# Patient Record
Sex: Female | Born: 1950
Health system: Southern US, Community
[De-identification: ages and names within clinical notes are randomized; demographics above are authoritative.]

## PROBLEM LIST (undated history)

## (undated) DIAGNOSIS — F419 Anxiety disorder, unspecified: Secondary | ICD-10-CM

## (undated) DIAGNOSIS — K760 Fatty (change of) liver, not elsewhere classified: Secondary | ICD-10-CM

## (undated) DIAGNOSIS — I440 Atrioventricular block, first degree: Secondary | ICD-10-CM

## (undated) DIAGNOSIS — R350 Frequency of micturition: Secondary | ICD-10-CM

## (undated) DIAGNOSIS — K8689 Other specified diseases of pancreas: Secondary | ICD-10-CM

## (undated) DIAGNOSIS — M1990 Unspecified osteoarthritis, unspecified site: Secondary | ICD-10-CM

## (undated) DIAGNOSIS — Z860101 Personal history of adenomatous and serrated colon polyps: Secondary | ICD-10-CM

## (undated) DIAGNOSIS — K219 Gastro-esophageal reflux disease without esophagitis: Secondary | ICD-10-CM

## (undated) DIAGNOSIS — Z87820 Personal history of traumatic brain injury: Secondary | ICD-10-CM

## (undated) DIAGNOSIS — I10 Essential (primary) hypertension: Secondary | ICD-10-CM

## (undated) DIAGNOSIS — Z87442 Personal history of urinary calculi: Secondary | ICD-10-CM

## (undated) DIAGNOSIS — I471 Supraventricular tachycardia, unspecified: Secondary | ICD-10-CM

## (undated) DIAGNOSIS — Z86010 Personal history of colonic polyps: Secondary | ICD-10-CM

## (undated) DIAGNOSIS — J45909 Unspecified asthma, uncomplicated: Secondary | ICD-10-CM

## (undated) DIAGNOSIS — R32 Unspecified urinary incontinence: Secondary | ICD-10-CM

## (undated) DIAGNOSIS — T7840XA Allergy, unspecified, initial encounter: Secondary | ICD-10-CM

## (undated) DIAGNOSIS — K862 Cyst of pancreas: Secondary | ICD-10-CM

## (undated) DIAGNOSIS — K863 Pseudocyst of pancreas: Secondary | ICD-10-CM

## (undated) DIAGNOSIS — Z9889 Other specified postprocedural states: Secondary | ICD-10-CM

## (undated) DIAGNOSIS — Z8673 Personal history of transient ischemic attack (TIA), and cerebral infarction without residual deficits: Secondary | ICD-10-CM

## (undated) DIAGNOSIS — K449 Diaphragmatic hernia without obstruction or gangrene: Secondary | ICD-10-CM

## (undated) DIAGNOSIS — R3915 Urgency of urination: Secondary | ICD-10-CM

## (undated) DIAGNOSIS — Z8719 Personal history of other diseases of the digestive system: Secondary | ICD-10-CM

## (undated) DIAGNOSIS — K649 Unspecified hemorrhoids: Secondary | ICD-10-CM

## (undated) DIAGNOSIS — N281 Cyst of kidney, acquired: Secondary | ICD-10-CM

## (undated) DIAGNOSIS — Z5189 Encounter for other specified aftercare: Secondary | ICD-10-CM

## (undated) DIAGNOSIS — Z87411 Personal history of vaginal dysplasia: Secondary | ICD-10-CM

## (undated) DIAGNOSIS — K625 Hemorrhage of anus and rectum: Secondary | ICD-10-CM

## (undated) HISTORY — DX: Unspecified urinary incontinence: R32

## (undated) HISTORY — DX: Allergy, unspecified, initial encounter: T78.40XA

## (undated) HISTORY — DX: Other specified diseases of pancreas: K86.89

## (undated) HISTORY — DX: Frequency of micturition: R35.0

## (undated) HISTORY — PX: CARDIAC ELECTROPHYSIOLOGY MAPPING AND ABLATION: SHX1292

## (undated) HISTORY — PX: OTHER SURGICAL HISTORY: SHX169

## (undated) HISTORY — DX: Encounter for other specified aftercare: Z51.89

## (undated) HISTORY — PX: SPINAL FUSION: SHX223

## (undated) HISTORY — PX: TRANSTHORACIC ECHOCARDIOGRAM: SHX275

## (undated) HISTORY — DX: Cyst of pancreas: K86.2

## (undated) HISTORY — PX: BREAST EXCISIONAL BIOPSY: SUR124

## (undated) HISTORY — DX: Fatty (change of) liver, not elsewhere classified: K76.0

---

## 1989-01-18 HISTORY — PX: NASAL SEPTUM SURGERY: SHX37

## 1994-01-18 HISTORY — PX: FOOT NEUROMA SURGERY: SHX646

## 1994-01-18 HISTORY — PX: VAGINAL HYSTERECTOMY: SUR661

## 1998-04-15 ENCOUNTER — Other Ambulatory Visit: Admission: RE | Admit: 1998-04-15 | Discharge: 1998-04-15 | Payer: Self-pay | Admitting: *Deleted

## 1998-06-17 ENCOUNTER — Other Ambulatory Visit: Admission: RE | Admit: 1998-06-17 | Discharge: 1998-06-17 | Payer: Self-pay | Admitting: Internal Medicine

## 1998-11-07 ENCOUNTER — Encounter: Payer: Self-pay | Admitting: Family Medicine

## 1998-11-07 ENCOUNTER — Ambulatory Visit (HOSPITAL_COMMUNITY): Admission: RE | Admit: 1998-11-07 | Discharge: 1998-11-07 | Payer: Self-pay | Admitting: Family Medicine

## 1999-02-05 ENCOUNTER — Encounter: Admission: RE | Admit: 1999-02-05 | Discharge: 1999-02-05 | Payer: Self-pay | Admitting: Family Medicine

## 1999-02-05 ENCOUNTER — Encounter: Payer: Self-pay | Admitting: Family Medicine

## 1999-02-11 ENCOUNTER — Encounter: Payer: Self-pay | Admitting: Family Medicine

## 1999-02-11 ENCOUNTER — Encounter: Admission: RE | Admit: 1999-02-11 | Discharge: 1999-02-11 | Payer: Self-pay | Admitting: Family Medicine

## 1999-05-21 ENCOUNTER — Other Ambulatory Visit: Admission: RE | Admit: 1999-05-21 | Discharge: 1999-05-21 | Payer: Self-pay | Admitting: *Deleted

## 2000-07-08 ENCOUNTER — Emergency Department (HOSPITAL_COMMUNITY): Admission: EM | Admit: 2000-07-08 | Discharge: 2000-07-08 | Payer: Self-pay | Admitting: Emergency Medicine

## 2000-07-08 ENCOUNTER — Encounter: Payer: Self-pay | Admitting: Emergency Medicine

## 2004-05-27 ENCOUNTER — Ambulatory Visit (HOSPITAL_BASED_OUTPATIENT_CLINIC_OR_DEPARTMENT_OTHER): Admission: RE | Admit: 2004-05-27 | Discharge: 2004-05-27 | Payer: Self-pay | Admitting: *Deleted

## 2004-05-31 ENCOUNTER — Ambulatory Visit: Payer: Self-pay | Admitting: Internal Medicine

## 2004-12-23 ENCOUNTER — Other Ambulatory Visit: Admission: RE | Admit: 2004-12-23 | Discharge: 2004-12-23 | Payer: Self-pay | Admitting: *Deleted

## 2005-02-22 ENCOUNTER — Encounter: Admission: RE | Admit: 2005-02-22 | Discharge: 2005-02-22 | Payer: Self-pay | Admitting: Emergency Medicine

## 2005-05-11 ENCOUNTER — Ambulatory Visit: Payer: Self-pay | Admitting: Gastroenterology

## 2005-05-17 ENCOUNTER — Ambulatory Visit: Payer: Self-pay | Admitting: Gastroenterology

## 2005-06-04 ENCOUNTER — Ambulatory Visit: Payer: Self-pay | Admitting: Gastroenterology

## 2005-06-16 ENCOUNTER — Ambulatory Visit (HOSPITAL_COMMUNITY): Admission: RE | Admit: 2005-06-16 | Discharge: 2005-06-16 | Payer: Self-pay | Admitting: Gastroenterology

## 2005-06-21 ENCOUNTER — Ambulatory Visit (HOSPITAL_COMMUNITY): Admission: RE | Admit: 2005-06-21 | Discharge: 2005-06-21 | Payer: Self-pay | Admitting: Surgery

## 2005-06-28 ENCOUNTER — Ambulatory Visit: Payer: Self-pay | Admitting: Gastroenterology

## 2005-09-01 ENCOUNTER — Ambulatory Visit (HOSPITAL_COMMUNITY): Admission: RE | Admit: 2005-09-01 | Discharge: 2005-09-03 | Payer: Self-pay | Admitting: Surgery

## 2006-11-23 ENCOUNTER — Ambulatory Visit (HOSPITAL_COMMUNITY): Admission: RE | Admit: 2006-11-23 | Discharge: 2006-11-23 | Payer: Self-pay | Admitting: Family Medicine

## 2006-12-06 ENCOUNTER — Encounter: Payer: Self-pay | Admitting: Internal Medicine

## 2007-03-14 DIAGNOSIS — K219 Gastro-esophageal reflux disease without esophagitis: Secondary | ICD-10-CM | POA: Insufficient documentation

## 2007-03-14 DIAGNOSIS — I10 Essential (primary) hypertension: Secondary | ICD-10-CM | POA: Insufficient documentation

## 2007-03-16 ENCOUNTER — Ambulatory Visit: Payer: Self-pay | Admitting: Internal Medicine

## 2007-03-16 DIAGNOSIS — E785 Hyperlipidemia, unspecified: Secondary | ICD-10-CM | POA: Insufficient documentation

## 2007-03-16 DIAGNOSIS — R05 Cough: Secondary | ICD-10-CM

## 2007-07-31 ENCOUNTER — Telehealth: Payer: Self-pay | Admitting: Gastroenterology

## 2007-07-31 DIAGNOSIS — Z860100 Personal history of colon polyps, unspecified: Secondary | ICD-10-CM | POA: Insufficient documentation

## 2007-07-31 DIAGNOSIS — K222 Esophageal obstruction: Secondary | ICD-10-CM

## 2007-07-31 DIAGNOSIS — K449 Diaphragmatic hernia without obstruction or gangrene: Secondary | ICD-10-CM | POA: Insufficient documentation

## 2007-08-01 ENCOUNTER — Ambulatory Visit: Payer: Self-pay | Admitting: Gastroenterology

## 2007-08-01 DIAGNOSIS — R197 Diarrhea, unspecified: Secondary | ICD-10-CM | POA: Insufficient documentation

## 2007-08-01 DIAGNOSIS — K625 Hemorrhage of anus and rectum: Secondary | ICD-10-CM

## 2007-08-01 LAB — CONVERTED CEMR LAB
ALT: 17 units/L (ref 0–35)
AST: 19 units/L (ref 0–37)
Albumin: 3.6 g/dL (ref 3.5–5.2)
BUN: 12 mg/dL (ref 6–23)
Basophils Relative: 0.7 % (ref 0.0–1.0)
Bilirubin, Direct: 0.1 mg/dL (ref 0.0–0.3)
CO2: 28 meq/L (ref 19–32)
Chloride: 105 meq/L (ref 96–112)
Creatinine, Ser: 0.8 mg/dL (ref 0.4–1.2)
Eosinophils Absolute: 0.3 10*3/uL (ref 0.0–0.7)
HCT: 39.5 % (ref 36.0–46.0)
Hemoglobin: 13.7 g/dL (ref 12.0–15.0)
Iron: 60 ug/dL (ref 42–145)
MCHC: 34.6 g/dL (ref 30.0–36.0)
MCV: 92.8 fL (ref 78.0–100.0)
Neutrophils Relative %: 53.1 % (ref 43.0–77.0)
RBC: 4.26 M/uL (ref 3.87–5.11)
Saturation Ratios: 19.7 % — ABNORMAL LOW (ref 20.0–50.0)
Total Bilirubin: 0.7 mg/dL (ref 0.3–1.2)
Total Protein: 6.1 g/dL (ref 6.0–8.3)
Vitamin B-12: 422 pg/mL (ref 211–911)
WBC: 6 10*3/uL (ref 4.5–10.5)

## 2007-08-03 ENCOUNTER — Encounter: Payer: Self-pay | Admitting: Gastroenterology

## 2007-08-04 ENCOUNTER — Encounter (INDEPENDENT_AMBULATORY_CARE_PROVIDER_SITE_OTHER): Payer: Self-pay | Admitting: *Deleted

## 2007-08-29 ENCOUNTER — Ambulatory Visit: Payer: Self-pay | Admitting: Gastroenterology

## 2008-09-06 ENCOUNTER — Ambulatory Visit (HOSPITAL_COMMUNITY): Admission: RE | Admit: 2008-09-06 | Discharge: 2008-09-06 | Payer: Self-pay | Admitting: Diagnostic Radiology

## 2009-05-14 ENCOUNTER — Emergency Department (HOSPITAL_COMMUNITY): Admission: EM | Admit: 2009-05-14 | Discharge: 2009-05-14 | Payer: Self-pay | Admitting: Emergency Medicine

## 2009-05-15 ENCOUNTER — Ambulatory Visit (HOSPITAL_COMMUNITY): Admission: RE | Admit: 2009-05-15 | Discharge: 2009-05-15 | Payer: Self-pay | Admitting: Emergency Medicine

## 2009-11-22 ENCOUNTER — Emergency Department (HOSPITAL_COMMUNITY): Admission: EM | Admit: 2009-11-22 | Discharge: 2009-11-22 | Payer: Self-pay | Admitting: Emergency Medicine

## 2009-11-22 ENCOUNTER — Encounter: Payer: Self-pay | Admitting: Internal Medicine

## 2009-12-17 ENCOUNTER — Encounter: Payer: Self-pay | Admitting: Internal Medicine

## 2009-12-18 ENCOUNTER — Encounter: Payer: Self-pay | Admitting: Internal Medicine

## 2009-12-30 ENCOUNTER — Encounter: Payer: Self-pay | Admitting: Internal Medicine

## 2010-01-13 ENCOUNTER — Encounter (INDEPENDENT_AMBULATORY_CARE_PROVIDER_SITE_OTHER): Payer: Self-pay | Admitting: *Deleted

## 2010-01-13 ENCOUNTER — Ambulatory Visit: Payer: Self-pay | Admitting: Internal Medicine

## 2010-01-13 ENCOUNTER — Encounter: Payer: Self-pay | Admitting: Internal Medicine

## 2010-01-13 DIAGNOSIS — I471 Supraventricular tachycardia: Secondary | ICD-10-CM

## 2010-01-14 ENCOUNTER — Ambulatory Visit
Admission: RE | Admit: 2010-01-14 | Discharge: 2010-01-14 | Payer: Self-pay | Source: Home / Self Care | Attending: Internal Medicine | Admitting: Internal Medicine

## 2010-01-14 LAB — CONVERTED CEMR LAB
Basophils Absolute: 0 10*3/uL (ref 0.0–0.1)
Basophils Relative: 0.8 % (ref 0.0–3.0)
Calcium: 8.8 mg/dL (ref 8.4–10.5)
Chloride: 107 meq/L (ref 96–112)
Eosinophils Absolute: 0.3 10*3/uL (ref 0.0–0.7)
Glucose, Bld: 81 mg/dL (ref 70–99)
HCT: 41.1 % (ref 36.0–46.0)
INR: 0.9 (ref 0.8–1.0)
MCHC: 33.8 g/dL (ref 30.0–36.0)
MCV: 95 fL (ref 78.0–100.0)
Monocytes Absolute: 0.6 10*3/uL (ref 0.1–1.0)
Neutrophils Relative %: 48.4 % (ref 43.0–77.0)
Platelets: 238 10*3/uL (ref 150.0–400.0)
Potassium: 4.1 meq/L (ref 3.5–5.1)
Prothrombin Time: 9.5 s — ABNORMAL LOW (ref 9.7–11.8)
RDW: 13.8 % (ref 11.5–14.6)

## 2010-01-15 ENCOUNTER — Encounter: Payer: Self-pay | Admitting: Internal Medicine

## 2010-01-18 DIAGNOSIS — Z87820 Personal history of traumatic brain injury: Secondary | ICD-10-CM

## 2010-01-18 HISTORY — DX: Personal history of traumatic brain injury: Z87.820

## 2010-01-21 ENCOUNTER — Ambulatory Visit (HOSPITAL_COMMUNITY)
Admission: RE | Admit: 2010-01-21 | Discharge: 2010-01-22 | Payer: Self-pay | Source: Home / Self Care | Attending: Internal Medicine | Admitting: Internal Medicine

## 2010-02-05 NOTE — Discharge Summary (Signed)
[name], Daniel NO.:  [id number]  MEDICAL RECORD NO.:  [id number]          PATIENT TYPE:  OIB  LOCATION:  6526                         FACILITY:  [facility]  PHYSICIAN:  Duke Salvia, MD, FACCDATE OF BIRTH:  Aug 08, 1950  DATE OF ADMISSION:  01/21/2010 DATE OF DISCHARGE:  01/22/2010                              DISCHARGE SUMMARY   PRIMARY CARE PHYSICIAN:  Brett Canales A. Cleta Alberts, MD  PRIMARY CARDIOLOGIST:  Cristy Hilts. Jacinto Halim, MD  ELECTROPHYSIOLOGIST:  Duke Salvia, MD, [facility]  PRIMARY DIAGNOSIS:  Supraventricular tachycardia.  SECONDARY DIAGNOSES: 1. History of colon polyp. 2. Esophageal stricture. 3. Hiatal hernia. 4. Hyperlipidemia. 5. Hypertension. 6. Gastroesophageal disease.  ALLERGIES:  The patient has no known drug allergies.  PROCEDURES THIS ADMISSION:  Electrophysiology study and radiofrequency catheter ablation of AV nodal reentrant tachycardia by Dr. Graciela Husbands on January 21, 2010.  The study demonstrated inducible AV nodal reentrant tachycardia with successful slow pathway modification performed.  The patient had no inducible arrhythmias postablation and had no early apparent complications.  BRIEF HISTORY OF PRESENT ILLNESS:  Pamela Daniel is a 60 year old female with an 8 year history of recurrent abrupt onset of tachy palpitations.  These have been associated with presyncope and shortness of breath, but no chest discomfort.  They are FROG positive. She has in the past been unable to terminate them with Valsalva.  She was evaluated by Dr. Graciela Husbands in the outpatient setting for treatment options.  Her arrhythmias have not previously been suppressed adequately with AV nodal blocking agents.  Catheter ablation was discussed with the patient.  Risks, benefits, and alternatives were reviewed and the patient wished to proceed.  HOSPITAL COURSE:  The patient was admitted on January 21, 2010, for planned ablation of supraventricular tachycardia.  This was carried  out by Dr. Graciela Husbands with details as outlined above.  The patient was monitored on telemetry overnight, which demonstrated sinus rhythm.  The groin incision on the morning of the procedure was without hematoma or bruit. The patient's Bystolic was discontinued after her ablation.  Dr. Graciela Husbands examined the patient and considered her stable for discharge.  FOLLOWUP APPOINTMENTS: 1. Dr. Graciela Husbands on March 16, 2010. 2. Dr. Jacinto Halim as scheduled. 3. Dr. Cleta Alberts as scheduled.  DISCHARGE INSTRUCTIONS: 1. Increase activity slowly. 2. No driving for 2 days. 3. Follow a low-sodium, heart-healthy diet. 4. Keep her incision clean and dry.  DISCHARGE MEDICATIONS: 1. Prilosec 20 mg daily. 2. Citalopram 20 mg daily. 3. Xanax 0.25 mg one-half tablet as needed. 4. Flexeril 10 mg as needed.  Of note, patient's Bystolic was discontinued this admission secondary to successful ablation of AVNRT.  DISPOSITION:  The patient was seen and examined by Dr. Graciela Husbands on January 22, 2010, and considered stable for discharge.  DURATION OF DISCHARGE ENCOUNTER:  Thirty-five minutes.     Gypsy Balsam, RN,BSN   ______________________________ Duke Salvia, MD, [facility]    AS/MEDQ  D:  01/22/2010  T:  01/23/2010  Job:  [id number]  cc:   Cristy Hilts. Jacinto Halim, MD [place] Cleta Alberts, M.D.  Electronically Signed by Gypsy Balsam RNBSN on 01/25/2010 05:56:14 PM Electronically Signed by Sherryl Manges  MD [facility] on 02/05/2010 01:40:12 PM

## 2010-02-08 ENCOUNTER — Encounter: Payer: Self-pay | Admitting: Family Medicine

## 2010-02-19 NOTE — Assessment & Plan Note (Signed)
Summary: eval for ablation/mt   Visit Type:  NP Primary Joshu Furukawa:  Ailene Ards MD  CC:  no complaints.  History of Present Illness: Mrs. Keniston is seen at the request of Dr. Jacinto Halim for consideration of catheter ablation of documented supraventricular tachycardia.  The patient has an 8 year history of recurrent abrupt onset/offset tachypalpitations. They're associated with presyncope shortness of breath but no chest discomfort. They are frog positive. She is able to terminate them with Valsalva. They last typically 2-15 minutes. One of these was documented with a heart rate of 300 ms or so.  She is currently taking diastolic but has taken atenolol and metoprolol in the past.  She had an extensive cardiac workup including ultrasound and stress tests under the care of Dr. Dorma Russell which were normal. She also underwent a recent echo by Dr. Ottis Stain that was normal.  She has long-standing exercise intolerance and daytime somnolence. She has very poor sleep history. Apparently she underwent sleep study a couple of years ago that was unrevealing.  Problems Prior to Update: 1)  Svt/ Psvt/ Pat  (ICD-427.0) 2)  Rectal Bleeding  (ICD-569.3) 3)  Diarrhea  (ICD-787.91) 4)  Carcinoma, Colon, Family Hx  (ICD-V16.0) 5)  Colonic Polyps, Hx of  (ICD-V12.72) 6)  Esophageal Stricture  (ICD-530.3) 7)  Hiatal Hernia  (ICD-553.3) 8)  Hyperlipidemia  (ICD-272.4) 9)  Cough  (ICD-786.2) 10)  Hypertension  (ICD-401.9) 11)  G E R D  (ICD-530.81)  Current Medications (verified): 1)  Prilosec 20 Mg Cpdr (Omeprazole) .... Once Daily 2)  Bystolic 5 Mg Tabs (Nebivolol Hcl) .... Once Daily 3)  Citalopram Hydrobromide 20 Mg Tabs (Citalopram Hydrobromide) .... Once Daily 4)  Xanax 0.25 Mg Tabs (Alprazolam) .... 1/2 Tablet As Needed 5)  Flexeril 10 Mg Tabs (Cyclobenzaprine Hcl) .... As Needed  Allergies (verified): No Known Drug Allergies  Past History:  Past Surgical History: Last updated: 08/01/2007 status post  remote sinus surgery in 1991 Status post hysterectomy 1996 Status post lumpectomy benign remote NF 2007 anal fissurectomy Tubal Ligation previous fundoplication and subsequent microscopic takedown repair of in August of 2007 by Dr. Daphine Deutscher.  Family History: Last updated: 08/01/2007 emphysema father, chewed tobacco allergies mother, lots of coughing and sinus  cancer 3 brother lung, bone colon Family History of Colon Cancer: brother,mother Family History of Diabetes: grandfather Family History of Heart Disease: PSVT, mother  Social History: Last updated: 08/01/2007 never smoked Occupation: Engineer, site Patient has never smoked.  Alcohol Use - yes Daily Caffeine Use Illicit Drug Use - no  Risk Factors: Alcohol Use: <1 (08/01/2007) Caffeine Use: 2 (08/01/2007)  Risk Factors: Smoking Status: never (08/01/2007)  Past Medical History: G E R D positive endoscopy 11/23/94 Dr. Russella Dar Hypertension Hyperlipidemia depression Verne Spurr disturbance  Review of Systems       full review of systems was negative apart from a history of present illness and past medical history.   Vital Signs:  Patient profile:   60 year old female Height:      60 inches Weight:      163.25 pounds BMI:     32.00 Pulse rate:   64 / minute BP sitting:   122 / 78  (left arm) Cuff size:   regular  Vitals Entered By: Caralee Ates CMA (January 13, 2010 12:13 PM)  Physical Exam  General:  Alert and oriented  obese middle the older Caucasian female appearing her stated age in no acute distress. HEENT  normal . Neck veins were flat; carotids  brisk and full without bruits. No lymphadenopathy. Back without kyphosis. Lungs clear. Heart sounds regular without murmurs or gallops. PMI nondisplaced. Abdomen soft with active bowel sounds without midline pulsation or hepatomegaly. Femoral pulses and distal pulses intact. Extremities were without clubbing cyanosis or edemaSkin warm and dry. Neurological exam  grossly normal; affect normal    Impression & Recommendations:  Problem # 1:  SVT/ PSVT/ PAT (ICD-427.0) the patient has documented supraventricular tachycardia probably AV nodal reentry by history. We have discussed treatment options including antiarrhythmic drugs alternative AV nodal blocking agents as well as catheter ablation. We discussed the potential benefits as well as risks including but not limited to death perforation and heart block requiring pacemaker implantation. She understands these risks and would like to proceed.  The following medications were removed from the medication list:    Toprol Xl 25 Mg Tb24 (Metoprolol succinate) .Marland Kitchen... Take one daily Her updated medication list for this problem includes:    Bystolic 5 Mg Tabs (Nebivolol hcl) ..... Once daily  Patient Instructions: 1)  Your physician recommends that you continue on your current medications as directed. Please refer to the Current Medication list given to you today. 2)  Your physician has recommended that you have an ablation.  Catheter ablation is a medical procedure used to treat some cardiac arrhythmias (irregular heartbeats). During catheter ablation, a long, thin, flexible tube is put into a blood vessel in your groin (upper thigh), or neck. This tube is called an ablation catheter. It is then guided to your heart through the blood vessel. Radiofrequency waves destroy small areas of heart tissue where abnormal heartbeats may cause an arrhythmia to start.  Please see the instruction sheet given to you today.

## 2010-02-19 NOTE — Letter (Signed)
Summary: ELectrophysiology/Ablation Procedure Instructions  [employer], Main Office  [address]   [place], [place] [address]   Phone: [phone]  Fax: [phone]     Electrophysiology/Ablation Procedure Instructions    You are scheduled for a(n) ablation  on January 21, 2010 at 1030 am with Dr. Graciela Husbands.  1.  Please come to the Short Stay Center at [facility] at 8:30 am on the day of your procedure.  2.  Come prepared to stay overnight.   Please bring your insurance cards and a list of your medications.  3.  Come to the [place] office on January 14, 2010 at 8:30am for lab work.  The lab at [facility] is open from 8:30 AM to 1:30 PM and 2:30 PM to 5:00 PM.  The lab at [facility] is open from 7:30 AM to 5:30 PM.  You do not have to be fasting.  4.  Do not have anything to eat or drink after midnight the night before your procedure.  5.  Take  your medications with a small amount of water.  6.  Educational material received:  _x____ Ablation   * Occasionally, EP studies and ablations can become lengthy.  Please make your family aware of this before your procedure starts.  Average time ranges from 2-8 hours for EP studies/ablations.  Your physician will locate your family after the procedure with the results.  * If you have any questions after you get home, please call the office at [phone].  Claris Gladden, RN

## 2010-02-19 NOTE — Letter (Signed)
Summary: [facility]  [facility]   Imported By: Marylou Mccoy 02/10/2010 16:15:08  _____________________________________________________________________  External Attachment:    Type:   Image     Comment:   External Document

## 2010-03-16 ENCOUNTER — Encounter: Payer: Self-pay | Admitting: Internal Medicine

## 2010-03-17 ENCOUNTER — Encounter: Payer: Self-pay | Admitting: Internal Medicine

## 2010-03-31 LAB — POCT CARDIAC MARKERS: Myoglobin, poc: 49.4 ng/mL (ref 12–200)

## 2010-03-31 LAB — BASIC METABOLIC PANEL
CO2: 30 mEq/L (ref 19–32)
GFR calc Af Amer: >60 mL/min (ref >60)
Glucose, Bld: 102 mg/dL — ABNORMAL HIGH (ref 70–99)

## 2010-03-31 LAB — DIFFERENTIAL
Basophils Relative: 1 % (ref 0–1)
Eosinophils Absolute: 0.3 10*3/uL (ref 0.0–0.7)
Lymphocytes Relative: 38 % (ref 12–46)
Monocytes Absolute: 0.5 10*3/uL (ref 0.1–1.0)
Neutrophils Relative %: 49 % (ref 43–77)

## 2010-03-31 LAB — URINALYSIS, ROUTINE W REFLEX MICROSCOPIC
Bilirubin Urine: NEGATIVE
Ketones, ur: NEGATIVE mg/dL
Nitrite: NEGATIVE
Specific Gravity, Urine: 1.011 (ref 1.005–1.030)
Urobilinogen, UA: 0.2 mg/dL (ref 0.0–1.0)
pH: 6.5 (ref 5.0–8.0)

## 2010-03-31 LAB — CBC
Hemoglobin: 14.8 g/dL (ref 12.0–15.0)
MCHC: 33.5 g/dL (ref 30.0–36.0)
Platelets: 221 10*3/uL (ref 150–400)
RBC: 4.7 MIL/uL (ref 3.87–5.11)
RDW: 13 % (ref 11.5–15.5)

## 2010-04-07 LAB — POCT I-STAT, CHEM 8
BUN: 10 mg/dL (ref 6–23)
Calcium, Ion: 1.13 mmol/L (ref 1.12–1.32)
Chloride: 107 meq/L (ref 96–112)
Creatinine, Ser: 0.6 mg/dL (ref 0.4–1.2)
Glucose, Bld: 88 mg/dL (ref 70–99)
HCT: 43 % (ref 36.0–46.0)
Hemoglobin: 14.6 g/dL (ref 12.0–15.0)
Potassium: 4 meq/L (ref 3.5–5.1)
Sodium: 141 meq/L (ref 135–145)
TCO2: 28 mmol/L (ref 0–100)

## 2010-04-07 LAB — POCT CARDIAC MARKERS
CKMB, poc: <1 ng/mL — ABNORMAL LOW (ref 1.0–8.0)
Myoglobin, poc: 38.4 ng/mL (ref 12–200)
Troponin i, poc: <0.05 ng/mL (ref 0.00–0.09)

## 2010-04-07 LAB — D-DIMER, QUANTITATIVE: D-Dimer, Quant: <0.22 ug/mL-FEU (ref 0.00–0.48)

## 2010-05-28 ENCOUNTER — Emergency Department (HOSPITAL_COMMUNITY)
Admission: EM | Admit: 2010-05-28 | Discharge: 2010-05-28 | Disposition: A | Discharge status: Home / Self Care | Payer: 59 | Attending: Orthopedic Surgery | Admitting: Orthopedic Surgery

## 2010-05-28 DIAGNOSIS — F411 Generalized anxiety disorder: Secondary | ICD-10-CM | POA: Insufficient documentation

## 2010-05-28 DIAGNOSIS — I10 Essential (primary) hypertension: Secondary | ICD-10-CM | POA: Insufficient documentation

## 2010-05-28 DIAGNOSIS — R5381 Other malaise: Secondary | ICD-10-CM | POA: Insufficient documentation

## 2010-05-28 DIAGNOSIS — J45909 Unspecified asthma, uncomplicated: Secondary | ICD-10-CM | POA: Insufficient documentation

## 2010-05-28 DIAGNOSIS — R002 Palpitations: Secondary | ICD-10-CM | POA: Insufficient documentation

## 2010-05-28 DIAGNOSIS — Z79899 Other long term (current) drug therapy: Secondary | ICD-10-CM | POA: Insufficient documentation

## 2010-05-28 LAB — POCT CARDIAC MARKERS
CKMB, poc: <1 ng/mL — ABNORMAL LOW (ref 1.0–8.0)
Troponin i, poc: <0.05 ng/mL (ref 0.00–0.09)

## 2010-05-28 LAB — POCT I-STAT, CHEM 8
BUN: 7 mg/dL (ref 6–23)
Calcium, Ion: 1.15 mmol/L (ref 1.12–1.32)
Chloride: 106 mEq/L (ref 96–112)
Glucose, Bld: 93 mg/dL (ref 70–99)
HCT: 44 % (ref 36.0–46.0)
Potassium: 4.1 mEq/L (ref 3.5–5.1)

## 2010-06-05 NOTE — Procedures (Signed)
NAME:  Pamela Daniel, Pamela Daniel             ACCOUNT NO.:  [id number]   MEDICAL RECORD NO.:  [id number]          PATIENT TYPE:  OUT   LOCATION:  SLEEP CENTER                 FACILITY:  [facility]   PHYSICIAN:  Clinton D. Maple Hudson, M.D. DATE OF BIRTH:  [birth year]   DATE OF STUDY:  05/27/2004                              NOCTURNAL POLYSOMNOGRAM   REFERRING PHYSICIAN:  Onalee Hua L. Reed Breech, M.D.   INDICATIONS FOR STUDY:  Hypersomnia with sleep apnea. Epworth sleepiness  score 11/24, BMI 31, weight 161 pounds.   SLEEP ARCHITECTURE:  Total sleep time 489 minutes with sleep efficiency of  87%. Stage I was 2%, stage II was 69%, stages III and IV were absent, REM  was 16% of total sleep time. Sleep latency was 17 minutes. REM latency 158  minutes. Awake after sleep onset 49 minutes. Arousal index 5.   RESPIRATORY DATA:  Respiratory disturbance index (RDI, AHI) 4.6 obstructive  events per hour which is within the upper limits of adult normal (5 per  hour). There was 1 obstructive apnea, 2 mixed apneas, and 31 hypopneas. The  events were not positional. REM RDI 8.6. There were insufficient events to  permit use of CPAP titration by protocol  on this study night.   OXYGEN DATA:  Light to moderate snoring with oxygen desaturation to a nadir  of 82%.  Mean oxygen saturation through this study was 94% on room air.   CARDIAC DATA:  Normal cardiac rhythm.   MOVEMENT/PARASOMNIA:  Rare leg jerk with insignificant effect on sleep.  Several bathroom trips.   IMPRESSION/RECOMMENDATIONS:  1.  Several brief, but nonspecific awakenings and nocturia.  2.  Occasional sleep disordered breathing events, RDI of 4.6 per hour,      within the upper limits of normal per adult. No specific therapy      indicated.      CDY/MEDQ  D:  05/31/2004 14:05:03  T:  05/31/2004 19:26:04  Job:  [id number]

## 2010-06-05 NOTE — Op Note (Signed)
NAMEMarland Daniel  RALEY, NOVICKI NO.:  [id number]   MEDICAL RECORD NO.:  [id number]          PATIENT TYPE:  OUT   LOCATION:  XRAY                         FACILITY:  [facility]   PHYSICIAN:  Vania Rea. Jarold Motto, M.D. [facility] OF BIRTH:  Dec 06, 1950   DATE OF PROCEDURE:  06/28/2005  DATE OF DISCHARGE:  06/21/2005                                 OPERATIVE REPORT   PROCEDURE PERFORMED:  Esophageal manometry.   1.  Upper esophageal sphincter.  There is normal coordination between      pharyngeal contractions and cricopharyngeal relaxation.  2.  Lower esophageal sphincter pressure.  Mean pressure is 15.6 mmHg with      normal relaxation with swallowing.  Normal in our lab is 15 to 35 mmHg.  3.  Motility.  There area normal peristaltic waves throughout the length of      the esophagus to wet and dry swallows.  Mean amplitude of contractions      is normal at 117 mmHg.   ASSESSMENT:  This is a  normal esophageal manometry except for borderline  incompetent lower esophageal sphincter.  I see no contraindication to  fundoplication surgery because of her refractory reflux symptoms and a  history of peptic stricture of her esophagus.      Vania Rea. Jarold Motto, M.D. [facility]  Electronically Signed     DRP/MEDQ  D:  06/28/2005  T:  06/28/2005  Job:  [id number]   cc:   Thornton Park. Daphine Deutscher, MD  [address]  [place]  [place] [address]

## 2010-06-05 NOTE — Op Note (Signed)
Pamela Daniel, Pamela Daniel             ACCOUNT NO.:  [id number]   MEDICAL RECORD NO.:  [id number]          PATIENT TYPE:  OIB   LOCATION:  0098                         FACILITY:  [facility]   PHYSICIAN:  Thornton Park. Daphine Deutscher, MD  DATE OF BIRTH:  02-23-1950   DATE OF PROCEDURE:  09/01/2005  DATE OF DISCHARGE:                                 OPERATIVE REPORT   PREOPERATIVE DIAGNOSIS:  Gastroesophageal reflux with sliding hiatal hernia.   POSTOPERATIVE DIAGNOSIS:  Gastroesophageal reflux with sliding hiatal  hernia.   PROCEDURE:  Laparoscopic takedown and repair of hiatal hernia and Nissen  fundoplication.   SURGEON:  Luretha Murphy, M.D.   ASSISTANT:  Marca Ancona, M.D.   ANESTHESIA:  General endotracheal.   DESCRIPTION OF PROCEDURE:  Pamela Daniel was taken to room 1 on Wednesday,  09/01/2005 given general anesthesia.  The abdomen was prepped with  chlorhexidine and draped sterilely.  Access to the abdomen was gained  through the left upper quadrant using OptiVu technique without difficulty.  The abdomen was insufflated and the usual trocar placement was made to allow  for 5 mm upper midline for the [facility] liver retractor and two 11s on the  right side and one below the umbilicus.  There were no adhesions we  encountered there in the front from her previous tubal ligation.   The esophagogastric junction was exposed and you could see a little hole  went up into the chest where there was a small sliding hiatal hernia.  I  went to the pars flaccida region with a Harmonic scalpel and went across  anteriorly and then to the left side and the left crus.   Next, I took down short gastrics and carried this up to the left  hemidiaphragm.  I worked from behind, preserving the posterior vagus and  freed up the distal portion of the esophagus.  With a Penrose around the  stomach I was able to mobilize the esophagus and get it down in the abdomen.  With that down in the abdomen, I repaired the  diaphragm with two suture  closure using pledgeted sutures of  Surgidac in the auto suture device.  This closed this nicely.  Following this I then passed a lighted 50 bougie  to make sure it was a nice snug closure of the hiatus.  I withdrew that  momentarily and brought around a wrap of the upper portion of the stomach.  There was also a little area that had a little serosal tear that I oversewed  with a simple suture and it was up near the cardia.  With the wrap pulled  behind, I then found a spot on the patient's left side that was contiguous  and did a shoe shine maneuver.  I then passed the lighted 50 back down into  the stomach and using that as a guide, I sutured the wrap with three sutures  using intracorporeal ties using the Surgidac.  A clip was placed on the  middle suture.  The patient seemed to tolerate this procedure well.  There  was very little bleeding.  The abdomen was surveyed  and there were no other  bleeding  areas disturbed.  Port sites were injected with 0.5% Marcaine.  The wounds  were closed with 4-0 Vicryl with benzoin and Steri-Strips.  The patient  tolerated the procedure well and was taken to the recovery room in  satisfactory condition.      Thornton Park Daphine Deutscher, MD  Electronically Signed     MBM/MEDQ  D:  09/01/2005  T:  09/01/2005  Job:  [id number]   cc:   Onalee Hua L. Reed Breech, M.D.  Fax: [phone]   Vania Rea. Jarold Motto, MD, FACG, FACP  [address]  [place]  [place] [address]

## 2010-10-27 LAB — BUN: BUN: 9

## 2010-10-27 LAB — CREATININE, SERUM
Creatinine, Ser: 0.79
GFR calc non Af Amer: >60

## 2010-12-30 ENCOUNTER — Ambulatory Visit (INDEPENDENT_AMBULATORY_CARE_PROVIDER_SITE_OTHER): Payer: 59

## 2010-12-30 DIAGNOSIS — R05 Cough: Secondary | ICD-10-CM

## 2010-12-30 DIAGNOSIS — R0602 Shortness of breath: Secondary | ICD-10-CM

## 2011-01-22 ENCOUNTER — Other Ambulatory Visit (INDEPENDENT_AMBULATORY_CARE_PROVIDER_SITE_OTHER): Payer: 59

## 2011-01-22 DIAGNOSIS — D7289 Other specified disorders of white blood cells: Secondary | ICD-10-CM

## 2011-04-22 ENCOUNTER — Ambulatory Visit: Payer: 59

## 2011-04-22 ENCOUNTER — Ambulatory Visit (INDEPENDENT_AMBULATORY_CARE_PROVIDER_SITE_OTHER): Payer: 59 | Admitting: Family Medicine

## 2011-04-22 VITALS — BP 137/83 | HR 72 | Temp 97.5°F | Resp 16 | Ht 60.0 in | Wt 173.0 lb

## 2011-04-22 DIAGNOSIS — S060X0A Concussion without loss of consciousness, initial encounter: Secondary | ICD-10-CM

## 2011-04-22 DIAGNOSIS — S069X0A Unspecified intracranial injury without loss of consciousness, initial encounter: Secondary | ICD-10-CM

## 2011-04-22 DIAGNOSIS — S0990XA Unspecified injury of head, initial encounter: Secondary | ICD-10-CM

## 2011-04-22 NOTE — Progress Notes (Signed)
Patient ID: Pamela Daniel MRN: [id number], DOB: October 16, 1950, 61 y.o. Date of Encounter: 04/22/2011, 10:24 AM  Primary Physician: No primary provider on file.  Chief Complaint: Steel pipe feel on head 04/21/11  HPI: 61 y.o. year old female with history below presents with head injury. Patient states that a steel pipe fell from a height about 6 feet above her the previous day hit her along the posterior right parietal region of her head. Immediately after impact she had some pain and had some nausea. At that time she took some Motrin and placed ice on the injury. She has not had a headache through out the course, but her nausea has persisted, without emesis. Since this injury she has felt sleepy, "like I just can't keep my eyes open." No changes in hearing, taste, or smell. No paresthesias. No neck pain. No prior head injury or trauma. No slurred speech.    No past medical history on file.   Home Meds: Prior to Admission medications   Medication Sig Start Date End Date Taking? Authorizing Provider  ALPRAZolam (XANAX) 0.25 MG tablet Take 0.25 mg by mouth as needed.   Yes Historical Provider, MD  nebivolol (BYSTOLIC) 5 MG tablet Take 5 mg by mouth daily.   Yes Historical Provider, MD  omeprazole (PRILOSEC) 20 MG capsule Take 20 mg by mouth as needed.   Yes Historical Provider, MD    Allergies: No Known Allergies  History   Social History  . Marital Status: Divorced    Spouse Name: N/A    Number of Children: N/A  . Years of Education: N/A   Occupational History  . Not on file.   Social History Main Topics  . Smoking status: Never Smoker   . Smokeless tobacco: Not on file  . Alcohol Use: Not on file  . Drug Use: Not on file  . Sexually Active: Not on file   Other Topics Concern  . Not on file   Social History Narrative  . No narrative on file     Review of Systems: Constitutional: negative for chills, fever, night sweats, weight changes, or fatigue  HEENT: negative for  vision changes, hearing loss, congestion, rhinorrhea, ST, epistaxis, or sinus pressure Cardiovascular: negative for chest pain or palpitations Respiratory: negative for hemoptysis, wheezing, shortness of breath, or cough Abdominal: negative for abdominal pain, vomiting, diarrhea, or constipation Dermatological: negative for rash Neurologic: negative for headache, dizziness, or syncope All other systems reviewed and are otherwise negative with the exception to those above and in the HPI.   Physical Exam: Blood pressure 137/83, pulse 72, temperature 97.5 F (36.4 C), temperature source Oral, resp. rate 16, height 5' (1.524 m), weight 173 lb (78.472 kg)., Body mass index is 33.79 kg/(m^2). General: Well developed, well nourished, in no acute distress. Head: Normocephalic, atraumatic, eyes without discharge, sclera non-icteric, nares are without discharge. Bilateral auditory canals clear, TM's are without perforation, pearly grey and translucent with reflective cone of light bilaterally. Oral cavity moist, posterior pharynx without exudate, erythema, peritonsillar abscess, or post nasal drip. mild TTP posterior right parietal region, without crepitus. No STS or laceration.  Neck: Supple. No thyromegaly. Full ROM. No lymphadenopathy. Lungs: Clear bilaterally to auscultation without wheezes, rales, or rhonchi. Breathing is unlabored. Heart: RRR with S1 S2. No murmurs, rubs, or gallops appreciated. Msk:  Strength and tone normal for age. FROM 5/5 strength through out. Extremities/Skin: Warm and dry. No clubbing or cyanosis. No edema. No rashes or suspicious lesions. Neuro: Alert and  oriented X 3. Moves all extremities spontaneously. Gait is normal.  CNII-XII grossly in tact. Finger-nose intact, cerebellar function intact, normal sensation throughout, DTR 2+ through out. Rhomberg normal. Psych:  Responds to questions appropriately with a normal affect.   [facility] reading (PRIMARY) by  Dr.  Neva Seat. negative   ASSESSMENT AND PLAN:  61 y.o. year old female with concussion secondary to head injury -Concussion precaution -No work or activities until symptoms resolved -If symptoms worsen plan to CT head to r/o bleed -Patient has been instructed to have a family member stay with her over the next 24 hours a dn wake her every 4 hours to check on her -She is to call with an update -Patient discussed with and examined by Dr. Neva Seat  Signed, Eula Listen, PA-C 04/22/2011 10:24 AM

## 2011-04-26 NOTE — Progress Notes (Signed)
Patient called on 04/23/11 and stated that she was feeling much better. Sleepiness had considerably improved, and that she did fine overnight. She will follow up if symptoms do not fully resolve.  Eula Listen, PA-C 04/26/2011 7:50 AM

## 2011-08-07 ENCOUNTER — Ambulatory Visit (INDEPENDENT_AMBULATORY_CARE_PROVIDER_SITE_OTHER): Payer: 59 | Admitting: Physician Assistant

## 2011-08-07 VITALS — BP 132/80 | HR 79 | Temp 98.2°F | Resp 17 | Ht 60.5 in | Wt 171.0 lb

## 2011-08-07 DIAGNOSIS — K644 Residual hemorrhoidal skin tags: Secondary | ICD-10-CM

## 2011-08-07 MED ORDER — HYDROCORTISONE 2.5 % RE CREA: TOPICAL_CREAM | Freq: Two times a day (BID) | RECTAL | Status: AC

## 2011-08-07 NOTE — Progress Notes (Signed)
  Subjective:    Patient ID: Pamela Daniel, female    DOB: [birth year], 61 y.o.   MRN: [id number]  HPI Pamela Daniel comes in today concerned about hemorrhoids.  She has a long history of this problem but feels that her hemorrhoids are flaring up now for 1 week.  She reports that occasionally they will flare if she has several loose BMs per day over several days.  This has happened recently and is now slightly constipated.  She has had a colonoscopy that revealed polyps.  She is using OTC hemorrhoid cream with minimal relief. She has never had any surgical procedures for this problem.  She notes that she has had rectal bleeding with this flare up, swelling and pain.  She does feel that they reduce but are more prominent.    Review of Systems As noted in HPI, otherwise negative     Objective:   Physical Exam  Constitutional: Vital signs are normal. She appears well-developed and well-nourished.  Genitourinary: Rectal exam shows external hemorrhoid and tenderness.       Multiple hemorrhoid tags noted with several small prolapsed external hemorrhoids.  No fissures noted.    Skin: Skin is warm.          Assessment & Plan:  External Hemorrhoids  Continue Sitz Bath Anusol HC 2.5% Cream bid-tid Miralax and Colace High Fiber diet If worsens, RTC

## 2011-08-31 ENCOUNTER — Other Ambulatory Visit: Payer: Self-pay | Admitting: *Deleted

## 2011-08-31 MED ORDER — ALPRAZOLAM 0.25 MG PO TABS: ORAL_TABLET | ORAL | Status: DC

## 2011-10-05 ENCOUNTER — Ambulatory Visit (INDEPENDENT_AMBULATORY_CARE_PROVIDER_SITE_OTHER): Payer: 59 | Admitting: Family Medicine

## 2011-10-05 VITALS — BP 128/68 | HR 89 | Temp 97.8°F | Resp 16 | Ht 61.0 in | Wt 170.0 lb

## 2011-10-05 DIAGNOSIS — R109 Unspecified abdominal pain: Secondary | ICD-10-CM

## 2011-10-05 DIAGNOSIS — N10 Acute tubulo-interstitial nephritis: Secondary | ICD-10-CM

## 2011-10-05 LAB — POCT URINALYSIS DIPSTICK
Glucose, UA: NEGATIVE
Nitrite, UA: NEGATIVE
Protein, UA: 30
Spec Grav, UA: 1.025
Urobilinogen, UA: 1

## 2011-10-05 LAB — POCT UA - MICROSCOPIC ONLY
Casts, Ur, LPF, POC: NEGATIVE
Crystals, Ur, HPF, POC: NEGATIVE
Mucus, UA: POSITIVE
Yeast, UA: NEGATIVE

## 2011-10-05 MED ORDER — CIPROFLOXACIN HCL 500 MG PO TABS: 500.0000 mg | ORAL_TABLET | Freq: Two times a day (BID) | ORAL | Status: AC

## 2011-10-05 MED ORDER — HYDROCODONE-ACETAMINOPHEN 5-325 MG PO TABS: 1.0000 | ORAL_TABLET | Freq: Four times a day (QID) | ORAL | Status: DC | PRN

## 2011-10-05 MED ORDER — PROMETHAZINE HCL 25 MG PO TABS: 25.0000 mg | ORAL_TABLET | Freq: Three times a day (TID) | ORAL | Status: DC | PRN

## 2011-10-05 MED ORDER — CEFTRIAXONE SODIUM 1 G IJ SOLR
1.0000 g | Freq: Once | INTRAMUSCULAR | Status: AC
  Administered 2011-10-05: 1 g via INTRAMUSCULAR

## 2011-10-05 NOTE — Progress Notes (Signed)
  Subjective:    Patient ID: Pamela Daniel, female    DOB: Feb 09, 1950, 61 y.o.   MRN: [id number]  HPI   Started Fri in epigastric and radiating through back and now bilateral lower, then stopped urinating and tremendous pain and now fever/chills - 101 over wkend on ibuprofen which helps w/ fever and pain. Drinking water 2-3 glasses/d but still w/ minimal urination. Is having nausea and back pain still. Is not having dysuria, no h/o kidney stones, no h/o UTIs, decreased frequency, not able to empty bladder completely, no urge, still w/ lower bilateral lower abd pain. No prob w/ bowels.  Does take baths, but no douching, change in sexual actitvity,   Review of Systems  Constitutional: Positive for fever, chills, diaphoresis and fatigue.  Gastrointestinal: Positive for nausea and abdominal pain. Negative for vomiting, diarrhea and constipation.  Genitourinary: Positive for hematuria, flank pain, decreased urine volume, difficulty urinating and pelvic pain. Negative for dysuria, urgency, frequency, vaginal bleeding, vaginal discharge and menstrual problem.  Musculoskeletal: Positive for back pain.       Objective:   Physical Exam  Vitals reviewed. Constitutional: She is oriented to person, place, and time. She appears well-developed and well-nourished. She appears ill.  Neck: Normal range of motion. Neck supple. No thyromegaly present.  Cardiovascular: Normal rate, regular rhythm and normal heart sounds.   Pulmonary/Chest: Effort normal and breath sounds normal. No respiratory distress.  Abdominal: Soft. Bowel sounds are normal. She exhibits no distension and no mass. There is no hepatosplenomegaly. There is tenderness in the right upper quadrant, right lower quadrant, suprapubic area and left lower quadrant. There is guarding and CVA tenderness. There is no rigidity and no rebound.  Lymphadenopathy:    She has no cervical adenopathy.  Neurological: She is alert and oriented to person, place,  and time.  Skin: Skin is warm and dry. She is not diaphoretic.  Psychiatric: She has a normal mood and affect.     Results for orders placed in visit on 10/05/11  POCT UA - MICROSCOPIC ONLY      Component Value Range   WBC, Ur, HPF, POC 10-15     RBC, urine, microscopic 0-4     Bacteria, U Microscopic 1+     Mucus, UA positive     Epithelial cells, urine per micros 3-5     Crystals, Ur, HPF, POC neg     Casts, Ur, LPF, POC neg     Yeast, UA neg    POCT URINALYSIS DIPSTICK      Component Value Range   Color, UA amber     Clarity, UA clear     Glucose, UA neg     Bilirubin, UA small     Ketones, UA 15     Spec Grav, UA 1.025     Blood, UA trace-intact     pH, UA 5.5     Protein, UA 30     Urobilinogen, UA 1.0     Nitrite, UA neg     Leukocytes, UA small (1+)          Assessment & Plan:  1. Pyelonephritis - Rocephin 1 gm IM x 1 now and Zofran 4mg  sl x 1 now.  Sent cipro to pharmacy along w/ few norco and promethazine for symptomatic care. Stop nsaids since could irritate kidneys/stomach. Instructed to go home x 24hrs min.

## 2011-10-05 NOTE — Patient Instructions (Addendum)
Pyelonephritis, Adult Pyelonephritis is a kidney infection. In general, there are 2 main types of pyelonephritis:  Infections that come on quickly without any warning (acute pyelonephritis).   Infections that persist for a long period of time (chronic pyelonephritis).  CAUSES  Two main causes of pyelonephritis are:  Bacteria traveling from the bladder to the kidney. This is a problem especially in pregnant women. The urine in the bladder can become filled with bacteria from multiple causes, including:   Inflammation of the prostate gland (prostatitis).   Sexual intercourse in females.   Bladder infection (cystitis).   Bacteria traveling from the bloodstream to the tissue part of the kidney.  Problems that may increase your risk of getting a kidney infection include:  Diabetes.   Kidney stones or bladder stones.   Cancer.   Catheters placed in the bladder.   Other abnormalities of the kidney or ureter.  SYMPTOMS   Abdominal pain.   Pain in the side or flank area.   Fever.   Chills.   Upset stomach.   Blood in the urine (dark urine).   Frequent urination.   Strong or persistent urge to urinate.   Burning or stinging when urinating.  DIAGNOSIS  Your caregiver may diagnose your kidney infection based on your symptoms. A urine sample may also be taken. TREATMENT  In general, treatment depends on how severe the infection is.   If the infection is mild and caught early, your caregiver may treat you with oral antibiotics and send you home.   If the infection is more severe, the bacteria may have gotten into the bloodstream. This will require intravenous (IV) antibiotics and a hospital stay. Symptoms may include:   High fever.   Severe flank pain.   Shaking chills.   Even after a hospital stay, your caregiver may require you to be on oral antibiotics for a period of time.   Other treatments may be required depending upon the cause of the infection.  HOME CARE  INSTRUCTIONS   Take your antibiotics as directed. Finish them even if you start to feel better.   Make an appointment to have your urine checked to make sure the infection is gone.   Drink enough fluids to keep your urine clear or pale yellow.   Take medicines for the bladder if you have urgency and frequency of urination as directed by your caregiver.  SEEK IMMEDIATE MEDICAL CARE IF:   You have a fever.   You are unable to take your antibiotics or fluids.   You develop shaking chills.   You experience extreme weakness or fainting.   There is no improvement after 2 days of treatment.  MAKE SURE YOU:  Understand these instructions.   Will watch your condition.   Will get help right away if you are not doing well or get worse.  Document Released: 01/04/2005 Document Revised: 12/24/2010 Document Reviewed: 06/10/2010 [employer] Patient Information 2012 [employer]. 

## 2011-10-05 NOTE — Addendum Note (Signed)
Addended by: Eulah Pont on: 10/05/2011 08:47 AM   Modules accepted: Orders

## 2011-10-05 NOTE — Progress Notes (Signed)
Reviewed and agree.

## 2011-10-07 LAB — URINE CULTURE

## 2011-10-09 ENCOUNTER — Other Ambulatory Visit: Payer: Self-pay | Admitting: Physician Assistant

## 2011-12-21 ENCOUNTER — Emergency Department (HOSPITAL_COMMUNITY): Payer: 59

## 2011-12-21 ENCOUNTER — Observation Stay (HOSPITAL_COMMUNITY)
Admission: EM | Admit: 2011-12-21 | Discharge: 2011-12-22 | Disposition: A | Discharge status: Home / Self Care | Payer: 59 | Attending: Family Medicine | Admitting: Family Medicine

## 2011-12-21 ENCOUNTER — Encounter (HOSPITAL_COMMUNITY): Payer: Self-pay | Admitting: Emergency Medicine

## 2011-12-21 DIAGNOSIS — R05 Cough: Secondary | ICD-10-CM | POA: Insufficient documentation

## 2011-12-21 DIAGNOSIS — K219 Gastro-esophageal reflux disease without esophagitis: Secondary | ICD-10-CM | POA: Diagnosis present

## 2011-12-21 DIAGNOSIS — R4781 Slurred speech: Secondary | ICD-10-CM

## 2011-12-21 DIAGNOSIS — R079 Chest pain, unspecified: Secondary | ICD-10-CM | POA: Insufficient documentation

## 2011-12-21 DIAGNOSIS — G459 Transient cerebral ischemic attack, unspecified: Principal | ICD-10-CM | POA: Diagnosis present

## 2011-12-21 DIAGNOSIS — F411 Generalized anxiety disorder: Secondary | ICD-10-CM | POA: Insufficient documentation

## 2011-12-21 DIAGNOSIS — R0602 Shortness of breath: Secondary | ICD-10-CM | POA: Insufficient documentation

## 2011-12-21 DIAGNOSIS — R059 Cough, unspecified: Secondary | ICD-10-CM | POA: Insufficient documentation

## 2011-12-21 DIAGNOSIS — R51 Headache: Secondary | ICD-10-CM | POA: Insufficient documentation

## 2011-12-21 DIAGNOSIS — Z79899 Other long term (current) drug therapy: Secondary | ICD-10-CM | POA: Insufficient documentation

## 2011-12-21 HISTORY — DX: Supraventricular tachycardia, unspecified: I47.10

## 2011-12-21 HISTORY — DX: Supraventricular tachycardia: I47.1

## 2011-12-21 HISTORY — DX: Diaphragmatic hernia without obstruction or gangrene: K44.9

## 2011-12-21 HISTORY — DX: Gastro-esophageal reflux disease without esophagitis: K21.9

## 2011-12-21 HISTORY — DX: Unspecified osteoarthritis, unspecified site: M19.90

## 2011-12-21 HISTORY — DX: Essential (primary) hypertension: I10

## 2011-12-21 HISTORY — DX: Anxiety disorder, unspecified: F41.9

## 2011-12-21 HISTORY — DX: Unspecified asthma, uncomplicated: J45.909

## 2011-12-21 LAB — URINALYSIS, ROUTINE W REFLEX MICROSCOPIC
Bilirubin Urine: NEGATIVE
Ketones, ur: NEGATIVE mg/dL
Nitrite: NEGATIVE
Protein, ur: NEGATIVE mg/dL
pH: 5.5 (ref 5.0–8.0)

## 2011-12-21 LAB — CBC WITH DIFFERENTIAL/PLATELET
Basophils Absolute: 0 10*3/uL (ref 0.0–0.1)
Basophils Relative: 0 % (ref 0–1)
Eosinophils Absolute: 0.3 10*3/uL (ref 0.0–0.7)
Eosinophils Relative: 5 % (ref 0–5)
HCT: 45.5 % (ref 36.0–46.0)
Hemoglobin: 15.6 g/dL — ABNORMAL HIGH (ref 12.0–15.0)
MCH: 31.6 pg (ref 26.0–34.0)
MCHC: 34.3 g/dL (ref 30.0–36.0)
MCV: 92.1 fL (ref 78.0–100.0)
Monocytes Absolute: 0.6 10*3/uL (ref 0.1–1.0)
Monocytes Relative: 9 % (ref 3–12)
RDW: 12.6 % (ref 11.5–15.5)

## 2011-12-21 LAB — BASIC METABOLIC PANEL
BUN: 13 mg/dL (ref 6–23)
Calcium: 9.4 mg/dL (ref 8.4–10.5)
Chloride: 103 mEq/L (ref 96–112)
Creatinine, Ser: 0.64 mg/dL (ref 0.50–1.10)
GFR calc Af Amer: >90 mL/min (ref >90)

## 2011-12-21 LAB — TROPONIN I: Troponin I: <0.3 ng/mL (ref <0.30)

## 2011-12-21 LAB — URINE MICROSCOPIC-ADD ON

## 2011-12-21 MED ORDER — SODIUM CHLORIDE 0.9 % IV BOLUS (SEPSIS)
1000.0000 mL | Freq: Once | INTRAVENOUS | Status: AC
  Administered 2011-12-21: 1000 mL via INTRAVENOUS

## 2011-12-21 NOTE — ED Provider Notes (Signed)
I saw and evaluated the patient, reviewed the resident's note and I agree with the findings and plan. Agree with EKG interpretation if present.   Pt with multiple complaints over the last several days but concern for TIA symptoms earlier today since resolved. Will admit for further eval.   Charles B. Bernette Mayers, MD 12/21/11 2358

## 2011-12-21 NOTE — ED Provider Notes (Signed)
History     CSN: [id number]  Arrival date & time 12/21/11  8295   First MD Initiated Contact with Patient 12/21/11 2033     Chief Complaint  Patient presents with  . Altered Mental Status   HPI: Pamela Daniel is a 61 yo CF with history of cardiac ablation, anxiety, and GERD who presents with multiple complaints for several days. Symptoms started one week ago with decreased exercise tolerance, shortness of breath, and chest heaviness. Normally she is able to perform all daily activities without difficulty. Now she gets SOB walking in her home. These symptoms have been persistent. Today, her family members noted mild confusion, that she appeared "out of it," pale and slurring her speech. This was first noticed at 1 pm by her boyfriend. When he returned home at 5 pm she continued to have slurred speech although it was improving. Her family members convinced her to come to the ED. On arrival she only complains of chest heaviness and shortness of breath with exertion. No focal neurologic deficits or slurred speech to warrant code stroke activation.   History reviewed. No pertinent past medical history.  History reviewed. No pertinent past surgical history.  History reviewed. No pertinent family history.  History  Substance Use Topics  . Smoking status: Never Smoker   . Smokeless tobacco: Not on file  . Alcohol Use: Not on file    OB History    Grav Para Term Preterm Abortions TAB SAB Ect Mult Living                  Review of Systems  Constitutional: Positive for fatigue. Negative for fever, chills and appetite change.  HENT: Negative for congestion and rhinorrhea.   Eyes: Negative for photophobia and visual disturbance.  Respiratory: Positive for cough (chronic cough) and shortness of breath. Negative for stridor.   Cardiovascular: Positive for chest pain (chest heaviness). Negative for leg swelling.  Gastrointestinal: Positive for nausea. Negative for vomiting, abdominal pain and  diarrhea.  Genitourinary: Negative for frequency and decreased urine volume.  Musculoskeletal: Negative for myalgias, back pain and arthralgias.  Skin: Negative for pallor and rash.  Neurological: Positive for speech difficulty (slurred speech today) and headaches (occipital headache today). Negative for dizziness, weakness and numbness.  Psychiatric/Behavioral: Positive for confusion (earlier today). Negative for agitation.  All other systems reviewed and are negative.    Allergies  Review of patient's allergies indicates no known allergies.  Home Medications   Current Outpatient Rx  Name  Route  Sig  Dispense  Refill  . ALBUTEROL SULFATE HFA 108 (90 BASE) MCG/ACT IN AERS   Inhalation   Inhale 2 puffs into the lungs every 4 (four) hours as needed. Shortness of breath/wheezing         . ALPRAZOLAM 0.25 MG PO TABS   Oral   Take 0.125-0.25 mg by mouth daily as needed. For anxiety         . IBUPROFEN 200 MG PO TABS   Oral   Take 400 mg by mouth every 6 (six) hours as needed. For pain         . RANITIDINE HCL 150 MG PO TABS   Oral   Take 150 mg by mouth 2 (two) times daily as needed. For acid reflux           BP 141/76  Pulse 82  Temp 97.7 F (36.5 C) (Oral)  Resp 18  SpO2 96%  Physical Exam  Nursing note and vitals reviewed.  Constitutional: She is oriented to person, place, and time. She appears well-developed and well-nourished. She is cooperative. No distress.  HENT:  Head: Normocephalic and atraumatic.  Mouth/Throat: Oropharynx is clear and moist. Mucous membranes are dry.  Eyes: Conjunctivae normal and EOM are normal. Pupils are equal, round, and reactive to light.  Neck: Trachea normal and normal range of motion. Neck supple.  Cardiovascular: Normal rate, regular rhythm, S1 normal, S2 normal and normal heart sounds.  Exam reveals no decreased pulses.   Pulmonary/Chest: Effort normal and breath sounds normal. She has no decreased breath sounds.  Abdominal:  Soft. Normal appearance and bowel sounds are normal. There is no tenderness.  Musculoskeletal: Normal range of motion.  Neurological: She is alert and oriented to person, place, and time. She has normal strength and normal reflexes. No cranial nerve deficit or sensory deficit. Coordination and gait normal. GCS eye subscore is 4. GCS verbal subscore is 5. GCS motor subscore is 6.  Skin: Skin is warm and dry. No pallor.    ED Course  Procedures  Labs Reviewed  CBC WITH DIFFERENTIAL - Abnormal; Notable for the following:    Hemoglobin 15.6 (*)     All other components within normal limits  BASIC METABOLIC PANEL - Abnormal; Notable for the following:    Glucose, Bld 111 (*)     All other components within normal limits  URINALYSIS, ROUTINE W REFLEX MICROSCOPIC - Abnormal; Notable for the following:    Leukocytes, UA MODERATE (*)     All other components within normal limits  URINE MICROSCOPIC-ADD ON  TROPONIN I  PRO B NATRIURETIC PEPTIDE   Ct Head Wo Contrast  12/21/2011  *RADIOLOGY REPORT*  Clinical Data: Confusion, slurred speech and fatigue.  CT HEAD WITHOUT CONTRAST  Technique:  Contiguous axial images were obtained from the base of the skull through the vertex without contrast.  Comparison: Skull radiographs performed 04/22/2011  Findings: There is no evidence of acute infarction, mass lesion, or intra- or extra-axial hemorrhage on CT.  The posterior fossa, including the cerebellum, brainstem and fourth ventricle, is within normal limits.  The third and lateral ventricles, and basal ganglia are unremarkable in appearance.  The cerebral hemispheres are symmetric in appearance, with normal gray- white differentiation.  No mass effect or midline shift is seen.  There is no evidence of fracture; visualized osseous structures are unremarkable in appearance.  The visualized portions of the orbits are within normal limits.  There is mild partial opacification of the left maxillary sinus and right  side of the sphenoid sinus; the remaining paranasal sinuses and mastoid air cells are well-aerated. No significant soft tissue abnormalities are seen.  IMPRESSION:  1.  No acute intracranial pathology seen on CT. 2.  Mild partial opacification of the left maxillary sinus and right side of the sphenoid sinus.   Original Report Authenticated By: Tonia Ghent, M.D.    MDM   61 yo CF with history of cardiac ablation, anxiety, and GERD who presents with multiple complaints for several days. Afebrile, vital signs stable. Concern for TIA vs metabolic derangement vs UTI vs pericardial effusion. Doubt ACS as ECG without evidence of ongoing ischemia, initial troponin negative. Doubt PE as she is PERC negative, normal troponin and BNP makes clinically significant PE unlikely. Pericardial effusion less likely as BNP normal but still possible, unable to evaluate fully with bedside US due to body habitus. Electrolytes and cr all normal. Hgb 15.6 and WBC 6.9, anemia not cause of symptoms. UA with  pyuria but not symptomatic will hold treatment and await culture. Head CT without evidence of acute ischemia or other abnormality. Patient HD stable while in the ED. Unknown cause of symptoms but slurred speech and confusion concerning for TIA. NIH stroke scale 0 on arrival. Discussed with Family Practice who will admit to their service. Patient in agreement with this plan.   Reviewed imaging, labs and previous medical records, utilized in MDM  Discussed case with Dr. Bernette Mayers   Clinical Impression 1. Transient slurred speech 2. Transient confusion 3. Decreased exercise tolerance         Margie Billet, MD 12/21/11 2356

## 2011-12-21 NOTE — ED Notes (Signed)
Pt c/o some confusion and generalized fatigue and "not feeling right" x 3 days; pt sts some pain in lower abd into legs that has improved

## 2011-12-22 ENCOUNTER — Encounter (HOSPITAL_COMMUNITY): Payer: Self-pay | Admitting: Family Medicine

## 2011-12-22 DIAGNOSIS — Z8673 Personal history of transient ischemic attack (TIA), and cerebral infarction without residual deficits: Secondary | ICD-10-CM

## 2011-12-22 DIAGNOSIS — R4789 Other speech disturbances: Secondary | ICD-10-CM

## 2011-12-22 DIAGNOSIS — G459 Transient cerebral ischemic attack, unspecified: Secondary | ICD-10-CM | POA: Diagnosis present

## 2011-12-22 HISTORY — DX: Personal history of transient ischemic attack (TIA), and cerebral infarction without residual deficits: Z86.73

## 2011-12-22 LAB — CBC
MCH: 30.7 pg (ref 26.0–34.0)
MCHC: 33.2 g/dL (ref 30.0–36.0)
MCV: 92.6 fL (ref 78.0–100.0)
Platelets: 205 10*3/uL (ref 150–400)
RBC: 4.46 MIL/uL (ref 3.87–5.11)
RDW: 13 % (ref 11.5–15.5)

## 2011-12-22 LAB — HEMOGLOBIN A1C: Hgb A1c MFr Bld: 5.6 % (ref <5.7)

## 2011-12-22 LAB — RAPID URINE DRUG SCREEN, HOSP PERFORMED
Amphetamines: NOT DETECTED
Barbiturates: NOT DETECTED
Barbiturates: NOT DETECTED
Benzodiazepines: NOT DETECTED
Benzodiazepines: NOT DETECTED
Cocaine: NOT DETECTED
Cocaine: NOT DETECTED
Opiates: NOT DETECTED
Opiates: NOT DETECTED
Tetrahydrocannabinol: NOT DETECTED

## 2011-12-22 LAB — LIPID PANEL
HDL: 46 mg/dL (ref >39)
LDL Cholesterol: 81 mg/dL (ref 0–99)

## 2011-12-22 LAB — BASIC METABOLIC PANEL
CO2: 28 mEq/L (ref 19–32)
Calcium: 9 mg/dL (ref 8.4–10.5)
Creatinine, Ser: 0.68 mg/dL (ref 0.50–1.10)

## 2011-12-22 LAB — TROPONIN I: Troponin I: <0.3 ng/mL (ref <0.30)

## 2011-12-22 MED ORDER — SODIUM CHLORIDE 0.9 % IV SOLN: 250.0000 mL | INTRAVENOUS | Status: DC | PRN

## 2011-12-22 MED ORDER — IBUPROFEN 400 MG PO TABS
400.0000 mg | ORAL_TABLET | Freq: Four times a day (QID) | ORAL | Status: DC | PRN
  Filled 2011-12-22: qty 1

## 2011-12-22 MED ORDER — ACETAMINOPHEN 325 MG PO TABS: 650.0000 mg | ORAL_TABLET | Freq: Four times a day (QID) | ORAL | Status: DC | PRN

## 2011-12-22 MED ORDER — ASPIRIN 81 MG PO CHEW
81.0000 mg | CHEWABLE_TABLET | Freq: Every day | ORAL | Status: DC
Start: 2011-12-22 — End: 2011-12-23
  Administered 2011-12-22: 81 mg via ORAL

## 2011-12-22 MED ORDER — ASPIRIN 81 MG PO CHEW: 81.0000 mg | CHEWABLE_TABLET | Freq: Every day | ORAL | Status: DC

## 2011-12-22 MED ORDER — ACETAMINOPHEN 650 MG RE SUPP: 650.0000 mg | Freq: Four times a day (QID) | RECTAL | Status: DC | PRN

## 2011-12-22 MED ORDER — SODIUM CHLORIDE 0.9 % IJ SOLN: 3.0000 mL | INTRAMUSCULAR | Status: DC | PRN

## 2011-12-22 MED ORDER — SODIUM CHLORIDE 0.9 % IJ SOLN
3.0000 mL | Freq: Two times a day (BID) | INTRAMUSCULAR | Status: DC
  Administered 2011-12-22: 3 mL via INTRAVENOUS

## 2011-12-22 MED ORDER — SODIUM CHLORIDE 0.9 % IJ SOLN: 3.0000 mL | Freq: Two times a day (BID) | INTRAMUSCULAR | Status: DC

## 2011-12-22 MED ORDER — SULFAMETHOXAZOLE-TMP DS 800-160 MG PO TABS
1.0000 | ORAL_TABLET | Freq: Two times a day (BID) | ORAL | Status: DC
  Administered 2011-12-22 (×2): 1 via ORAL
  Filled 2011-12-22 (×2): qty 1

## 2011-12-22 MED ORDER — STROKE: EARLY STAGES OF RECOVERY BOOK
Freq: Once | Status: AC
  Administered 2011-12-22: 11:00:00
  Filled 2011-12-22: qty 1

## 2011-12-22 MED ORDER — ALBUTEROL SULFATE HFA 108 (90 BASE) MCG/ACT IN AERS: 2.0000 | INHALATION_SPRAY | RESPIRATORY_TRACT | Status: DC | PRN

## 2011-12-22 MED ORDER — ATORVASTATIN CALCIUM 20 MG PO TABS: 20.0000 mg | ORAL_TABLET | Freq: Every day | ORAL | Status: DC

## 2011-12-22 MED ORDER — ENOXAPARIN SODIUM 40 MG/0.4ML ~~LOC~~ SOLN
40.0000 mg | Freq: Every day | SUBCUTANEOUS | Status: DC
  Administered 2011-12-22: 40 mg via SUBCUTANEOUS
  Filled 2011-12-22: qty 0.4

## 2011-12-22 MED ORDER — SULFAMETHOXAZOLE-TMP DS 800-160 MG PO TABS: 1.0000 | ORAL_TABLET | Freq: Two times a day (BID) | ORAL | Status: DC

## 2011-12-22 MED ORDER — FAMOTIDINE 10 MG PO TABS
10.0000 mg | ORAL_TABLET | Freq: Every day | ORAL | Status: DC
  Administered 2011-12-22: 10 mg via ORAL
  Filled 2011-12-22: qty 1

## 2011-12-22 MED ORDER — ALPRAZOLAM 0.25 MG PO TABS: 0.1250 mg | ORAL_TABLET | Freq: Every day | ORAL | Status: DC | PRN

## 2011-12-22 NOTE — Progress Notes (Signed)
Pt given discharge instructions with understanding. Pt had no questions at this time. Pt iv taken out and tele d/c. Pt husband at bedside. Pt wheeled out in wheelchair with assistance.

## 2011-12-22 NOTE — Discharge Summary (Signed)
Physician Discharge Summary  Patient ID: Pamela Daniel MRN: [id number] DOB: [birth year] Age: 61 y.o.  Admit date: 12/21/2011 Discharge date: 12/22/2011 Admitting Physician: Sanjuana Letters, MD  PCP: Sheila Oats, MD  Consultants:None      Discharge Diagnosis:  Principal Problem:  *TIA (transient ischemic attack) Active Problems:  Pamela Daniel    Hospital Course Pamela Daniel is a 61 y.o. year old female who presented with transient slurred speech and confusion concerning for a TIA.   1)TIA - Pt presented to the ED with a few hours of slurred speech and not feel right.  In the ED, multiple tests were ordered including a CT head which was neg CT and her Sx resolved while in the ED.  Her ABCD2 score = 5 (moderated risk stroke) and her BMP, CBC were both WNL and her troponin's were negative x 3, an EKG showed a new first degree block but no other acute changes.  Her UA showed moderate LE in which she was placed on bactrim for this.  Pt was transferred to the floor for a TIA workup.  Her Lipid Profile showed TC 154, LDL 81.  Her A1C was 5.6.  A 2 Daniel Echo showed no abnormalities and her EF was normal.  A B/L Carotid Doppler was ordered but will need to be performed as an outpatient.  She was sent home on ASA 81 mg and Lipitor 20 mg.  Pt was stable at the time of Daniel/c.   2) Anxiety - Stable, continued home Xanax PRN  3) GERD - Stable, continued home meds.         Discharge PE   Filed Vitals:   12/22/11 1138  BP: 137/78  Pulse: 70  Temp: 97.9 F (36.6 C)  Resp: 18   General: NAD HEENT: PERRLA, extra ocular movement intact Heart: S1, S2 normal, no murmur, rub or gallop, regular rate and rhythm  Lungs: clear to auscultation Extremities: extremities normal, atraumatic, no cyanosis or edema  Skin:no rashes  Neurology: normal without focal findings    Procedures/Imaging:  Ct Head Wo Contrast  12/21/2011   IMPRESSION:  1.  No acute intracranial pathology seen on CT. 2.   Mild partial opacification of the left maxillary sinus and right side of the sphenoid sinus.   Original Report Authenticated By: Tonia Ghent, M.Daniel.    Echo 12/22/11 -  Study Conclusions  Left ventricle: The cavity size was normal. Systolic function was normal. Wall motion was normal; there were no regional wall motion abnormalities.   Labs  CBC  Lab 12/22/11 0510 12/21/11 1845  WBC 5.9 6.9  HGB 13.7 15.6*  HCT 41.3 45.5  PLT 205 235   BMET  Lab 12/22/11 0510 12/21/11 1845  NA 140 140  K 4.4 3.8  CL 108 103  CO2 28 26  BUN 10 13  CREATININE 0.68 0.64  CALCIUM 9.0 9.4  PROT -- --  BILITOT -- --  ALKPHOS -- --  ALT -- --  AST -- --  GLUCOSE 111* 111*       Component Value Range Comment   Troponin I <0.30  <0.30 ng/mL   LIPID PANEL     Status: Normal   Collection Time   12/22/11  5:10 AM      Component Value Range Comment   Cholesterol 154  0 - 200 mg/dL    Triglycerides 782  <956 mg/dL    HDL 46  >21 mg/dL    Total CHOL/HDL Ratio  3.3      VLDL 27  0 - 40 mg/dL    LDL Cholesterol 81  0 - 99 mg/dL   TROPONIN I     Status: Normal   Collection Time   12/22/11 10:02 AM      Component Value Range Comment   Troponin I <0.30  <0.30 ng/mL   HEMOGLOBIN A1C     Status: Normal   Collection Time   12/22/11 10:02 AM      Component Value Range Comment   Hemoglobin A1C 5.6  <5.7 %    Mean Plasma Glucose 114  <117 mg/dL   URINE RAPID DRUG SCREEN (HOSP PERFORMED)     Status: Normal   Collection Time   12/22/11  2:10 PM      Component Value Range Comment   Opiates NONE DETECTED  NONE DETECTED    Cocaine NONE DETECTED  NONE DETECTED    Benzodiazepines NONE DETECTED  NONE DETECTED    Amphetamines NONE DETECTED  NONE DETECTED    Tetrahydrocannabinol NONE DETECTED  NONE DETECTED    Barbiturates NONE DETECTED  NONE DETECTED        Patient condition at time of discharge/disposition: stable  Disposition-home   Follow up issues: 1. Further TIA w/u including carotid doppler  as an outpatient.  Discharge follow up:  Follow-up Information    Follow up with Please make an appointment with your primary doctor within the next two days.Marland Kitchen        Discharge Orders    Future Orders Please Complete By Expires   Diet - low sodium heart healthy      Increase activity slowly          Discharge Instructions: Please refer to Patient Instructions section of EMR for full details.  Patient was counseled important signs and symptoms that should prompt return to medical care, changes in medications, dietary instructions, activity restrictions, and follow up appointments.  Significant instructions noted below:    Discharge Medications   Medication List     As of 12/22/2011  9:55 PM    START taking these medications         aspirin 81 MG chewable tablet   Chew 1 tablet (81 mg total) by mouth daily.      atorvastatin 20 MG tablet   Commonly known as: LIPITOR   Take 1 tablet (20 mg total) by mouth daily.      sulfamethoxazole-trimethoprim 800-160 MG per tablet   Commonly known as: BACTRIM DS   Take 1 tablet by mouth every 12 (twelve) hours.      CONTINUE taking these medications         albuterol 108 (90 BASE) MCG/ACT inhaler   Commonly known as: PROVENTIL HFA;VENTOLIN HFA      ALPRAZolam 0.25 MG tablet   Commonly known as: XANAX      ibuprofen 200 MG tablet   Commonly known as: ADVIL,MOTRIN      ranitidine 150 MG tablet   Commonly known as: ZANTAC          Where to get your medications    These are the prescriptions that you need to pick up. We sent them to a specific pharmacy, so you will need to go there to get them.   [facility] [address], [place] - [address] AT [facility] OF [facility] [address]    [address] [place] [address]    Phone: [phone]  Hours: 24-hours        aspirin 81 MG chewable tablet   atorvastatin 20 MG tablet   sulfamethoxazole-trimethoprim 800-160 MG per tablet                Gildardo Cranker, DO of [facility] 12/22/2011 9:45 PM

## 2011-12-22 NOTE — H&P (Signed)
Family Medicine Teaching [facility]: Lambert Rhonda W Admission History and Physical  Patient name: Pamela Daniel Medical record number: [id number] Date of birth: [birth year] Age: 61 y.o. Gender: female  Primary Care Provider: Sheila Oats, MD  Chief Complaint: Confusion, slurred speech  History of Present Illness: Pamela Daniel is a 61 y.o. year old female presenting with confusion and slurred speech. These started today at approximately 1:00 PM. These symptoms were noticed by her boyfriend and family members, and the patient notes that she could "get myself together." These symptoms last until 8:00 PM, when they resolved spontaneously. She denies any history of similar symptoms. This episode was accompanied by mild shortness of breath and chest pressure, but she denies chest pain. She also denies palpitations. She notes nausea, fatigue, and blurred vision were present as well. Family members deny any focal deficits aside from slurred speech. Currently, the patient denies any symptoms.   Patient Active Problem List  Diagnosis  . HYPERTENSION  . ESOPHAGEAL STRICTURE  . G E R D  . HIATAL HERNIA  . RECTAL BLEEDING  . COUGH  . DIARRHEA  . COLONIC POLYPS, HX OF  . SVT/ PSVT/ PAT   Past Medical History: Past Medical History  Diagnosis Date  . PSVT (paroxysmal supraventricular tachycardia)   . Hiatal hernia     Past Surgical History: Past Surgical History  Procedure Date  . Cardiac electrophysiology mapping and ablation 2011  . Nissan fundoplication 2007  . Abdominal hysterectomy 1996    Social History: History   Social History  . Marital Status: Divorced    Spouse Name: N/A    Number of Children: N/A  . Years of Education: N/A   Social History Main Topics  . Smoking status: Never Smoker   . Smokeless tobacco: None  . Alcohol Use: Yes  . Drug Use: No  . Sexually Active: None   Other Topics Concern  . None   Social History Narrative   Works at [employer]     Family History: History reviewed. No pertinent family history.  Allergies: No Known Allergies  Current Facility-Administered Medications  Medication Dose Route Frequency Provider Last Rate Last Dose  . [COMPLETED] sodium chloride 0.9 % bolus 1,000 mL  1,000 mL Intravenous Once Margie Billet, MD   1,000 mL at 12/21/11 2206   Current Outpatient Prescriptions  Medication Sig Dispense Refill  . albuterol (PROVENTIL HFA;VENTOLIN HFA) 108 (90 BASE) MCG/ACT inhaler Inhale 2 puffs into the lungs every 4 (four) hours as needed. Shortness of breath/wheezing      . ALPRAZolam (XANAX) 0.25 MG tablet Take 0.125-0.25 mg by mouth daily as needed. For anxiety      . ibuprofen (ADVIL,MOTRIN) 200 MG tablet Take 400 mg by mouth every 6 (six) hours as needed. For pain      . ranitidine (ZANTAC) 150 MG tablet Take 150 mg by mouth 2 (two) times daily as needed. For acid reflux       Review Of Systems: Per HPI with the following additions: none Otherwise 12 point review of systems was performed and was unremarkable.  Physical Exam: BP 144/80  Pulse 69  Temp 97.6 F (36.4 C) (Oral)  Resp 19  SpO2 97% General: alert, cooperative, appears stated age and no distress HEENT: PERRLA, extra ocular movement intact, sclera clear, anicteric and oropharynx clear, no lesions Heart: S1, S2 normal, no murmur, rub or gallop, regular rate and rhythm Lungs: clear to auscultation, no wheezes or rales and unlabored breathing Abdomen: abdomen is soft  without significant tenderness, masses, organomegaly or guarding Extremities: extremities normal, atraumatic, no cyanosis or edema Skin:no rashes Neurology: normal without focal findings, mental status, speech normal, alert and oriented x3, PERLA, cranial nerves 2-12 intact and reflexes normal and symmetric  Labs and Imaging:  Results for orders placed during the hospital encounter of 12/21/11 (from the past 24 hour(s))  CBC WITH DIFFERENTIAL     Status: Abnormal    Collection Time   12/21/11  6:45 PM      Component Value Range   WBC 6.9  4.0 - 10.5 K/uL   RBC 4.94  3.87 - 5.11 MIL/uL   Hemoglobin 15.6 (*) 12.0 - 15.0 g/dL   HCT 29.5  28.4 - 13.2 %   MCV 92.1  78.0 - 100.0 fL   MCH 31.6  26.0 - 34.0 pg   MCHC 34.3  30.0 - 36.0 g/dL   RDW 44.0  10.2 - 72.5 %   Platelets 235  150 - 400 K/uL   Neutrophils Relative 46  43 - 77 %   Neutro Abs 3.2  1.7 - 7.7 K/uL   Lymphocytes Relative 40  12 - 46 %   Lymphs Abs 2.8  0.7 - 4.0 K/uL   Monocytes Relative 9  3 - 12 %   Monocytes Absolute 0.6  0.1 - 1.0 K/uL   Eosinophils Relative 5  0 - 5 %   Eosinophils Absolute 0.3  0.0 - 0.7 K/uL   Basophils Relative 0  0 - 1 %   Basophils Absolute 0.0  0.0 - 0.1 K/uL  BASIC METABOLIC PANEL     Status: Abnormal   Collection Time   12/21/11  6:45 PM      Component Value Range   Sodium 140  135 - 145 mEq/L   Potassium 3.8  3.5 - 5.1 mEq/L   Chloride 103  96 - 112 mEq/L   CO2 26  19 - 32 mEq/L   Glucose, Bld 111 (*) 70 - 99 mg/dL   BUN 13  6 - 23 mg/dL   Creatinine, Ser 3.66  0.50 - 1.10 mg/dL   Calcium 9.4  8.4 - 44.0 mg/dL   GFR calc non Af Amer >90  >90 mL/min   GFR calc Af Amer >90  >90 mL/min  URINALYSIS, ROUTINE W REFLEX MICROSCOPIC     Status: Abnormal   Collection Time   12/21/11  6:48 PM      Component Value Range   Color, Urine YELLOW  YELLOW   APPearance CLEAR  CLEAR   Specific Gravity, Urine 1.025  1.005 - 1.030   pH 5.5  5.0 - 8.0   Glucose, UA NEGATIVE  NEGATIVE mg/dL   Hgb urine dipstick NEGATIVE  NEGATIVE   Bilirubin Urine NEGATIVE  NEGATIVE   Ketones, ur NEGATIVE  NEGATIVE mg/dL   Protein, ur NEGATIVE  NEGATIVE mg/dL   Urobilinogen, UA 0.2  0.0 - 1.0 mg/dL   Nitrite NEGATIVE  NEGATIVE   Leukocytes, UA MODERATE (*) NEGATIVE  URINE MICROSCOPIC-ADD ON     Status: Normal   Collection Time   12/21/11  6:48 PM      Component Value Range   Squamous Epithelial / LPF RARE  RARE   WBC, UA 11-20  <3 WBC/hpf   Bacteria, UA RARE  RARE  TROPONIN  I     Status: Normal   Collection Time   12/21/11  9:30 PM      Component Value Range   Troponin I <  0.30  <0.30 ng/mL  PRO B NATRIURETIC PEPTIDE     Status: Normal   Collection Time   12/21/11  9:30 PM      Component Value Range   Pro B Natriuretic peptide (BNP) 70.3  0 - 125 pg/mL    Ct Head Wo Contrast  12/21/2011  *RADIOLOGY REPORT*  Clinical Data: Confusion, slurred speech and fatigue.  CT HEAD WITHOUT CONTRAST  Technique:  Contiguous axial images were obtained from the base of the skull through the vertex without contrast.  Comparison: Skull radiographs performed 04/22/2011  Findings: There is no evidence of acute infarction, mass lesion, or intra- or extra-axial hemorrhage on CT.  The posterior fossa, including the cerebellum, brainstem and fourth ventricle, is within normal limits.  The third and lateral ventricles, and basal ganglia are unremarkable in appearance.  The cerebral hemispheres are symmetric in appearance, with normal gray- white differentiation.  No mass effect or midline shift is seen.  There is no evidence of fracture; visualized osseous structures are unremarkable in appearance.  The visualized portions of the orbits are within normal limits.  There is mild partial opacification of the left maxillary sinus and right side of the sphenoid sinus; the remaining paranasal sinuses and mastoid air cells are well-aerated. No significant soft tissue abnormalities are seen.  IMPRESSION:  1.  No acute intracranial pathology seen on CT. 2.  Mild partial opacification of the left maxillary sinus and right side of the sphenoid sinus.   Original Report Authenticated By: Tonia Ghent, M.D.     Date: 12/22/2011  Rate: 68  Rhythm: normal sinus rhythm  QRS Axis: normal  Intervals: PR prolonged  ST/T Wave abnormalities: nonspecific T wave changes  Conduction Disutrbances:first-degree A-V block   Narrative Interpretation: no STEMI  Old EKG Reviewed: new 1st degree AV  block     Assessment and Plan: Pamela Daniel is a 61 y.o. year old female presenting with transient slurred speech and confusion concerning for a TIA.   #TIA - neg CT and resolved Sx; ABCD2 score = 5 (moderated risk stroke) - check lipids, carotid doppler, 2D echo - consider MRI  # Chest Pressure/SOB - unknown etiology, likely anxiety since no EKG changes, elevated trop, normal Pro-BNP - place on tele w/ hx of SVT s/p ablation - F/u trop and Echo  #Anxiety - continue home Xanax PRN  # GI - Cont home H2 blocker - NPO until AM labs  # PPX - DVT - Lovenox  #Dispo - Admit to Family Med Teaching Service under Dr. Naomie Dean V. Clinton Sawyer, MD, MBA 12/22/2011, 1:20 AM Family Medicine Resident, PGY-2 [phone] pager

## 2011-12-22 NOTE — ED Notes (Addendum)
Dr Clinton Sawyer with Family Practice at bedside.

## 2011-12-22 NOTE — ED Notes (Signed)
Per boyfriend, pt with slurred speech around 1 pm and "not making sense".

## 2011-12-22 NOTE — Progress Notes (Signed)
*  PRELIMINARY RESULTS* Vascular Ultrasound Carotid Duplex (Doppler) has been completed.  Preliminary findings: Bilateral:  No evidence of hemodynamically significant internal carotid artery stenosis.   Vertebral artery flow is antegrade.     Farrel Demark, RDMS, RVT 12/22/2011, 4:00 PM

## 2011-12-22 NOTE — ED Notes (Signed)
Room ordered by MD is Behavioral Health.  Flow Manager notified and is changing order.

## 2011-12-22 NOTE — ED Notes (Signed)
Await call from [employer].

## 2011-12-22 NOTE — Plan of Care (Signed)
Problem: Discharge Progression Outcomes Goal: Discharge plan in place and appropriate Outcome: Completed/Met Date Met:  12/22/11 Home with husband

## 2011-12-22 NOTE — Progress Notes (Signed)
   CARE MANAGEMENT NOTE 12/22/2011  Patient:  Pamela Daniel, Pamela Daniel   Account Number:  [id number]  Date Initiated:  12/22/2011  Documentation initiated by:  GRAVES-BIGELOW,Muhammed Teutsch  Subjective/Objective Assessment:   Pt admitted with confusion and slurred speech. Has family support. Symptoms resolved spontaneously.     Action/Plan:   CM will continue to monitor for disposition needs.   Anticipated DC Date:  12/23/2011   Anticipated DC Plan:  HOME/SELF CARE      DC Planning Services  CM consult      Choice offered to / List presented to:             Status of service:  In process, will continue to follow Medicare Important Message given?   (If response is "NO", the following Medicare IM given date fields will be blank) Date Medicare IM given:   Date Additional Medicare IM given:    Discharge Disposition:    Per UR Regulation:  Reviewed for med. necessity/level of care/duration of stay  If discussed at Long Length of Stay Meetings, dates discussed:    Comments:

## 2011-12-22 NOTE — H&P (Signed)
Seen and examined.  Chart reviewed Discussed with Drs Paulina Fusi and Clinton Sawyer.  Agree with their management and documentation.  Briefly. 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  Daughters became concerned and brought her to ER.  All symptoms resolved around that time.  Feels back to normal today - perhaps a little excessive fatigue.  Denies feeling ill, fever, headache.  She was in her normal state of health until noon yesterday.  No new meds.  Denies drug usage.  No hx of seizure or brain trauma. Issues:  Unclear to me whether to categorize as TIA or as altered mental status.  Will proceed with TIA work up.  Given that she is back to normal and is a reliable patient, likely DC this afternoon, even if the work up is not complete.  My suspicion is that we will never know the etiology of this spell.

## 2011-12-22 NOTE — Progress Notes (Signed)
UR Completed Kalina Morabito Graves-Bigelow, RN,BSN [phone]  

## 2011-12-23 NOTE — Discharge Summary (Signed)
FMTS Attending Patient case discussed with resident team at time of consideration for discharge; she was seen earlier in the day by Dr Leveda Anna, along with the resident team.  After completion of her TIA workup, I agree with plan for discharge from the hospital with close outpatient follow up by her doctors at [facility]. Paula Compton, MD

## 2011-12-24 ENCOUNTER — Inpatient Hospital Stay: Payer: 59 | Admitting: Family Medicine

## 2011-12-25 ENCOUNTER — Ambulatory Visit (HOSPITAL_COMMUNITY)
Admission: EM | Admit: 2011-12-25 | Discharge: 2011-12-25 | Disposition: A | Discharge status: Home / Self Care | Payer: 59 | Source: Other Acute Inpatient Hospital

## 2011-12-25 ENCOUNTER — Ambulatory Visit: Payer: 59

## 2011-12-25 ENCOUNTER — Ambulatory Visit (INDEPENDENT_AMBULATORY_CARE_PROVIDER_SITE_OTHER): Payer: 59 | Admitting: Family Medicine

## 2011-12-25 ENCOUNTER — Ambulatory Visit (HOSPITAL_COMMUNITY)
Admission: RE | Admit: 2011-12-25 | Discharge: 2011-12-25 | Disposition: A | Discharge status: Home / Self Care | Payer: 59 | Source: Ambulatory Visit | Attending: Physician Assistant | Admitting: Physician Assistant

## 2011-12-25 VITALS — BP 159/82 | HR 80 | Temp 97.6°F | Resp 18 | Ht 60.75 in | Wt 171.0 lb

## 2011-12-25 DIAGNOSIS — R202 Paresthesia of skin: Secondary | ICD-10-CM

## 2011-12-25 DIAGNOSIS — R5381 Other malaise: Secondary | ICD-10-CM

## 2011-12-25 DIAGNOSIS — R209 Unspecified disturbances of skin sensation: Secondary | ICD-10-CM

## 2011-12-25 DIAGNOSIS — R0602 Shortness of breath: Secondary | ICD-10-CM

## 2011-12-25 DIAGNOSIS — R5383 Other fatigue: Secondary | ICD-10-CM

## 2011-12-25 DIAGNOSIS — F419 Anxiety disorder, unspecified: Secondary | ICD-10-CM

## 2011-12-25 LAB — POCT CBC
Granulocyte percent: 56.5 %G (ref 37–80)
HCT, POC: 50.9 % — AB (ref 37.7–47.9)
Hemoglobin: 15.6 g/dL (ref 12.2–16.2)
Lymph, poc: 2 (ref 0.6–3.4)
MCHC: 30.6 g/dL — AB (ref 31.8–35.4)
MPV: 10.8 fL (ref 0–99.8)
POC Granulocyte: 3.2 (ref 2–6.9)
POC MID %: 8.9 %M (ref 0–12)
RDW, POC: 13.4 %

## 2011-12-25 LAB — COMPREHENSIVE METABOLIC PANEL
AST: 20 U/L (ref 0–37)
CO2: 24 mEq/L (ref 19–32)
Calcium: 9.5 mg/dL (ref 8.4–10.5)
Glucose, Bld: 90 mg/dL (ref 70–99)
Potassium: 4.4 mEq/L (ref 3.5–5.3)
Sodium: 139 mEq/L (ref 135–145)
Total Bilirubin: 0.6 mg/dL (ref 0.3–1.2)

## 2011-12-25 MED ORDER — ALPRAZOLAM 0.25 MG PO TABS: 0.1250 mg | ORAL_TABLET | Freq: Every day | ORAL | Status: DC | PRN

## 2011-12-25 MED ORDER — IOHEXOL 350 MG/ML SOLN
100.0000 mL | Freq: Once | INTRAVENOUS | Status: AC | PRN
  Administered 2011-12-25: 100 mL via INTRAVENOUS

## 2011-12-25 MED ORDER — ALBUTEROL SULFATE HFA 108 (90 BASE) MCG/ACT IN AERS: 2.0000 | INHALATION_SPRAY | RESPIRATORY_TRACT | Status: DC | PRN

## 2011-12-25 NOTE — Progress Notes (Signed)
Patient ID: Pamela Daniel MRN: [id number], DOB: Apr 30, 1950, 61 y.o. Date of Encounter: 12/25/2011, 1:15 PM  Primary Physician: Tally Due, MD  Chief Complaint: SOB, fatigue, and hospital follow up  HPI: 61 y.o. year old female with history below presents for hospital follow up of likely TIA, SOB, and fatigue. Patient was admitted to [facility] on 12/21/11 for further evaluation of possible TIA. She states that for about a day to a day and half before her presentation to the [facility] ED she had slurred speech, paresthesias along her left arm, chest heaviness, and felt fatigued. It took her boyfriend and her two daughters a day to convince her to go to the ED for evaluation.   While in the hospital she had a CT head that did not show any acute intracranial pathology other than mild partial opacification of the left maxillary sinus and right side of the sphenoid sinus. She had an echo that indicated normal systolic function, normal wall motion, an no cardiac source of emboli identified. She also had carotid dopplers done in the hospital that indicated no significant extracranial carotid artery stenosis and vertebral were patent with antegrade flow.   Throughout her hospitalization she had unremarkable chemistries outside of mildly elevated glucose, negative troponin x 2, normal CBC outside of elevated hgb, and negative uds.   Currently she has not had any further slurred speech since her time in the ED. She does still complain of shortness of breath and fatigue. She states that she even gets winded with walking around her house, which at baseline this is not the case. She has not been wheezing through out any of this. She has not had any frank chest pain through out any of the above, but did have some chest heaviness at the onset that has since resolved. No cough.   Interestingly, she does tell me that on 12/20/11 her entire right leg was hurting her while she was with her daughter. When she got home  that evening she felt a bump close to the inguinal fold that was "a little smaller that a green pea." Her boyfriend also felt this area, and confirmed this, and did not feel this on the contralateral leg. The area in question was not palpable the following day. She has not had any leg swelling or erythema. She has not had any prolonged sedentary periods.   She has not had any issues with her PSVT since her cardiac ablation about a year and a half ago.    Past Medical History  Diagnosis Date  . PSVT (paroxysmal supraventricular tachycardia)   . Hiatal hernia   . Hypertension   . Anxiety   . Asthma   . GERD (gastroesophageal reflux disease)   . Arthritis     SPINE     Home Meds: Prior to Admission medications   Medication Sig Start Date End Date Taking? Authorizing Provider  albuterol (PROVENTIL HFA;VENTOLIN HFA) 108 (90 BASE) MCG/ACT inhaler Inhale 2 puffs into the lungs every 4 (four) hours as needed. Shortness of breath/wheezing   Yes Historical Provider, MD  ALPRAZolam (XANAX) 0.25 MG tablet Take 0.125-0.25 mg by mouth daily as needed. For anxiety   Yes Historical Provider, MD  aspirin 81 MG chewable tablet Chew 1 tablet (81 mg total) by mouth daily. 12/22/11  Yes Bryan R Hess, DO  atorvastatin (LIPITOR) 20 MG tablet Take 1 tablet (20 mg total) by mouth daily. 12/22/11  Yes Bryan R Hess, DO  ibuprofen (ADVIL,MOTRIN) 200 MG tablet  Take 400 mg by mouth every 6 (six) hours as needed. For pain   Yes Historical Provider, MD  ranitidine (ZANTAC) 150 MG tablet Take 150 mg by mouth 2 (two) times daily as needed. For acid reflux   Yes Historical Provider, MD  sulfamethoxazole-trimethoprim (BACTRIM DS) 800-160 MG per tablet Take 1 tablet by mouth every 12 (twelve) hours. 12/22/11  Yes Briscoe Deutscher, DO    Allergies: No Known Allergies  History   Social History  . Marital Status: Divorced    Spouse Name: N/A    Number of Children: N/A  . Years of Education: N/A   Occupational History  .  Not on file.   Social History Main Topics  . Smoking status: Never Smoker   . Smokeless tobacco: Never Used  . Alcohol Use: Yes     Comment: RARE  . Drug Use: No  . Sexually Active: Not on file   Other Topics Concern  . Not on file   Social History Narrative   Works at [employer]     Review of Systems: Constitutional: negative for chills, fever, night sweats, or weight changes  HEENT: Positive for vision changes. negative for hearing loss Cardiovascular: negative for chest pain or palpitations Respiratory: negative for hemoptysis, dyspnea, wheezing, or cough Abdominal: negative for abdominal pain, nausea, vomiting, diarrhea, or constipation Dermatological: negative for rash Neurologic: Positive for paresthesia and presyncope. negative for headache, dizziness, or syncope   Physical Exam: Blood pressure 159/82, pulse 80, temperature 97.6 F (36.4 C), temperature source Oral, resp. rate 18, height 5' 0.75" (1.543 m), weight 171 lb (77.565 kg), SpO2 95.00%., Body mass index is 32.58 kg/(m^2). General: Well developed, well nourished, in no acute distress. Head: Normocephalic, atraumatic, eyes without discharge, sclera non-icteric, nares are without discharge. Bilateral auditory canals clear, TM's are without perforation, pearly grey and translucent with reflective cone of light bilaterally. Oral cavity moist, posterior pharynx without exudate, erythema, peritonsillar abscess, or post nasal drip.  Neck: Supple. No thyromegaly. Full ROM. No lymphadenopathy. No bruits.  Lungs: Clear bilaterally to auscultation without wheezes, rales, or rhonchi. Breathing is unlabored. Heart: RRR with S1 S2. No murmurs, rubs, or gallops appreciated. Msk:  Strength and tone normal for age. Extremities/Skin: Warm and dry. No clubbing or cyanosis. No edema. No rashes or suspicious lesions. Calves supple, without erythema. Left calf measures 37.5 cm, right calf measure 38 cm.  Neuro: Alert and  oriented X 3. Moves all extremities spontaneously. Gait is normal. CNII-XII grossly in tact. Cerebellar function intact. Normal Rhomberg. DTR 2+ throughout.  Psych:  Responds to questions appropriately with a normal affect.   Labs: Results for orders placed in visit on 12/25/11  POCT CBC      Component Value Range   WBC 5.7  4.6 - 10.2 K/uL   Lymph, poc 2.0  0.6 - 3.4   POC LYMPH PERCENT 34.6  10 - 50 %L   MID (cbc) 0.5  0 - 0.9   POC MID % 8.9  0 - 12 %M   POC Granulocyte 3.2  2 - 6.9   Granulocyte percent 56.5  37 - 80 %G   RBC 5.26  4.04 - 5.48 M/uL   Hemoglobin 15.6  12.2 - 16.2 g/dL   HCT, POC 11.9 (*) 14.7 - 47.9 %   MCV 96.8  80 - 97 fL   MCH, POC 29.7  27 - 31.2 pg   MCHC 30.6 (*) 31.8 - 35.4 g/dL   RDW, POC  13.4     Platelet Count, POC 273  142 - 424 K/uL   MPV 10.8  0 - 99.8 fL   CMP pending.  [facility] reading (PRIMARY) by  Dr. Katrinka Blazing. Negative  ASSESSMENT AND PLAN:  61 y.o. year old female with likely TIA, shortness of breath, fatigue, and anxiety.  1. Likely TIA -Continue ASA 81 mg daily at this time -Continue Lipitor 20 mg daily -Discussed with patient she is at increased risk of stroke -Consider MRI  -Extensive time spent reviewing records and face to face with patient today  2. Shortness of breath/fatigue -CTA chest today with call report. CTA was negative for PE. Patient to see Dr. Nadara Eaton 12/9 or 12/10 if possible. Will have TL contact his office first thing 12/9 for possible appointment.  -If negative will need cardiology evaluation and possible stress tests vs cath -She has been instructed to perform incentive breathing at home -Plan for LE dopplers first of the week -Refilled Proventil 2 puffs inhaled q 4-6 hours prn #1 RF 2  3. Anxiety -Longstanding issue -Patient not currently exhibiting signs of this, she just requests refill as she is due -Refilled Xanax 0.25 mg 1/2 to 1 tab po daily prn #30 no RF -May refill for 6 months  4. She has been given ER  precautions for all of the above  5. Discussed with Dr. Katrinka Blazing   Signed, Eula Listen, PA-C 12/25/2011 1:15 PM

## 2011-12-27 NOTE — Progress Notes (Signed)
HPI, physical exam reviewed in detail with Eula Listen, PA-C.  CXR reviewed and negative.  Agree with outlined A/P. KMS

## 2011-12-28 ENCOUNTER — Ambulatory Visit (INDEPENDENT_AMBULATORY_CARE_PROVIDER_SITE_OTHER): Payer: 59 | Admitting: Family Medicine

## 2011-12-28 ENCOUNTER — Ambulatory Visit (HOSPITAL_COMMUNITY)
Admission: RE | Admit: 2011-12-28 | Discharge: 2011-12-28 | Disposition: A | Discharge status: Home / Self Care | Payer: 59 | Source: Ambulatory Visit | Attending: Physician Assistant | Admitting: Physician Assistant

## 2011-12-28 ENCOUNTER — Ambulatory Visit (HOSPITAL_COMMUNITY): Payer: 59

## 2011-12-28 ENCOUNTER — Other Ambulatory Visit: Payer: Self-pay | Admitting: Physician Assistant

## 2011-12-28 VITALS — BP 146/86 | HR 74 | Temp 98.0°F | Resp 18

## 2011-12-28 DIAGNOSIS — R0600 Dyspnea, unspecified: Secondary | ICD-10-CM

## 2011-12-28 DIAGNOSIS — R5381 Other malaise: Secondary | ICD-10-CM | POA: Insufficient documentation

## 2011-12-28 DIAGNOSIS — R5383 Other fatigue: Secondary | ICD-10-CM

## 2011-12-28 DIAGNOSIS — R410 Disorientation, unspecified: Secondary | ICD-10-CM

## 2011-12-28 DIAGNOSIS — G459 Transient cerebral ischemic attack, unspecified: Secondary | ICD-10-CM | POA: Insufficient documentation

## 2011-12-28 DIAGNOSIS — F29 Unspecified psychosis not due to a substance or known physiological condition: Secondary | ICD-10-CM | POA: Insufficient documentation

## 2011-12-28 MED ORDER — GADOBENATE DIMEGLUMINE 529 MG/ML IV SOLN
17.0000 mL | Freq: Once | INTRAVENOUS | Status: AC
  Administered 2011-12-28: 17 mL via INTRAVENOUS

## 2011-12-28 NOTE — Progress Notes (Signed)
Reviewed and agree.

## 2011-12-28 NOTE — Progress Notes (Signed)
Patient ID: Pamela Daniel MRN: [id number], DOB: 20-Nov-1950, 61 y.o. Date of Encounter: 12/28/2011, 10:42 AM  Primary Physician: Tally Due, MD  Chief Complaint: Follow up TIA, SOB, and fatigue.   HPI: 61 y.o. year old female with history below presents with for follow up of TIA, SOB, and fatigue. See office note from 12/25/11. In short patient was hospitalized from 12/21/11 to 12/22/11 for TIA. At that time symptoms had bene present for 1.5 days. Work up included head CT with mild partial opacification of the left maxillary sinus and right side of the sphenoid sinus otherwise without any acute intracranial pathology, TTE indicated normal systolic function, normal wall motion, and no cardiac source of emboli, and carotid dopplers that indicated no significant extracranial carotid artery stenosis. She was discharged on Lipitor 20 mg daily and ASA 81 mg daily.   She presented to [facility] on 12/25/11 with continued SOB and fatigue. Pulse ox at 95%. She was unable to perform ADL's secondary to her SOB and fatigue. At that visit she states she no longer had any symptoms concerning for her above TIA. She was sent for a CTA chest to rule out PE which was negative for PE. She was advised to perform incentive spirometry and increase her ASA to 2 tabs daily. She states performing deep breathing exercises has really helped with her breathing. She is no longer SOB, but still feels fatigued.   On her way to work this morning while she was driving she forgot how to turn on the defroster in her truck. She states she kept looking at the windshield wiper knob and road trying to figure out how to turn on the defroster. She states as she was about to pull over to be safe she figured out how to turn the defroster on. She estimates this took about 15-20 seconds. She also notes that she has had several instances where she has forgotten the code to the back door at work. She feels like her mind and body are not connected  at this time. She states that she is really having to focus and work hard to get her mind to "do anything." She is having to remind herself to eat and drink sometimes. She denies any chest pain or tachycardia, but does feel like her heart does skip a few beats every so often.   She is coming up on the one year anniversary of her mother's passing, but she is at peace with this. Her family is very supportive. They too have noticed a cognitive decline in her. She does have a referral for LE dopplers this week and also has an appointment with Dr. Jacinto Halim today for evaluation and treatment of the above.    Past Medical History  Diagnosis Date  . PSVT (paroxysmal supraventricular tachycardia)   . Hiatal hernia   . Hypertension   . Anxiety   . Asthma   . GERD (gastroesophageal reflux disease)   . Arthritis     SPINE     Home Meds: Prior to Admission medications   Medication Sig Start Date End Date Taking? Authorizing Provider  albuterol (PROVENTIL HFA;VENTOLIN HFA) 108 (90 BASE) MCG/ACT inhaler Inhale 2 puffs into the lungs every 4 (four) hours as needed. Shortness of breath/wheezing 12/25/11   Raymon Mutton Quiera Diffee, PA-C  ALPRAZolam Prudy Feeler) 0.25 MG tablet Take 0.5-1 tablets (0.125-0.25 mg total) by mouth daily as needed. For anxiety 12/25/11   Sondra Barges, PA-C  aspirin 81 MG chewable tablet Chew 1  tablet (81 mg total) by mouth daily. 12/22/11   Twana First Hess, DO  atorvastatin (LIPITOR) 20 MG tablet Take 1 tablet (20 mg total) by mouth daily. 12/22/11   Twana First Hess, DO  ibuprofen (ADVIL,MOTRIN) 200 MG tablet Take 400 mg by mouth every 6 (six) hours as needed. For pain    Historical Provider, MD  ranitidine (ZANTAC) 150 MG tablet Take 150 mg by mouth 2 (two) times daily as needed. For acid reflux    Historical Provider, MD  sulfamethoxazole-trimethoprim (BACTRIM DS) 800-160 MG per tablet Take 1 tablet by mouth every 12 (twelve) hours. 12/22/11   Briscoe Deutscher, DO    Allergies: No Known Allergies  History    Social History  . Marital Status: Divorced    Spouse Name: N/A    Number of Children: N/A  . Years of Education: N/A   Occupational History  . Not on file.   Social History Main Topics  . Smoking status: Never Smoker   . Smokeless tobacco: Never Used  . Alcohol Use: Yes     Comment: RARE  . Drug Use: No  . Sexually Active: Not on file   Other Topics Concern  . Not on file   Social History Narrative   Works at [employer]     Review of Systems: Constitutional: negative for chills, fever, night sweats, weight changes, or fatigue  HEENT: negative for vision changes, hearing loss, congestion, rhinorrhea, ST, epistaxis, or sinus pressure Cardiovascular: See above. negative for chest pain, orthopnea, or tachycardia  Respiratory: negative for hemoptysis, wheezing, shortness of breath, or cough Abdominal: negative for abdominal pain, nausea, vomiting, diarrhea, or constipation Dermatological: negative for rash Neurologic: See above. negative for headache, dizziness, paresthesias, weakness, or syncope   Physical Exam: Blood pressure 146/86, pulse 74, temperature 98 F (36.7 C), temperature source Oral, resp. rate 18, SpO2 99.00%., There is no height or weight on file to calculate BMI. General: Well developed, well nourished, in no acute distress. Head: Normocephalic, atraumatic, eyes without discharge, sclera non-icteric, pupils equal and reactive, no papilledema bilaterally. EOMI bilaterally. No nystagmus. Nares are without discharge. Bilateral auditory canals clear, TM's are without perforation, pearly grey and translucent with reflective cone of light bilaterally. Oral cavity moist, posterior pharynx without exudate, erythema, peritonsillar abscess, or post nasal drip. Uvula midline.  Neck: Supple. No thyromegaly. Full ROM. No lymphadenopathy. No bruits. Lungs: Clear bilaterally to auscultation without wheezes, rales, or rhonchi. Breathing is unlabored. Heart: RRR with  S1 S2. No murmurs, rubs, or gallops appreciated. Msk:  Strength and tone normal for age. Extremities/Skin: Warm and dry. No clubbing or cyanosis. No edema. No rashes or suspicious lesions. 5/5 strength through out all extremities. Capillary refill less than 2 seconds. Neuro: Alert and oriented X 3. Moves all extremities spontaneously. Gait is normal. DTR 2+ through out all extremities. Equal sensation throughout. Normal cerebellar function. Normal Rhomberg. CNII-XII grossly in tact. 30/30 on MMSE.  Psych:  Responds to questions appropriately with a normal affect.     ASSESSMENT AND PLAN:  61 y.o. year old female with confusion, TIA, SOB, and fatigue.   1. Confusion -No signs of active stroke/CVA -Non focal exam today -Paged on call neurologist to discuss case -No driving, she agrees to not drive -MRI brain with and without -MRA intracranial -Call report -Neurology referral  -Advised family to keep someone at home with her at all times  2. TIA -Neurology referral for evaluation and treatment -Once again no signs of  active stroke/CVA today -Continue Lipitor and ASA  -Advised family to keep someone at home with her at home with her at all times -911 precautions -No driving, she agrees to not drive  3. SOB and fatigue -Has appointment with Dr. Jacinto Halim today at 2:30 PM, family here to drive her -Much improved -Must consider need for possible TEE vs stress test vs cath, defer to cardiology -Consider Plavix, defer to cardiology again   4. Discussed with Dr. Neva Seat   Signed, Eula Listen, PA-C 12/28/2011 10:42 AM

## 2011-12-30 ENCOUNTER — Other Ambulatory Visit: Payer: Self-pay | Admitting: Physician Assistant

## 2011-12-30 DIAGNOSIS — R0602 Shortness of breath: Secondary | ICD-10-CM

## 2011-12-31 ENCOUNTER — Telehealth: Payer: Self-pay | Admitting: Radiology

## 2011-12-31 NOTE — Telephone Encounter (Signed)
Called patient, per Dr Perrin Maltese to see how she is doing. She feels okay she is taking it easy and is improving.

## 2011-12-31 NOTE — Progress Notes (Signed)
Patient discussed with Eula Listen, PAC. Marland Kitchen Agree with workup,  assessment and plan of care per his note.

## 2012-01-03 ENCOUNTER — Ambulatory Visit (HOSPITAL_COMMUNITY)
Admission: RE | Admit: 2012-01-03 | Discharge: 2012-01-03 | Disposition: A | Discharge status: Home / Self Care | Payer: 59 | Source: Ambulatory Visit | Attending: Physician Assistant | Admitting: Physician Assistant

## 2012-01-03 ENCOUNTER — Telehealth: Payer: Self-pay | Admitting: Radiology

## 2012-01-03 DIAGNOSIS — R0602 Shortness of breath: Secondary | ICD-10-CM

## 2012-01-03 NOTE — Telephone Encounter (Signed)
Thanks, called Lupita Leash has sent all information to Dr Craige Cotta

## 2012-01-03 NOTE — Progress Notes (Signed)
VASCULAR LAB PRELIMINARY  PRELIMINARY  PRELIMINARY  PRELIMINARY  Bilateral lower extremity venous Dopplers completed.    Preliminary report:  There is no DVT or SVT noted in the bilateral lower extremities.  Pamela Daniel, RVT 01/03/2012, 11:24 AM

## 2012-01-03 NOTE — Telephone Encounter (Signed)
Negative for DVT. Continue with current treatment plan and see neurologist on 01/04/12. Please make certain that Dr. Craige Cotta has all notes, labs, and imaging studies at her office for Nataleigh's appointment on 01/04/12.

## 2012-01-03 NOTE — Telephone Encounter (Signed)
I have gotten a call from the Vascular lab, patients study was negative for DVT< patient advised by Vascular lab study negative for DVT and she is advised we will call with further. Please advise.

## 2012-01-05 ENCOUNTER — Ambulatory Visit (INDEPENDENT_AMBULATORY_CARE_PROVIDER_SITE_OTHER): Payer: 59 | Admitting: Family Medicine

## 2012-01-05 ENCOUNTER — Telehealth: Payer: Self-pay | Admitting: Radiology

## 2012-01-05 VITALS — BP 146/78 | HR 76 | Temp 98.2°F | Resp 17 | Ht 61.5 in | Wt 177.0 lb

## 2012-01-05 DIAGNOSIS — R5381 Other malaise: Secondary | ICD-10-CM

## 2012-01-05 DIAGNOSIS — I10 Essential (primary) hypertension: Secondary | ICD-10-CM

## 2012-01-05 DIAGNOSIS — R0602 Shortness of breath: Secondary | ICD-10-CM

## 2012-01-05 DIAGNOSIS — G459 Transient cerebral ischemic attack, unspecified: Secondary | ICD-10-CM

## 2012-01-05 DIAGNOSIS — R5383 Other fatigue: Secondary | ICD-10-CM

## 2012-01-05 DIAGNOSIS — R42 Dizziness and giddiness: Secondary | ICD-10-CM

## 2012-01-05 MED ORDER — CLOPIDOGREL BISULFATE 75 MG PO TABS: 75.0000 mg | ORAL_TABLET | Freq: Every day | ORAL | Status: DC

## 2012-01-05 MED ORDER — NEBIVOLOL HCL 5 MG PO TABS: 5.0000 mg | ORAL_TABLET | Freq: Every day | ORAL | Status: DC

## 2012-01-05 NOTE — Progress Notes (Signed)
Patient ID: Pamela Daniel MRN: [id number], DOB: [birth year], 61 y.o. Date of Encounter: 01/05/2012, 2:09 PM  Primary Physician: Tally Due, MD  Chief Complaint: Follow up TIA, SOB, and fatigue  HPI: 61 y.o. year old female with history below presents for follow up of TIA, SOB, and fatigue. Since her office visit on 12/28/11 she has seen Dr. Sherril Croon, a cardiologist with Dr. Jacinto Halim, for evaluation of her SOB, TIA, and fatigue. At that visit her ASA was increased to 325 mg daily, Plavix 75 mg daily was added, she was scheduled for a TEE, and a 30 day cardiac monitor was placed on her. She has follow up with Dr. Jacinto Halim in 5 weeks time now from today's date. She also had her appointment with Dr. Craige Cotta, for neurology evaluation on 01/04/12. I do not yet have that office visit note, but patient reports that an EEG will be scheduled and she had a non-focal neuro exam. Her follow up with Dr. Craige Cotta will be  Today she comes into clinic to discuss what she should be further regarding her employment. She is concerned because she continues to have difficulty with planning which previously was not an issue for her. Since her last visit with me on 12/28/11 she has had two episodes of confusion. One occurred while at [facility] while at the drink machine. She was trying to get drink but kept turning and bumping the cup into her boyfriend's arm. A second occurrence happened at the house on a different day when her grandson was calling her name however she was not responding, but she reports that she thought she was responding. Patient's daughter states it took her grandson 4 tries of calling her name for her to actually respond. During these episodes she becomes nervous and almost panicked, unlike her first episode where the family reports she seemed "like she had drank alochol, or taken some of her nerve pills." She reports this was not the case. Her UDS at the ED at this time was also negative. Through out any  of her above symptoms she has never had a headache with the above. She has however had a headache when she gets stressed out that resolves when she calms down.   She reports several times per day she feels like her heart is skipping beats followed by some dizziness. She has been wearing her 30 day cardiac monitor when this occurs. She is not sure if it is actually palpitations or not. The dizziness last only a few seconds then self resolves. Interestingly, her family reports this episodes occur while she is doing her sewing. She denies any chest pain. She also continues to be short of breath and fatigued with prolonged walking. Her spirits are down a little with all of the above. This is wearing on her as she is usually the strong on in the family with members depending on her, now she is having to depend on them. She does have supportive family members and she does not feel alone in this illness.   She is accompanied by her daughter today who is driving her.    Past Medical History  Diagnosis Date  . PSVT (paroxysmal supraventricular tachycardia)   . Hiatal hernia   . Hypertension   . Anxiety   . Asthma   . GERD (gastroesophageal reflux disease)   . Arthritis     SPINE     Home Meds: Prior to Admission medications   Medication Sig Start Date End Date Taking? Authorizing  Provider  albuterol (PROVENTIL HFA;VENTOLIN HFA) 108 (90 BASE) MCG/ACT inhaler Inhale 2 puffs into the lungs every 4 (four) hours as needed. Shortness of breath/wheezing 12/25/11  Yes Ryan M Dunn, PA-C  ALPRAZolam Prudy Feeler) 0.25 MG tablet Take 0.5-1 tablets (0.125-0.25 mg total) by mouth daily as needed. For anxiety 12/25/11  Yes Ryan M Dunn, PA-C  aspirin 325 MG tablet Take 325 mg by mouth daily.   Yes Historical Provider, MD  atorvastatin (LIPITOR) 20 MG tablet Take 1 tablet (20 mg total) by mouth daily. 12/22/11  Yes Bryan R Hess, DO  clopidogrel (PLAVIX) 75 MG tablet Take 75 mg by mouth daily.   Yes Historical Provider, MD    ranitidine (ZANTAC) 150 MG tablet Take 150 mg by mouth 2 (two) times daily as needed. For acid reflux   Yes Historical Provider, MD                         Allergies: No Known Allergies  History   Social History  . Marital Status: Divorced    Spouse Name: N/A    Number of Children: N/A  . Years of Education: N/A   Occupational History  . Not on file.   Social History Main Topics  . Smoking status: Never Smoker   . Smokeless tobacco: Never Used  . Alcohol Use: Yes     Comment: RARE  . Drug Use: No  . Sexually Active: Not on file   Other Topics Concern  . Not on file   Social History Narrative   Works at [employer]     Review of Systems: Constitutional: Positive for fatigue. Negative for chills, fever, night sweats, or weight changes HEENT: Negative for vision changes, hearing loss, congestion, rhinorrhea, ST, epistaxis, or sinus pressure. Cardiovascular: Negative for chest pain, tachycardia, orthopnea, varicosities, diaphoresis, or edema. Respiratory: Positive for SOB. Negative for hemoptysis, wheezing, or cough. Abdominal: Negative for abdominal pain, nausea, vomiting, diarrhea, or constipation. Dermatological: Negative for rash, erythema, wound, or lesion. Neurologic: Positive for confusion and headache. Negative for or syncope, paresthesias, coordination problems, gait change, tremors, seizures, or vertigo. Psych: Positive for depression and anxiety. Negative for anhedonia, SI, or HI.  All other systems reviewed and are otherwise negative with the exception to those above and in the HPI.   Physical Exam: Blood pressure 146/78, pulse 76, temperature 98.2 F (36.8 C), temperature source Oral, resp. rate 17, height 5' 1.5" (1.562 m), weight 177 lb (80.287 kg), SpO2 97.00%., Body mass index is 32.90 kg/(m^2). General: Well developed, well nourished, in no acute distress. Head: Normocephalic, atraumatic, eyes without discharge, sclera non-icteric, EOMI, pupils  equil and reactive bilaterally. Nares are without discharge. Bilateral auditory canals clear, TM's are without perforation, pearly grey and translucent with reflective cone of light bilaterally. Oral cavity moist, posterior pharynx without exudate, erythema, peritonsillar abscess, or post nasal drip.  Neck: Supple. No thyromegaly. Full ROM. No lymphadenopathy. No carotid bruits.  Lungs: Clear bilaterally to auscultation without wheezes, rales, or rhonchi. Breathing is unlabored. Heart: RRR with S1 S2. No murmurs, rubs, or gallops appreciated. Msk:  Strength and tone normal for age. Extremities/Skin: Warm and dry. No clubbing or cyanosis. No edema. No rashes or suspicious lesions. 5/5 strength through out. Neuro: Alert and oriented X 3. Moves all extremities spontaneously. Gait is normal. CNII-XII grossly in tact. Normal sensation through out. DTR 2+ through out. Cerebellar function intact. Rhomberg normal.  Psych:  Responds to questions appropriately with a normal  affect.   EKG: No acute changes from prior.    ASSESSMENT AND PLAN:  61 y.o. year old female with history of TIA, SOB, fatigue, hypertension, palpitations, and dizziness. 1. TIA/SOB/fatigue/hypertension -Decrease aspirin to 81 mg daily -Continue Plavix 75 mg daily, new rx issued with 5 RF to have on file with pharmacy -Continue Lipitor 20 mg daily -Needs better blood pressure control, previously on Bystolic, however this was stopped due to good control after her ablation.  -Restart Bystolic 5 mg 1 po daily #30 RF 5 -Follow up with cardiology as directed, including studies -Follow up with neurology as directed, including studies -Return to clinic and ER precautions -Continue to have a family with her a home at all times -Continued no driving -Out of work until cleared -Advised her to fill out FMLA paperwork, name and number given  2. Palpitations and dizziness -Wearing 30 day monitor -Currently asymptomatic -Unchanged  EKG -Continue current treatment -Follow up with cardiology -911 precautions  3. Discussed with Dr. Katrinka Blazing  Signed, Eula Listen, PA-C 01/05/2012 2:09 PM

## 2012-01-05 NOTE — Telephone Encounter (Signed)
Called pt to advise she is to restart her Bystolic, I spoke to patients daughter Clayborne Dana. Alycia Rossetti has sent this to [employer].

## 2012-01-07 NOTE — Progress Notes (Signed)
HPI and physical exam reviewed in detail; case discussed in detail with Eula Listen, PA-C.  EKG reviewed.  Agree with current A/P.  KMS

## 2012-02-08 ENCOUNTER — Ambulatory Visit (HOSPITAL_COMMUNITY)
Admission: RE | Admit: 2012-02-08 | Discharge: 2012-02-08 | Disposition: A | Discharge status: Home / Self Care | Payer: 59 | Source: Ambulatory Visit | Attending: Neurology | Admitting: Neurology

## 2012-02-08 DIAGNOSIS — G459 Transient cerebral ischemic attack, unspecified: Secondary | ICD-10-CM | POA: Insufficient documentation

## 2012-02-08 NOTE — Procedures (Addendum)
ELECTROENCEPHALOGRAM REPORT   Patient: Pamela Daniel       Room #: OP EEG No. ID: [id number] Age: 62 y.o.        Sex: female Referring Physician: Craige Cotta Report Date:  02/08/2012        Interpreting Physician: Thana Farr D  History: Pamela Daniel is an 62 y.o. female with episodes of memory loss and slurred speech  Medications:  Plavix, ASA, Alprazolam, Ranitidine, Lipitor, ProAir  Conditions of Recording:  This is a 16 channel EEG carried out with the patient in the awake, drowsy and asleep states.  Description:  The waking background activity consists of a low voltage, symmetrical, fairly well organized, 10 Hz alpha activity, seen from the parieto-occipital and posterior temporal regions.  Low voltage fast activity, poorly organized, is seen anteriorly and is at times superimposed on more posterior regions.  A mixture of theta and alpha rhythms are seen from the central and temporal regions. The patient drowses with slowing to irregular, low voltage theta and beta activity.   The patient goes in to a light sleep with symmetrical sleep spindles, vertex central sharp transients and irregular slow activity.   Hyperventilation was not performed.  Intermittent photic stimulation was performed and elicits a symmetrical driving response but fails to elicit any abnormalities.  IMPRESSION: This is a normal electroencephalogram   Thana Farr, MD [facility] [phone] 02/08/2012, 10:16 PM

## 2012-02-08 NOTE — Progress Notes (Signed)
Sleep Deprived EEG completed.

## 2012-02-09 ENCOUNTER — Ambulatory Visit (INDEPENDENT_AMBULATORY_CARE_PROVIDER_SITE_OTHER): Payer: 59 | Admitting: Family Medicine

## 2012-02-09 VITALS — BP 145/77 | HR 78 | Temp 98.0°F | Resp 16 | Ht 60.0 in | Wt 179.8 lb

## 2012-02-09 DIAGNOSIS — R5381 Other malaise: Secondary | ICD-10-CM

## 2012-02-09 DIAGNOSIS — R5383 Other fatigue: Secondary | ICD-10-CM

## 2012-02-09 DIAGNOSIS — G459 Transient cerebral ischemic attack, unspecified: Secondary | ICD-10-CM

## 2012-02-09 DIAGNOSIS — R0602 Shortness of breath: Secondary | ICD-10-CM

## 2012-02-09 DIAGNOSIS — R410 Disorientation, unspecified: Secondary | ICD-10-CM

## 2012-02-09 DIAGNOSIS — F29 Unspecified psychosis not due to a substance or known physiological condition: Secondary | ICD-10-CM

## 2012-02-09 NOTE — Progress Notes (Signed)
Patient ID: Pamela Daniel MRN: [id number], DOB: [birth year], 62 y.o. Date of Encounter: 02/09/2012, 6:54 PM  Primary Physician: Tally Due, MD  Chief Complaint: Follow up confusion/TIA/SOB/fatigue  HPI: 62 y.o. year old female with history below presents for follow up of confusion/TIA/SOB/fatigue.  Confusion/TIA: No further symptoms. She last had an episode of confusion between 2-3 weeks ago. She states that she feels like herself and that she is "back." She had her EEG done on 02/08/12, results not yet back. No further paresthesias. No headaches. No dizziness.  She would like to come back to work, although not quite a full day yet.  SOB/fatigue: This continues to bother her. She will get winded walking from the parking lot to the building she is going to, where previously that was not the case. This is not associated with chest pain. Throughout her illness she has not been able to go and do as she pleases and has had to remain at the house. She knows that she has become somewhat deconditioned. No cough, weight gain, or extremity swelling. She does have a TEE scheduled with Dr. Jacinto Halim on 02/15/12 and a follow up with him on 02/24/12. She states that her cardiac monitor did not reveal any significant findings, I do not have that report here tonight.      Past Medical History  Diagnosis Date  . PSVT (paroxysmal supraventricular tachycardia)   . Hiatal hernia   . Hypertension   . Anxiety   . Asthma   . GERD (gastroesophageal reflux disease)   . Arthritis     SPINE     Home Meds: Prior to Admission medications   Medication Sig Start Date End Date Taking? Authorizing Provider  albuterol (PROVENTIL HFA;VENTOLIN HFA) 108 (90 BASE) MCG/ACT inhaler Inhale 2 puffs into the lungs every 4 (four) hours as needed. Shortness of breath/wheezing 12/25/11  Yes Ryan M Dunn, PA-C  ALPRAZolam Prudy Feeler) 0.25 MG tablet Take 0.5-1 tablets (0.125-0.25 mg total) by mouth daily as needed. For anxiety  12/25/11  Yes Ryan M Dunn, PA-C  aspirin 81 MG chewable tablet Chew 1 tablet (81 mg total) by mouth daily. 12/22/11  Yes Bryan R Hess, DO  atorvastatin (LIPITOR) 20 MG tablet Take 1 tablet (20 mg total) by mouth daily. 12/22/11  Yes Bryan R Hess, DO  clopidogrel (PLAVIX) 75 MG tablet Take 75 mg by mouth daily.   Yes Historical Provider, MD  clopidogrel (PLAVIX) 75 MG tablet Take 1 tablet (75 mg total) by mouth daily. 01/05/12  Yes Ryan M Dunn, PA-C  nebivolol (BYSTOLIC) 5 MG tablet Take 1 tablet (5 mg total) by mouth daily. 01/05/12  Yes Ryan M Dunn, PA-C  ranitidine (ZANTAC) 150 MG tablet Take 150 mg by mouth 2 (two) times daily as needed. For acid reflux   Yes Historical Provider, MD    Allergies: No Known Allergies  History   Social History  . Marital Status: Divorced    Spouse Name: N/A    Number of Children: N/A  . Years of Education: N/A   Occupational History  . Not on file.   Social History Main Topics  . Smoking status: Never Smoker   . Smokeless tobacco: Never Used  . Alcohol Use: Yes     Comment: RARE  . Drug Use: No  . Sexually Active: Not on file   Other Topics Concern  . Not on file   Social History Narrative   Works at [employer]     Review  of Systems: Constitutional: positive for fatigue. negative for chills, fever, night sweats, or weight changes  HEENT: negative for vision changes, hearing loss, congestion, rhinorrhea, ST, epistaxis, or sinus pressure Cardiovascular: negative for chest pain, DOE, orthopnea, edema, or palpitations Respiratory: positive for shortness of breath. negative for hemoptysis, wheezing, or cough Abdominal: negative for abdominal pain, nausea, vomiting, or diarrhea Dermatological: negative for rash Neurologic: negative for headache, dizziness, syncope, paresthesias, or confusion All other systems reviewed and are otherwise negative with the exception to those above and in the HPI.   Physical Exam: Blood pressure 145/77,  pulse 78, temperature 98 F (36.7 C), temperature source Oral, resp. rate 16, height 5' (1.524 m), weight 179 lb 12.8 oz (81.557 kg), SpO2 98.00%., Body mass index is 35.11 kg/(m^2). General: Well developed, well nourished, in no acute distress. Well appearing.  Head: Normocephalic, atraumatic, eyes without discharge, sclera non-icteric, nares are without discharge. Bilateral auditory canals clear, TM's are without perforation, pearly grey and translucent with reflective cone of light bilaterally. Oral cavity moist, posterior pharynx without exudate, erythema, peritonsillar abscess, or post nasal drip. Uvula midline.   Neck: Supple. No thyromegaly. Full ROM. No lymphadenopathy. No bruits.  Lungs: Clear bilaterally to auscultation without wheezes, rales, or rhonchi. Breathing is unlabored. Heart: RRR with S1 S2. No murmurs, rubs, or gallops appreciated. Msk:  Strength and tone normal for age. Extremities/Skin: Warm and dry. No clubbing or cyanosis. No edema. No rashes or suspicious lesions. 5/5 strength throughout.  Neuro: Alert and oriented X 3. Moves all extremities spontaneously. Gait is normal. CNII-XII grossly in tact. DTR 2+ throughout all extremities. Cerebellar function intact. Normal sensation throughout.  Psych:  Responds to questions appropriately with a normal affect.     ASSESSMENT AND PLAN:  62 y.o. year old female with SOB, fatigue, and resolved confusion/TIA symptoms 1) SOB/fatigue -Longstanding, unchanged -No acute findings today -Has appointment with Dr. Jacinto Halim on 02/24/12 -She reports cardiac monitor did not reveal any acute findings, I do not have these results tonight  -TEE schedule for 02/15/12 -Advised patient to discuss with Dr. Jacinto Halim to possibility of a stress test -Discussed with patient some of this may be medication/deconditioning related  -RTC/ER precautions   2) Confusion/TIA symptoms -Resolved -No symptoms in 2-3 weeks -She did have her EEG on 02/08/12, results  not yet available -Continue current medications -Must have optimal blood pressure control and lipid control  -Would like to return to work at a half shift capacity, 3 hours at a time. Released to return to work at this capacity. She has been advised that should she feel uneasy, uncomfortable, confused, or for any reason at all while at work she is to notify the closest employee for assistance. She understands this and agrees to this.  -Cleared to drive at this time as she has not any signs or symptoms of her confusion or TIA like activity. She has been advised should she develop any worrisome symptoms to pullover to the side of the road and call for assistance.  -We will reassess her progress in one week, sooner if needed   3) Discussed with Dr. Neva Seat   Signed, Eula Listen, PA-C 02/09/2012 6:54 PM

## 2012-02-10 ENCOUNTER — Encounter: Payer: Self-pay | Admitting: Physician Assistant

## 2012-02-10 NOTE — Progress Notes (Signed)
patient discussed with Eula Listen, PA-C. Agree with assessment and plan of care per his note. EEG results reviewed - normal EEG. Agree with trial of rtw with precautions as discussed.

## 2012-02-15 ENCOUNTER — Encounter (HOSPITAL_COMMUNITY)
Admission: RE | Disposition: A | Discharge status: Home / Self Care | Payer: Self-pay | Source: Ambulatory Visit | Attending: Cardiology

## 2012-02-15 ENCOUNTER — Encounter (HOSPITAL_COMMUNITY): Payer: Self-pay | Admitting: *Deleted

## 2012-02-15 ENCOUNTER — Ambulatory Visit (HOSPITAL_COMMUNITY)
Admission: RE | Admit: 2012-02-15 | Discharge: 2012-02-15 | Disposition: A | Discharge status: Home / Self Care | Payer: 59 | Source: Ambulatory Visit | Attending: Cardiology | Admitting: Cardiology

## 2012-02-15 DIAGNOSIS — G459 Transient cerebral ischemic attack, unspecified: Secondary | ICD-10-CM | POA: Insufficient documentation

## 2012-02-15 DIAGNOSIS — I10 Essential (primary) hypertension: Secondary | ICD-10-CM | POA: Insufficient documentation

## 2012-02-15 HISTORY — PX: TEE WITHOUT CARDIOVERSION: SHX5443

## 2012-02-15 SURGERY — ECHOCARDIOGRAM, TRANSESOPHAGEAL
Anesthesia: Moderate Sedation

## 2012-02-15 MED ORDER — FENTANYL CITRATE 0.05 MG/ML IJ SOLN
INTRAMUSCULAR | Status: AC
  Filled 2012-02-15: qty 2

## 2012-02-15 MED ORDER — FENTANYL CITRATE 0.05 MG/ML IJ SOLN
INTRAMUSCULAR | Status: DC | PRN
  Administered 2012-02-15: 25 ug via INTRAVENOUS

## 2012-02-15 MED ORDER — MIDAZOLAM HCL 5 MG/ML IJ SOLN
INTRAMUSCULAR | Status: AC
  Filled 2012-02-15: qty 2

## 2012-02-15 MED ORDER — MIDAZOLAM HCL 10 MG/2ML IJ SOLN
INTRAMUSCULAR | Status: DC | PRN
  Administered 2012-02-15: 2 mg via INTRAVENOUS

## 2012-02-15 MED ORDER — BUTAMBEN-TETRACAINE-BENZOCAINE 2-2-14 % EX AERO
INHALATION_SPRAY | CUTANEOUS | Status: DC | PRN
  Administered 2012-02-15: 2 via TOPICAL

## 2012-02-15 MED ORDER — SODIUM CHLORIDE 0.9 % IV SOLN
INTRAVENOUS | Status: DC
  Administered 2012-02-15: 500 mL via INTRAVENOUS

## 2012-02-15 NOTE — Progress Notes (Signed)
  Echocardiogram Echocardiogram Transesophageal has been performed.  Pamela Daniel A 02/15/2012, 12:56 PM

## 2012-02-15 NOTE — Interval H&P Note (Signed)
History and Physical Interval Note:  02/15/2012 12:18 PM  Pamela Daniel  has presented today for surgery, with the diagnosis of tia  The various methods of treatment have been discussed with the patient and family. After consideration of risks, benefits and other options for treatment, the patient has consented to  Procedure(s) (LRB) with comments: TRANSESOPHAGEAL ECHOCARDIOGRAM (TEE) (N/A) as a surgical intervention .  The patient's history has been reviewed, patient examined, no change in status, stable for surgery.  I have reviewed the patient's chart and labs.  Questions were answered to the patient's satisfaction.     [name]  4th year medical student [employer] is observing the procedure and patient consents

## 2012-02-15 NOTE — CV Procedure (Signed)
TEE: Under moderate sedation, TEE was performed without complications: LV: Normal. Normal EF. RV: Normal LA: Normal. Left atrial appendage: Normal without thrombus. Normal function. Inter atrial septum is intact without defect. Double contrast study negative for atrial level shunting. No late appearance of bubbles either. RA: Normal  MV: Normal Mild  MR. TV: Normal Trace TR AV: Normal. No AI or AS. PV: Normal. Trace PI.  Thoracic and ascending aorta: Normal without significant plaque or atheromatous changes.

## 2012-02-15 NOTE — H&P (Signed)
  Please see paper chart  

## 2012-02-16 ENCOUNTER — Other Ambulatory Visit: Payer: Self-pay | Admitting: Physician Assistant

## 2012-02-16 ENCOUNTER — Encounter (HOSPITAL_COMMUNITY): Payer: Self-pay | Admitting: Cardiology

## 2012-02-16 NOTE — Progress Notes (Signed)
I received an email from [employer] requesting a return to work note. Note completed.   Eula Listen, PA-C 02/16/2012 8:39 AM

## 2012-03-03 ENCOUNTER — Ambulatory Visit (INDEPENDENT_AMBULATORY_CARE_PROVIDER_SITE_OTHER): Payer: 59 | Admitting: Physician Assistant

## 2012-03-03 VITALS — BP 125/77 | HR 88 | Temp 98.2°F | Resp 17 | Ht 60.0 in | Wt 181.0 lb

## 2012-03-03 DIAGNOSIS — M549 Dorsalgia, unspecified: Secondary | ICD-10-CM

## 2012-03-03 DIAGNOSIS — M62830 Muscle spasm of back: Secondary | ICD-10-CM

## 2012-03-03 DIAGNOSIS — M5380 Other specified dorsopathies, site unspecified: Secondary | ICD-10-CM

## 2012-03-03 MED ORDER — CYCLOBENZAPRINE HCL 10 MG PO TABS: 10.0000 mg | ORAL_TABLET | Freq: Three times a day (TID) | ORAL | Status: DC | PRN

## 2012-03-03 NOTE — Progress Notes (Signed)
Patient ID: Pamela Daniel MRN: [id number], DOB: [birth year], 62 y.o. Date of Encounter: 03/03/2012, 12:37 PM  Primary Physician: Tally Due, MD  Chief Complaint: Back pain  HPI: 62 y.o. year old female with history below presents with two day history of mid back pain and back spasm. Patient was sleeping two nights ago and when she rolled onto her left side she felt her mid back catch. Since this time she has had off and on mid back spasms and pain. Spasms and pain will last for 10-15 minutes and self resolve if she is completely still. However, if she moves the back will become sore. Pain does radiate along the bilateral sides, but not superiorly or inferiorly. No injury or trauma. No loss or bowel function. No numbness, tingling, or weakness in her legs. No chest pain, abdominal pain, nausea, vomiting, or diaphoresis.   Patient states that Dr. Jacinto Halim has mentioned to her the possibility of narcolepsy as an etiology of her symptoms this past fall/winter. She has not yet gotten in touch with her neurologist to discuss this. She does however state that over the years she has been "caught cooking in her sleep." Overall she states that she is feeling much better, just a little tired.    Past Medical History  Diagnosis Date  . PSVT (paroxysmal supraventricular tachycardia)   . Hiatal hernia   . Hypertension   . Anxiety   . Asthma   . GERD (gastroesophageal reflux disease)   . Arthritis     SPINE  . TIA (transient ischemic attack)   . SVT (supraventricular tachycardia)      Home Meds: Prior to Admission medications   Medication Sig Start Date End Date Taking? Authorizing Provider  albuterol (PROVENTIL HFA;VENTOLIN HFA) 108 (90 BASE) MCG/ACT inhaler Inhale 2 puffs into the lungs every 4 (four) hours as needed. Shortness of breath/wheezing 12/25/11  Yes Skarlett Sedlacek M Harding Thomure, PA-C  ALPRAZolam Prudy Feeler) 0.25 MG tablet Take 0.5-1 tablets (0.125-0.25 mg total) by mouth daily as needed. For anxiety  12/25/11  Yes Chinmay Squier M Twinkle Sockwell, PA-C  aspirin 81 MG chewable tablet Chew 1 tablet (81 mg total) by mouth daily. 12/22/11  Yes Bryan R Hess, DO  atorvastatin (LIPITOR) 20 MG tablet Take 1 tablet (20 mg total) by mouth daily. 12/22/11  Yes Bryan R Hess, DO         clopidogrel (PLAVIX) 75 MG tablet Take 1 tablet (75 mg total) by mouth daily. 01/05/12  Yes Irfan Veal M Kaitlyn Skowron, PA-C  nebivolol (BYSTOLIC) 5 MG tablet Take 1 tablet (5 mg total) by mouth daily. 01/05/12  Yes Sadat Sliwa M Ajiah Mcglinn, PA-C  ranitidine (ZANTAC) 150 MG tablet Take 150 mg by mouth 2 (two) times daily as needed. For acid reflux   Yes Historical Provider, MD    Allergies: No Known Allergies  History   Social History  . Marital Status: Divorced    Spouse Name: N/A    Number of Children: N/A  . Years of Education: N/A   Occupational History  . Not on file.   Social History Main Topics  . Smoking status: Never Smoker   . Smokeless tobacco: Never Used  . Alcohol Use: Yes     Comment: RARE  . Drug Use: No  . Sexually Active: Yes   Other Topics Concern  . Not on file   Social History Narrative   Works at [employer]     Review of Systems: Constitutional: positive for fatigue. negative for chills, fever,  night sweats, or weight changes  Cardiovascular: negative for chest pain or palpitations Respiratory: negative for hemoptysis, wheezing, shortness of breath, or cough Abdominal: negative for abdominal pain, nausea, vomiting, diarrhea, or constipation Musculoskeletal: positive for pain, and spasm Dermatological: negative for rash, wound, or lesion Neurologic: negative for headache, dizziness, or syncope All other systems reviewed and are otherwise negative with the exception to those above and in the HPI.   Physical Exam: Blood pressure 125/77, pulse 88, temperature 98.2 F (36.8 C), temperature source Oral, resp. rate 17, height 5' (1.524 m), weight 181 lb (82.101 kg), SpO2 96.00%., Body mass index is 35.35  kg/(m^2). General: Well developed, well nourished, in no acute distress. Head: Normocephalic, atraumatic, eyes without discharge, sclera non-icteric, nares are without discharge. Bilateral auditory canals clear, TM's are without perforation, pearly grey and translucent with reflective cone of light bilaterally. Oral cavity moist, posterior pharynx without exudate, erythema, peritonsillar abscess, or post nasal drip.  Neck: Supple. No thyromegaly. Full ROM. No lymphadenopathy. Lungs: Clear bilaterally to auscultation without wheezes, rales, or rhonchi. Breathing is unlabored. Heart: RRR with S1 S2. No murmurs, rubs, or gallops appreciated. Back: No midline or bony TTP. TTP thoracic paraspinal muscles. Decreased flexion and rotation secondary to pain.  Msk:  Strength and tone normal for age. 5/5 strength bilateral lower extremities.  Extremities/Skin: Warm and dry. No clubbing or cyanosis. No edema. No rashes or suspicious lesions. Neuro: Alert and oriented X 3. Moves all extremities spontaneously. Gait is normal. CNII-XII grossly in tact. DTR 2+ and equal bilateral lower extremities.  Psych:  Responds to questions appropriately with a normal affect.     ASSESSMENT AND PLAN:  62 y.o. female with back spasm, back pain, and fatigue  1) Back spasm and back pain -Flexeril 10 mg 1 po tid prn #30 RF 5, patient to split in half -Side effects discussed -Use heat pack -Rest -Follow up prn  2) Fatigue -Improving -Follow up with her neurologist  Signed, Eula Listen, PA-C 03/03/2012 12:37 PM

## 2012-03-10 ENCOUNTER — Ambulatory Visit (INDEPENDENT_AMBULATORY_CARE_PROVIDER_SITE_OTHER): Payer: 59 | Admitting: Physician Assistant

## 2012-03-10 VITALS — BP 148/96 | HR 90 | Temp 98.1°F | Resp 20 | Ht 60.5 in | Wt 175.2 lb

## 2012-03-10 DIAGNOSIS — R5381 Other malaise: Secondary | ICD-10-CM

## 2012-03-10 DIAGNOSIS — Z8673 Personal history of transient ischemic attack (TIA), and cerebral infarction without residual deficits: Secondary | ICD-10-CM

## 2012-03-10 DIAGNOSIS — R0602 Shortness of breath: Secondary | ICD-10-CM

## 2012-03-10 LAB — POCT CBC
Granulocyte percent: 59.2 %G (ref 37–80)
MCV: 95.4 fL (ref 80–97)
MID (cbc): 0.7 (ref 0–0.9)
MPV: 10.9 fL (ref 0–99.8)
Platelet Count, POC: 287 10*3/uL (ref 142–424)
RBC: 4.98 M/uL (ref 4.04–5.48)

## 2012-03-10 LAB — VITAMIN B12: Vitamin B-12: 541 pg/mL (ref 211–911)

## 2012-03-10 NOTE — Progress Notes (Signed)
Patient ID: Pamela Daniel MRN: [id number], DOB: 08-26-1950, 62 y.o. Date of Encounter: 03/10/2012, 3:15 PM  Primary Physician: Tally Due, MD  Chief Complaint: Update  HPI: 62 y.o. female with history below presents for update. Patient would like to update me on her decision to resign from [employer]. Patient has ultimately decided to resign as of 03/13/12 secondary to focus on her healthcare needs. Patient states that the 6 hour shifts here are too long and that she is too sore and fatigued at the end of the day to continue at this pace. She is worried that if she continues employment here that she will continue with a cycle of waxing and waning symptoms. 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 She overall does feel better, but has had a couple of short confused episodes since I last saw her. She has not yet contacted her neurologist to discuss the possibility of narcolepsy, but does plan to do this. She does mention that her maternal grandmother was "placed in an asylum for pernicious anemia," her mother had pernicious anemia, and she would like to have her B12 checked as well.    Her cardiologist did mention in his last office visit note that he felt like her fatigue/shortness of breath/dypsnea were related to sleep disordered breathing, and that he could not exclude narcolepsy. He has advised her to follow up with her neurologist. Upon learning this the patient mentions to me more stories of "cooking in her sleep, walking and talking in her sleep, and feeding her baby in her sleep."   Since making her above resignation from [employer] she has felt much better. She knows this was the correct choice.      Past Medical History  Diagnosis Date  . PSVT (paroxysmal supraventricular tachycardia)   . Hiatal hernia   . Hypertension   . Anxiety   . Asthma   . GERD (gastroesophageal reflux disease)   . Arthritis     SPINE  . TIA (transient  ischemic attack)   . SVT (supraventricular tachycardia)      Home Meds: Prior to Admission medications   Medication Sig Start Date End Date Taking? Authorizing Provider  albuterol (PROVENTIL HFA;VENTOLIN HFA) 108 (90 BASE) MCG/ACT inhaler Inhale 2 puffs into the lungs every 4 (four) hours as needed. Shortness of breath/wheezing 12/25/11  Yes Ryan M Dunn, PA-C  ALPRAZolam Prudy Feeler) 0.25 MG tablet Take 0.5-1 tablets (0.125-0.25 mg total) by mouth daily as needed. For anxiety 12/25/11  Yes Ryan M Dunn, PA-C  aspirin 81 MG chewable tablet Chew 1 tablet (81 mg total) by mouth daily. 12/22/11  Yes Bryan R Hess, DO  atorvastatin (LIPITOR) 20 MG tablet Take 1 tablet (20 mg total) by mouth daily. 12/22/11  Yes Bryan R Hess, DO  clopidogrel (PLAVIX) 75 MG tablet Take 75 mg by mouth daily.   Yes Historical Provider, MD  clopidogrel (PLAVIX) 75 MG tablet Take 1 tablet (75 mg total) by mouth daily. 01/05/12  Yes Ryan M Dunn, PA-C  cyclobenzaprine (FLEXERIL) 10 MG tablet Take 1 tablet (10 mg total) by mouth 3 (three) times daily as needed for muscle spasms. 03/03/12  Yes Ryan M Dunn, PA-C  nebivolol (BYSTOLIC) 5 MG tablet Take 1 tablet (5 mg total) by mouth daily. 01/05/12  Yes Ryan M Dunn, PA-C  ranitidine (ZANTAC) 150 MG tablet Take 150 mg by mouth 2 (two) times daily as needed. For acid reflux  Yes Historical Provider, MD    Allergies: No Known Allergies  History   Social History  . Marital Status: Divorced    Spouse Name: N/A    Number of Children: N/A  . Years of Education: N/A   Occupational History  . Not on file.   Social History Main Topics  . Smoking status: Never Smoker   . Smokeless tobacco: Never Used  . Alcohol Use: Yes     Comment: RARE  . Drug Use: No  . Sexually Active: Yes   Other Topics Concern  . Not on file   Social History Narrative   Works at [employer]     Review of Systems: Constitutional: positive for fatigue. negative for chills, fever, night sweats, or  weight changes  HEENT: negative for vision changes, hearing loss, congestion, rhinorrhea, ST, epistaxis, or sinus pressure Cardiovascular: negative for chest pain, edema, tachycardia, orthopnea, DOE, or palpitations Respiratory: positive for shortness of breath. negative for hemoptysis, wheezing, or cough Abdominal: negative for abdominal pain, nausea, vomiting, diarrhea, or constipation Dermatological: negative for rash Neurologic: negative for headache, dizziness, or syncope   Physical Exam: Blood pressure 148/96, pulse 90, temperature 98.1 F (36.7 C), temperature source Oral, resp. rate 20, height 5' 0.5" (1.537 m), weight 175 lb 3.2 oz (79.47 kg), SpO2 98.00%., Body mass index is 33.64 kg/(m^2). General: Well developed, well nourished, in no acute distress. Head: Normocephalic, atraumatic, eyes without discharge, sclera non-icteric, nares are without discharge. Bilateral auditory canals clear, TM's are without perforation, pearly grey and translucent with reflective cone of light bilaterally. Oral cavity moist, posterior pharynx without exudate, erythema, peritonsillar abscess, or post nasal drip. Uvula midline.    Neck: Supple. No thyromegaly. Full ROM. No lymphadenopathy. No bruits.  Lungs: Clear bilaterally to auscultation without wheezes, rales, or rhonchi. Breathing is unlabored. Heart: RRR with S1 S2. No murmurs, rubs, or gallops appreciated. Msk:  Strength and tone normal for age. Extremities/Skin: Warm and dry. No clubbing or cyanosis. No edema. No rashes or suspicious lesions. Neuro: Alert and oriented X 3. Moves all extremities spontaneously. Gait is normal. CNII-XII grossly in tact. Psych:  Responds to questions appropriately with a normal affect.   Labs: Results for orders placed in visit on 03/10/12  POCT CBC      Result Value Range   WBC 7.0  4.6 - 10.2 K/uL   Lymph, poc 2.2  0.6 - 3.4   POC LYMPH PERCENT 31.5  10 - 50 %L   MID (cbc) 0.7  0 - 0.9   POC MID % 9.3  0 -  12 %M   POC Granulocyte 4.1  2 - 6.9   Granulocyte percent 59.2  37 - 80 %G   RBC 4.98  4.04 - 5.48 M/uL   Hemoglobin 15.6  12.2 - 16.2 g/dL   HCT, POC 19.1  47.8 - 47.9 %   MCV 95.4  80 - 97 fL   MCH, POC 31.3 (*) 27 - 31.2 pg   MCHC 32.8  31.8 - 35.4 g/dL   RDW, POC 29.5     Platelet Count, POC 287  142 - 424 K/uL   MPV 10.9  0 - 99.8 fL    B12 and MMA pending  ASSESSMENT AND PLAN:  62 y.o. female with fatigue, shortness of breath, confusion, and history of TIA -Advised patient to follow up with her neurologist for possible narcolepsy evaluation, she agrees to do so -Await above labs -Fatigue and shortness of breath, felt  to be sleep disordered breathing, follow up per above. It is felt that if she continues to have shortness of breath that she will need cardiac catheterization to definitively exclude coronary artery disease and pulmonary hypertension. Clinical evidence does not appear to suggest this. Please also see echo from early December.  -Continue current medications -Optimal blood pressure control -Repeat fasting blood work in June 2014    Signed, Eula Listen, Cordelia Poche 03/10/2012 3:15 PM

## 2012-03-14 ENCOUNTER — Telehealth: Payer: Self-pay

## 2012-03-14 ENCOUNTER — Encounter: Payer: Self-pay | Admitting: *Deleted

## 2012-03-14 LAB — METHYLMALONIC ACID, SERUM: Methylmalonic Acid, Quant: 0.27 umol/L (ref <0.40)

## 2012-03-14 NOTE — Telephone Encounter (Signed)
Patient advised labs are normal and copy was mailed to patient.  Patient states understanding.

## 2012-03-17 ENCOUNTER — Other Ambulatory Visit: Payer: Self-pay | Admitting: Physician Assistant

## 2012-05-25 ENCOUNTER — Telehealth: Payer: Self-pay

## 2012-05-25 NOTE — Telephone Encounter (Signed)
Patient would like Chelle to call her at [phone] whenever possible.

## 2013-02-12 ENCOUNTER — Emergency Department (HOSPITAL_COMMUNITY)
Admission: EM | Admit: 2013-02-12 | Discharge: 2013-02-12 | Disposition: A | Discharge status: Home / Self Care | Payer: 59 | Attending: Emergency Medicine | Admitting: Emergency Medicine

## 2013-02-12 DIAGNOSIS — Y92009 Unspecified place in unspecified non-institutional (private) residence as the place of occurrence of the external cause: Secondary | ICD-10-CM | POA: Insufficient documentation

## 2013-02-12 DIAGNOSIS — X12XXXA Contact with other hot fluids, initial encounter: Secondary | ICD-10-CM | POA: Insufficient documentation

## 2013-02-12 DIAGNOSIS — Z8719 Personal history of other diseases of the digestive system: Secondary | ICD-10-CM | POA: Insufficient documentation

## 2013-02-12 DIAGNOSIS — J45909 Unspecified asthma, uncomplicated: Secondary | ICD-10-CM | POA: Insufficient documentation

## 2013-02-12 DIAGNOSIS — Y9389 Activity, other specified: Secondary | ICD-10-CM | POA: Insufficient documentation

## 2013-02-12 DIAGNOSIS — M129 Arthropathy, unspecified: Secondary | ICD-10-CM | POA: Insufficient documentation

## 2013-02-12 DIAGNOSIS — Z8673 Personal history of transient ischemic attack (TIA), and cerebral infarction without residual deficits: Secondary | ICD-10-CM | POA: Insufficient documentation

## 2013-02-12 DIAGNOSIS — Z79899 Other long term (current) drug therapy: Secondary | ICD-10-CM | POA: Insufficient documentation

## 2013-02-12 DIAGNOSIS — T23209A Burn of second degree of unspecified hand, unspecified site, initial encounter: Secondary | ICD-10-CM | POA: Insufficient documentation

## 2013-02-12 DIAGNOSIS — Z7982 Long term (current) use of aspirin: Secondary | ICD-10-CM | POA: Insufficient documentation

## 2013-02-12 DIAGNOSIS — X131XXA Other contact with steam and other hot vapors, initial encounter: Secondary | ICD-10-CM

## 2013-02-12 DIAGNOSIS — I10 Essential (primary) hypertension: Secondary | ICD-10-CM | POA: Insufficient documentation

## 2013-02-12 DIAGNOSIS — Z8659 Personal history of other mental and behavioral disorders: Secondary | ICD-10-CM | POA: Insufficient documentation

## 2013-02-12 DIAGNOSIS — T23201A Burn of second degree of right hand, unspecified site, initial encounter: Secondary | ICD-10-CM

## 2013-02-12 MED ORDER — OXYCODONE-ACETAMINOPHEN 5-325 MG PO TABS: ORAL_TABLET | ORAL | Status: DC

## 2013-02-12 MED ORDER — MORPHINE SULFATE 4 MG/ML IJ SOLN
4.0000 mg | Freq: Once | INTRAMUSCULAR | Status: AC
  Administered 2013-02-12: 4 mg via INTRAVENOUS
  Filled 2013-02-12: qty 1

## 2013-02-12 MED ORDER — SILVER SULFADIAZINE 1 % EX CREA
TOPICAL_CREAM | Freq: Once | CUTANEOUS | Status: AC
Start: 2013-02-12 — End: 2013-02-12
  Administered 2013-02-12: 18:00:00 via TOPICAL
  Filled 2013-02-12: qty 85

## 2013-02-12 NOTE — ED Notes (Signed)
Pt placed on 2L [place] dt pain medication
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
## 2013-02-12 NOTE — ED Notes (Signed)
Cream applied to hand, sterile dressing applied and coban applied. Family and pt provided with education concerning hand care and s/s infection. Pt tolerated well.

## 2013-02-12 NOTE — Discharge Instructions (Signed)
Burn Care Your skin is a natural barrier to infection. It is the largest organ of your body. Burns damage this natural protection. To help prevent infection, it is very important to follow your caregiver's instructions in the care of your burn. Burns are classified as:  First degree. There is only redness of the skin (erythema). No scarring is expected.  Second degree. There is blistering of the skin. Scarring may occur with deeper burns.  Third degree. All layers of the skin are injured, and scarring is expected. HOME CARE INSTRUCTIONS   Wash your hands well before changing your bandage.  Change your bandage as often as directed by your caregiver.  Remove the old bandage. If the bandage sticks, you may soak it off with cool, clean water.  Cleanse the burn thoroughly but gently with mild soap and water.  Pat the area dry with a clean, dry cloth.  Apply a thin layer of antibacterial cream to the burn.  Apply a clean bandage as instructed by your caregiver.  Keep the bandage as clean and dry as possible.  Elevate the affected area for the first 24 hours, then as instructed by your caregiver.  Only take over-the-counter or prescription medicines for pain, discomfort, or fever as directed by your caregiver. SEEK IMMEDIATE MEDICAL CARE IF:   You develop excessive pain.  You develop redness, tenderness, swelling, or red streaks near the burn.  The burned area develops yellowish-white fluid (pus) or a bad smell.  You have a fever. MAKE SURE YOU:   Understand these instructions.  Will watch your condition.  Will get help right away if you are not doing well or get worse. Document Released: 01/04/2005 Document Revised: 03/29/2011 Document Reviewed: 05/27/2010 [facility] Patient Information 2014 [place], [place].     Narcotic and benzodiazepine use may cause drowsiness, slowed breathing or dependence.  Please use with caution and do not drive, operate machinery or watch young  children alone while taking them.  Taking combinations of these medications or drinking alcohol will potentiate these effects.

## 2013-02-12 NOTE — ED Notes (Signed)
Hand cleansed thoroughly with 3L saline water. Pt tolerated well. Hand smells of grease, but less saline clear by last liter of fluid. Pt denies any pain at this time.
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
## 2013-02-22 ENCOUNTER — Encounter (HOSPITAL_BASED_OUTPATIENT_CLINIC_OR_DEPARTMENT_OTHER): Payer: 59 | Attending: General Surgery

## 2013-02-22 DIAGNOSIS — T23209A Burn of second degree of unspecified hand, unspecified site, initial encounter: Secondary | ICD-10-CM | POA: Insufficient documentation

## 2013-02-22 DIAGNOSIS — X131XXA Other contact with steam and other hot vapors, initial encounter: Secondary | ICD-10-CM

## 2013-02-22 DIAGNOSIS — Z90710 Acquired absence of both cervix and uterus: Secondary | ICD-10-CM | POA: Insufficient documentation

## 2013-02-22 DIAGNOSIS — Z8673 Personal history of transient ischemic attack (TIA), and cerebral infarction without residual deficits: Secondary | ICD-10-CM | POA: Insufficient documentation

## 2013-02-22 DIAGNOSIS — X12XXXA Contact with other hot fluids, initial encounter: Secondary | ICD-10-CM | POA: Insufficient documentation

## 2013-02-22 DIAGNOSIS — I10 Essential (primary) hypertension: Secondary | ICD-10-CM | POA: Insufficient documentation
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
## 2013-12-25 DIAGNOSIS — Z0271 Encounter for disability determination: Secondary | ICD-10-CM

## 2014-01-18 DIAGNOSIS — Z87411 Personal history of vaginal dysplasia: Secondary | ICD-10-CM

## 2014-01-18 HISTORY — DX: Personal history of vaginal dysplasia: Z87.411

## 2014-03-15 ENCOUNTER — Ambulatory Visit (INDEPENDENT_AMBULATORY_CARE_PROVIDER_SITE_OTHER): Payer: Medicare Other | Admitting: Family Medicine

## 2014-03-15 ENCOUNTER — Encounter: Payer: Self-pay | Admitting: Family Medicine

## 2014-03-15 VITALS — BP 147/78 | HR 75 | Temp 97.6°F | Resp 16 | Ht 61.0 in | Wt 167.0 lb

## 2014-03-15 DIAGNOSIS — K625 Hemorrhage of anus and rectum: Secondary | ICD-10-CM

## 2014-03-15 DIAGNOSIS — N993 Prolapse of vaginal vault after hysterectomy: Secondary | ICD-10-CM

## 2014-03-15 DIAGNOSIS — Z1159 Encounter for screening for other viral diseases: Secondary | ICD-10-CM

## 2014-03-15 DIAGNOSIS — I10 Essential (primary) hypertension: Secondary | ICD-10-CM

## 2014-03-15 DIAGNOSIS — R32 Unspecified urinary incontinence: Secondary | ICD-10-CM

## 2014-03-15 DIAGNOSIS — R0602 Shortness of breath: Secondary | ICD-10-CM

## 2014-03-15 DIAGNOSIS — K5902 Outlet dysfunction constipation: Secondary | ICD-10-CM

## 2014-03-15 LAB — COMPLETE METABOLIC PANEL WITH GFR
ALBUMIN: 4.1 g/dL (ref 3.5–5.2)
ALK PHOS: 61 U/L (ref 39–117)
ALT: 16 U/L (ref 0–35)
AST: 17 U/L (ref 0–37)
BUN: 12 mg/dL (ref 6–23)
CALCIUM: 9.1 mg/dL (ref 8.4–10.5)
CO2: 26 meq/L (ref 19–32)
CREATININE: 0.72 mg/dL (ref 0.50–1.10)
Chloride: 105 mEq/L (ref 96–112)
GFR, Est African American: >89 mL/min
GFR, Est Non African American: 88 mL/min
Glucose, Bld: 75 mg/dL (ref 70–99)
Potassium: 4.5 mEq/L (ref 3.5–5.3)
SODIUM: 140 meq/L (ref 135–145)
TOTAL PROTEIN: 6.8 g/dL (ref 6.0–8.3)
Total Bilirubin: 0.3 mg/dL (ref 0.2–1.2)

## 2014-03-15 LAB — IFOBT (OCCULT BLOOD): IMMUNOLOGICAL FECAL OCCULT BLOOD TEST: NEGATIVE

## 2014-03-15 LAB — POCT URINALYSIS DIPSTICK
Bilirubin, UA: NEGATIVE
Blood, UA: NEGATIVE
GLUCOSE UA: NEGATIVE
KETONES UA: NEGATIVE
NITRITE UA: NEGATIVE
Protein, UA: NEGATIVE
Spec Grav, UA: 1.015
UROBILINOGEN UA: 0.2
pH, UA: 7

## 2014-03-15 LAB — TSH: TSH: 2.88 u[IU]/mL (ref 0.350–4.500)

## 2014-03-15 MED ORDER — ALBUTEROL SULFATE HFA 108 (90 BASE) MCG/ACT IN AERS: 2.0000 | INHALATION_SPRAY | RESPIRATORY_TRACT | Status: DC | PRN

## 2014-03-15 NOTE — Patient Instructions (Signed)
I have ordered referrals to GI and GYN for full evaluation of your symptoms. You will be scheduled to return for WELCOME to MEDICARE VISIT with EXAM in 3 months.   Pneumococcal Vaccine, Polyvalent suspension for injection What is this medicine? PNEUMOCOCCAL VACCINE, POLYVALENT (NEU mo KOK al vak SEEN, pol ee VEY luhnt) is a vaccine to prevent pneumococcus bacteria infection. These bacteria are a major cause of ear infections, 'Strep throat' infections, and serious pneumonia, meningitis, or blood infections worldwide. These vaccines help the body to produce antibodies (protective substances) that help your body defend against these bacteria. This vaccine is recommended for infants and young children. This vaccine will not treat an infection. This medicine may be used for other purposes; ask your health care provider or pharmacist if you have questions. COMMON BRAND NAME(S): Prevnar 13 What should I tell my health care provider before I take this medicine? They need to know if you have any of these conditions: -bleeding problems -fever -immune system problems -low platelet count in the blood -seizures -an unusual or allergic reaction to pneumococcal vaccine, diphtheria toxoid, other vaccines, latex, other medicines, foods, dyes, or preservatives -pregnant or trying to get pregnant -breast-feeding How should I use this medicine? This vaccine is for injection into a muscle. It is given by a health care professional. A copy of Vaccine Information Statements will be given before each vaccination. Read this sheet carefully each time. The sheet may change frequently. Talk to your pediatrician regarding the use of this medicine in children. While this drug may be prescribed for children as young as 63 weeks old for selected conditions, precautions do apply. Overdosage: If you think you have taken too much of this medicine contact a poison control center or emergency room at once. NOTE: This medicine is  only for you. Do not share this medicine with others. What if I miss a dose? It is important not to miss your dose. Call your doctor or health care professional if you are unable to keep an appointment. What may interact with this medicine? -medicines for cancer chemotherapy -medicines that suppress your immune function -medicines that treat or prevent blood clots like warfarin, enoxaparin, and dalteparin -steroid medicines like prednisone or cortisone This list may not describe all possible interactions. Give your health care provider a list of all the medicines, herbs, non-prescription drugs, or dietary supplements you use. Also tell them if you smoke, drink alcohol, or use illegal drugs. Some items may interact with your medicine. What should I watch for while using this medicine? Mild fever and pain should go away in 3 days or less. Report any unusual symptoms to your doctor or health care professional. What side effects may I notice from receiving this medicine? Side effects that you should report to your doctor or health care professional as soon as possible: -allergic reactions like skin rash, itching or hives, swelling of the face, lips, or tongue -breathing problems -confused -fever over 102 degrees F -pain, tingling, numbness in the hands or feet -seizures -unusual bleeding or bruising -unusual muscle weakness Side effects that usually do not require medical attention (report to your doctor or health care professional if they continue or are bothersome): -aches and pains -diarrhea -fever of 102 degrees F or less -headache -irritable -loss of appetite -pain, tender at site where injected -trouble sleeping This list may not describe all possible side effects. Call your doctor for medical advice about side effects. You may report side effects to FDA at 1-800-FDA-1088. Where  should I keep my medicine? This does not apply. This vaccine is given in a clinic, pharmacy, doctor's  office, or other health care setting and will not be stored at home. NOTE: This sheet is a summary. It may not cover all possible information. If you have questions about this medicine, talk to your doctor, pharmacist, or health care provider.  2015, Elsevier/Gold Standard. (2008-03-19 10:17:22)

## 2014-03-16 LAB — CBC WITH DIFFERENTIAL/PLATELET
BASOS ABS: 0 10*3/uL (ref 0.0–0.1)
Basophils Relative: 0 % (ref 0–1)
EOS ABS: 0.3 10*3/uL (ref 0.0–0.7)
Eosinophils Relative: 5 % (ref 0–5)
HCT: 45.4 % (ref 36.0–46.0)
Hemoglobin: 15.4 g/dL — ABNORMAL HIGH (ref 12.0–15.0)
LYMPHS PCT: 33 % (ref 12–46)
Lymphs Abs: 2 10*3/uL (ref 0.7–4.0)
MCH: 31 pg (ref 26.0–34.0)
MCHC: 33.9 g/dL (ref 30.0–36.0)
MCV: 91.3 fL (ref 78.0–100.0)
MONO ABS: 0.7 10*3/uL (ref 0.1–1.0)
MPV: 11.2 fL (ref 8.6–12.4)
Monocytes Relative: 12 % (ref 3–12)
Neutro Abs: 3.1 10*3/uL (ref 1.7–7.7)
Neutrophils Relative %: 50 % (ref 43–77)
Platelets: 245 10*3/uL (ref 150–400)
RBC: 4.97 MIL/uL (ref 3.87–5.11)
RDW: 13.4 % (ref 11.5–15.5)
WBC: 6.2 10*3/uL (ref 4.0–10.5)

## 2014-03-16 LAB — HEPATITIS C ANTIBODY: HCV AB: NEGATIVE

## 2014-03-17 ENCOUNTER — Encounter: Payer: Self-pay | Admitting: Family Medicine
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
## 2014-03-18 ENCOUNTER — Encounter: Payer: Self-pay | Admitting: Physician Assistant

## 2014-03-21 ENCOUNTER — Encounter: Payer: Self-pay | Admitting: Internal Medicine

## 2014-03-26 ENCOUNTER — Other Ambulatory Visit (HOSPITAL_COMMUNITY)
Admission: RE | Admit: 2014-03-26 | Discharge: 2014-03-26 | Disposition: A | Discharge status: Home / Self Care | Payer: Medicare Other | Source: Ambulatory Visit | Attending: Gynecology | Admitting: Gynecology

## 2014-03-26 ENCOUNTER — Ambulatory Visit (INDEPENDENT_AMBULATORY_CARE_PROVIDER_SITE_OTHER): Payer: Medicare Other | Admitting: Gynecology

## 2014-03-26 ENCOUNTER — Encounter: Payer: Self-pay | Admitting: Gynecology

## 2014-03-26 VITALS — BP 124/78 | Ht 62.0 in | Wt 163.0 lb

## 2014-03-26 DIAGNOSIS — R140 Abdominal distension (gaseous): Secondary | ICD-10-CM | POA: Diagnosis not present

## 2014-03-26 DIAGNOSIS — R87810 Cervical high risk human papillomavirus (HPV) DNA test positive: Secondary | ICD-10-CM | POA: Insufficient documentation

## 2014-03-26 DIAGNOSIS — Z1272 Encounter for screening for malignant neoplasm of vagina: Secondary | ICD-10-CM

## 2014-03-26 DIAGNOSIS — Z1151 Encounter for screening for human papillomavirus (HPV): Secondary | ICD-10-CM | POA: Diagnosis present

## 2014-03-26 DIAGNOSIS — R87619 Unspecified abnormal cytological findings in specimens from cervix uteri: Secondary | ICD-10-CM | POA: Insufficient documentation

## 2014-03-26 DIAGNOSIS — N952 Postmenopausal atrophic vaginitis: Secondary | ICD-10-CM

## 2014-03-26 DIAGNOSIS — Z780 Asymptomatic menopausal state: Secondary | ICD-10-CM | POA: Diagnosis not present

## 2014-03-26 DIAGNOSIS — N3281 Overactive bladder: Secondary | ICD-10-CM

## 2014-03-26 DIAGNOSIS — N3941 Urge incontinence: Secondary | ICD-10-CM

## 2014-03-26 DIAGNOSIS — Z01419 Encounter for gynecological examination (general) (routine) without abnormal findings: Secondary | ICD-10-CM | POA: Diagnosis not present

## 2014-03-26 DIAGNOSIS — Z124 Encounter for screening for malignant neoplasm of cervix: Secondary | ICD-10-CM | POA: Insufficient documentation

## 2014-03-26 LAB — LIPID PANEL
CHOL/HDL RATIO: 3.7 ratio
CHOLESTEROL: 164 mg/dL (ref 0–200)
HDL: 44 mg/dL — AB (ref >=46)
LDL CALC: 100 mg/dL — AB (ref 0–99)
TRIGLYCERIDES: 102 mg/dL (ref <150)
VLDL: 20 mg/dL (ref 0–40)

## 2014-03-26 MED ORDER — NONFORMULARY OR COMPOUNDED ITEM: Status: DC

## 2014-03-26 MED ORDER — MIRABEGRON ER 25 MG PO TB24: 25.0000 mg | ORAL_TABLET | Freq: Every day | ORAL | Status: DC

## 2014-03-26 NOTE — Patient Instructions (Addendum)
Bone Densitometry Bone densitometry is a special X-ray that measures your bone density and can be used to help predict your risk of bone fractures. This test is used to determine bone mineral content and density to diagnose osteoporosis. Osteoporosis is the loss of bone that may cause the bone to become weak. Osteoporosis commonly occurs in women entering menopause. However, it may be found in men and in people with other diseases. PREPARATION FOR TEST No preparation necessary. WHO SHOULD BE TESTED?  All women older than 80.  Postmenopausal women (50 to 75) with risk factors for osteoporosis.  People with a previous fracture caused by normal activities.  People with a small body frame (less than 127 poundsor a body mass index [BMI] of less than 21).  People who have a parent with a hip fracture or history of osteoporosis.  People who smoke.  People who have rheumatoid arthritis.  Anyone who engages in excessive alcohol use (more than 3 drinks most days).  Women who experience early menopause. WHEN SHOULD YOU BE RETESTED? Current guidelines suggest that you should wait at least 2 years before doing a bone density test again if your first test was normal.Recent studies indicated that women with normal bone density may be able to wait a few years before needing to repeat a bone density test. You should discuss this with your caregiver.  NORMAL FINDINGS   Normal: less than standard deviation below normal (greater than -1).  Osteopenia: 1 to 2.5 standard deviations below normal (-1 to -2.5).  Osteoporosis: greater than 2.5 standard deviations below normal (less than -2.5). Test results are reported as a "T score" and a "Z score."The T score is a number that compares your bone density with the bone density of healthy, young women.The Z score is a number that compares your bone density with the scores of women who are the same age, gender, and race.  Ranges for normal findings may vary  among different laboratories and hospitals. You should always check with your doctor after having lab work or other tests done to discuss the meaning of your test results and whether your values are considered within normal limits. MEANING OF TEST  Your caregiver will go over the test results with you and discuss the importance and meaning of your results, as well as treatment options and the need for additional tests if necessary. OBTAINING THE TEST RESULTS It is your responsibility to obtain your test results. Ask the lab or department performing the test when and how you will get your results. Document Released: 01/27/2004 Document Revised: 03/29/2011 Document Reviewed: 02/18/2010 [facility] Patient Information 2015 [place], [place]. This information is not intended to replace advice given to you by your health care provider. Make sure you discuss any questions you have with your health care provider. Mirabegron extended-release tablets What is this medicine? MIRABEGRON (MIR a BEG ron) is used to treat overactive bladder. This medicine reduces the amount of bathroom visits. It may also help to control wetting accidents. This medicine may be used for other purposes; ask your health care provider or pharmacist if you have questions. COMMON BRAND NAME(S): Myrbetriq What should I tell my health care provider before I take this medicine? They need to know if you have any of these conditions: -difficulty passing urine -high blood pressure -kidney disease -liver disease -an unusual or allergic reaction to mirabegron, other medicines, foods, dyes, or preservatives -pregnant or trying to get pregnant -breast-feeding How should I use this medicine? Take this medicine  by mouth with a glass of water. Follow the directions on the prescription label. Do not cut, crush or chew this medicine. You can take it with or without food. If it upsets your stomach, take it with food. Take your medicine at regular  intervals. Do not take it more often than directed. Do not stop taking except on your doctor's advice. Talk to your pediatrician regarding the use of this medicine in children. Special care may be needed. Overdosage: If you think you've taken too much of this medicine contact a poison control center or emergency room at once. Overdosage: If you think you have taken too much of this medicine contact a poison control center or emergency room at once. NOTE: This medicine is only for you. Do not share this medicine with others. What if I miss a dose? If you miss a dose, take it as soon as you can. If it is almost time for your next dose, take only that dose. Do not take double or extra doses. What may interact with this medicine? -certain medicines for bladder problems like fesoterodine, oxybutynin, solifenacin, tolterodine -desipramine -digoxin -flecainide -ketoconazole -MAOIs like Carbex, Eldepryl, Marplan, Nardil, and Parnate -metoprolol -propafenone -thioridazine -warfarin This list may not describe all possible interactions. Give your health care provider a list of all the medicines, herbs, non-prescription drugs, or dietary supplements you use. Also tell them if you smoke, drink alcohol, or use illegal drugs. Some items may interact with your medicine. What should I watch for while using this medicine? It may take 8 weeks to notice the full benefit from this medicine. You may need to limit your intake tea, coffee, caffeinated sodas, and alcohol. These drinks may make your symptoms worse. Visit your doctor or health care professional for regular checks on your progress. Check your blood pressure as directed. Ask your doctor or health care professional what your blood pressure should be and when you should contact him or her. What side effects may I notice from receiving this medicine? Side effects that you should report to your doctor or health care professional as soon as  possible: -allergic reactions like skin rash, itching or hives, swelling of the face, lips, or tongue -chest pain or palpitations -severe or sudden headache -high blood pressure -fast, irregular heartbeat -redness, blistering, peeling or loosening of the skin, including inside the mouth -signs of infection - fever or chills, pain or difficulty passing urine -trouble passing urine or change in the amount of urine Side effects that usually do not require medical attention (Report these to your doctor or health care professional if they continue or are bothersome.): -constipation -dry eyes -joint pain -mild headache -nausea -runny nose This list may not describe all possible side effects. Call your doctor for medical advice about side effects. You may report side effects to FDA at 1-800-FDA-1088. Where should I keep my medicine? Keep out of the reach of children. Store at room temperature between 15 and 30 degrees C (59 and 86 degrees F). Throw away any unused medicine after the expiration date. NOTE: This sheet is a summary. It may not cover all possible information. If you have questions about this medicine, talk to your doctor, pharmacist, or health care provider.  2015, [employer]vier/[employer] Standard. (2011-09-17 15:59:47) Hormone Therapy At menopause, your body begins making less estrogen and progesterone hormones. This causes the body to stop having menstrual periods. This is because estrogen and progesterone hormones control your periods and menstrual cycle. A lack of estrogen may cause  symptoms such as:  Hot flushes (or hot flashes).  Vaginal dryness.  Dry skin.  Loss of sex drive.  Risk of bone loss (osteoporosis). When this happens, you may choose to take hormone therapy to get back the estrogen lost during menopause. When the hormone estrogen is given alone, it is usually referred to as ET (Estrogen Therapy). When the hormone progestin is combined with estrogen, it is generally  called HT (Hormone Therapy). This was formerly known as hormone replacement therapy (HRT). Your caregiver can help you make a decision on what will be best for you. The decision to use HT seems to change often as new studies are done. Many studies do not agree on the benefits of hormone replacement therapy. LIKELY BENEFITS OF HT INCLUDE PROTECTION FROM:  Hot Flushes (also called hot flashes) - A hot flush is a sudden feeling of heat that spreads over the face and body. The skin may redden like a blush. It is connected with sweats and sleep disturbance. Women going through menopause may have hot flushes a few times a month or several times per day depending on the woman.  Osteoporosis (bone loss)- Estrogen helps guard against bone loss. After menopause, a woman's bones slowly lose calcium and become weak and brittle. As a result, bones are more likely to break. The hip, wrist, and spine are affected most often. Hormone therapy can help slow bone loss after menopause. Weight bearing exercise and taking calcium with vitamin D also can help prevent bone loss. There are also medications that your caregiver can prescribe that can help prevent osteoporosis.  Vaginal Dryness - Loss of estrogen causes changes in the vagina. Its lining may become thin and dry. These changes can cause pain and bleeding during sexual intercourse. Dryness can also lead to infections. This can cause burning and itching. (Vaginal estrogen treatment can help relieve pain, itching, and dryness.)  Urinary Tract Infections are more common after menopause because of lack of estrogen. Some women also develop urinary incontinence because of low estrogen levels in the vagina and bladder.  Possible other benefits of estrogen include a positive effect on mood and short-term memory in women. RISKS AND COMPLICATIONS  Using estrogen alone without progesterone causes the lining of the uterus to grow. This increases the risk of lining of the uterus  (endometrial) cancer. Your caregiver should give another hormone called progestin if you have a uterus.  Women who take combined (estrogen and progestin) HT appear to have an increased risk of breast cancer. The risk appears to be small, but increases throughout the time that HT is taken.  Combined therapy also makes the breast tissue slightly denser which makes it harder to read mammograms (breast X-rays).  Combined, estrogen and progesterone therapy can be taken together every day, in which case there may be spotting of blood. HT therapy can be taken cyclically in which case you will have menstrual periods. Cyclically means HT is taken for a set amount of days, then not taken, then this process is repeated.  HT may increase the risk of stroke, heart attack, breast cancer and forming blood clots in your leg.  Transdermal estrogen (estrogen that is absorbed through the skin with a patch or a cream) may have more positive results with:  Cholesterol.  Blood pressure.  Blood clots. Having the following conditions may indicate you should not have HT:  Endometrial cancer.  Liver disease.  Breast cancer.  Heart disease.  History of blood clots.  Stroke. TREATMENT  If you choose to take HT and have a uterus, usually estrogen and progestin are prescribed.  Your caregiver will help you decide the best way to take the medications.  Possible ways to take estrogen include:  Pills.  Patches.  Gels.  Sprays.  Vaginal estrogen cream, rings and tablets.  It is best to take the lowest dose possible that will help your symptoms and take them for the shortest period of time that you can.  Hormone therapy can help relieve some of the problems (symptoms) that affect women at menopause. Before making a decision about HT, talk to your caregiver about what is best for you. Be well informed and comfortable with your decisions. HOME CARE INSTRUCTIONS   Follow your caregivers advice when  taking the medications.  A Pap test is done to screen for cervical cancer.  The first Pap test should be done at age 32.  Between ages 65 and 5, Pap tests are repeated every 2 years.  Beginning at age 37, you are advised to have a Pap test every 3 years as long as your past 3 Pap tests have been normal.  Some women have medical problems that increase the chance of getting cervical cancer. Talk to your caregiver about these problems. It is especially important to talk to your caregiver if a new problem develops soon after your last Pap test. In these cases, your caregiver may recommend more frequent screening and Pap tests.  The above recommendations are the same for women who have or have not gotten the vaccine for HPV (Human Papillomavirus).  If you had a hysterectomy for a problem that was not a cancer or a condition that could lead to cancer, then you no longer need Pap tests. However, even if you no longer need a Pap test, a regular exam is a good idea to make sure no other problems are starting.   If you are between ages 10 and 88, and you have had normal Pap tests going back 10 years, you no longer need Pap tests. However, even if you no longer need a Pap test, a regular exam is a good idea to make sure no other problems are starting.   If you have had past treatment for cervical cancer or a condition that could lead to cancer, you need Pap tests and screening for cancer for at least 20 years after your treatment.  If Pap tests have been discontinued, risk factors (such as a new sexual partner) need to be re-assessed to determine if screening should be resumed.  Some women may need screenings more often if they are at high risk for cervical cancer.  Get mammograms done as per the advice of your caregiver. SEEK IMMEDIATE MEDICAL CARE IF:  You develop abnormal vaginal bleeding.  You have pain or swelling in your legs, shortness of breath, or chest pain.  You develop  dizziness or headaches.  You have lumps or changes in your breasts or armpits.  You have slurred speech.  You develop weakness or numbness of your arms or legs.  You have pain, burning, or bleeding when urinating.  You develop abdominal pain. Document Released: 10/03/2002 Document Revised: 03/29/2011 Document Reviewed: 01/21/2010 [facility] Patient Information 2015 [place], [place]. This information is not intended to replace advice given to you by your health care provider. Make sure you discuss any questions you have with your health care provider.

## 2014-03-26 NOTE — Progress Notes (Signed)
Pamela Daniel 11-19-1950 [id number]   History:    64 y.o.  for annual gyn exam who is a new patient to the practice. Patient with several complaints:  #1 patient is complaining of urgency and frequency as well as incontinence. She also states that she wakes up 2-3 times a night to urinate. #2 patient has had complaints of dyspareunia for several months now #3 patient complaining of low abdominal pressure sensation has history of bloating and also history of hemorrhoids and rectal bleeding. Patient scheduled to see gastroenterologist this week. She does state she had a normal colonoscopy in 2009. #4 patient with past history of transvaginal hysterectomy with bilateral salpingo-oophorectomy. Patient stated that she was on hormone replacement therapy for a few years for proximally 4 years but is no longer on any hormone replacement therapy.  Patient does not recall ever having had a bone density study and her mammogram is long overdue as well.  Past medical history,surgical history, family history and social history were all reviewed and documented in the EPIC chart.  Gynecologic History No LMP recorded. Patient has had a hysterectomy. Contraception: status post hysterectomy Last Pap: Many years ago. Results were: normal Last mammogram: Many years ago. Results were: normal  Obstetric History OB History  Gravida Para Term Preterm AB SAB TAB Ectopic Multiple Living  4 4  1      3     # Outcome Date GA Lbr Len/2nd Weight Sex Delivery Anes PTL Lv  4 Para           3 Para           2 Para           1 Preterm                ROS: A ROS was performed and pertinent positives and negatives are included in the history.  GENERAL: No fevers or chills. HEENT: No change in vision, no earache, sore throat or sinus congestion. NECK: No pain or stiffness. CARDIOVASCULAR: No chest pain or pressure. No palpitations. PULMONARY: No shortness of breath, cough or wheeze. GASTROINTESTINAL: No abdominal  pain, nausea, vomiting or diarrhea, melena or bright red blood per rectum. GENITOURINARY: No urinary frequency, urgency, hesitancy or dysuria. MUSCULOSKELETAL: No joint or muscle pain, no back pain, no recent trauma. DERMATOLOGIC: No rash, no itching, no lesions. ENDOCRINE: No polyuria, polydipsia, no heat or cold intolerance. No recent change in weight. HEMATOLOGICAL: No anemia or easy bruising or bleeding. NEUROLOGIC: No headache, seizures, numbness, tingling or weakness. PSYCHIATRIC: No depression, no loss of interest in normal activity or change in sleep pattern.     Exam: chaperone present  BP 124/78 mmHg  Ht 5\' 2"  (1.575 m)  Wt 163 lb (73.936 kg)  BMI 29.81 kg/m2  Body mass index is 29.81 kg/(m^2).  General appearance : Well developed well nourished female. No acute distress HEENT: Eyes: no retinal hemorrhage or exudates,  Neck supple, trachea midline, no carotid bruits, no thyroidmegaly Lungs: Clear to auscultation, no rhonchi or wheezes, or rib retractions  Heart: Regular rate and rhythm, no murmurs or gallops Breast:Examined in sitting and supine position were symmetrical in appearance, no palpable masses or tenderness,  no skin retraction, no nipple inversion, no nipple discharge, no skin discoloration, no axillary or supraclavicular lymphadenopathy Abdomen: no palpable masses or tenderness, no rebound or guarding Extremities: no edema or skin discoloration or tenderness  Pelvic:  Bartholin, Urethra, Skene Glands: Within normal limits  Vagina: Raw  vaginal cuff  Cervix: Absent  Uterus absent  Adnexa  Without masses or tenderness  Anus and perineum  normal   Rectovaginal  normal sphincter tone without palpated masses or tenderness             Hemoccult has follow up with gastroenterologist this week   The following tests were done in the office to assess her complaint of urinary incontinence: In the supine position during Valsalva she did not leak any urine and  there was no bulge noted in her vagina. A sterile Q-tip with lidocaine gel applied was placed in the urethrovesical angle and she was asked to bear down and cough and there was less than a 30 angle change. She was then asked to stand up cough and bear down and she did not leak urine.  Assessment/Plan:  64 y.o. female for annual exam who is menopausal and is suffering from severe vaginal atrophy. Patient went to be place on vaginal estrogen twice a week to improve her vaginal atrophy and dyspareunia. I have asked her to return back to the office in 4 months for follow-up to look at the raw area in the back of her vaginal cuff on lesser Pap smear that was done today demonstrates any sign of dysplasia. Patient was reminded to schedule mammogram. The gastrologist will determine if she is another colonoscopy at this time or not and or possible endoscopy because of patient's bloating sensation. As to the patient's apparent overactive bladder she is going to be placed on Myrbetriq 25 mg 1 by mouth daily. When she returns back in 4 months we will see how she is responding to the medication. The risks benefits and pros and cons were discussed. Literature information was provided. PCP recently did all her lab work with the exception of a fasting lipid profile for which will be drawn today for screening as well as her Pap smear. She was reminded on the importance of calcium vitamin D and regular exercise for osteoporosis prevention. A bone density study will be scheduled   Terrance Mass MD, 10:07 AM 03/26/2014

## 2014-03-27 ENCOUNTER — Encounter: Payer: Self-pay | Admitting: Physician Assistant

## 2014-03-27 ENCOUNTER — Ambulatory Visit (INDEPENDENT_AMBULATORY_CARE_PROVIDER_SITE_OTHER): Payer: Medicare Other | Admitting: Physician Assistant

## 2014-03-27 VITALS — BP 118/70 | HR 69 | Ht 61.0 in | Wt 164.0 lb

## 2014-03-27 DIAGNOSIS — K648 Other hemorrhoids: Secondary | ICD-10-CM

## 2014-03-27 DIAGNOSIS — R194 Change in bowel habit: Secondary | ICD-10-CM

## 2014-03-27 DIAGNOSIS — K625 Hemorrhage of anus and rectum: Secondary | ICD-10-CM

## 2014-03-27 DIAGNOSIS — Z800 Family history of malignant neoplasm of digestive organs: Secondary | ICD-10-CM

## 2014-03-27 DIAGNOSIS — R1032 Left lower quadrant pain: Secondary | ICD-10-CM

## 2014-03-27 DIAGNOSIS — K644 Residual hemorrhoidal skin tags: Secondary | ICD-10-CM

## 2014-03-27 LAB — CYTOLOGY - PAP

## 2014-03-27 MED ORDER — NA SULFATE-K SULFATE-MG SULF 17.5-3.13-1.6 GM/177ML PO SOLN: 1.0000 | Freq: Once | ORAL | Status: DC

## 2014-03-27 NOTE — Patient Instructions (Signed)
You have been scheduled for a colonoscopy. Please follow written instructions given to you at your visit today.  We have given you a Suprep for the colonoscopy. If you use inhalers (even only as needed), please bring them with you on the day of your procedure. Your physician has requested that you go to www.startemmi.com and enter the access code given to you at your visit today. This web site gives a general overview about your procedure. However, you should still follow specific instructions given to you by our office regarding your preparation for the procedure.  Get Recticare at the pharmacy or [facility]. Apply 3-4 times daily,.

## 2014-03-27 NOTE — Progress Notes (Signed)
Patient ID: Pamela Daniel, female   DOB: 1950/06/15, 64 y.o.   MRN: [id number]   Subjective:    Patient ID: Pamela Daniel, female    DOB: 19-Nov-1950, 64 y.o.   MRN: [id number]  HPI Pamela Daniel is a pleasant 64 year old white female referred today by Dr. Leward Quan for evaluation of abdominal pain and rectal bleeding. Patient had been known in the past to Dr. Sharlett Iles and was last seen here in 2009 when she had a colonoscopy which was negative with the exception of internal hemorrhoids. She had an upper endoscopy in 2007 with finding of a distal esophageal stricture which was [facility] dilated. Patient has history of hypertension, TIA in 2014 and history of SVT. Patient relates that she has been having difficulty evacuating her bowels over the past several months and feels that she never completely empties her rectum. She has been admitted late using enemas. She has a sensation of rectal pressure or fullness most of the time. She has been having episodes of rectal pain at night over the past few months to the point that she has had to get up and soaking atop. He has also noted some seepage of stool from her rectum which is new and is now wearing a pad. She complains of lower abdominal discomfort and bloating over the past couple of months appetite has been fair rate is down 7-8 pounds. She has seen occasional bright red blood with her bowel movements and nausea also noted when she has seepage from her rectum that seems to be tinged with blood. She has been using Preparation H cream for her hemorrhoidal symptoms. Patient has strong family history of colon cancer in her brother who died at 67 from colon cancer in her mother who was diagnosed in her 78s. Patient has had recent labs worked were reviewed and unremarkable.  Review of Systems Pertinent positive and negative review of systems were noted in the above HPI section.  All other review of systems was otherwise negative.  Outpatient Encounter  Prescriptions as of 03/27/2014  Medication Sig  . albuterol (PROVENTIL HFA;VENTOLIN HFA) 108 (90 BASE) MCG/ACT inhaler Inhale 2 puffs into the lungs every 4 (four) hours as needed. Shortness of breath/wheezing  . aspirin 81 MG chewable tablet Chew 1 tablet (81 mg total) by mouth daily.  Marland Kitchen ibuprofen (ADVIL,MOTRIN) 200 MG tablet Take 200 mg by mouth every 6 (six) hours as needed for moderate pain.  . NONFORMULARY OR COMPOUNDED ITEM Estradiol 0.02 % 41m prefilled applicator Sig: apply twice a week  . mirabegron ER (MYRBETRIQ) 25 MG TB24 tablet Take 1 tablet (25 mg total) by mouth daily. (Patient not taking: Reported on 03/27/2014)  . Na Sulfate-K Sulfate-Mg Sulf SOLN Take 1 kit by mouth once.   No Known Allergies Patient Active Problem List   Diagnosis Date Noted  . TIA (transient ischemic attack) 12/22/2011  . SVT/ PSVT/ PAT 01/13/2010  . RECTAL BLEEDING 08/01/2007  . DIARRHEA 08/01/2007  . ESOPHAGEAL STRICTURE 07/31/2007  . HIATAL HERNIA 07/31/2007  . COLONIC POLYPS, HX OF 07/31/2007  . HYPERTENSION 03/14/2007  . G E R D 03/14/2007   History   Social History  . Marital Status: Divorced    Spouse Name: N/A  . Number of Children: N/A  . Years of Education: N/A   Occupational History  . Not on file.   Social History Main Topics  . Smoking status: Never Smoker   . Smokeless tobacco: Never Used  . Alcohol Use: 0.0 oz/week  0 Standard drinks or equivalent per week     Comment: RARE  . Drug Use: No  . Sexual Activity: Yes     Comment: 1st intercourse 64 yo-More than 5 partners   Other Topics Concern  . Not on file   Social History Narrative   Retired - previously employed at [employer]    [name] family history includes Breast cancer (age of onset: 56) in her mother; COPD in her father; Cancer in her brother, brother, and mother; Cancer (age of onset: 52) in her brother; Dementia in her mother; Heart disease in her brother; Heart disease (age of onset: 60) in  her mother; Heart disease (age of onset: 70) in her father; Hypertension in her mother; Mental illness in her brother.      Objective:    Filed Vitals:   03/27/14 0923  BP: 118/70  Pulse: 69    Physical Exam well-developed older white female in no acute distress, pleasant blood pressure 118/70 pulse 69 height 5 foot 1 weight 164. HEENT ;nontraumatic normocephalic EOMI PERRLA sclera anicteric, Supple; no JVD, Cardiovascular; regular rate and rhythm with S1-S2 no murmur or gallop, Pulmonary; clear bilaterally, Abdomen ;soft ,nondistended bowel sounds are active there is no palpable mass or hepatosplenomegaly she is tender in the left mid left lower quadrant there is no guarding or rebound, Rectal; exam she has one large hemorrhoid that appears to be external which is inflamed but not thrombosed tender to exam no other lesion palpable scant stool is heme positive, Extremities; no clubbing cyanosis or edema skin warm and dry, Psych ;mood and affect appropriate       Assessment & Plan:   #1 64 yo female with strong family hx of colon cancer in 2 first degree relatives #2 intermittent rectal bleeding, rectal pressure, lower abdominal  discomfort ,blaoting and change in bowel habits with difficulty evacuating bowel for 3-4 months #3 weight loss #4 GERD #5 HTN #6 hx PSVT  Plan;Pt will be scheduled for colonoscopy with Dr. Deatra Ina. Procedure discussed in detail with pt and she is agreeable to proceed She will continue Prep H cream and add recticare/lidocaine  5% 3-4 x daily as needed Add Benefiber daily May need surgical referral for management of large ext hemorrhoids if no other findings at colonoscopy.   Cristofer Yaffe S Theoplis Garciagarcia PA-C 03/27/2014   Cc: Barton Fanny, MD

## 2014-03-28 ENCOUNTER — Telehealth: Payer: Self-pay

## 2014-03-28 ENCOUNTER — Encounter: Payer: Self-pay | Admitting: Gynecology

## 2014-03-28 NOTE — Telephone Encounter (Signed)
Please inform patient that with her history of ventricular tachycardia that I would recommend that she see the urologist to see alternative treatment that may be nonmedical to help with her overactive bladder and urgency incontinence. Please give her the telephone number to [facility].

## 2014-03-28 NOTE — Telephone Encounter (Signed)
Patient contacted me by e-mail through My Chart and I forwarded this reply to her there.

## 2014-03-28 NOTE — Progress Notes (Signed)
Reviewed and agree with management. Robert D. Kaplan, M.D., FACG  

## 2014-03-28 NOTE — Telephone Encounter (Signed)
Note sent from pharmacy regarding Myrbetriq 25 mg tablet.  "Change to something else at patient's request if possible".

## 2014-03-28 NOTE — Telephone Encounter (Signed)
Left message for patient to call me

## 2014-04-04 ENCOUNTER — Ambulatory Visit (INDEPENDENT_AMBULATORY_CARE_PROVIDER_SITE_OTHER): Payer: Medicare Other | Admitting: Family Medicine

## 2014-04-04 ENCOUNTER — Ambulatory Visit (INDEPENDENT_AMBULATORY_CARE_PROVIDER_SITE_OTHER): Payer: Medicare Other

## 2014-04-04 ENCOUNTER — Other Ambulatory Visit: Payer: Self-pay | Admitting: Gynecology

## 2014-04-04 ENCOUNTER — Encounter: Payer: Self-pay | Admitting: Family Medicine

## 2014-04-04 VITALS — BP 134/82 | HR 73 | Temp 98.0°F | Resp 16 | Ht 60.5 in | Wt 162.0 lb

## 2014-04-04 DIAGNOSIS — Z23 Encounter for immunization: Secondary | ICD-10-CM

## 2014-04-04 DIAGNOSIS — I471 Supraventricular tachycardia: Secondary | ICD-10-CM

## 2014-04-04 DIAGNOSIS — Z0000 Encounter for general adult medical examination without abnormal findings: Secondary | ICD-10-CM | POA: Diagnosis not present

## 2014-04-04 DIAGNOSIS — K648 Other hemorrhoids: Secondary | ICD-10-CM | POA: Diagnosis not present

## 2014-04-04 DIAGNOSIS — Z780 Asymptomatic menopausal state: Secondary | ICD-10-CM | POA: Diagnosis not present

## 2014-04-04 DIAGNOSIS — Z1231 Encounter for screening mammogram for malignant neoplasm of breast: Secondary | ICD-10-CM | POA: Diagnosis not present

## 2014-04-04 DIAGNOSIS — M8580 Other specified disorders of bone density and structure, unspecified site: Secondary | ICD-10-CM

## 2014-04-04 DIAGNOSIS — Z13820 Encounter for screening for osteoporosis: Secondary | ICD-10-CM

## 2014-04-04 DIAGNOSIS — G4700 Insomnia, unspecified: Secondary | ICD-10-CM

## 2014-04-04 DIAGNOSIS — H547 Unspecified visual loss: Secondary | ICD-10-CM

## 2014-04-04 DIAGNOSIS — K644 Residual hemorrhoidal skin tags: Secondary | ICD-10-CM

## 2014-04-04 MED ORDER — ALPRAZOLAM 0.25 MG PO TABS: ORAL_TABLET | ORAL | Status: DC

## 2014-04-04 MED ORDER — NA SULFATE-K SULFATE-MG SULF 17.5-3.13-1.6 GM/177ML PO SOLN: 1.0000 | Freq: Once | ORAL | Status: AC

## 2014-04-04 NOTE — Progress Notes (Signed)
Subjective:    Patient ID: Pamela Daniel, female    DOB: 18-Jun-1950, 64 y.o.   MRN: [id number]  HPI This 64 y.o. Female is here for [facility] exam/visit. She had GYN care w/ Dr. Toney Rakes and reports "some abnormal cells" on her PAP (S/P TAH).  HCM: MMG- Overdue; needs to be scheduled.           CRS- Aug 2009 w/ Dr. Sharlett Iles; normal w/ 5-year recall. Pt is scheduled.            IMM- Needs Zostavax but too costly. To receive Prevnar13 today.            Vision- Current.            Dental- Current.  Patient Active Problem List   Diagnosis Date Noted  . TIA (transient ischemic attack) 12/22/2011  . SVT/ PSVT/ PAT 01/13/2010  . RECTAL BLEEDING 08/01/2007  . DIARRHEA 08/01/2007  . ESOPHAGEAL STRICTURE 07/31/2007  . HIATAL HERNIA 07/31/2007  . COLONIC POLYPS, HX OF 07/31/2007  . HYPERTENSION 03/14/2007  . G E R D 03/14/2007    Prior to Admission medications   Medication Sig Start Date End Date Taking? Authorizing Provider  albuterol (PROVENTIL HFA;VENTOLIN HFA) 108 (90 BASE) MCG/ACT inhaler Inhale 2 puffs into the lungs every 4 (four) hours as needed. Shortness of breath/wheezing 03/15/14  Yes Barton Fanny, MD  aspirin 81 MG chewable tablet Chew 1 tablet (81 mg total) by mouth daily. 12/22/11  Yes Bryan R Hess, DO  ibuprofen (ADVIL,MOTRIN) 200 MG tablet Take 200 mg by mouth every 6 (six) hours as needed for moderate pain.   Yes Historical Provider, MD  NONFORMULARY OR COMPOUNDED ITEM Estradiol 0.02 % 38m prefilled applicator Sig: apply twice a week 03/26/14  Yes JTerrance Mass MD  ALPRAZolam (Duanne Moron 0.25 MG tablet Tale 1/2 to 1 tablet at bedtime as needed for sleep.    BBarton Fanny MD  mirabegron ER (MYRBETRIQ) 25 MG TB24 tablet Take 1 tablet (25 mg total) by mouth daily. Patient not taking: Reported on 03/27/2014 03/26/14   JTerrance Mass MD  Na Sulfate-K Sulfate-Mg Sulf SOLN Take 1 kit by mouth once. 04/04/14 05/04/14      Past Surgical History  Procedure  Laterality Date  . Cardiac electrophysiology mapping and ablation  2011  . Nissan fundoplication  20086 . Foot neuroma surgery    . Nasal septum surgery    . Tee without cardioversion  02/15/2012    Procedure: TRANSESOPHAGEAL ECHOCARDIOGRAM (TEE);  Surgeon: JLaverda Page MD;  [facility]  Service: Cardiovascular;  Laterality: N/A;  . Abdominal hysterectomy  1996    BSO    History   Social History  . Marital Status: Divorced    Spouse Name: N/A  . Number of Children: N/A  . Years of Education: N/A   Occupational History  . Not on file.   Social History Main Topics  . Smoking status: Never Smoker   . Smokeless tobacco: Never Used  . Alcohol Use: 0.0 oz/week    0 Standard drinks or equivalent per week     Comment: RARE  . Drug Use: No  . Sexual Activity: Yes     Comment: 1st intercourse 64 yo-More than 5 partners   Other Topics Concern  . Not on file   Social History Narrative   Retired - previously employed at [employer]    Family History  Problem Relation Age of Onset  .  Heart disease Mother 83    Heart failure >> death at age 28  . Dementia Mother   . Hypertension Mother   . Cancer Mother     Breast and colon  . Breast cancer Mother 37  . Heart disease Father 67    MI  . COPD Father   . Heart disease Brother   . Mental illness Brother   . Cancer Brother 1    Lung and bone  . Cancer Brother     Lung and bone  . Cancer Brother     Colon               Review of Systems  Constitutional: Negative.   HENT: Negative.   Eyes: Positive for visual disturbance.  Respiratory: Negative.   Cardiovascular: Positive for palpitations. Negative for chest pain.  Gastrointestinal: Negative.   Endocrine: Negative.   Musculoskeletal: Positive for arthralgias. Negative for gait problem.  Skin: Negative.   Allergic/Immunologic: Negative.   Neurological: Negative.   Hematological: Negative.   Psychiatric/Behavioral: Positive for sleep  disturbance. Negative for suicidal ideas, confusion, decreased concentration and agitation. The patient is nervous/anxious.        Chronic problem with insomnia/ early awakening after 2-3 hours sleep. Will get up and watch TV or get on computer. Can sometimes get back to sleep for a few more hours.       Objective:   Physical Exam  Constitutional: She is oriented to person, place, and time. Vital signs are normal. She appears well-developed and well-nourished. No distress.  Blood pressure 134/82, pulse 73, temperature 98 F (36.7 C), temperature source Oral, resp. rate 16, height 5' 0.5" (1.537 m), weight 162 lb (73.483 kg), SpO2 98 %.   HENT:  Head: Normocephalic and atraumatic.  Right Ear: Hearing, tympanic membrane, external ear and ear canal normal.  Left Ear: Hearing, tympanic membrane, external ear and ear canal normal.  Nose: Nose normal. No nasal deformity or septal deviation.  Mouth/Throat: Uvula is midline, oropharynx is clear and moist and mucous membranes are normal. No oral lesions. Normal dentition.  Eyes: Conjunctivae, EOM and lids are normal. Pupils are equal, round, and reactive to light. No scleral icterus.  Neck: Trachea normal, normal range of motion, full passive range of motion without pain and phonation normal. Neck supple. No JVD present. No spinous process tenderness and no muscular tenderness present. Carotid bruit is not present. No thyroid mass and no thyromegaly present.  Cardiovascular: Normal rate, regular rhythm, S1 normal, S2 normal, normal heart sounds and normal pulses.   No extrasystoles are present. PMI is not displaced.  Exam reveals no gallop and no friction rub.   No murmur heard. Pulmonary/Chest: Effort normal and breath sounds normal. No respiratory distress. She has no decreased breath sounds. She has no wheezes.  Abdominal: Soft. Normal appearance, normal aorta and bowel sounds are normal. She exhibits no distension. There is no hepatosplenomegaly.  There is no tenderness. There is no guarding and no CVA tenderness.  Genitourinary:  Deferred.  Musculoskeletal:       Cervical back: Normal.       Thoracic back: She exhibits tenderness and spasm. She exhibits normal range of motion, no bony tenderness and no deformity.       Lumbar back: She exhibits tenderness and spasm. She exhibits no bony tenderness and no deformity.  Remainder of exam unremarkable.  Lymphadenopathy:       Head (right side): No submental, no submandibular, no tonsillar, no preauricular, no  posterior auricular and no occipital adenopathy present.       Head (left side): No submental, no submandibular, no tonsillar, no preauricular, no posterior auricular and no occipital adenopathy present.    She has no cervical adenopathy.       Right: No inguinal and no supraclavicular adenopathy present.       Left: No inguinal and no supraclavicular adenopathy present.  Neurological: She is alert and oriented to person, place, and time. She has normal strength and normal reflexes. She displays no atrophy. No cranial nerve deficit or sensory deficit. She exhibits normal muscle tone. She displays a negative Romberg sign. Coordination and gait normal.  Skin: Skin is warm, dry and intact. No ecchymosis, no lesion and no rash noted. She is not diaphoretic. No cyanosis or erythema. No pallor. Nails show no clubbing.  Psychiatric: She has a normal mood and affect. Her speech is normal and behavior is normal. Judgment and thought content normal. Cognition and memory are normal.  Nursing note and vitals reviewed.   ECG: Sinus rhythm w/ 1st degree AVB (borderline).     Assessment & Plan:  Medicare welcome visit - Plan: EKG 12-Lead, MM Digital Screening  Insomnia- Discussed strategies for improving sleep hygiene; advised against getting up to watch TV or work on the computer. Practice relaxation techniques. Try OTC Melatonin. Start regular fitness routine; look into Silver [employer]ne[employer] exercise  program.  Bleeding external hemorrhoids - Pt has CRS scheduled within next few weeks. Plan: Na Sulfate-K Sulfate-Mg Sulf SOLN  Need for prophylactic vaccination with combined diphtheria-tetanus-pertussis (DTP) vaccine - Plan: Td vaccine greater than or equal to 7yo preservative free IM  Need for prophylactic vaccination against Streptococcus pneumoniae (pneumococcus) - Plan: Pneumococcal conjugate vaccine 13-valent IM  Vision impairment - Plan: Ambulatory referral to Ophthalmology   Encounter for screening mammogram for breast cancer - Plan: MM Digital Screening  SVT/ PSVT/ PAT - Pt has had evaluation with Dr. Einar Gip within last 5 years; she states the ECG abnormality "is not anything new". NO symptoms at present. Plan: EKG 12-Lead   Meds ordered this encounter  Medications  . ALPRAZolam (XANAX) 0.25 MG tablet    Sig: Tale 1/2 to 1 tablet at bedtime as needed for sleep.    Dispense:  30 tablet    Refill:  0

## 2014-04-04 NOTE — Patient Instructions (Addendum)
Keeping You Healthy  Get These Tests  Blood Pressure- Have your blood pressure checked by your healthcare provider at least once a year.  Normal blood pressure is 120/80.  Weight- Have your body mass index (BMI) calculated to screen for obesity.  BMI is a measure of body fat based on height and weight.  You can calculate your own BMI at [employer]  Cholesterol- Have your cholesterol checked every year.  Diabetes- Have your blood sugar checked every year if you have high blood pressure, high cholesterol, a family history of diabetes or if you are overweight.  Pap Smear- Have a pap smear every 1 to 3 years if you have been sexually active.  If you are older than 65 and recent pap smears have been normal you may not need additional pap smears.  In addition, if you have had a hysterectomy  For benign disease additional pap smears are not necessary.  Mammogram-Yearly mammograms are essential for early detection of breast cancer  Screening for Colon Cancer- Colonoscopy starting at age 38. Screening may begin sooner depending on your family history and other health conditions.  Follow up colonoscopy as directed by your Gastroenterologist.  Screening for Osteoporosis- Screening begins at age 46 with bone density scanning, sooner if you are at higher risk for developing Osteoporosis.  Get these medicines  Calcium with Vitamin D- Your body requires 1200-1500 mg of Calcium a day and [phone] IU of Vitamin D a day.  You can only absorb 500 mg of Calcium at a time therefore Calcium must be taken in 2 or 3 separate doses throughout the day.  Hormones- Hormone therapy has been associated with increased risk for certain cancers and heart disease.  Talk to your healthcare provider about if you need relief from menopausal symptoms.  Aspirin- Ask your healthcare provider about taking Aspirin to prevent Heart Disease and Stroke.  Get these Immuniztions  Flu shot- Every fall  Pneumonia  shot- Once after the age of 52; if you are younger ask your healthcare provider if you need a pneumonia shot.  Tetanus- Every ten years.  Zostavax- Once after the age of 64 to prevent shingles.  Take these steps  Don't smoke- Your healthcare provider can help you quit. For tips on how to quit, ask your healthcare provider or go to www.smokefree.gov or call 1-800 QUIT-NOW.  Be physically active- Exercise 5 days a week for a minimum of 30 minutes.  If you are not already physically active, start slow and gradually work up to 30 minutes of moderate physical activity.  Try walking, dancing, bike riding, swimming, etc.  Eat a healthy diet- Eat a variety of healthy foods such as fruits, vegetables, whole grains, low fat milk, low fat cheeses, yogurt, lean meats, chicken, fish, eggs, dried beans, tofu, etc.  For more information go to www.thenutritionsource.org  Dental visit- Brush and floss teeth twice daily; visit your dentist twice a year.  Eye exam- Visit your Optometrist or Ophthalmologist yearly.  Drink alcohol in moderation- Limit alcohol intake to one drink or less a day.  Never drink and drive.  Depression- Your emotional health is as important as your physical health.  If you're feeling down or losing interest in things you normally enjoy, please talk to your healthcare provider.  Seat Belts- can save your life; always wear one  Smoke/Carbon Monoxide detectors- These detectors need to be installed on the appropriate level of your home.  Replace batteries at least once a year.  Violence- If anyone  is threatening or hurting you, please tell your healthcare provider.  Living Will/ Health care power of attorney- Discuss with your healthcare provider and family.        Mediterranean Diet  Why follow it? Research shows. . Those who follow the Mediterranean diet have a reduced risk of heart disease  . The diet is associated with a reduced incidence of Parkinson's and Alzheimer's  diseases . People following the diet may have longer life expectancies and lower rates of chronic diseases  . The Dietary Guidelines for Americans recommends the Mediterranean diet as an eating plan to promote health and prevent disease  What Is the Mediterranean Diet?  . Healthy eating plan based on typical foods and recipes of Mediterranean-style cooking . The diet is primarily a plant based diet; these foods should make up a majority of meals   Starches - Plant based foods should make up a majority of meals - They are an important sources of vitamins, minerals, energy, antioxidants, and fiber - Choose whole grains, foods high in fiber and minimally processed items  - Typical grain sources include wheat, oats, barley, corn, brown rice, bulgar, farro, millet, polenta, couscous  - Various types of beans include chickpeas, lentils, fava beans, black beans, white beans   Fruits  Veggies - Large quantities of antioxidant rich fruits & veggies; 6 or more servings  - Vegetables can be eaten raw or lightly drizzled with oil and cooked  - Vegetables common to the traditional Mediterranean Diet include: artichokes, arugula, beets, broccoli, brussel sprouts, cabbage, carrots, celery, collard greens, cucumbers, eggplant, kale, leeks, lemons, lettuce, mushrooms, okra, onions, peas, peppers, potatoes, pumpkin, radishes, rutabaga, shallots, spinach, sweet potatoes, turnips, zucchini - Fruits common to the Mediterranean Diet include: apples, apricots, avocados, cherries, clementines, dates, figs, grapefruits, grapes, melons, nectarines, oranges, peaches, pears, pomegranates, strawberries, tangerines  Fats - Replace butter and margarine with healthy oils, such as olive oil, canola oil, and tahini  - Limit nuts to no more than a handful a day  - Nuts include walnuts, almonds, pecans, pistachios, pine nuts  - Limit or avoid candied, honey roasted or heavily salted nuts - Olives are central to the [employer] - can be eaten whole or used in a variety of dishes   Meats Protein - Limiting red meat: no more than a few times a month - When eating red meat: choose lean cuts and keep the portion to the size of deck of cards - Eggs: approx. 0 to 4 times a week  - Fish and lean poultry: at least 2 a week  - Healthy protein sources include, chicken, [place], lean beef, lamb - Increase intake of seafood such as tuna, salmon, trout, mackerel, shrimp, scallops - Avoid or limit high fat processed meats such as sausage and bacon  Dairy - Include moderate amounts of low fat dairy products  - Focus on healthy dairy such as fat free yogurt, skim milk, low or reduced fat cheese - Limit dairy products higher in fat such as whole or 2% milk, cheese, ice cream  Alcohol - Moderate amounts of red wine is ok  - No more than 5 oz daily for women (all ages) and men older than age 86  - No more than 10 oz of wine daily for men younger than 46  Other - Limit sweets and other desserts  - Use herbs and spices instead of salt to flavor foods  - Herbs and spices common to the traditional Mediterranean Diet include:  basil, bay leaves, chives, cloves, cumin, fennel, garlic, lavender, marjoram, mint, oregano, parsley, pepper, rosemary, sage, savory, sumac, tarragon, thyme   It's not just a diet, it's a lifestyle:  . The Mediterranean diet includes lifestyle factors typical of those in the region  . Foods, drinks and meals are best eaten with others and savored . Daily physical activity is important for overall good health . This could be strenuous exercise like running and aerobics . This could also be more leisurely activities such as walking, housework, yard-work, or taking the stairs . Moderation is the key; a balanced and healthy diet accommodates most foods and drinks . Consider portion sizes and frequency of consumption of certain foods   Meal Ideas & Options:  . Breakfast:  o Whole wheat toast or whole wheat English  muffins with peanut butter & hard boiled egg o Steel cut oats topped with apples & cinnamon and skim milk  o Fresh fruit: banana, strawberries, melon, berries, peaches  o Smoothies: strawberries, bananas, greek yogurt, peanut butter o Low fat greek yogurt with blueberries and granola  o Egg white omelet with spinach and mushrooms o Breakfast couscous: whole wheat couscous, apricots, skim milk, cranberries  . Sandwiches:  o Hummus and grilled vegetables (peppers, zucchini, squash) on whole wheat bread   o Grilled chicken on whole wheat pita with lettuce, tomatoes, cucumbers or tzatziki  o Tuna salad on whole wheat bread: tuna salad made with greek yogurt, olives, red peppers, capers, green onions o Garlic rosemary lamb pita: lamb sauted with garlic, rosemary, salt & pepper; add lettuce, cucumber, greek yogurt to pita - flavor with lemon juice and black pepper  . Seafood:  o Mediterranean grilled salmon, seasoned with garlic, basil, parsley, lemon juice and black pepper o Shrimp, lemon, and spinach whole-grain pasta salad made with low fat greek yogurt  o Seared scallops with lemon orzo  o Seared tuna steaks seasoned salt, pepper, coriander topped with tomato mixture of olives, tomatoes, olive oil, minced garlic, parsley, green onions and cappers  . Meats:  o Herbed greek chicken salad with kalamata olives, cucumber, feta  o Red bell peppers stuffed with spinach, bulgur, lean ground beef (or lentils) & topped with feta   o Kebabs: skewers of chicken, tomatoes, onions, zucchini, squash  o [place] burgers: made with red onions, mint, dill, lemon juice, feta cheese topped with roasted red peppers . Vegetarian o Cucumber salad: cucumbers, artichoke hearts, celery, red onion, feta cheese, tossed in olive oil & lemon juice  o Hummus and whole grain pita points with a greek salad (lettuce, tomato, feta, olives, cucumbers, red onion) o Lentil soup with celery, carrots made with vegetable broth,  garlic, salt and pepper  o Tabouli salad: parsley, bulgur, mint, scallions, cucumbers, tomato, radishes, lemon juice, olive oil, salt and pepper.    Insomnia Insomnia is frequent trouble falling and/or staying asleep. Insomnia can be a long term problem or a short term problem. Both are common. Insomnia can be a short term problem when the wakefulness is related to a certain stress or worry. Long term insomnia is often related to ongoing stress during waking hours and/or poor sleeping habits. Overtime, sleep deprivation itself can make the problem worse. Every little thing feels more severe because you are overtired and your ability to cope is decreased. CAUSES   Stress, anxiety, and depression.  Poor sleeping habits.  Distractions such as TV in the bedroom.  Naps close to bedtime.  Engaging in emotionally charged conversations before  bed.  Technical reading before sleep.  Alcohol and other sedatives. They may make the problem worse. They can hurt normal sleep patterns and normal dream activity.  Stimulants such as caffeine for several hours prior to bedtime.  Pain syndromes and shortness of breath can cause insomnia.  Exercise late at night.  Changing time zones may cause sleeping problems (jet lag). It is sometimes helpful to have someone observe your sleeping patterns. They should look for periods of not breathing during the night (sleep apnea). They should also look to see how long those periods last. If you live alone or observers are uncertain, you can also be observed at a sleep clinic where your sleep patterns will be professionally monitored. Sleep apnea requires a checkup and treatment. Give your caregivers your medical history. Give your caregivers observations your family has made about your sleep.  SYMPTOMS   Not feeling rested in the morning.  Anxiety and restlessness at bedtime.  Difficulty falling and staying asleep. TREATMENT   Your caregiver may prescribe  treatment for an underlying medical disorders. Your caregiver can give advice or help if you are using alcohol or other drugs for self-medication. Treatment of underlying problems will usually eliminate insomnia problems.  Medications can be prescribed for short time use. They are generally not recommended for lengthy use.  Over-the-counter sleep medicines are not recommended for lengthy use. They can be habit forming.  You can promote easier sleeping by making lifestyle changes such as:  Using relaxation techniques that help with breathing and reduce muscle tension.  Exercising earlier in the day.  Changing your diet and the time of your last meal. No night time snacks.  Establish a regular time to go to bed.  Counseling can help with stressful problems and worry.  Soothing music and white noise may be helpful if there are background noises you cannot remove.  Stop tedious detailed work at least one hour before bedtime. HOME CARE INSTRUCTIONS   Keep a diary. Inform your caregiver about your progress. This includes any medication side effects. See your caregiver regularly. Take note of:  Times when you are asleep.  Times when you are awake during the night.  The quality of your sleep.  How you feel the next day. This information will help your caregiver care for you.  Get out of bed if you are still awake after 15 minutes. Read or do some quiet activity. Keep the lights down. Wait until you feel sleepy and go back to bed.  Keep regular sleeping and waking hours. Avoid naps.  Exercise regularly.  Avoid distractions at bedtime. Distractions include watching television or engaging in any intense or detailed activity like attempting to balance the household checkbook.  Develop a bedtime ritual. Keep a familiar routine of bathing, brushing your teeth, climbing into bed at the same time each night, listening to soothing music. Routines increase the success of falling to sleep  faster.  Use relaxation techniques. This can be using breathing and muscle tension release routines. It can also include visualizing peaceful scenes. You can also help control troubling or intruding thoughts by keeping your mind occupied with boring or repetitive thoughts like the old concept of counting sheep. You can make it more creative like imagining planting one beautiful flower after another in your backyard garden.  During your day, work to eliminate stress. When this is not possible use some of the previous suggestions to help reduce the anxiety that accompanies stressful situations. MAKE SURE YOU:  Understand these instructions.  Will watch your condition.  Will get help right away if you are not doing well or get worse. Document Released: 01/02/2000 Document Revised: 03/29/2011 Document Reviewed: 02/01/2007 [facility] Patient Information 2015 [place], [place]. This information is not intended to replace advice given to you by your health care provider. Make sure you discuss any questions you have with your health care provider.   You can try over-the-counter MELATONIN for sleep; [employer] brand is good and they make a dissolvable tablet that you can use at bedtime to help you relax and rest better.   A REGULAR EXERCISE ROUTINE can help improve your sleep. Check into getting into [facility] [employer] [facility] which is covered by MEDICARE; [facility] as well as exercise classes in the pool. If you enjoy swimming, that would be a great non-weightbearing exercise for you. Also, [employer] is great for relaxation, balance and core strengthening and muscle tone. These classes may be offered at the [facility] and other locations in [place].

## 2014-04-07 ENCOUNTER — Encounter: Payer: Self-pay | Admitting: Family Medicine

## 2014-04-08 ENCOUNTER — Encounter: Payer: Self-pay | Admitting: Gastroenterology

## 2014-04-08 ENCOUNTER — Ambulatory Visit (AMBULATORY_SURGERY_CENTER): Payer: Medicare Other | Admitting: Gastroenterology

## 2014-04-08 VITALS — BP 123/74 | HR 75 | Temp 97.0°F | Resp 18 | Ht 61.0 in | Wt 164.0 lb

## 2014-04-08 DIAGNOSIS — K635 Polyp of colon: Secondary | ICD-10-CM

## 2014-04-08 DIAGNOSIS — D125 Benign neoplasm of sigmoid colon: Secondary | ICD-10-CM

## 2014-04-08 DIAGNOSIS — K5730 Diverticulosis of large intestine without perforation or abscess without bleeding: Secondary | ICD-10-CM

## 2014-04-08 DIAGNOSIS — D122 Benign neoplasm of ascending colon: Secondary | ICD-10-CM

## 2014-04-08 DIAGNOSIS — D120 Benign neoplasm of cecum: Secondary | ICD-10-CM

## 2014-04-08 DIAGNOSIS — Z1211 Encounter for screening for malignant neoplasm of colon: Secondary | ICD-10-CM | POA: Diagnosis not present

## 2014-04-08 DIAGNOSIS — Z800 Family history of malignant neoplasm of digestive organs: Secondary | ICD-10-CM | POA: Diagnosis not present

## 2014-04-08 MED ORDER — SODIUM CHLORIDE 0.9 % IV SOLN: 500.0000 mL | INTRAVENOUS | Status: DC

## 2014-04-08 NOTE — Progress Notes (Signed)
Called to room to assist during endoscopic procedure.  Patient ID and intended procedure confirmed with present staff. Received instructions for my participation in the procedure from the performing physician.  

## 2014-04-08 NOTE — Progress Notes (Signed)
Report to PACU, RN, vss, BBS= Clear.  

## 2014-04-08 NOTE — Patient Instructions (Signed)
Colon polyps removed today, diverticulosis and hemorrhoids seen. Handouts given on polyps,diverticulosis,high fiber diet, and hemorrhoids and hemorrhoidal banding. Use over the counter hemorrhoidal suppositories. Call us if needed. Thank you.  YOU HAD AN ENDOSCOPIC PROCEDURE TODAY AT [facility]:   Refer to the procedure report that was given to you for any specific questions about what was found during the examination.  If the procedure report does not answer your questions, please call your gastroenterologist to clarify.  If you requested that your care partner not be given the details of your procedure findings, then the procedure report has been included in a sealed envelope for you to review at your convenience later.  YOU SHOULD EXPECT: Some feelings of bloating in the abdomen. Passage of more gas than usual.  Walking can help get rid of the air that was put into your GI tract during the procedure and reduce the bloating. If you had a lower endoscopy (such as a colonoscopy or flexible sigmoidoscopy) you may notice spotting of blood in your stool or on the toilet paper. If you underwent a bowel prep for your procedure, you may not have a normal bowel movement for a few days.  Please Note:  You might notice some irritation and congestion in your nose or some drainage.  This is from the oxygen used during your procedure.  There is no need for concern and it should clear up in a day or so.  SYMPTOMS TO REPORT IMMEDIATELY:   Following lower endoscopy (colonoscopy or flexible sigmoidoscopy):  Excessive amounts of blood in the stool  Significant tenderness or worsening of abdominal pains  Swelling of the abdomen that is new, acute  Fever of 100F or higher   Following upper endoscopy (EGD)  Vomiting of blood or coffee ground material  New chest pain or pain under the shoulder blades  Painful or persistently difficult swallowing  New shortness of breath  Fever of 100F or  higher  Black, tarry-looking stools  For urgent or emergent issues, a gastroenterologist can be reached at any hour by calling [phone].   DIET: Your first meal following the procedure should be a small meal and then it is ok to progress to your normal diet. Heavy or fried foods are harder to digest and may make you feel nauseous or bloated.  Likewise, meals heavy in dairy and vegetables can increase bloating.  Drink plenty of fluids but you should avoid alcoholic beverages for 24 hours.  ACTIVITY:  You should plan to take it easy for the rest of today and you should NOT DRIVE or use heavy machinery until tomorrow (because of the sedation medicines used during the test).    FOLLOW UP: Our staff will call the number listed on your records the next business day following your procedure to check on you and address any questions or concerns that you may have regarding the information given to you following your procedure. If we do not reach you, we will leave a message.  However, if you are feeling well and you are not experiencing any problems, there is no need to return our call.  We will assume that you have returned to your regular daily activities without incident.  If any biopsies were taken you will be contacted by phone or by letter within the next 1-3 weeks.  Please call us at [phone] if you have not heard about the biopsies in 3 weeks.    SIGNATURES/CONFIDENTIALITY: You and/or your care partner  have signed paperwork which will be entered into your electronic medical record.  These signatures attest to the fact that that the information above on your After Visit Summary has been reviewed and is understood.  Full responsibility of the confidentiality of this discharge information lies with you and/or your care-partner.

## 2014-04-08 NOTE — Op Note (Signed)
[place]  [address] [place], [address]   COLONOSCOPY PROCEDURE REPORT  PATIENT: Pamela Daniel, Pamela Daniel  MR#: [id number] BIRTHDATE: 12/27/1950 , 47  yrs. old GENDER: female ENDOSCOPIST: Inda Castle, MD REFERRED MC:NOBSJ Guest, M.D. PROCEDURE DATE:  04/08/2014 PROCEDURE:   Colonoscopy with snare polypectomy, Colonoscopy with cold biopsy polypectomy, and Colonoscopy, screening First Screening Colonoscopy - Avg.  risk and is 50 yrs.  old or older - No.  Prior Negative Screening - Now for repeat screening. Above average risk  History of Adenoma - Now for follow-up colonoscopy & has been > or = to 3 yrs.  N/A ASA CLASS:   Class II INDICATIONS:FH Colon or Rectal Adenocarcinoma. MEDICATIONS: Monitored anesthesia care and Propofol 200 mg IV  DESCRIPTION OF PROCEDURE:   After the risks benefits and alternatives of the procedure were thoroughly explained, informed consent was obtained.  The digital rectal exam revealed hemorrhoids, Grade III.   The [id number]  endoscope was introduced through the anus and advanced to the cecum, which was identified by both the appendix and ileocecal valve. No adverse events experienced.   The quality of the prep was (Suprep was used) excellent.  The instrument was then slowly withdrawn as the colon was fully examined.      COLON FINDINGS: A flat polyp measuring 2 mm in size was found at the cecum.  A polypectomy was performed with cold forceps.   There was mild diverticulosis noted in the ascending colon.   A sessile polyp measuring 3 mm in size was found in the sigmoid colon.  A polypectomy was performed with a cold snare.  The resection was complete, the polyp tissue was completely retrieved and sent to histology.   Two flat polyps measuring 2 mm in size were found in the ascending colon and at the cecum.  A polypectomy was performed with cold forceps.  A polypectomy was performed with cold forceps. Retroflexed  views revealed no abnormalities and Retroflexed views revealed internal Grade III hemorrhoids. The time to cecum = 1.7 Withdrawal time = 9.8   The scope was withdrawn and the procedure completed. COMPLICATIONS: There were no immediate complications.  ENDOSCOPIC IMPRESSION: 1.   Flat polyp was found at the cecum; polypectomy was performed with cold forceps 2.   Mild diverticulosis was noted in the ascending colon 3.   Sessile polyp was found in the sigmoid colon; polypectomy was performed with a cold snare 4.   Two flat polyps were found in the ascending colon and at the cecum; polypectomy was performed with cold forceps; polypectomy was performed with cold forceps  RECOMMENDATIONS: If the polyp(s) removed today are proven to be adenomatous (pre-cancerous) polyps, you will need a colonoscopy in 3 years. Otherwise you should undergo follow-up colonoscopy in 5 years because of your family history.  You will receive a letter within 1-2 weeks with the results of your biopsy as well as final recommendations.  Please call my office if you have not received a letter after 3 weeks. hemorrhoidal suppositories; to consider band ligation of internal hemorrhoids  eSigned:  Inda Castle, MD 04/08/2014 10:08 AM   cc:   PATIENT NAME:  Anthonette, Lesage MR#: [id number]

## 2014-04-09 ENCOUNTER — Telehealth: Payer: Self-pay | Admitting: *Deleted

## 2014-04-09 NOTE — Telephone Encounter (Signed)
Attempted to call x3,no response,no voicemail.

## 2014-04-15 ENCOUNTER — Encounter: Payer: Self-pay | Admitting: Gastroenterology

## 2014-04-17 ENCOUNTER — Ambulatory Visit (INDEPENDENT_AMBULATORY_CARE_PROVIDER_SITE_OTHER): Payer: Medicare Other | Admitting: Gynecology

## 2014-04-17 ENCOUNTER — Encounter: Payer: Self-pay | Admitting: Gynecology

## 2014-04-17 VITALS — BP 136/88

## 2014-04-17 DIAGNOSIS — N890 Mild vaginal dysplasia: Secondary | ICD-10-CM | POA: Insufficient documentation

## 2014-04-17 NOTE — Progress Notes (Signed)
   Patient presented to the office today for colposcopic evaluation. She was seen the office as a new patient for gynecological examination on 03/26/2014. Patient stated that she had a past history of transvaginal hysterectomy with bilateral salpingo-oophorectomy. She denied any prior history of any abnormal Pap smear. At time of her annual exam there appeared to be a raw appearing area at the vaginal cuff suspicious for severe vaginal atrophy she was started on vaginal estrogen cream twice a week.  On the most recent office visit her Pap smear had demonstrated the following:  Diagnosis LOW GRADE SQUAMOUS INTRAEPITHELIAL LESION: CIN-1/ HPV (LSIL). Enid Cutter MD Pathologist, Electronic Signature (Case signed 03/28/2014) Source Vaginal Pap [ThinPrep Imaged]  Ancillary Testing HPV 16,18/45 Genotyping NEGATIVE for HPV 16 & 18/45 Completed by PT2 on 2014-04-04  HPV High Risk **DETECTED** Completed by [id number] on 2014-03-27  Patient underwent a detail colposcopic evaluation to include the external genitalia, perineum, and perirectal region. No external lesions were noted. The speculum was introduced into the vagina. Acetic as was applied. The vaginal cuff area once again demonstrated a friable area along with a small leukoplakic area for which was biopsied.  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 Colposcopic directed biopsy to the area was undertaken today biopsy result pending at time of this dictation. We discussed that if VAIN 1 is confirmed that several options include treatment with trichloroacetic acid in the office her outpatient treatment with CO2 laser or office treatment with cryotherapy. Literature information was provided. We'll await the results and manage accordingly.

## 2014-04-17 NOTE — Addendum Note (Signed)
Addended by: Thurnell Garbe A on: 04/17/2014 12:51 PM   Modules accepted: Orders

## 2014-04-17 NOTE — Patient Instructions (Signed)
Colposcopy Care After Colposcopy is a procedure in which a special tool is used to magnify the surface of the cervix. A tissue sample (biopsy) may also be taken. This sample will be looked at for cervical cancer or other problems. After the test:  You may have some cramping.  Lie down for a few minutes if you feel lightheaded.   You may have some bleeding which should stop in a few days. HOME CARE  Do not have sex or use tampons for 2 to 3 days or as told.  Only take medicine as told by your doctor.  Continue to take your birth control pills as usual. Finding out the results of your test Ask when your test results will be ready. Make sure you get your test results. GET HELP RIGHT AWAY IF:  You are bleeding a lot or are passing blood clots.  You develop a fever of 102 F (38.9 C) or higher.  You have abnormal vaginal discharge.  You have cramps that do not go away with medicine.  You feel lightheaded, dizzy, or pass out (faint). MAKE SURE YOU:   Understand these instructions.  Will watch your condition.  Will get help right away if you are not doing well or get worse. Document Released: 06/23/2007 Document Revised: 03/29/2011 Document Reviewed: 08/03/2012 [facility] Patient Information 2015 [place], [place]. This information is not intended to replace advice given to you by your health care provider. Make sure you discuss any questions you have with your health care provider.

## 2014-05-30 ENCOUNTER — Encounter: Payer: Self-pay | Admitting: Gastroenterology

## 2014-05-30 ENCOUNTER — Encounter: Payer: Self-pay | Admitting: Internal Medicine

## 2014-06-11 ENCOUNTER — Telehealth: Payer: Self-pay

## 2014-06-11 NOTE — Telephone Encounter (Signed)
Pt dropping off form to be filled out,  Best phone for pt is [phone]

## 2014-06-11 NOTE — Telephone Encounter (Signed)
Placed form in Dr Luiz Ochoa box.

## 2014-06-18 NOTE — Telephone Encounter (Signed)
Dr. Elder Cyphers states Dr. Leward Quan should fill this out since she did her annual physical. Since Dr. Leward Quan is not here anymore he states to send to Dr. Tamala Julian to fill out. Form placed in Dr. Thompson Caul box.

## 2014-06-19 ENCOUNTER — Telehealth: Payer: Self-pay | Admitting: Family Medicine

## 2014-06-19 NOTE — Telephone Encounter (Signed)
Spoke with Ms Pamela Daniel and she stated that she need the forms ASAP so that she can get her grandson into camp if they still have a spot now she states that her form been here over a week and really need to get her grandson into camp I let Ms Pamela Daniel know that we will call her when forms is ready

## 2014-06-19 NOTE — Telephone Encounter (Signed)
Patient is in need of the form dropped off on May  24th.    Please call asap [phone] (M)

## 2014-06-20 NOTE — Telephone Encounter (Signed)
Please call pt --- forms are ready for pick up.

## 2014-06-20 NOTE — Telephone Encounter (Signed)
Called pt to let her know forms are ready. I called the number below and it has no voicemail.

## 2014-06-20 NOTE — Telephone Encounter (Signed)
Patient called again about her forms. She states that her form has been up here since May 24th and it has to be turned in today otherwise her grandson will be unable to attend camp. Please see previous phone message for more information.  [phone]

## 2014-06-21 NOTE — Telephone Encounter (Signed)
Called all the numbers that Ms Ailah had plus her daughters phone no answer cant leave message to pick forms up

## 2014-06-25 ENCOUNTER — Encounter: Payer: Self-pay | Admitting: *Deleted

## 2014-06-25 NOTE — Telephone Encounter (Signed)
I had Pamela Daniel check p/up drawer and forms are no longer in drawer for pt, so it looks like pt came in to p/up already.

## 2014-07-31 ENCOUNTER — Ambulatory Visit: Payer: Medicare Other | Admitting: Gynecology

## 2014-08-29 ENCOUNTER — Telehealth: Payer: Self-pay | Admitting: *Deleted

## 2014-08-29 NOTE — Telephone Encounter (Signed)
Faxed most recent mammo request to [facility].  Will update once received.

## 2014-08-29 NOTE — Telephone Encounter (Signed)
[facility] called into [facility] voicemail on 08/29/14 regarding your fax for results. They stated they are on EPIC as well and the results should be able to be viewed there. If not then they do not have any results yet.

## 2014-09-09 ENCOUNTER — Encounter: Payer: Self-pay | Admitting: *Deleted

## 2014-10-18 ENCOUNTER — Encounter: Payer: Self-pay | Admitting: *Deleted

## 2014-10-18 ENCOUNTER — Emergency Department: Payer: Medicare Other

## 2014-10-18 ENCOUNTER — Emergency Department
Admission: EM | Admit: 2014-10-18 | Discharge: 2014-10-18 | Disposition: A | Discharge status: Home / Self Care | Payer: Medicare Other | Attending: Emergency Medicine | Admitting: Emergency Medicine

## 2014-10-18 DIAGNOSIS — J069 Acute upper respiratory infection, unspecified: Secondary | ICD-10-CM

## 2014-10-18 DIAGNOSIS — I10 Essential (primary) hypertension: Secondary | ICD-10-CM | POA: Diagnosis not present

## 2014-10-18 DIAGNOSIS — Z79899 Other long term (current) drug therapy: Secondary | ICD-10-CM | POA: Diagnosis not present

## 2014-10-18 DIAGNOSIS — H9203 Otalgia, bilateral: Secondary | ICD-10-CM | POA: Diagnosis not present

## 2014-10-18 DIAGNOSIS — Z7982 Long term (current) use of aspirin: Secondary | ICD-10-CM | POA: Diagnosis not present

## 2014-10-18 DIAGNOSIS — R05 Cough: Secondary | ICD-10-CM | POA: Diagnosis present

## 2014-10-18 LAB — POCT RAPID STREP A: STREPTOCOCCUS, GROUP A SCREEN (DIRECT): NEGATIVE

## 2014-10-18 MED ORDER — FLUTICASONE PROPIONATE 50 MCG/ACT NA SUSP: 1.0000 | Freq: Every day | NASAL | Status: DC

## 2014-10-18 MED ORDER — LIDOCAINE VISCOUS 2 % MT SOLN
15.0000 mL | Freq: Once | OROMUCOSAL | Status: AC
  Administered 2014-10-18: 15 mL via OROMUCOSAL
  Filled 2014-10-18: qty 15

## 2014-10-18 NOTE — ED Notes (Signed)
Thinks she may have strep throat

## 2014-10-18 NOTE — ED Notes (Signed)
Pt seen and assessed by provider, see provider notes for details,

## 2014-10-18 NOTE — ED Notes (Signed)
Pt reports cough, sore throat, ear pain and congestion for 5-6 days.

## 2014-10-18 NOTE — ED Provider Notes (Signed)
[facility] Emergency Department Provider Note ____________________________________________  Time seen: 2010  I have reviewed the triage vital signs and the nursing notes.  HISTORY  Chief Complaint  Sore Throat and Cough  HPI Pamela Daniel is a 64 y.o. female was to the ED with 6 day complaint of cold symptoms, including cough sore throat and bilateral earache. She reports that the cough seems to be worse at night, and she notes some subjective fevers over the last few days. She has dose Benadryl without significant benefit to her symptoms.She is concerned that she may have a strep throat infection, due to throat discomfort with swallowing.  Past Medical History  Diagnosis Date  . PSVT (paroxysmal supraventricular tachycardia)   . Hiatal hernia   . Hypertension   . Anxiety   . Asthma   . GERD (gastroesophageal reflux disease)   . Arthritis     SPINE  . TIA (transient ischemic attack)   . SVT (supraventricular tachycardia)     Patient Active Problem List   Diagnosis Date Noted  . VAIN I (vaginal intraepithelial neoplasia grade I) 04/17/2014  . TIA (transient ischemic attack) 12/22/2011  . SVT/ PSVT/ PAT 01/13/2010  . RECTAL BLEEDING 08/01/2007  . DIARRHEA 08/01/2007  . ESOPHAGEAL STRICTURE 07/31/2007  . HIATAL HERNIA 07/31/2007  . COLONIC POLYPS, HX OF 07/31/2007  . HYPERTENSION 03/14/2007  . G E R D 03/14/2007    Past Surgical History  Procedure Laterality Date  . Cardiac electrophysiology mapping and ablation  2011  . Nissan fundoplication  7353  . Foot neuroma surgery    . Nasal septum surgery    . Tee without cardioversion  02/15/2012    Procedure: TRANSESOPHAGEAL ECHOCARDIOGRAM (TEE);  Surgeon: Laverda Page, MD;  Location: [facility];  Service: Cardiovascular;  Laterality: N/A;  . Abdominal hysterectomy  1996    BSO    Current Outpatient Rx  Name  Route  Sig  Dispense  Refill  . albuterol (PROVENTIL HFA;VENTOLIN HFA) 108  (90 BASE) MCG/ACT inhaler   Inhalation   Inhale 2 puffs into the lungs every 4 (four) hours as needed. Shortness of breath/wheezing   1 Inhaler   2   . ALPRAZolam (XANAX) 0.25 MG tablet      Tale 1/2 to 1 tablet at bedtime as needed for sleep. Patient not taking: Reported on 04/08/2014   30 tablet   0   . aspirin 81 MG chewable tablet   Oral   Chew 1 tablet (81 mg total) by mouth daily.   30 tablet   11   . fluticasone (FLONASE) 50 MCG/ACT nasal spray   Each Nare   Place 1 spray into both nostrils daily.   16 g   0   . ibuprofen (ADVIL,MOTRIN) 200 MG tablet   Oral   Take 200 mg by mouth every 6 (six) hours as needed for moderate pain.         . mirabegron ER (MYRBETRIQ) 25 MG TB24 tablet   Oral   Take 1 tablet (25 mg total) by mouth daily. Patient not taking: Reported on 03/27/2014   30 tablet   11   . NONFORMULARY OR COMPOUNDED ITEM      Estradiol 0.02 % 56ml prefilled applicator Sig: apply twice a week   24 each   4    Allergies Review of patient's allergies indicates no known allergies.  Family History  Problem Relation Age of Onset  . Heart disease Mother 24  Heart failure >> death at age 25  . Dementia Mother   . Hypertension Mother   . Cancer Mother     Breast and colon  . Breast cancer Mother 45  . Heart disease Father 16    MI  . COPD Father   . Heart disease Brother   . Mental illness Brother   . Cancer Brother 33    Lung and bone  . Cancer Brother     Lung and bone  . Cancer Brother     Colon   Social History Social History  Substance Use Topics  . Smoking status: Never Smoker   . Smokeless tobacco: Never Used  . Alcohol Use: 0.0 oz/week    0 Standard drinks or equivalent per week     Comment: RARE   Review of Systems  Constitutional: Negative for fever. Eyes: Negative for visual changes. ENT: Positive for sore throat & earache Cardiovascular: Negative for chest pain. Respiratory: Negative for shortness of breath. Reports  cough Gastrointestinal: Negative for abdominal pain, vomiting and diarrhea. Genitourinary: Negative for dysuria. Musculoskeletal: Negative for back pain. Skin: Negative for rash. Neurological: Negative for headaches, focal weakness or numbness. ____________________________________________  PHYSICAL EXAM:  VITAL SIGNS: ED Triage Vitals  Enc Vitals Group     BP 10/18/14 1919 145/88 mmHg     Pulse Rate 10/18/14 1919 87     Resp 10/18/14 1919 18     Temp 10/18/14 1919 98.1 F (36.7 C)     Temp Source 10/18/14 1919 Oral     SpO2 10/18/14 1919 96 %     Weight 10/18/14 1919 160 lb (72.576 kg)     Height 10/18/14 1919 5\' 1"  (1.549 m)     Head Cir --      Peak Flow --      Pain Score --      Pain Loc --      Pain Edu? --      Excl. in [place]? --    Constitutional: Alert and oriented. Well appearing and in no distress. Eyes: Conjunctivae are normal. PERRL. Normal extraocular movements. Ears: TMs intact. Canals clear.    Head: Normocephalic and atraumatic.   Nose: No congestion/rhinorrhea.   Mouth/Throat: Mucous membranes are moist. Uvula midline without erythema. Tonsils small without exudate.    Neck: Supple. No thyromegaly. Hematological/Lymphatic/Immunological: No cervical lymphadenopathy. Cardiovascular: Normal rate, regular rhythm.  Respiratory: Normal respiratory effort. No wheezes/rales/rhonchi. Gastrointestinal: Soft and nontender. No distention. Musculoskeletal: Nontender with normal range of motion in all extremities.  Neurologic:  Normal gait without ataxia. Normal speech and language. No gross focal neurologic deficits are appreciated. Skin:  Skin is warm, dry and intact. No rash noted. Psychiatric: Mood and affect are normal. Patient exhibits appropriate insight and judgment. ____________________________________________   LABS (pertinent positives/negatives) Labs Reviewed  POCT RAPID STREP A  ____________________________________________    RADIOLOGY  CXR  IMPRESSION: No edema or consolidation.  I, Menshew, Dannielle Karvonen, personally viewed and evaluated these images (plain radiographs) as part of my medical decision making.  ____________________________________________  INITIAL IMPRESSION / ASSESSMENT AND PLAN / ED COURSE  URI symptoms likely consistent with viral etiology. Negative rapid strep with pending throat culture. Low clinical suspicion of bacterial process. Will prescribe Flonase, and encourage OTC allergy medicine, and cough medicine. Follow-up with Dr. Elder Cyphers for ongoing symptoms.  ____________________________________________  FINAL CLINICAL IMPRESSION(S) / ED DIAGNOSES  Final diagnoses:  URI (upper respiratory infection)      Jenise V Modesta Messing, PA-C  10/18/14 2326  Harvest Dark, MD 10/19/14 2322

## 2014-10-18 NOTE — ED Notes (Signed)
Pt seen and assessed by provider, see provider notes for details.

## 2014-10-18 NOTE — Discharge Instructions (Signed)
Upper Respiratory Infection, Adult An upper respiratory infection (URI) is also sometimes known as the common cold. The upper respiratory tract includes the nose, sinuses, throat, trachea, and bronchi. Bronchi are the airways leading to the lungs. Most people improve within 1 week, but symptoms can last up to 2 weeks. A residual cough may last even longer.  CAUSES Many different viruses can infect the tissues lining the upper respiratory tract. The tissues become irritated and inflamed and often become very moist. Mucus production is also common. A cold is contagious. You can easily spread the virus to others by oral contact. This includes kissing, sharing a glass, coughing, or sneezing. Touching your mouth or nose and then touching a surface, which is then touched by another person, can also spread the virus. SYMPTOMS  Symptoms typically develop 1 to 3 days after you come in contact with a cold virus. Symptoms vary from person to person. They may include:  Runny nose.  Sneezing.  Nasal congestion.  Sinus irritation.  Sore throat.  Loss of voice (laryngitis).  Cough.  Fatigue.  Muscle aches.  Loss of appetite.  Headache.  Low-grade fever. DIAGNOSIS  You might diagnose your own cold based on familiar symptoms, since most people get a cold 2 to 3 times a year. Your caregiver can confirm this based on your exam. Most importantly, your caregiver can check that your symptoms are not due to another disease such as strep throat, sinusitis, pneumonia, asthma, or epiglottitis. Blood tests, throat tests, and X-rays are not necessary to diagnose a common cold, but they may sometimes be helpful in excluding other more serious diseases. Your caregiver will decide if any further tests are required. RISKS AND COMPLICATIONS  You may be at risk for a more severe case of the common cold if you smoke cigarettes, have chronic heart disease (such as heart failure) or lung disease (such as asthma), or if  you have a weakened immune system. The very young and very old are also at risk for more serious infections. Bacterial sinusitis, middle ear infections, and bacterial pneumonia can complicate the common cold. The common cold can worsen asthma and chronic obstructive pulmonary disease (COPD). Sometimes, these complications can require emergency medical care and may be life-threatening. PREVENTION  The best way to protect against getting a cold is to practice good hygiene. Avoid oral or hand contact with people with cold symptoms. Wash your hands often if contact occurs. There is no clear evidence that vitamin C, vitamin E, echinacea, or exercise reduces the chance of developing a cold. However, it is always recommended to get plenty of rest and practice good nutrition. TREATMENT  Treatment is directed at relieving symptoms. There is no cure. Antibiotics are not effective, because the infection is caused by a virus, not by bacteria. Treatment may include:  Increased fluid intake. Sports drinks offer valuable electrolytes, sugars, and fluids.  Breathing heated mist or steam (vaporizer or shower).  Eating chicken soup or other clear broths, and maintaining good nutrition.  Getting plenty of rest.  Using gargles or lozenges for comfort.  Controlling fevers with ibuprofen or acetaminophen as directed by your caregiver.  Increasing usage of your inhaler if you have asthma. Zinc gel and zinc lozenges, taken in the first 24 hours of the common cold, can shorten the duration and lessen the severity of symptoms. Pain medicines may help with fever, muscle aches, and throat pain. A variety of non-prescription medicines are available to treat congestion and runny nose. Your caregiver  can make recommendations and may suggest nasal or lung inhalers for other symptoms.  HOME CARE INSTRUCTIONS   Only take over-the-counter or prescription medicines for pain, discomfort, or fever as directed by your  caregiver.  Use a warm mist humidifier or inhale steam from a shower to increase air moisture. This may keep secretions moist and make it easier to breathe.  Drink enough water and fluids to keep your urine clear or pale yellow.  Rest as needed.  Return to work when your temperature has returned to normal or as your caregiver advises. You may need to stay home longer to avoid infecting others. You can also use a face mask and careful hand washing to prevent spread of the virus. SEEK MEDICAL CARE IF:   After the first few days, you feel you are getting worse rather than better.  You need your caregiver's advice about medicines to control symptoms.  You develop chills, worsening shortness of breath, or brown or red sputum. These may be signs of pneumonia.  You develop yellow or brown nasal discharge or pain in the face, especially when you bend forward. These may be signs of sinusitis.  You develop a fever, swollen neck glands, pain with swallowing, or white areas in the back of your throat. These may be signs of strep throat. SEEK IMMEDIATE MEDICAL CARE IF:   You have a fever.  You develop severe or persistent headache, ear pain, sinus pain, or chest pain.  You develop wheezing, a prolonged cough, cough up blood, or have a change in your usual mucus (if you have chronic lung disease).  You develop sore muscles or a stiff neck. Document Released: 06/30/2000 Document Revised: 03/29/2011 Document Reviewed: 04/11/2013 [facility] Patient Information 2015 [place], [place]. This information is not intended to replace advice given to you by your health care provider. Make sure you discuss any questions you have with your health care provider.  Your labs and chest x-ray are normal today. Your symptoms and exam are consistent with a respiratory infection which is likely viral in nature.  Take an OTC cough syrup (Delsym or Robitussin) as well as a daily allergy medicine (Allegra, Claritin, Zyrtec).  You should continue the Benadryl at bedtime.  You may also mix and gargle equal parts of Benadryl elixir + Maalox as needed for sore throat pain relief. Take Tylenol or Motrin as needed for pain and fevers.

## 2014-11-18 ENCOUNTER — Emergency Department (HOSPITAL_COMMUNITY)
Admission: EM | Admit: 2014-11-18 | Discharge: 2014-11-18 | Disposition: A | Discharge status: Home / Self Care | Payer: Medicare Other | Attending: Emergency Medicine | Admitting: Emergency Medicine

## 2014-11-18 ENCOUNTER — Emergency Department (HOSPITAL_COMMUNITY): Payer: Medicare Other

## 2014-11-18 ENCOUNTER — Encounter (HOSPITAL_COMMUNITY): Payer: Self-pay | Admitting: Emergency Medicine

## 2014-11-18 DIAGNOSIS — R531 Weakness: Secondary | ICD-10-CM | POA: Diagnosis not present

## 2014-11-18 DIAGNOSIS — Z8673 Personal history of transient ischemic attack (TIA), and cerebral infarction without residual deficits: Secondary | ICD-10-CM | POA: Diagnosis not present

## 2014-11-18 DIAGNOSIS — R42 Dizziness and giddiness: Secondary | ICD-10-CM | POA: Insufficient documentation

## 2014-11-18 DIAGNOSIS — I10 Essential (primary) hypertension: Secondary | ICD-10-CM | POA: Diagnosis not present

## 2014-11-18 DIAGNOSIS — M1990 Unspecified osteoarthritis, unspecified site: Secondary | ICD-10-CM | POA: Diagnosis not present

## 2014-11-18 DIAGNOSIS — Z79899 Other long term (current) drug therapy: Secondary | ICD-10-CM | POA: Insufficient documentation

## 2014-11-18 DIAGNOSIS — J45909 Unspecified asthma, uncomplicated: Secondary | ICD-10-CM | POA: Insufficient documentation

## 2014-11-18 DIAGNOSIS — Z7982 Long term (current) use of aspirin: Secondary | ICD-10-CM | POA: Diagnosis not present

## 2014-11-18 DIAGNOSIS — R002 Palpitations: Secondary | ICD-10-CM | POA: Insufficient documentation

## 2014-11-18 DIAGNOSIS — R079 Chest pain, unspecified: Secondary | ICD-10-CM | POA: Insufficient documentation

## 2014-11-18 DIAGNOSIS — F419 Anxiety disorder, unspecified: Secondary | ICD-10-CM | POA: Insufficient documentation

## 2014-11-18 DIAGNOSIS — Z8719 Personal history of other diseases of the digestive system: Secondary | ICD-10-CM | POA: Insufficient documentation

## 2014-11-18 DIAGNOSIS — Z7951 Long term (current) use of inhaled steroids: Secondary | ICD-10-CM | POA: Diagnosis not present

## 2014-11-18 LAB — BASIC METABOLIC PANEL
ANION GAP: 7 (ref 5–15)
BUN: 12 mg/dL (ref 6–20)
CHLORIDE: 106 mmol/L (ref 101–111)
CO2: 27 mmol/L (ref 22–32)
Calcium: 9 mg/dL (ref 8.9–10.3)
Creatinine, Ser: 0.82 mg/dL (ref 0.44–1.00)
GFR calc Af Amer: >60 mL/min (ref >60)
Glucose, Bld: 131 mg/dL — ABNORMAL HIGH (ref 65–99)
POTASSIUM: 3.7 mmol/L (ref 3.5–5.1)
SODIUM: 140 mmol/L (ref 135–145)

## 2014-11-18 LAB — CBC
HEMATOCRIT: 43.1 % (ref 36.0–46.0)
Hemoglobin: 14.3 g/dL (ref 12.0–15.0)
MCH: 31.4 pg (ref 26.0–34.0)
MCHC: 33.2 g/dL (ref 30.0–36.0)
MCV: 94.7 fL (ref 78.0–100.0)
PLATELETS: 217 10*3/uL (ref 150–400)
RBC: 4.55 MIL/uL (ref 3.87–5.11)
RDW: 13.1 % (ref 11.5–15.5)
WBC: 6.5 10*3/uL (ref 4.0–10.5)

## 2014-11-18 LAB — I-STAT TROPONIN, ED: Troponin i, poc: 0 ng/mL (ref 0.00–0.08)

## 2014-11-18 LAB — TSH: TSH: 1.252 u[IU]/mL (ref 0.350–4.500)

## 2014-11-18 MED ORDER — ALPRAZOLAM 0.25 MG PO TABS
0.5000 mg | ORAL_TABLET | Freq: Once | ORAL | Status: AC
  Administered 2014-11-18: 0.5 mg via ORAL
  Filled 2014-11-18: qty 2

## 2014-11-18 NOTE — ED Notes (Signed)
Pt. reports intermittent palpitations with SOB onset 3 days ago , no nausea or diaphoresis , mild chest pressure radiating to left arm .

## 2014-11-18 NOTE — Discharge Instructions (Signed)

## 2014-11-18 NOTE — ED Provider Notes (Signed)
CSN: [id number]     Arrival date & time 11/18/14  1930 History   First MD Initiated Contact with Patient 11/18/14 2109     Chief Complaint  Patient presents with  . Palpitations     (Consider location/radiation/quality/duration/timing/severity/associated sxs/prior Treatment) Patient is a 64 y.o. female presenting with palpitations. The history is provided by the patient.  Palpitations Palpitations quality:  Irregular Onset quality:  Sudden Timing:  Intermittent Progression:  Unchanged Chronicity:  New Context: anxiety (stress from people living in her house) and dehydration   Context: not caffeine   Relieved by:  Nothing Worsened by:  Nothing Associated symptoms: chest pressure (intermittent, heavy, nonradiating), dizziness (With the palpitations) and weakness (Mild, diffuse)   Associated symptoms: no cough, no shortness of breath, no syncope and no vomiting     Past Medical History  Diagnosis Date  . PSVT (paroxysmal supraventricular tachycardia) ([place])   . Hiatal hernia   . Hypertension   . Anxiety   . Asthma   . GERD (gastroesophageal reflux disease)   . Arthritis     SPINE  . TIA (transient ischemic attack)   . SVT (supraventricular tachycardia) [facility])    Past Surgical History  Procedure Laterality Date  . Cardiac electrophysiology mapping and ablation  2011  . Nissan fundoplication  [id number]  . Foot neuroma surgery    . Nasal septum surgery    . Tee without cardioversion  02/15/2012    Procedure: TRANSESOPHAGEAL ECHOCARDIOGRAM (TEE);  Surgeon: Laverda Page, MD;  Location: [facility] ENDOSCOPY;  Service: Cardiovascular;  Laterality: N/A;  . Abdominal hysterectomy  1996    BSO   Family History  Problem Relation Age of Onset  . Heart disease Mother 63    Heart failure >> death at age 14  . Dementia Mother   . Hypertension Mother   . Cancer Mother     Breast and colon  . Breast cancer Mother 25  . Heart disease Father 49    MI  . COPD Father   . Heart disease  Brother   . Mental illness Brother   . Cancer Brother 28    Lung and bone  . Cancer Brother     Lung and bone  . Cancer Brother     Colon   Social History  Substance Use Topics  . Smoking status: Never Smoker   . Smokeless tobacco: Never Used  . Alcohol Use: Yes     Comment: RARE   OB History    Gravida Para Term Preterm AB TAB SAB Ectopic Multiple Living   4 4  1      3      Review of Systems  Constitutional: Negative for fever and chills.  Respiratory: Negative for cough and shortness of breath.   Cardiovascular: Positive for palpitations. Negative for syncope.  Gastrointestinal: Negative for vomiting.  Neurological: Positive for dizziness (With the palpitations) and weakness (Mild, diffuse).  All other systems reviewed and are negative.     Allergies  Review of patient's allergies indicates no known allergies.  Home Medications   Prior to Admission medications   Medication Sig Start Date End Date Taking? Authorizing Provider  albuterol (PROVENTIL HFA;VENTOLIN HFA) 108 (90 BASE) MCG/ACT inhaler Inhale 2 puffs into the lungs every 4 (four) hours as needed. Shortness of breath/wheezing 03/15/14  Yes Barton Fanny, MD  aspirin 81 MG chewable tablet Chew 1 tablet (81 mg total) by mouth daily. 12/22/11  Yes Nolon Rod, DO  NONFORMULARY OR COMPOUNDED  ITEM Estradiol 0.02 % 88ml prefilled applicator Sig: apply twice a week 03/26/14  Yes Terrance Mass, MD  ALPRAZolam Duanne Moron) 0.25 MG tablet Tale 1/2 to 1 tablet at bedtime as needed for sleep. Patient not taking: Reported on 04/08/2014 04/04/14   Barton Fanny, MD  fluticasone [facility]) 50 MCG/ACT nasal spray Place 1 spray into both nostrils daily. 10/18/14   Jenise V Bacon Menshew, PA-C  mirabegron ER (MYRBETRIQ) 25 MG TB24 tablet Take 1 tablet (25 mg total) by mouth daily. Patient not taking: Reported on 03/27/2014 03/26/14   Terrance Mass, MD   BP 146/79 mmHg  Pulse 71  Temp(Src) 97.9 F (36.6 C) (Oral)  Resp 25   Ht 5\' 1"  (1.549 m)  Wt 158 lb (71.668 kg)  BMI 29.87 kg/m2  SpO2 98% Physical Exam  Constitutional: She is oriented to person, place, and time. She appears well-developed and well-nourished. No distress.  HENT:  Head: Normocephalic and atraumatic.  Mouth/Throat: Oropharynx is clear and moist.  Eyes: EOM are normal. Pupils are equal, round, and reactive to light.  Neck: Normal range of motion. Neck supple.  Cardiovascular: Normal rate and regular rhythm.  Exam reveals no friction rub.   No murmur heard. Pulmonary/Chest: Effort normal and breath sounds normal. No respiratory distress. She has no wheezes. She has no rales.  Abdominal: Soft. She exhibits no distension. There is no tenderness. There is no rebound.  Musculoskeletal: Normal range of motion. She exhibits no edema.  Neurological: She is alert and oriented to person, place, and time. No cranial nerve deficit or sensory deficit. She exhibits normal muscle tone. Coordination and gait normal. GCS eye subscore is 4. GCS verbal subscore is 5. GCS motor subscore is 6.  Normal tandem gait Normal rapid alternating movements  Skin: She is not diaphoretic.  Nursing note and vitals reviewed.   ED Course  Procedures (including critical care time) Labs Review Labs Reviewed  BASIC METABOLIC PANEL - Abnormal; Notable for the following:    Glucose, Bld 131 (*)    All other components within normal limits  CBC  TSH  I-STAT TROPOININ, ED    Imaging Review Dg Chest 2 View  11/18/2014  CLINICAL DATA:  Dizziness and shortness of Breath EXAM: CHEST - 2 VIEW COMPARISON:  10/18/2014 FINDINGS: The heart size and mediastinal contours are within normal limits. Both lungs are clear. The visualized skeletal structures are unremarkable. IMPRESSION: No active disease. Electronically Signed   By: Inez Catalina M.D.   On: 11/18/2014 20:21   I have personally reviewed and evaluated these images and lab results as part of my medical decision-making.    EKG Interpretation   Date/Time:  Monday November 18 2014 19:33:21 EDT Ventricular Rate:  68 PR Interval:  212 QRS Duration: 72 QT Interval:  408 QTC Calculation: 433 R Axis:   36 Text Interpretation:  Sinus rhythm with 1st degree A-V block Low voltage  QRS Borderline ECG No significant change since last tracing Confirmed by  Mingo Amber  MD, [place]ila ([id number]) on 11/18/2014 11:15:39 PM      MDM   Final diagnoses:  Palpitations    64 year old female here with palpitations. History of A. fib that required ablation, she's had multiple bouts of A. fib feeling type palpitations over the past several days. They last up to 30 minutes. There are no instigating factors. She denies any instigating factor such as cocaine, stimulants, change in medications. She is not currently on any rate control medications. She has some  occasional chest pain dizziness with the palpitations but has not otherwise. She also reports feeling like she is in a phone, like her brain is enveloped in some fall. Her daughter noted some mild cognitive slowing with tech sting, but otherwise patient is acting normal. Here vitals are stable, she is well appearing. No tachycardia episodes noted. He has a normal neuro exam, is alert and oriented, and is seems to be mentating fine. I think she is to follow-up with her primary care doctor as this is likely related to stress. She feels that she is extremely stressed out. Xanax given here. Patient doing better at xanax. She is stable for discharge. She was instructed to f/u with her Cardiologist tomorrow.  Evelina Bucy, MD 11/18/14 [phone]

## 2014-12-15 ENCOUNTER — Encounter (HOSPITAL_COMMUNITY): Payer: Self-pay | Admitting: *Deleted

## 2014-12-15 ENCOUNTER — Emergency Department (HOSPITAL_COMMUNITY)
Admission: EM | Admit: 2014-12-15 | Discharge: 2014-12-15 | Disposition: A | Discharge status: Home / Self Care | Payer: Medicare Other | Attending: Emergency Medicine | Admitting: Emergency Medicine

## 2014-12-15 DIAGNOSIS — J45909 Unspecified asthma, uncomplicated: Secondary | ICD-10-CM | POA: Diagnosis not present

## 2014-12-15 DIAGNOSIS — M1990 Unspecified osteoarthritis, unspecified site: Secondary | ICD-10-CM | POA: Insufficient documentation

## 2014-12-15 DIAGNOSIS — Z7982 Long term (current) use of aspirin: Secondary | ICD-10-CM | POA: Insufficient documentation

## 2014-12-15 DIAGNOSIS — Z8719 Personal history of other diseases of the digestive system: Secondary | ICD-10-CM | POA: Insufficient documentation

## 2014-12-15 DIAGNOSIS — Z8739 Personal history of other diseases of the musculoskeletal system and connective tissue: Secondary | ICD-10-CM | POA: Diagnosis not present

## 2014-12-15 DIAGNOSIS — L259 Unspecified contact dermatitis, unspecified cause: Secondary | ICD-10-CM

## 2014-12-15 DIAGNOSIS — Z79899 Other long term (current) drug therapy: Secondary | ICD-10-CM | POA: Insufficient documentation

## 2014-12-15 DIAGNOSIS — Z8673 Personal history of transient ischemic attack (TIA), and cerebral infarction without residual deficits: Secondary | ICD-10-CM | POA: Diagnosis not present

## 2014-12-15 DIAGNOSIS — I10 Essential (primary) hypertension: Secondary | ICD-10-CM | POA: Diagnosis not present

## 2014-12-15 DIAGNOSIS — F419 Anxiety disorder, unspecified: Secondary | ICD-10-CM | POA: Insufficient documentation

## 2014-12-15 DIAGNOSIS — R21 Rash and other nonspecific skin eruption: Secondary | ICD-10-CM | POA: Diagnosis present

## 2014-12-15 MED ORDER — PREDNISONE 20 MG PO TABS: 40.0000 mg | ORAL_TABLET | Freq: Every day | ORAL | Status: DC

## 2014-12-15 NOTE — ED Provider Notes (Addendum)
CSN: [id number]     Arrival date & time 12/15/14  1432 History   First MD Initiated Contact with Patient 12/15/14 1754     No chief complaint on file.    (Consider location/radiation/quality/duration/timing/severity/associated sxs/prior Treatment) Patient is a 64 y.o. female presenting with rash. The history is provided by the patient.  Rash Location:  Full body Quality: itchiness and redness   Severity:  Moderate Onset quality:  Gradual Duration:  2 weeks Timing:  Constant Progression:  Waxing and waning Chronicity:  New Context: not animal contact, not chemical exposure, not eggs, not exposure to similar rash, not food, not hot tub use, not insect bite/sting, not medications, not new detergent/soap, not plant contact, not pollen, not pregnancy and not sick contacts   Context comment:  Patient's grandson recently died and she has been under an immense amount of stress. The rash was there prior but it seems to be worse in times of stress Relieved by:  Antihistamines Exacerbated by: stress. Ineffective treatments:  None tried Associated symptoms: throat swelling   Associated symptoms: no abdominal pain, no hoarse voice, no joint pain, no nausea, no shortness of breath, no tongue swelling, no URI and not wheezing   Associated symptoms comment:  Intermittent episodes of feeling like her throat was swelling and some congestion in her chest but none currently that is usually when the rash is at its peak   Past Medical History  Diagnosis Date  . PSVT (paroxysmal supraventricular tachycardia) ([place])   . Hiatal hernia   . Hypertension   . Anxiety   . Asthma   . GERD (gastroesophageal reflux disease)   . Arthritis     SPINE  . TIA (transient ischemic attack)   . SVT (supraventricular tachycardia) [facility])    Past Surgical History  Procedure Laterality Date  . Cardiac electrophysiology mapping and ablation  2011  . Nissan fundoplication  [id number]  . Foot neuroma surgery    . Nasal septum  surgery    . Tee without cardioversion  02/15/2012    Procedure: TRANSESOPHAGEAL ECHOCARDIOGRAM (TEE);  Surgeon: Laverda Page, MD;  Location: [facility] ENDOSCOPY;  Service: Cardiovascular;  Laterality: N/A;  . Abdominal hysterectomy  1996    BSO   Family History  Problem Relation Age of Onset  . Heart disease Mother 30    Heart failure >> death at age 96  . Dementia Mother   . Hypertension Mother   . Cancer Mother     Breast and colon  . Breast cancer Mother 38  . Heart disease Father 61    MI  . COPD Father   . Heart disease Brother   . Mental illness Brother   . Cancer Brother 61    Lung and bone  . Cancer Brother     Lung and bone  . Cancer Brother     Colon   Social History  Substance Use Topics  . Smoking status: Never Smoker   . Smokeless tobacco: Never Used  . Alcohol Use: Yes     Comment: RARE   OB History    Gravida Para Term Preterm AB TAB SAB Ectopic Multiple Living   4 4  1      3      Review of Systems  HENT: Negative for hoarse voice.   Respiratory: Negative for shortness of breath and wheezing.   Gastrointestinal: Negative for nausea and abdominal pain.  Musculoskeletal: Negative for arthralgias.  Skin: Positive for rash.  All other systems reviewed  and are negative.     Allergies  Review of patient's allergies indicates no known allergies.  Home Medications   Prior to Admission medications   Medication Sig Start Date End Date Taking? Authorizing Provider  albuterol (PROVENTIL HFA;VENTOLIN HFA) 108 (90 BASE) MCG/ACT inhaler Inhale 2 puffs into the lungs every 4 (four) hours as needed. Shortness of breath/wheezing 03/15/14   Barton Fanny, MD  ALPRAZolam Duanne Moron) 0.25 MG tablet Tale 1/2 to 1 tablet at bedtime as needed for sleep. Patient not taking: Reported on 04/08/2014 04/04/14   Barton Fanny, MD  aspirin 81 MG chewable tablet Chew 1 tablet (81 mg total) by mouth daily. 12/22/11   Bryan R Hess, DO  fluticasone (FLONASE) 50 MCG/ACT  nasal spray Place 1 spray into both nostrils daily. 10/18/14   Jenise V Bacon Menshew, PA-C  mirabegron ER (MYRBETRIQ) 25 MG TB24 tablet Take 1 tablet (25 mg total) by mouth daily. Patient not taking: Reported on 03/27/2014 03/26/14   Terrance Mass, MD  NONFORMULARY OR COMPOUNDED ITEM Estradiol 0.02 % 52ml prefilled applicator Sig: apply twice a week 03/26/14   Terrance Mass, MD   BP 129/68 mmHg  Pulse 79  Temp(Src) 98.5 F (36.9 C) (Oral)  Resp 18  Ht 5\' 1"  (1.549 m)  Wt 165 lb (74.844 kg)  BMI 31.19 kg/m2  SpO2 96% Physical Exam  Constitutional: She is oriented to person, place, and time. She appears well-developed and well-nourished. No distress.  HENT:  Head: Normocephalic and atraumatic.  No tongue or throat swelling  Eyes: EOM are normal. Pupils are equal, round, and reactive to light.  Cardiovascular: Normal rate, regular rhythm, normal heart sounds and intact distal pulses.  Exam reveals no friction rub.   No murmur heard. Pulmonary/Chest: Effort normal and breath sounds normal. She has no wheezes. She has no rales.  Musculoskeletal: Normal range of motion. She exhibits no tenderness.  No edema  Neurological: She is alert and oriented to person, place, and time. No cranial nerve deficit.  Skin: Skin is warm and dry. Rash noted. Rash is maculopapular.     Psychiatric: She has a normal mood and affect. Her behavior is normal.  Nursing note and vitals reviewed.   ED Course  Procedures (including critical care time) Labs Review Labs Reviewed - No data to display  Imaging Review No results found. I have personally reviewed and evaluated these images and lab results as part of my medical decision-making.   EKG Interpretation None      MDM   Final diagnoses:  Contact dermatitis    64 year old female presenting today with a rash consistent with a contact dermatitis which could possibly be exacerbated by stress. She is currently having no respiratory symptoms but  states sometimes when the rashes that she finds are debrided. She has no mouth or pharyngeal swelling at this time. Will start prednisone and encouraged patient to continue Benadryl which has been slightly helpful when she takes it.    Blanchie Dessert, MD 12/15/14 [id number]  Blanchie Dessert, MD 12/15/14 [id number]

## 2014-12-15 NOTE — Discharge Instructions (Signed)
Contact Dermatitis Dermatitis is redness, soreness, and swelling (inflammation) of the skin. Contact dermatitis is a reaction to certain substances that touch the skin. There are two types of contact dermatitis:   Irritant contact dermatitis. This type is caused by something that irritates your skin, such as dry hands from washing them too much. This type does not require previous exposure to the substance for a reaction to occur. This type is more common.  Allergic contact dermatitis. This type is caused by a substance that you are allergic to, such as a nickel allergy or poison ivy. This type only occurs if you have been exposed to the substance (allergen) before. Upon a repeat exposure, your body reacts to the substance. This type is less common. CAUSES  Many different substances can cause contact dermatitis. Irritant contact dermatitis is most commonly caused by exposure to:   Makeup.   Soaps.   Detergents.   Bleaches.   Acids.   Metal salts, such as nickel.  Allergic contact dermatitis is most commonly caused by exposure to:   Poisonous plants.   Chemicals.   Jewelry.   Latex.   Medicines.   Preservatives in products, such as clothing.  RISK FACTORS This condition is more likely to develop in:   People who have jobs that expose them to irritants or allergens.  People who have certain medical conditions, such as asthma or eczema.  SYMPTOMS  Symptoms of this condition may occur anywhere on your body where the irritant has touched you or is touched by you. Symptoms include:  Dryness or flaking.   Redness.   Cracks.   Itching.   Pain or a burning feeling.   Blisters.  Drainage of small amounts of blood or clear fluid from skin cracks. With allergic contact dermatitis, there may also be swelling in areas such as the eyelids, mouth, or genitals.  DIAGNOSIS  This condition is diagnosed with a medical history and physical exam. A patch skin test  may be performed to help determine the cause. If the condition is related to your job, you may need to see an occupational medicine specialist. TREATMENT Treatment for this condition includes figuring out what caused the reaction and protecting your skin from further contact. Treatment may also include:   Steroid creams or ointments. Oral steroid medicines may be needed in more severe cases.  Antibiotics or antibacterial ointments, if a skin infection is present.  Antihistamine lotion or an antihistamine taken by mouth to ease itching.  A bandage (dressing). HOME CARE INSTRUCTIONS Skin Care  Moisturize your skin as needed.   Apply cool compresses to the affected areas.  Try taking a bath with:  Epsom salts. Follow the instructions on the packaging. You can get these at your local pharmacy or grocery store.  Baking soda. Pour a small amount into the bath as directed by your health care provider.  Colloidal oatmeal. Follow the instructions on the packaging. You can get this at your local pharmacy or grocery store.  Try applying baking soda paste to your skin. Stir water into baking soda until it reaches a paste-like consistency.  Do not scratch your skin.  Bathe less frequently, such as every other day.  Bathe in lukewarm water. Avoid using hot water. Medicines  Take or apply over-the-counter and prescription medicines only as told by your health care provider.   If you were prescribed an antibiotic medicine, take or apply your antibiotic as told by your health care provider. Do not stop using the   antibiotic even if your condition starts to improve. General Instructions  Keep all follow-up visits as told by your health care provider. This is important.  Avoid the substance that caused your reaction. If you do not know what caused it, keep a journal to try to track what caused it. Write down:  What you eat.  What cosmetic products you use.  What you drink.  What  you wear in the affected area. This includes jewelry.  If you were given a dressing, take care of it as told by your health care provider. This includes when to change and remove it. SEEK MEDICAL CARE IF:   Your condition does not improve with treatment.  Your condition gets worse.  You have signs of infection such as swelling, tenderness, redness, soreness, or warmth in the affected area.  You have a fever.  You have new symptoms. SEEK IMMEDIATE MEDICAL CARE IF:   You have a severe headache, neck pain, or neck stiffness.  You vomit.  You feel very sleepy.  You notice red streaks coming from the affected area.  Your bone or joint underneath the affected area becomes painful after the skin has healed.  The affected area turns darker.  You have difficulty breathing.   This information is not intended to replace advice given to you by your health care provider. Make sure you discuss any questions you have with your health care provider.   Document Released: 01/02/2000 Document Revised: 09/25/2014 Document Reviewed: 05/22/2014 Elsevier Interactive Patient Education 2016 [employer]  

## 2014-12-15 NOTE — ED Notes (Signed)
Pt reports hives onset x 2 wks ago, pt states, "It is worse with stress and yesterday was the worst. I had to bury my grandson. Right now I have a rash but it is not as bad. When it starts I feel short of breath. I feel like pins are sticking in me everywhere I start to break out." A&O x4, follows commands, speaks in complete, pt has rash to R upper abd, bil forearms, posterior neck & upper back, all skin intact, pt c/o itching at rash sites

## 2014-12-15 NOTE — ED Notes (Signed)
Pt came to nurse first and states she is leaving.  I Cabin crew) explained to pt that she is getting ready to be seen and encouraged her to stay for eval.  Pt states she will wait.

## 2014-12-15 NOTE — ED Notes (Signed)
Pt denies current SOB

## 2015-03-16 ENCOUNTER — Encounter (HOSPITAL_COMMUNITY): Payer: Self-pay | Admitting: Nurse Practitioner

## 2015-03-16 ENCOUNTER — Emergency Department (HOSPITAL_COMMUNITY): Payer: Medicare Other

## 2015-03-16 ENCOUNTER — Inpatient Hospital Stay (HOSPITAL_COMMUNITY)
Admission: EM | Admit: 2015-03-16 | Discharge: 2015-03-19 | DRG: 439 | Disposition: A | Discharge status: Home / Self Care | Payer: Medicare Other | Attending: Internal Medicine | Admitting: Internal Medicine

## 2015-03-16 DIAGNOSIS — K859 Acute pancreatitis without necrosis or infection, unspecified: Secondary | ICD-10-CM

## 2015-03-16 DIAGNOSIS — F419 Anxiety disorder, unspecified: Secondary | ICD-10-CM | POA: Diagnosis present

## 2015-03-16 DIAGNOSIS — Z7951 Long term (current) use of inhaled steroids: Secondary | ICD-10-CM

## 2015-03-16 DIAGNOSIS — Z8673 Personal history of transient ischemic attack (TIA), and cerebral infarction without residual deficits: Secondary | ICD-10-CM

## 2015-03-16 DIAGNOSIS — Z6829 Body mass index (BMI) 29.0-29.9, adult: Secondary | ICD-10-CM | POA: Diagnosis not present

## 2015-03-16 DIAGNOSIS — I471 Supraventricular tachycardia: Secondary | ICD-10-CM | POA: Diagnosis present

## 2015-03-16 DIAGNOSIS — R059 Cough, unspecified: Secondary | ICD-10-CM

## 2015-03-16 DIAGNOSIS — J45909 Unspecified asthma, uncomplicated: Secondary | ICD-10-CM | POA: Diagnosis present

## 2015-03-16 DIAGNOSIS — E669 Obesity, unspecified: Secondary | ICD-10-CM | POA: Diagnosis present

## 2015-03-16 DIAGNOSIS — Z7952 Long term (current) use of systemic steroids: Secondary | ICD-10-CM | POA: Diagnosis not present

## 2015-03-16 DIAGNOSIS — F17200 Nicotine dependence, unspecified, uncomplicated: Secondary | ICD-10-CM | POA: Diagnosis present

## 2015-03-16 DIAGNOSIS — K5900 Constipation, unspecified: Secondary | ICD-10-CM | POA: Diagnosis present

## 2015-03-16 DIAGNOSIS — R05 Cough: Secondary | ICD-10-CM | POA: Diagnosis not present

## 2015-03-16 DIAGNOSIS — K863 Pseudocyst of pancreas: Secondary | ICD-10-CM | POA: Diagnosis present

## 2015-03-16 DIAGNOSIS — R1013 Epigastric pain: Secondary | ICD-10-CM | POA: Diagnosis not present

## 2015-03-16 DIAGNOSIS — K219 Gastro-esophageal reflux disease without esophagitis: Secondary | ICD-10-CM | POA: Diagnosis present

## 2015-03-16 DIAGNOSIS — Z7982 Long term (current) use of aspirin: Secondary | ICD-10-CM | POA: Diagnosis not present

## 2015-03-16 DIAGNOSIS — K8500 Idiopathic acute pancreatitis without necrosis or infection: Principal | ICD-10-CM | POA: Diagnosis present

## 2015-03-16 DIAGNOSIS — I10 Essential (primary) hypertension: Secondary | ICD-10-CM | POA: Diagnosis present

## 2015-03-16 DIAGNOSIS — Z8719 Personal history of other diseases of the digestive system: Secondary | ICD-10-CM

## 2015-03-16 HISTORY — DX: Personal history of other diseases of the digestive system: Z87.19

## 2015-03-16 LAB — COMPREHENSIVE METABOLIC PANEL
ALK PHOS: 67 U/L (ref 38–126)
ALT: 15 U/L (ref 14–54)
ANION GAP: 11 (ref 5–15)
AST: 19 U/L (ref 15–41)
Albumin: 3.6 g/dL (ref 3.5–5.0)
BILIRUBIN TOTAL: 0.6 mg/dL (ref 0.3–1.2)
BUN: 7 mg/dL (ref 6–20)
CO2: 25 mmol/L (ref 22–32)
Calcium: 9.4 mg/dL (ref 8.9–10.3)
Chloride: 104 mmol/L (ref 101–111)
Creatinine, Ser: 0.78 mg/dL (ref 0.44–1.00)
Glucose, Bld: 125 mg/dL — ABNORMAL HIGH (ref 65–99)
POTASSIUM: 4.5 mmol/L (ref 3.5–5.1)
Sodium: 140 mmol/L (ref 135–145)
TOTAL PROTEIN: 6.8 g/dL (ref 6.5–8.1)

## 2015-03-16 LAB — URINALYSIS, ROUTINE W REFLEX MICROSCOPIC
Bilirubin Urine: NEGATIVE
Glucose, UA: NEGATIVE mg/dL
Hgb urine dipstick: NEGATIVE
KETONES UR: NEGATIVE mg/dL
LEUKOCYTES UA: NEGATIVE
NITRITE: NEGATIVE
PH: 6 (ref 5.0–8.0)
PROTEIN: NEGATIVE mg/dL
Specific Gravity, Urine: 1.022 (ref 1.005–1.030)

## 2015-03-16 LAB — CBC
HEMATOCRIT: 45.2 % (ref 36.0–46.0)
Hemoglobin: 15.1 g/dL — ABNORMAL HIGH (ref 12.0–15.0)
MCH: 31.5 pg (ref 26.0–34.0)
MCHC: 33.4 g/dL (ref 30.0–36.0)
MCV: 94.2 fL (ref 78.0–100.0)
Platelets: 260 10*3/uL (ref 150–400)
RBC: 4.8 MIL/uL (ref 3.87–5.11)
RDW: 12.8 % (ref 11.5–15.5)
WBC: 14.3 10*3/uL — AB (ref 4.0–10.5)

## 2015-03-16 LAB — LIPID PANEL
CHOL/HDL RATIO: 2.9 ratio
CHOLESTEROL: 176 mg/dL (ref 0–200)
HDL: 61 mg/dL (ref >40)
LDL Cholesterol: 97 mg/dL (ref 0–99)
Triglycerides: 92 mg/dL (ref <150)
VLDL: 18 mg/dL (ref 0–40)

## 2015-03-16 LAB — I-STAT TROPONIN, ED: Troponin i, poc: 0 ng/mL (ref 0.00–0.08)

## 2015-03-16 LAB — LIPASE, BLOOD: Lipase: 39 U/L (ref 11–51)

## 2015-03-16 MED ORDER — MORPHINE SULFATE (PF) 4 MG/ML IV SOLN
4.0000 mg | INTRAVENOUS | Status: DC | PRN
Start: 2015-03-16 — End: 2015-03-16
  Administered 2015-03-16: 4 mg via INTRAVENOUS
  Filled 2015-03-16: qty 1

## 2015-03-16 MED ORDER — ONDANSETRON HCL 4 MG/2ML IJ SOLN
4.0000 mg | Freq: Four times a day (QID) | INTRAMUSCULAR | Status: DC | PRN
  Administered 2015-03-17: 4 mg via INTRAVENOUS
  Filled 2015-03-16: qty 2

## 2015-03-16 MED ORDER — IBUPROFEN 400 MG PO TABS
400.0000 mg | ORAL_TABLET | Freq: Four times a day (QID) | ORAL | Status: DC | PRN
  Administered 2015-03-17: 400 mg via ORAL
  Filled 2015-03-16: qty 1

## 2015-03-16 MED ORDER — ONDANSETRON HCL 4 MG PO TABS
4.0000 mg | ORAL_TABLET | Freq: Four times a day (QID) | ORAL | Status: DC | PRN
  Administered 2015-03-18: 4 mg via ORAL
  Filled 2015-03-16: qty 1

## 2015-03-16 MED ORDER — MORPHINE SULFATE (PF) 2 MG/ML IV SOLN
2.0000 mg | INTRAVENOUS | Status: DC | PRN
  Administered 2015-03-16: 4 mg via INTRAVENOUS
  Administered 2015-03-17 (×3): 2 mg via INTRAVENOUS
  Filled 2015-03-16 (×3): qty 1
  Filled 2015-03-16: qty 2

## 2015-03-16 MED ORDER — ASPIRIN 81 MG PO CHEW
81.0000 mg | CHEWABLE_TABLET | Freq: Every day | ORAL | Status: DC
  Administered 2015-03-16 – 2015-03-19 (×4): 81 mg via ORAL
  Filled 2015-03-16 (×4): qty 1

## 2015-03-16 MED ORDER — FAMOTIDINE 10 MG PO TABS
10.0000 mg | ORAL_TABLET | Freq: Two times a day (BID) | ORAL | Status: DC
  Administered 2015-03-16 – 2015-03-19 (×6): 10 mg via ORAL
  Filled 2015-03-16 (×6): qty 1

## 2015-03-16 MED ORDER — SODIUM CHLORIDE 0.9 % IV SOLN
INTRAVENOUS | Status: DC
  Administered 2015-03-16 – 2015-03-17 (×2): 1 mL via INTRAVENOUS
  Administered 2015-03-18 – 2015-03-19 (×4): via INTRAVENOUS

## 2015-03-16 MED ORDER — ALBUTEROL SULFATE HFA 108 (90 BASE) MCG/ACT IN AERS: 2.0000 | INHALATION_SPRAY | RESPIRATORY_TRACT | Status: DC | PRN

## 2015-03-16 MED ORDER — HYDROMORPHONE HCL 1 MG/ML IJ SOLN: 1.0000 mg | INTRAMUSCULAR | Status: DC | PRN

## 2015-03-16 MED ORDER — ONDANSETRON HCL 4 MG/2ML IJ SOLN
4.0000 mg | Freq: Once | INTRAMUSCULAR | Status: AC
  Administered 2015-03-16: 4 mg via INTRAVENOUS
  Filled 2015-03-16: qty 2

## 2015-03-16 MED ORDER — ALBUTEROL SULFATE (2.5 MG/3ML) 0.083% IN NEBU: 2.5000 mg | INHALATION_SOLUTION | RESPIRATORY_TRACT | Status: DC | PRN

## 2015-03-16 MED ORDER — ENOXAPARIN SODIUM 40 MG/0.4ML ~~LOC~~ SOLN
40.0000 mg | SUBCUTANEOUS | Status: DC
  Administered 2015-03-16 – 2015-03-18 (×3): 40 mg via SUBCUTANEOUS
  Filled 2015-03-16 (×3): qty 0.4

## 2015-03-16 MED ORDER — ONDANSETRON HCL 4 MG/2ML IJ SOLN: 4.0000 mg | Freq: Three times a day (TID) | INTRAMUSCULAR | Status: DC | PRN

## 2015-03-16 MED ORDER — IOHEXOL 300 MG/ML  SOLN
100.0000 mL | Freq: Once | INTRAMUSCULAR | Status: AC | PRN
  Administered 2015-03-16: 100 mL via INTRAVENOUS

## 2015-03-16 NOTE — ED Provider Notes (Signed)
CSN: [id number]     Arrival date & time 03/16/15  1400 History   First MD Initiated Contact with Patient 03/16/15 1609     Chief Complaint  Patient presents with  . Abdominal Pain     (Consider location/radiation/quality/duration/timing/severity/associated sxs/prior Treatment) The history is provided by the patient and medical records. No language interpreter was used.     AMENIA DEVAULT is a 65 y.o. female  with a hx of PSVT, hiatal hernia (repaired in 2007), HTN, anxiety, GERD, TIA, PUD presents to the Emergency Department complaining of gradual, persistent, progressively worsening epigastric abdominal pain onset 4 days. Pt reports that the pain moved from the epigastrium into the lower abd approx 1 hour ago.  Pt reports the pain is squeezing and "takes my breath."  Pt reports taking Zantac and Xanax without relief.  Pt reports associated nausea with emesis x1.  Pt reports emesis was NBNB and only a small amount last night.  Pt denies fever, chills, headache, neck pain, chest pain, SOB, diarrhea, weakness, dizziness, syncope, dysuria, hematuria.  Last BM was yesterday and required an enema to have a BM.  Prior the yesterday, pt's last BM was Tues and normal.   Pt reports constipation is unusual for her.  Pt has had Nissan fundoplication, cardiac ablation and abd hysterectomy.    Pt daughter at bedside reports lots of stress at home.  She also reports right upper back pain that began after lifting heavy boxes in her garage on Thurs.  She reports movement and coughing exacerbates her pain.    [facility]   Past Medical History  Diagnosis Date  . PSVT (paroxysmal supraventricular tachycardia) ([place])   . Hiatal hernia   . Hypertension   . Anxiety   . Asthma   . GERD (gastroesophageal reflux disease)   . Arthritis     SPINE  . TIA (transient ischemic attack)   . SVT (supraventricular tachycardia) [facility])    Past Surgical History  Procedure Laterality Date  .  Cardiac electrophysiology mapping and ablation  2011  . Nissan fundoplication  [id number]  . Foot neuroma surgery    . Nasal septum surgery    . Tee without cardioversion  02/15/2012    Procedure: TRANSESOPHAGEAL ECHOCARDIOGRAM (TEE);  Surgeon: Laverda Page, MD;  Location: [facility] ENDOSCOPY;  Service: Cardiovascular;  Laterality: N/A;  . Abdominal hysterectomy  1996    BSO   Family History  Problem Relation Age of Onset  . Heart disease Mother 23    Heart failure >> death at age 21  . Dementia Mother   . Hypertension Mother   . Cancer Mother     Breast and colon  . Breast cancer Mother 1  . Heart disease Father 60    MI  . COPD Father   . Heart disease Brother   . Mental illness Brother   . Cancer Brother 65    Lung and bone  . Cancer Brother     Lung and bone  . Cancer Brother     Colon   Social History  Substance Use Topics  . Smoking status: Never Smoker   . Smokeless tobacco: Never Used  . Alcohol Use: Yes     Comment: RARE   OB History    Gravida Para Term Preterm AB TAB SAB Ectopic Multiple Living   4 4  1      3      Review of Systems  Constitutional: Negative for fever,  diaphoresis, appetite change, fatigue and unexpected weight change.  HENT: Negative for mouth sores.   Eyes: Negative for visual disturbance.  Respiratory: Negative for cough, chest tightness, shortness of breath and wheezing.   Cardiovascular: Negative for chest pain.  Gastrointestinal: Positive for nausea, vomiting, abdominal pain and constipation. Negative for diarrhea.  Endocrine: Negative for polydipsia, polyphagia and polyuria.  Genitourinary: Negative for dysuria, urgency, frequency and hematuria.  Musculoskeletal: Positive for back pain. Negative for neck stiffness.  Skin: Negative for rash.  Allergic/Immunologic: Negative for immunocompromised state.  Neurological: Negative for syncope, light-headedness and headaches.  Hematological: Does not bruise/bleed easily.   Psychiatric/Behavioral: Negative for sleep disturbance. The patient is not nervous/anxious.       Allergies  Review of patient's allergies indicates no known allergies.  Home Medications   Prior to Admission medications   Medication Sig Start Date End Date Taking? Authorizing Provider  albuterol (PROVENTIL HFA;VENTOLIN HFA) 108 (90 BASE) MCG/ACT inhaler Inhale 2 puffs into the lungs every 4 (four) hours as needed. Shortness of breath/wheezing 03/15/14  Yes Barton Fanny, MD  aspirin 81 MG chewable tablet Chew 1 tablet (81 mg total) by mouth daily. 12/22/11  Yes Bryan R Hess, DO  ibuprofen (ADVIL,MOTRIN) 200 MG tablet Take 400 mg by mouth every 6 (six) hours as needed for moderate pain.   Yes Historical Provider, MD  NONFORMULARY OR COMPOUNDED ITEM Estradiol 0.02 % 39ml prefilled applicator Sig: apply twice a week 03/26/14  Yes Terrance Mass, MD  ranitidine (ZANTAC) 75 MG tablet Take 75 mg by mouth 2 (two) times daily as needed for heartburn.    Yes Historical Provider, MD  ALPRAZolam Duanne Moron) 0.25 MG tablet Tale 1/2 to 1 tablet at bedtime as needed for sleep. Patient not taking: Reported on 04/08/2014 04/04/14   Barton Fanny, MD  fluticasone [facility]) 50 MCG/ACT nasal spray Place 1 spray into both nostrils daily. 10/18/14   Jenise V Bacon Menshew, PA-C  mirabegron ER (MYRBETRIQ) 25 MG TB24 tablet Take 1 tablet (25 mg total) by mouth daily. Patient not taking: Reported on 03/27/2014 03/26/14   Terrance Mass, MD  predniSONE (DELTASONE) 20 MG tablet Take 2 tablets (40 mg total) by mouth daily. 12/15/14   Blanchie Dessert, MD   BP 167/84 mmHg  Pulse 77  Temp(Src) 98.1 F (36.7 C) (Oral)  Resp 20  SpO2 100% Physical Exam  Constitutional: She appears well-developed and well-nourished. No distress.  Awake, alert, nontoxic appearance  HENT:  Head: Normocephalic and atraumatic.  Mouth/Throat: Oropharynx is clear and moist. No oropharyngeal exudate.  Eyes: Conjunctivae are normal.  No scleral icterus.  Neck: Normal range of motion. Neck supple.  Cardiovascular: Normal rate, regular rhythm, normal heart sounds and intact distal pulses.   Pulmonary/Chest: Effort normal and breath sounds normal. No respiratory distress. She has no wheezes.  Equal chest expansion  Abdominal: Soft. Bowel sounds are normal. She exhibits no distension and no mass. There is tenderness in the right upper quadrant, epigastric area and left upper quadrant. There is guarding. There is no rebound and no CVA tenderness.  Musculoskeletal: Normal range of motion. She exhibits no edema.  TTP of the right upper paraspinal muscles  Neurological: She is alert.  Speech is clear and goal oriented Moves extremities without ataxia  Skin: Skin is warm and dry. She is not diaphoretic.  Psychiatric: She has a normal mood and affect.  Nursing note and vitals reviewed.   ED Course  Procedures (including critical care time) Labs Review  Labs Reviewed  COMPREHENSIVE METABOLIC PANEL - Abnormal; Notable for the following:    Glucose, Bld 125 (*)    All other components within normal limits  CBC - Abnormal; Notable for the following:    WBC 14.3 (*)    Hemoglobin 15.1 (*)    All other components within normal limits  URINALYSIS, ROUTINE W REFLEX MICROSCOPIC (NOT AT [facility])  LIPASE, BLOOD  I-STAT TROPOININ, ED    Imaging Review Dg Ribs Unilateral W/chest Right  03/16/2015  CLINICAL DATA:  Fall 4 days ago. Left rib and chest pain. Initial encounter. EXAM: RIGHT RIBS AND CHEST - 3+ VIEW COMPARISON:  11/18/2014 FINDINGS: No fracture or other bone lesions are seen involving the ribs. There is no evidence of pneumothorax or pleural effusion. Both lungs are clear. Heart size and mediastinal contours are within normal limits. IMPRESSION: Negative. Electronically Signed   By: Earle Gell M.D.   On: 03/16/2015 17:14   Ct Abdomen Pelvis W Contrast  03/16/2015  CLINICAL DATA:  Diffuse abdominal pain for 4 days. Nausea.  Previous hiatal hernia repair and hysterectomy. EXAM: CT ABDOMEN AND PELVIS WITH CONTRAST TECHNIQUE: Multidetector CT imaging of the abdomen and pelvis was performed using the standard protocol following bolus administration of intravenous contrast. CONTRAST:  166mL OMNIPAQUE IOHEXOL 300 MG/ML  SOLN COMPARISON:  None. FINDINGS: Lower chest: No acute findings. Surgical clips noted from previous hiatal hernia repair. Hepatobiliary: No masses or other significant abnormality. Gallbladder is unremarkable. No evidence of biliary ductal dilatation. Pancreas: The pancreatic head shows mild swelling and peripancreatic soft tissue stranding, suspicious for mild acute pancreatitis. No evidence of pancreatic ductal dilatation. A 1.4 cm simple appearing cyst is seen in the pancreatic uncinate process, likely representing a tiny pseudocyst. No definite solid pancreatic mass or pancreatic necrosis demonstrated. Spleen: Within normal limits in size and appearance. Adrenals/Urinary Tract: No masses identified. No evidence of hydronephrosis. Several tiny renal cysts noted bilaterally. Stomach/Bowel: No evidence of obstruction, inflammatory process, or abnormal fluid collections. Normal appendix visualized. Vascular/Lymphatic: No pathologically enlarged lymph nodes. No evidence of abdominal aortic aneurysm. Reproductive: Prior hysterectomy noted. Adnexal regions are unremarkable in appearance. Other: None. Musculoskeletal:  No suspicious bone lesions identified. IMPRESSION: Findings consistent with mild acute pancreatitis involving the pancreatic head, with probable tiny 1.4 cm pseudocyst in the uncinate process. Continued followup by CT recommended in 2-3 months. Electronically Signed   By: Earle Gell M.D.   On: 03/16/2015 18:47   I have personally reviewed and evaluated these images and lab results as part of my medical decision-making.   EKG Interpretation   Date/Time:  Sunday March 16 2015 17:34:59 EST Ventricular  Rate:  84 PR Interval:  177 QRS Duration: 78 QT Interval:  372 QTC Calculation: 440 R Axis:   49 Text Interpretation:  Sinus rhythm Low voltage, precordial leads No  significant change since last tracing Confirmed by Winfred Leeds  MD, SAM  [phone]) on 03/16/2015 5:37:57 PM      MDM   Final diagnoses:  Acute pancreatitis, unspecified pancreatitis type  Epigastric abdominal pain   [name] sense with epigastric abdominal pain intermittently radiating into her lower abdomen. 1 episode of vomiting.  No fever. Epigastric tenderness on exam. His CT scan shows a mild acute pancreatitis. Patient denies drinking regularly, specifically no alcohol in the last week. She denies other drug usage. She is a smoker.  Reassuring including normal lipase and no elevation in AST, ALT. Doubt gallstone pancreatitis.  Will admit for bowel rest, fluid  hydration and pain control.  8:13 PM Discussed with [facility], Dr. Alcario Drought who will admit.    The patient was discussed with and seen by Dr. Winfred Leeds who agrees with the treatment plan.     Abigail Butts, PA-C 03/16/15 2014  Orlie Dakin, MD 03/16/15 2328

## 2015-03-16 NOTE — H&P (Signed)
[facility] History and Physical  Pamela Daniel [id number] DOB: 09/24/1950 DOA: 03/16/2015  Referring physician: EDP PCP: Kennon Portela, MD   Chief Complaint: Abdominal pain   HPI: Pamela Daniel is a 65 y.o. female who presents to the ED with 4 day history of gradual onset, persistent, progressively worsening, abdominal pain located in her epigastric area.  Pain is squeezing, zantac and xanax provide no relief.  Associated nausea and 1 episode of vomiting.  Normal LFTs, negative lipase, but CT abd/pelvis appears to show mild acute pancreatitis with a 1.4cm pseudocyst.  Review of Systems: Systems reviewed.  As above, otherwise negative  Past Medical History  Diagnosis Date  . PSVT (paroxysmal supraventricular tachycardia) ([place])   . Hiatal hernia   . Hypertension   . Anxiety   . Asthma   . GERD (gastroesophageal reflux disease)   . Arthritis     SPINE  . TIA (transient ischemic attack)   . SVT (supraventricular tachycardia) [facility])    Past Surgical History  Procedure Laterality Date  . Cardiac electrophysiology mapping and ablation  2011  . Nissan fundoplication  [id number]  . Foot neuroma surgery    . Nasal septum surgery    . Tee without cardioversion  02/15/2012    Procedure: TRANSESOPHAGEAL ECHOCARDIOGRAM (TEE);  Surgeon: Laverda Page, MD;  Location: [place];  Service: Cardiovascular;  Laterality: N/A;  . Abdominal hysterectomy  1996    BSO   Social History:  reports that she has never smoked. She has never used smokeless tobacco. She reports that she drinks alcohol. She reports that she does not use illicit drugs.  No Known Allergies  Family History  Problem Relation Age of Onset  . Heart disease Mother 45    Heart failure >> death at age 34  . Dementia Mother   . Hypertension Mother   . Cancer Mother     Breast and colon  . Breast cancer Mother 22  . Heart disease Father 41    MI  . COPD Father   . Heart disease Brother   . Mental  illness Brother   . Cancer Brother 51    Lung and bone  . Cancer Brother     Lung and bone  . Cancer Brother     Colon     Prior to Admission medications   Medication Sig Start Date End Date Taking? Authorizing Provider  albuterol (PROVENTIL HFA;VENTOLIN HFA) 108 (90 BASE) MCG/ACT inhaler Inhale 2 puffs into the lungs every 4 (four) hours as needed. Shortness of breath/wheezing 03/15/14  Yes Barton Fanny, MD  aspirin 81 MG chewable tablet Chew 1 tablet (81 mg total) by mouth daily. 12/22/11  Yes Bryan R Hess, DO  ibuprofen (ADVIL,MOTRIN) 200 MG tablet Take 400 mg by mouth every 6 (six) hours as needed for moderate pain.   Yes Historical Provider, MD  NONFORMULARY OR COMPOUNDED ITEM Estradiol 0.02 % 7ml prefilled applicator Sig: apply twice a week 03/26/14  Yes Terrance Mass, MD  ranitidine (ZANTAC) 75 MG tablet Take 75 mg by mouth 2 (two) times daily as needed for heartburn.    Yes Historical Provider, MD   Physical Exam: Filed Vitals:   03/16/15 1828 03/16/15 1900  BP:  167/84  Pulse: 86 77  Temp:    Resp: 15 20    BP 167/84 mmHg  Pulse 77  Temp(Src) 98.1 F (36.7 C) (Oral)  Resp 20  SpO2 100%  General Appearance:    Alert,  oriented, no distress, appears stated age  Head:    Normocephalic, atraumatic  Eyes:    PERRL, EOMI, sclera non-icteric        Nose:   Nares without drainage or epistaxis. Mucosa, turbinates normal  Throat:   Moist mucous membranes. Oropharynx without erythema or exudate.  Neck:   Supple. No carotid bruits.  No thyromegaly.  No lymphadenopathy.   Back:     No CVA tenderness, no spinal tenderness  Lungs:     Clear to auscultation bilaterally, without wheezes, rhonchi or rales  Chest wall:    No tenderness to palpitation  Heart:    Regular rate and rhythm without murmurs, gallops, rubs  Abdomen:     Soft, non-tender, nondistended, normal bowel sounds, no organomegaly  Genitalia:    deferred  Rectal:    deferred  Extremities:   No clubbing,  cyanosis or edema.  Pulses:   2+ and symmetric all extremities  Skin:   Skin color, texture, turgor normal, no rashes or lesions  Lymph nodes:   Cervical, supraclavicular, and axillary nodes normal  Neurologic:   CNII-XII intact. Normal strength, sensation and reflexes      throughout    Labs on Admission:  Basic Metabolic Panel:  Recent Labs Lab 03/16/15 1455  NA 140  K 4.5  CL 104  CO2 25  GLUCOSE 125*  BUN 7  CREATININE 0.78  CALCIUM 9.4   Liver Function Tests:  Recent Labs Lab 03/16/15 1455  AST 19  ALT 15  ALKPHOS 67  BILITOT 0.6  PROT 6.8  ALBUMIN 3.6    Recent Labs Lab 03/16/15 1749  LIPASE 39   No results for input(s): AMMONIA in the last 168 hours. CBC:  Recent Labs Lab 03/16/15 1455  WBC 14.3*  HGB 15.1*  HCT 45.2  MCV 94.2  PLT 260   Cardiac Enzymes: No results for input(s): CKTOTAL, CKMB, CKMBINDEX, TROPONINI in the last 168 hours.  BNP (last 3 results) No results for input(s): PROBNP in the last 8760 hours. CBG: No results for input(s): GLUCAP in the last 168 hours.  Radiological Exams on Admission: Dg Ribs Unilateral W/chest Right  03/16/2015  CLINICAL DATA:  Fall 4 days ago. Left rib and chest pain. Initial encounter. EXAM: RIGHT RIBS AND CHEST - 3+ VIEW COMPARISON:  11/18/2014 FINDINGS: No fracture or other bone lesions are seen involving the ribs. There is no evidence of pneumothorax or pleural effusion. Both lungs are clear. Heart size and mediastinal contours are within normal limits. IMPRESSION: Negative. Electronically Signed   By: Earle Gell M.D.   On: 03/16/2015 17:14   Ct Abdomen Pelvis W Contrast  03/16/2015  CLINICAL DATA:  Diffuse abdominal pain for 4 days. Nausea. Previous hiatal hernia repair and hysterectomy. EXAM: CT ABDOMEN AND PELVIS WITH CONTRAST TECHNIQUE: Multidetector CT imaging of the abdomen and pelvis was performed using the standard protocol following bolus administration of intravenous contrast. CONTRAST:   145mL OMNIPAQUE IOHEXOL 300 MG/ML  SOLN COMPARISON:  None. FINDINGS: Lower chest: No acute findings. Surgical clips noted from previous hiatal hernia repair. Hepatobiliary: No masses or other significant abnormality. Gallbladder is unremarkable. No evidence of biliary ductal dilatation. Pancreas: The pancreatic head shows mild swelling and peripancreatic soft tissue stranding, suspicious for mild acute pancreatitis. No evidence of pancreatic ductal dilatation. A 1.4 cm simple appearing cyst is seen in the pancreatic uncinate process, likely representing a tiny pseudocyst. No definite solid pancreatic mass or pancreatic necrosis demonstrated. Spleen: Within normal limits in  size and appearance. Adrenals/Urinary Tract: No masses identified. No evidence of hydronephrosis. Several tiny renal cysts noted bilaterally. Stomach/Bowel: No evidence of obstruction, inflammatory process, or abnormal fluid collections. Normal appendix visualized. Vascular/Lymphatic: No pathologically enlarged lymph nodes. No evidence of abdominal aortic aneurysm. Reproductive: Prior hysterectomy noted. Adnexal regions are unremarkable in appearance. Other: None. Musculoskeletal:  No suspicious bone lesions identified. IMPRESSION: Findings consistent with mild acute pancreatitis involving the pancreatic head, with probable tiny 1.4 cm pseudocyst in the uncinate process. Continued followup by CT recommended in 2-3 months. Electronically Signed   By: Earle Gell M.D.   On: 03/16/2015 18:47    EKG: Independently reviewed.  Assessment/Plan Active Problems:   Pancreatitis   1. Idiopathic acute pancreatitis - 1.  2. Regarding causes: 1. No EtOH use 2. no evidence for gallstone disease, bile duct non dilated on CT normal LFTs 3. Not on any meds that can cause acute pancreatitis 4. Will check a lipid profile, but triglycerides were only 102 in March of last year, and not significantly elevated in 2013 (last 2 profiles) 5. No scorpion  stings or recent travel at all 3. With apparent 1.4 cm pseudocyst 1. Likely needs follow up CT 4. IVF 5. Repeat CBC and CMP in AM 6. Nausea ctrl with zofran, pain ctrl with morphine  Code Status: Full  Family Communication: No family in room Disposition Plan: Admit to inpatient   Time spent: 70 min  GARDNER, JARED M. [facility] Pager [phone]  If 7AM-7PM, please contact the day team taking care of the patient [email or web address] Password [email or web address] 03/16/2015, 8:40 PM

## 2015-03-16 NOTE — ED Notes (Signed)
She c/o 2 day history of abd pain. The pain initially got a little better but then she worked in her garage and has been having worse abd pain and also back pain since. States the pain makes her nauseous. She did vomit once. She has felt constipated and had to use an enema yesterday to help her move her bowels.  She denies fevers, urinary changes.

## 2015-03-16 NOTE — ED Notes (Signed)
Attempted to call report

## 2015-03-16 NOTE — ED Notes (Signed)
Attempted to call report-no answer 

## 2015-03-16 NOTE — ED Provider Notes (Signed)
Place of epigastric pain radiating to lower abdomen onset 4 days ago she's had one episode of vomiting admits to slight nausea present. Other associated symptoms include diminished appetite She denies urinary symptoms treated self with an enema. She is also treated herself with ibuprofen and Zantac, without relief. No known fever. She also reports injuring her posterior right ribs in a twisting type injury while working in her home last week though right rib pain is improving On exam alert nontoxic lungs clear to auscultation abdomen obese, tender at epigastrium, no guarding rigidity or rebound  Orlie Dakin, MD 03/16/15 1654

## 2015-03-16 NOTE — ED Notes (Signed)
Pt is in radiology.

## 2015-03-17 ENCOUNTER — Inpatient Hospital Stay (HOSPITAL_COMMUNITY): Payer: Medicare Other

## 2015-03-17 ENCOUNTER — Encounter (HOSPITAL_COMMUNITY): Payer: Self-pay | Admitting: General Practice

## 2015-03-17 DIAGNOSIS — R1013 Epigastric pain: Secondary | ICD-10-CM

## 2015-03-17 DIAGNOSIS — K859 Acute pancreatitis without necrosis or infection, unspecified: Secondary | ICD-10-CM | POA: Insufficient documentation

## 2015-03-17 LAB — CBC
HCT: 41.5 % (ref 36.0–46.0)
Hemoglobin: 13.4 g/dL (ref 12.0–15.0)
MCH: 30.7 pg (ref 26.0–34.0)
MCHC: 32.3 g/dL (ref 30.0–36.0)
MCV: 95.2 fL (ref 78.0–100.0)
Platelets: 238 10*3/uL (ref 150–400)
RBC: 4.36 MIL/uL (ref 3.87–5.11)
RDW: 13.1 % (ref 11.5–15.5)
WBC: 15.4 10*3/uL — ABNORMAL HIGH (ref 4.0–10.5)

## 2015-03-17 LAB — COMPREHENSIVE METABOLIC PANEL
ALT: 12 U/L — ABNORMAL LOW (ref 14–54)
ANION GAP: 10 (ref 5–15)
AST: 17 U/L (ref 15–41)
Albumin: 3.1 g/dL — ABNORMAL LOW (ref 3.5–5.0)
Alkaline Phosphatase: 59 U/L (ref 38–126)
BILIRUBIN TOTAL: 1.1 mg/dL (ref 0.3–1.2)
BUN: 6 mg/dL (ref 6–20)
CO2: 26 mmol/L (ref 22–32)
Calcium: 8.5 mg/dL — ABNORMAL LOW (ref 8.9–10.3)
Chloride: 101 mmol/L (ref 101–111)
Creatinine, Ser: 0.8 mg/dL (ref 0.44–1.00)
GFR calc Af Amer: >60 mL/min (ref >60)
Glucose, Bld: 142 mg/dL — ABNORMAL HIGH (ref 65–99)
POTASSIUM: 4.1 mmol/L (ref 3.5–5.1)
SODIUM: 137 mmol/L (ref 135–145)
TOTAL PROTEIN: 6.1 g/dL — AB (ref 6.5–8.1)

## 2015-03-17 MED ORDER — SODIUM CHLORIDE 0.9 % IV SOLN
1.5000 g | Freq: Four times a day (QID) | INTRAVENOUS | Status: DC
  Administered 2015-03-17 – 2015-03-19 (×9): 1.5 g via INTRAVENOUS
  Filled 2015-03-17 (×13): qty 1.5

## 2015-03-17 MED ORDER — TRAMADOL HCL 50 MG PO TABS
50.0000 mg | ORAL_TABLET | Freq: Four times a day (QID) | ORAL | Status: DC | PRN
  Administered 2015-03-17 – 2015-03-18 (×3): 50 mg via ORAL
  Filled 2015-03-17 (×3): qty 1

## 2015-03-17 MED ORDER — PANTOPRAZOLE SODIUM 40 MG PO TBEC
40.0000 mg | DELAYED_RELEASE_TABLET | Freq: Every day | ORAL | Status: DC
  Administered 2015-03-17 – 2015-03-19 (×3): 40 mg via ORAL
  Filled 2015-03-17 (×3): qty 1

## 2015-03-17 MED ORDER — FLUTICASONE PROPIONATE 50 MCG/ACT NA SUSP
1.0000 | Freq: Every day | NASAL | Status: DC
  Administered 2015-03-17 – 2015-03-19 (×2): 1 via NASAL
  Filled 2015-03-17: qty 16

## 2015-03-17 NOTE — Progress Notes (Signed)
[facility] PROGRESS NOTE  Pamela Daniel [id number] DOB: 13-Dec-1950 DOA: 03/16/2015 PCP: Kennon Portela, MD  Length of stay: 1   Assessment/Plan: Active Problems:   Pancreatitis   Pancreatitis, acute   Epigastric abdominal pain  Brief summary  65 y.o. female with a hx of PSVT, hiatal hernia (repaired in 2007), HTN, anxiety, GERD, TIA, PUD presents to the Emergency Department complaining of gradual, persistent, progressively worsening epigastric abdominal pain onset 4 days. Patient has also been constipated which is unusual for her. Initially experienced epigastric pain that moved to her lower abdomen. Pt reports constipation which is unusual for her. Pt has had [employer] fundoplication, cardiac ablation and abd hysterectomy. CT scan shows a mild acute pancreatitis this admission.  Assessment and plan Idiopathic acute pancreatitis Gallbladder unremarkable on CT 1.4 cm cyst in the pancreatic uncinate process likely representing tiny pseudocyst Follow-up CT scan recommended in 2-3 months Lipid panel within normal limits Right upper quadrant ultrasound to further evaluate gallbladder, common bile duct Liver function within normal limits White count elevated at 15.4, UA negative Discontinue regular diet and change to clear liquid diet  Paroxysmal supraventricular tachycardia-continue to cycle cardiac enzymes  Hypertension-patient currently on no medication for this  Anxiety-stable  Asthma-patient recently completed a course of prednisone  Gastroesophageal reflux disease. Start patient on Protonix  TIA-patient is on a baby aspirin which will be continued      DVT prophylaxsis Lovenox  Code Status:      Code Status Orders     Full code    Start     Ordered   03/16/15 2035  Full code   Continuous     03/16/15 2039    Code Status History    Date Active Date Inactive Code Status Order ID Comments User Context   12/22/2011  3:46 AM 12/23/2011 12:45  AM Full Code [id number]  [name], RN Inpatient      Family Communication: Discussed in detail with the patient, all imaging results, lab results explained to the patient   Disposition Plan:   downgrade to clear liquid diet in the setting of acute pancreatitis , anticipate discharge in one to 2 days     Consultants:  None  Procedures:  None  Antibiotics: Anti-infectives    None         HPI/Subjective: Continues to have mild abdominal cramping in her lower abdomen, denies abdominal pain, low-grade fever, denies cough or chest pain  Objective: Filed Vitals:   03/16/15 2100 03/16/15 2147 03/17/15 0410 03/17/15 0500  BP: 135/62 135/74 144/63   Pulse: 72 80 75   Temp:  99.4 F (37.4 C) 100.5 F (38.1 C)   TempSrc:  Oral Oral   Resp: 30 19 19    Height:    5\' 1"  (1.549 m)  Weight:    70.4 kg (155 lb 3.3 oz)  SpO2: 98% 100% 100%     Intake/Output Summary (Last 24 hours) at 03/17/15 1321 Last data filed at 03/17/15 1139  Gross per 24 hour  Intake 1762.92 ml  Output      0 ml  Net 1762.92 ml    Exam:  General: No acute respiratory distress Lungs: Clear to auscultation bilaterally without wheezes or crackles Cardiovascular: Regular rate and rhythm without murmur gallop or rub normal S1 and S2 Abdomen: Nontender, nondistended, soft, bowel sounds positive, no rebound, no ascites, no appreciable mass Extremities: No significant cyanosis, clubbing, or edema bilateral lower extremities  Data Review   Micro Results No results found for this or any previous visit (from the past 240 hour(s)).  Radiology Reports Dg Ribs Unilateral W/chest Right  03/16/2015  CLINICAL DATA:  Fall 4 days ago. Left rib and chest pain. Initial encounter. EXAM: RIGHT RIBS AND CHEST - 3+ VIEW COMPARISON:  11/18/2014 FINDINGS: No fracture or other bone lesions are seen involving the ribs. There is no evidence of pneumothorax or pleural effusion. Both lungs are clear.  Heart size and mediastinal contours are within normal limits. IMPRESSION: Negative. Electronically Signed   By: Earle Gell M.D.   On: 03/16/2015 17:14   Ct Abdomen Pelvis W Contrast  03/16/2015  CLINICAL DATA:  Diffuse abdominal pain for 4 days. Nausea. Previous hiatal hernia repair and hysterectomy. EXAM: CT ABDOMEN AND PELVIS WITH CONTRAST TECHNIQUE: Multidetector CT imaging of the abdomen and pelvis was performed using the standard protocol following bolus administration of intravenous contrast. CONTRAST:  16mL OMNIPAQUE IOHEXOL 300 MG/ML  SOLN COMPARISON:  None. FINDINGS: Lower chest: No acute findings. Surgical clips noted from previous hiatal hernia repair. Hepatobiliary: No masses or other significant abnormality. Gallbladder is unremarkable. No evidence of biliary ductal dilatation. Pancreas: The pancreatic head shows mild swelling and peripancreatic soft tissue stranding, suspicious for mild acute pancreatitis. No evidence of pancreatic ductal dilatation. A 1.4 cm simple appearing cyst is seen in the pancreatic uncinate process, likely representing a tiny pseudocyst. No definite solid pancreatic mass or pancreatic necrosis demonstrated. Spleen: Within normal limits in size and appearance. Adrenals/Urinary Tract: No masses identified. No evidence of hydronephrosis. Several tiny renal cysts noted bilaterally. Stomach/Bowel: No evidence of obstruction, inflammatory process, or abnormal fluid collections. Normal appendix visualized. Vascular/Lymphatic: No pathologically enlarged lymph nodes. No evidence of abdominal aortic aneurysm. Reproductive: Prior hysterectomy noted. Adnexal regions are unremarkable in appearance. Other: None. Musculoskeletal:  No suspicious bone lesions identified. IMPRESSION: Findings consistent with mild acute pancreatitis involving the pancreatic head, with probable tiny 1.4 cm pseudocyst in the uncinate process. Continued followup by CT recommended in 2-3 months. Electronically  Signed   By: Earle Gell M.D.   On: 03/16/2015 18:47     CBC  Recent Labs Lab 03/16/15 1455 03/17/15 0533  WBC 14.3* 15.4*  HGB 15.1* 13.4  HCT 45.2 41.5  PLT 260 238  MCV 94.2 95.2  MCH 31.5 30.7  MCHC 33.4 32.3  RDW 12.8 13.1    Chemistries   Recent Labs Lab 03/16/15 1455 03/17/15 0533  NA 140 137  K 4.5 4.1  CL 104 101  CO2 25 26  GLUCOSE 125* 142*  BUN 7 6  CREATININE 0.78 0.80  CALCIUM 9.4 8.5*  AST 19 17  ALT 15 12*  ALKPHOS 67 59  BILITOT 0.6 1.1   ------------------------------------------------------------------------------------------------------------------ estimated creatinine clearance is 62 mL/min (by C-G formula based on Cr of 0.8). ------------------------------------------------------------------------------------------------------------------ No results for input(s): HGBA1C in the last 72 hours. ------------------------------------------------------------------------------------------------------------------  Recent Labs  03/16/15 2124  CHOL 176  HDL 61  LDLCALC 97  TRIG 92  CHOLHDL 2.9   ------------------------------------------------------------------------------------------------------------------ No results for input(s): TSH, T4TOTAL, T3FREE, THYROIDAB in the last 72 hours.  Invalid input(s): FREET3 ------------------------------------------------------------------------------------------------------------------ No results for input(s): VITAMINB12, FOLATE, FERRITIN, TIBC, IRON, RETICCTPCT in the last 72 hours.  Coagulation profile No results for input(s): INR, PROTIME in the last 168 hours.  No results for input(s): DDIMER in the last 72 hours.  Cardiac Enzymes No results for input(s): CKMB, TROPONINI, MYOGLOBIN in the last 168 hours.  Invalid input(s):  CK ------------------------------------------------------------------------------------------------------------------ Invalid input(s): POCBNP   CBG: No results for  input(s): GLUCAP in the last 168 hours.     Studies: Dg Ribs Unilateral W/chest Right  03/16/2015  CLINICAL DATA:  Fall 4 days ago. Left rib and chest pain. Initial encounter. EXAM: RIGHT RIBS AND CHEST - 3+ VIEW COMPARISON:  11/18/2014 FINDINGS: No fracture or other bone lesions are seen involving the ribs. There is no evidence of pneumothorax or pleural effusion. Both lungs are clear. Heart size and mediastinal contours are within normal limits. IMPRESSION: Negative. Electronically Signed   By: Earle Gell M.D.   On: 03/16/2015 17:14   Ct Abdomen Pelvis W Contrast  03/16/2015  CLINICAL DATA:  Diffuse abdominal pain for 4 days. Nausea. Previous hiatal hernia repair and hysterectomy. EXAM: CT ABDOMEN AND PELVIS WITH CONTRAST TECHNIQUE: Multidetector CT imaging of the abdomen and pelvis was performed using the standard protocol following bolus administration of intravenous contrast. CONTRAST:  164mL OMNIPAQUE IOHEXOL 300 MG/ML  SOLN COMPARISON:  None. FINDINGS: Lower chest: No acute findings. Surgical clips noted from previous hiatal hernia repair. Hepatobiliary: No masses or other significant abnormality. Gallbladder is unremarkable. No evidence of biliary ductal dilatation. Pancreas: The pancreatic head shows mild swelling and peripancreatic soft tissue stranding, suspicious for mild acute pancreatitis. No evidence of pancreatic ductal dilatation. A 1.4 cm simple appearing cyst is seen in the pancreatic uncinate process, likely representing a tiny pseudocyst. No definite solid pancreatic mass or pancreatic necrosis demonstrated. Spleen: Within normal limits in size and appearance. Adrenals/Urinary Tract: No masses identified. No evidence of hydronephrosis. Several tiny renal cysts noted bilaterally. Stomach/Bowel: No evidence of obstruction, inflammatory process, or abnormal fluid collections. Normal appendix visualized. Vascular/Lymphatic: No pathologically enlarged lymph nodes. No evidence of abdominal  aortic aneurysm. Reproductive: Prior hysterectomy noted. Adnexal regions are unremarkable in appearance. Other: None. Musculoskeletal:  No suspicious bone lesions identified. IMPRESSION: Findings consistent with mild acute pancreatitis involving the pancreatic head, with probable tiny 1.4 cm pseudocyst in the uncinate process. Continued followup by CT recommended in 2-3 months. Electronically Signed   By: Earle Gell M.D.   On: 03/16/2015 18:47      Lab Results  Component Value Date   HGBA1C 5.6 12/22/2011   Lab Results  Component Value Date   LDLCALC 97 03/16/2015   CREATININE 0.80 03/17/2015       Scheduled Meds: . aspirin  81 mg Oral Daily  . enoxaparin (LOVENOX) injection  40 mg Subcutaneous Q24H  . famotidine  10 mg Oral BID   Continuous Infusions: . sodium chloride 1 mL (03/17/15 0719)    Active Problems:   Pancreatitis   Pancreatitis, acute   Epigastric abdominal pain    Time spent: 45 minutes   [facility] Pager [phone]. If 7PM-7AM, please contact night-coverage at [email or web address], password [facility] 03/17/2015, 1:21 PM  LOS: 1 day

## 2015-03-17 NOTE — Progress Notes (Signed)
Utilization review completed.  

## 2015-03-18 ENCOUNTER — Inpatient Hospital Stay (HOSPITAL_COMMUNITY): Payer: Medicare Other

## 2015-03-18 DIAGNOSIS — R05 Cough: Secondary | ICD-10-CM

## 2015-03-18 DIAGNOSIS — R059 Cough, unspecified: Secondary | ICD-10-CM | POA: Insufficient documentation

## 2015-03-18 DIAGNOSIS — K859 Acute pancreatitis without necrosis or infection, unspecified: Secondary | ICD-10-CM | POA: Insufficient documentation

## 2015-03-18 LAB — CBC
HEMATOCRIT: 37.7 % (ref 36.0–46.0)
HEMOGLOBIN: 12 g/dL (ref 12.0–15.0)
MCH: 30.5 pg (ref 26.0–34.0)
MCHC: 31.8 g/dL (ref 30.0–36.0)
MCV: 95.7 fL (ref 78.0–100.0)
Platelets: 203 10*3/uL (ref 150–400)
RBC: 3.94 MIL/uL (ref 3.87–5.11)
RDW: 12.9 % (ref 11.5–15.5)
WBC: 10.1 10*3/uL (ref 4.0–10.5)

## 2015-03-18 LAB — COMPREHENSIVE METABOLIC PANEL
ALBUMIN: 2.6 g/dL — AB (ref 3.5–5.0)
ALK PHOS: 52 U/L (ref 38–126)
ALT: 14 U/L (ref 14–54)
ANION GAP: 7 (ref 5–15)
AST: 19 U/L (ref 15–41)
BILIRUBIN TOTAL: 0.7 mg/dL (ref 0.3–1.2)
BUN: <5 mg/dL — ABNORMAL LOW (ref 6–20)
CALCIUM: 8.1 mg/dL — AB (ref 8.9–10.3)
CO2: 26 mmol/L (ref 22–32)
Chloride: 106 mmol/L (ref 101–111)
Creatinine, Ser: 0.72 mg/dL (ref 0.44–1.00)
GFR calc non Af Amer: >60 mL/min (ref >60)
GLUCOSE: 126 mg/dL — AB (ref 65–99)
Potassium: 3.8 mmol/L (ref 3.5–5.1)
Sodium: 139 mmol/L (ref 135–145)
TOTAL PROTEIN: 5.4 g/dL — AB (ref 6.5–8.1)

## 2015-03-18 LAB — LIPASE, BLOOD: Lipase: 20 U/L (ref 11–51)
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
## 2015-03-18 NOTE — Progress Notes (Signed)
[facility] PROGRESS NOTE  Pamela Daniel [id number] DOB: 1950/07/22 DOA: 03/16/2015 PCP: Kennon Portela, MD  Length of stay: 2   Assessment/Plan: Active Problems:   Pancreatitis   Pancreatitis, acute   Epigastric abdominal pain  Brief summary  65 y.o. female with a hx of PSVT, hiatal hernia (repaired in 2007), HTN, anxiety, GERD, TIA, PUD presents to the Emergency Department complaining of gradual, persistent, progressively worsening epigastric abdominal pain onset 4 days. Patient has also been constipated which is unusual for her. Initially experienced epigastric pain that moved to her lower abdomen. Pt reports constipation which is unusual for her. Pt has had Nissan fundoplication, cardiac ablation and abd hysterectomy. CT scan shows a mild acute pancreatitis this admission.  Assessment and plan Idiopathic acute pancreatitis Gallbladder unremarkable on CT 1.4 cm cyst in the pancreatic uncinate process likely representing tiny pseudocyst Follow-up CT scan recommended in 2-3 months Lipid panel within normal limits Right upper quadrant ultrasound showed mild diffuse hepatic steatosis no evidence of cholelithiasis or cholecystitis Liver function within normal limits White count elevated at 15.4, UA negative Advance to full liquids  Paroxysmal supraventricular tachycardia-cardiac enzymes negative, resolved  Hypertension-patient currently on no medication for this  Anxiety-stable  Asthma-patient recently completed a course of prednisone  Gastroesophageal reflux disease. Continue Protonix  TIA-patient is on a baby aspirin which will be continued      DVT prophylaxsis Lovenox  Code Status:      Code Status Orders     Full code    Start     Ordered   03/16/15 2035  Full code   Continuous     03/16/15 2039    Code Status History    Date Active Date Inactive Code Status Order ID Comments User Context   12/22/2011  3:46 AM 12/23/2011 12:45 AM  Full Code [id number]  [name], RN Inpatient      Family Communication: Discussed in detail with the patient, all imaging results, lab results explained to the patient   Disposition Plan:   Advance to full liquid diet, anticipate discharge tomorrow   Consultants:  None  Procedures:  None  Antibiotics: Anti-infectives    Start     Dose/Rate Route Frequency Ordered Stop   03/17/15 1400  ampicillin-sulbactam (UNASYN) 1.5 g in sodium chloride 0.9 % 50 mL IVPB     1.5 g 100 mL/hr over 30 Minutes Intravenous Every 6 hours 03/17/15 1340           HPI/Subjective: Decreased abdominal pain, nausea is better  Objective: Filed Vitals:   03/17/15 0410 03/17/15 0500 03/17/15 2200 03/18/15 0454  BP: 144/63  131/73 130/73  Pulse: 75  79 75  Temp: 100.5 F (38.1 C)  98.3 F (36.8 C) 98.6 F (37 C)  TempSrc: Oral  Oral Oral  Resp: 19  18 18   Height:  5\' 1"  (1.549 m)    Weight:  70.4 kg (155 lb 3.3 oz)    SpO2: 100%  100% 98%    Intake/Output Summary (Last 24 hours) at 03/18/15 1231 Last data filed at 03/17/15 2200  Gross per 24 hour  Intake 1181.67 ml  Output      0 ml  Net 1181.67 ml    Exam:  General: No acute respiratory distress Lungs: Clear to auscultation bilaterally without wheezes or crackles Cardiovascular: Regular rate and rhythm without murmur gallop or rub normal S1 and S2 Abdomen: Nontender, nondistended, soft, bowel sounds positive, no rebound,  no ascites, no appreciable mass Extremities: No significant cyanosis, clubbing, or edema bilateral lower extremities     Data Review   Micro Results No results found for this or any previous visit (from the past 240 hour(s)).  Radiology Reports Dg Chest 2 View  03/17/2015  CLINICAL DATA:  Cough for 5 days, chest pain, back pain EXAM: CHEST  2 VIEW COMPARISON:  03/16/2015 FINDINGS: Cardiomediastinal silhouette is stable. No acute infiltrate or pleural effusion. No pulmonary edema. Mild  degenerative changes mid thoracic spine. Some linear atelectasis noted lingula and left base. IMPRESSION: No infiltrate or pulmonary edema. Linear atelectasis noted in lingula and left base. Electronically Signed   By: Lahoma Crocker M.D.   On: 03/17/2015 14:55   Dg Ribs Unilateral W/chest Right  03/16/2015  CLINICAL DATA:  Fall 4 days ago. Left rib and chest pain. Initial encounter. EXAM: RIGHT RIBS AND CHEST - 3+ VIEW COMPARISON:  11/18/2014 FINDINGS: No fracture or other bone lesions are seen involving the ribs. There is no evidence of pneumothorax or pleural effusion. Both lungs are clear. Heart size and mediastinal contours are within normal limits. IMPRESSION: Negative. Electronically Signed   By: Earle Gell M.D.   On: 03/16/2015 17:14   Ct Abdomen Pelvis W Contrast  03/16/2015  CLINICAL DATA:  Diffuse abdominal pain for 4 days. Nausea. Previous hiatal hernia repair and hysterectomy. EXAM: CT ABDOMEN AND PELVIS WITH CONTRAST TECHNIQUE: Multidetector CT imaging of the abdomen and pelvis was performed using the standard protocol following bolus administration of intravenous contrast. CONTRAST:  147mL OMNIPAQUE IOHEXOL 300 MG/ML  SOLN COMPARISON:  None. FINDINGS: Lower chest: No acute findings. Surgical clips noted from previous hiatal hernia repair. Hepatobiliary: No masses or other significant abnormality. Gallbladder is unremarkable. No evidence of biliary ductal dilatation. Pancreas: The pancreatic head shows mild swelling and peripancreatic soft tissue stranding, suspicious for mild acute pancreatitis. No evidence of pancreatic ductal dilatation. A 1.4 cm simple appearing cyst is seen in the pancreatic uncinate process, likely representing a tiny pseudocyst. No definite solid pancreatic mass or pancreatic necrosis demonstrated. Spleen: Within normal limits in size and appearance. Adrenals/Urinary Tract: No masses identified. No evidence of hydronephrosis. Several tiny renal cysts noted bilaterally.  Stomach/Bowel: No evidence of obstruction, inflammatory process, or abnormal fluid collections. Normal appendix visualized. Vascular/Lymphatic: No pathologically enlarged lymph nodes. No evidence of abdominal aortic aneurysm. Reproductive: Prior hysterectomy noted. Adnexal regions are unremarkable in appearance. Other: None. Musculoskeletal:  No suspicious bone lesions identified. IMPRESSION: Findings consistent with mild acute pancreatitis involving the pancreatic head, with probable tiny 1.4 cm pseudocyst in the uncinate process. Continued followup by CT recommended in 2-3 months. Electronically Signed   By: Earle Gell M.D.   On: 03/16/2015 18:47   US Abdomen Limited  03/18/2015  CLINICAL DATA:  65 year old with acute pancreatitis. EXAM: US ABDOMEN LIMITED - RIGHT UPPER QUADRANT COMPARISON:  CT abdomen and pelvis 03/16/2015. FINDINGS: Gallbladder: No shadowing gallstones or echogenic sludge. No gallbladder wall thickening or pericholecystic fluid. Negative sonographic Murphy sign according to the ultrasound technologist. Common bile duct: Diameter: Approximately 5 mm. Liver: Diffusely increased and coarsened echotexture without focal hepatic parenchymal abnormality. Patent portal vein with hepatopetal flow. IMPRESSION: 1. Mild diffuse hepatic steatosis and/or hepatocellular disease without focal hepatic parenchymal abnormality. 2. Otherwise normal examination. Specifically, no evidence of cholelithiasis or cholecystitis. Electronically Signed   By: Evangeline Dakin M.D.   On: 03/18/2015 08:03     CBC  Recent Labs Lab 03/16/15 1455 03/17/15 0533 03/18/15 0559  WBC 14.3* 15.4* 10.1  HGB 15.1* 13.4 12.0  HCT 45.2 41.5 37.7  PLT 260 238 203  MCV 94.2 95.2 95.7  MCH 31.5 30.7 30.5  MCHC 33.4 32.3 31.8  RDW 12.8 13.1 12.9    Chemistries   Recent Labs Lab 03/16/15 1455 03/17/15 0533 03/18/15 0559  NA 140 137 139  K 4.5 4.1 3.8  CL 104 101 106  CO2 25 26 26   GLUCOSE 125* 142* 126*  BUN  7 6 <5*  CREATININE 0.78 0.80 0.72  CALCIUM 9.4 8.5* 8.1*  AST 19 17 19   ALT 15 12* 14  ALKPHOS 67 59 52  BILITOT 0.6 1.1 0.7   ------------------------------------------------------------------------------------------------------------------ estimated creatinine clearance is 62 mL/min (by C-G formula based on Cr of 0.72). ------------------------------------------------------------------------------------------------------------------ No results for input(s): HGBA1C in the last 72 hours. ------------------------------------------------------------------------------------------------------------------  Recent Labs  03/16/15 2124  CHOL 176  HDL 61  LDLCALC 97  TRIG 92  CHOLHDL 2.9   ------------------------------------------------------------------------------------------------------------------ No results for input(s): TSH, T4TOTAL, T3FREE, THYROIDAB in the last 72 hours.  Invalid input(s): FREET3 ------------------------------------------------------------------------------------------------------------------ No results for input(s): VITAMINB12, FOLATE, FERRITIN, TIBC, IRON, RETICCTPCT in the last 72 hours.  Coagulation profile No results for input(s): INR, PROTIME in the last 168 hours.  No results for input(s): DDIMER in the last 72 hours.  Cardiac Enzymes No results for input(s): CKMB, TROPONINI, MYOGLOBIN in the last 168 hours.  Invalid input(s): CK ------------------------------------------------------------------------------------------------------------------ Invalid input(s): POCBNP   CBG: No results for input(s): GLUCAP in the last 168 hours.     Studies: Dg Chest 2 View  03/17/2015  CLINICAL DATA:  Cough for 5 days, chest pain, back pain EXAM: CHEST  2 VIEW COMPARISON:  03/16/2015 FINDINGS: Cardiomediastinal silhouette is stable. No acute infiltrate or pleural effusion. No pulmonary edema. Mild degenerative changes mid thoracic spine. Some linear  atelectasis noted lingula and left base. IMPRESSION: No infiltrate or pulmonary edema. Linear atelectasis noted in lingula and left base. Electronically Signed   By: Lahoma Crocker M.D.   On: 03/17/2015 14:55   Dg Ribs Unilateral W/chest Right  03/16/2015  CLINICAL DATA:  Fall 4 days ago. Left rib and chest pain. Initial encounter. EXAM: RIGHT RIBS AND CHEST - 3+ VIEW COMPARISON:  11/18/2014 FINDINGS: No fracture or other bone lesions are seen involving the ribs. There is no evidence of pneumothorax or pleural effusion. Both lungs are clear. Heart size and mediastinal contours are within normal limits. IMPRESSION: Negative. Electronically Signed   By: Earle Gell M.D.   On: 03/16/2015 17:14   Ct Abdomen Pelvis W Contrast  03/16/2015  CLINICAL DATA:  Diffuse abdominal pain for 4 days. Nausea. Previous hiatal hernia repair and hysterectomy. EXAM: CT ABDOMEN AND PELVIS WITH CONTRAST TECHNIQUE: Multidetector CT imaging of the abdomen and pelvis was performed using the standard protocol following bolus administration of intravenous contrast. CONTRAST:  124mL OMNIPAQUE IOHEXOL 300 MG/ML  SOLN COMPARISON:  None. FINDINGS: Lower chest: No acute findings. Surgical clips noted from previous hiatal hernia repair. Hepatobiliary: No masses or other significant abnormality. Gallbladder is unremarkable. No evidence of biliary ductal dilatation. Pancreas: The pancreatic head shows mild swelling and peripancreatic soft tissue stranding, suspicious for mild acute pancreatitis. No evidence of pancreatic ductal dilatation. A 1.4 cm simple appearing cyst is seen in the pancreatic uncinate process, likely representing a tiny pseudocyst. No definite solid pancreatic mass or pancreatic necrosis demonstrated. Spleen: Within normal limits in size and appearance. Adrenals/Urinary Tract: No masses identified. No evidence of hydronephrosis. Several tiny  renal cysts noted bilaterally. Stomach/Bowel: No evidence of obstruction, inflammatory  process, or abnormal fluid collections. Normal appendix visualized. Vascular/Lymphatic: No pathologically enlarged lymph nodes. No evidence of abdominal aortic aneurysm. Reproductive: Prior hysterectomy noted. Adnexal regions are unremarkable in appearance. Other: None. Musculoskeletal:  No suspicious bone lesions identified. IMPRESSION: Findings consistent with mild acute pancreatitis involving the pancreatic head, with probable tiny 1.4 cm pseudocyst in the uncinate process. Continued followup by CT recommended in 2-3 months. Electronically Signed   By: Earle Gell M.D.   On: 03/16/2015 18:47   US Abdomen Limited  03/18/2015  CLINICAL DATA:  65 year old with acute pancreatitis. EXAM: US ABDOMEN LIMITED - RIGHT UPPER QUADRANT COMPARISON:  CT abdomen and pelvis 03/16/2015. FINDINGS: Gallbladder: No shadowing gallstones or echogenic sludge. No gallbladder wall thickening or pericholecystic fluid. Negative sonographic Murphy sign according to the ultrasound technologist. Common bile duct: Diameter: Approximately 5 mm. Liver: Diffusely increased and coarsened echotexture without focal hepatic parenchymal abnormality. Patent portal vein with hepatopetal flow. IMPRESSION: 1. Mild diffuse hepatic steatosis and/or hepatocellular disease without focal hepatic parenchymal abnormality. 2. Otherwise normal examination. Specifically, no evidence of cholelithiasis or cholecystitis. Electronically Signed   By: Evangeline Dakin M.D.   On: 03/18/2015 08:03      Lab Results  Component Value Date   HGBA1C 5.6 12/22/2011   Lab Results  Component Value Date   LDLCALC 97 03/16/2015   CREATININE 0.72 03/18/2015       Scheduled Meds: . ampicillin-sulbactam (UNASYN) IV  1.5 g Intravenous Q6H  . aspirin  81 mg Oral Daily  . enoxaparin (LOVENOX) injection  40 mg Subcutaneous Q24H  . famotidine  10 mg Oral BID  . fluticasone  1 spray Each Nare Daily  . pantoprazole  40 mg Oral Daily   Continuous Infusions: .  sodium chloride 125 mL/hr at 03/18/15 [id number]    Active Problems:   Pancreatitis   Pancreatitis, acute   Epigastric abdominal pain    Time spent: 45 minutes   [facility] Pager [phone]. If 7PM-7AM, please contact night-coverage at [email or web address], password [facility] 03/18/2015, 12:31 PM  LOS: 2 days

## 2015-03-19 MED ORDER — POLYETHYLENE GLYCOL 3350 17 G PO PACK
17.0000 g | PACK | Freq: Every day | ORAL | Status: DC
  Filled 2015-03-19: qty 1

## 2015-03-19 MED ORDER — TRAMADOL HCL 50 MG PO TABS: 50.0000 mg | ORAL_TABLET | Freq: Four times a day (QID) | ORAL | Status: DC | PRN

## 2015-03-19 MED ORDER — MAGNESIUM CITRATE PO SOLN
1.0000 | Freq: Once | ORAL | Status: AC
  Administered 2015-03-19: 1 via ORAL
  Filled 2015-03-19: qty 296

## 2015-03-19 MED ORDER — BISACODYL 10 MG RE SUPP: 10.0000 mg | Freq: Every day | RECTAL | Status: DC | PRN

## 2015-03-19 NOTE — Progress Notes (Signed)
Physician Discharge Summary  DE MINK [id number] DOB: Jun 07, 1950 DOA: 03/16/2015  PCP: Kennon Portela, MD  Admit date: 03/16/2015 Discharge date: 03/19/2015  Time spent: 20 minutes  Recommendations for Outpatient Follow-up:  1. Follow up with PCP in 2-3 weeks  2. Recommend repeat CT abd/pelvis in 2-3 months  Discharge Diagnoses:  Active Problems:   Pancreatitis   Pancreatitis, acute   Epigastric abdominal pain   Acute pancreatitis   Cough   Discharge Condition: Improved  Diet recommendation: Soft, advance as tolerated  Filed Weights   03/17/15 0500  Weight: 70.4 kg (155 lb 3.3 oz)    History of present illness:  Please review dictated H and P from 2/26 for details. Briefly, 65 y.o. female with a hx of PSVT, hiatal hernia (repaired in 2007), HTN, anxiety, GERD, TIA, PUD presents to the Emergency Department complaining of gradual, persistent, progressively worsening epigastric abdominal pain onset 4 days. Patient has also been constipated which is unusual for her. Initially experienced epigastric pain that moved to her lower abdomen. Pt reports constipation which is unusual for her. Pt has had [employer] fundoplication, cardiac ablation and abd hysterectomy. CT scan shows a mild acute pancreatitis this admission.  Hospital Course:  Idiopathic acute pancreatitis Gallbladder unremarkable on CT 1.4 cm cyst in the pancreatic uncinate process likely representing tiny pseudocyst Follow-up CT scan recommended in 2-3 months Lipid panel within normal limits Right upper quadrant ultrasound to further evaluate gallbladder, common bile duct Liver function within normal limits White count elevated at 15.4, UA negative Successfully advanced to soft diet  Paroxysmal supraventricular tachycardia-serial trop neg. Resolved  Hypertension-patient currently on no medication for this  Anxiety-stable  Asthma-patient recently completed a course of prednisone  Gastroesophageal  reflux disease. Continued on Protonix  TIA-patient is on a baby aspirin which was continued   Discharge Exam: Filed Vitals:   03/18/15 0454 03/18/15 1400 03/18/15 2106 03/19/15 0350  BP: 130/73 131/63 107/49 103/45  Pulse: 75 75 85 75  Temp: 98.6 F (37 C) 98.3 F (36.8 C) 98.6 F (37 C) 98.9 F (37.2 C)  TempSrc: Oral Oral Oral Oral  Resp: 18 18 19 19   Height:      Weight:      SpO2: 98% 100% 100% 100%    General: Awake, in nad Cardiovascular: regular, s1, s2 Respiratory: normal resp effort, no wheezing  Discharge Instructions     Medication List    TAKE these medications        albuterol 108 (90 Base) MCG/ACT inhaler  Commonly known as:  PROVENTIL HFA;VENTOLIN HFA  Inhale 2 puffs into the lungs every 4 (four) hours as needed. Shortness of breath/wheezing     aspirin 81 MG chewable tablet  Chew 1 tablet (81 mg total) by mouth daily.     ibuprofen 200 MG tablet  Commonly known as:  ADVIL,MOTRIN  Take 400 mg by mouth every 6 (six) hours as needed for moderate pain.     NONFORMULARY OR COMPOUNDED ITEM  Estradiol 0.02 % 72ml prefilled applicator Sig: apply twice a week     ranitidine 75 MG tablet  Commonly known as:  ZANTAC  Take 75 mg by mouth 2 (two) times daily as needed for heartburn.     traMADol 50 MG tablet  Commonly known as:  ULTRAM  Take 1 tablet (50 mg total) by mouth every 6 (six) hours as needed for moderate pain.       No Known Allergies Follow-up Information    Follow  up with GUEST, Veneda Melter, MD In 2 weeks.   Specialty:  Internal Medicine   Why:  Hospital follow up   Contact information:   [place] [id number] [phone]        The results of significant diagnostics from this hospitalization (including imaging, microbiology, ancillary and laboratory) are listed below for reference.    Significant Diagnostic Studies: Dg Chest 2 View  03/17/2015  CLINICAL DATA:  Cough for 5 days, chest pain, back pain EXAM:  CHEST  2 VIEW COMPARISON:  03/16/2015 FINDINGS: Cardiomediastinal silhouette is stable. No acute infiltrate or pleural effusion. No pulmonary edema. Mild degenerative changes mid thoracic spine. Some linear atelectasis noted lingula and left base. IMPRESSION: No infiltrate or pulmonary edema. Linear atelectasis noted in lingula and left base. Electronically Signed   By: Lahoma Crocker M.D.   On: 03/17/2015 14:55   Dg Ribs Unilateral W/chest Right  03/16/2015  CLINICAL DATA:  Fall 4 days ago. Left rib and chest pain. Initial encounter. EXAM: RIGHT RIBS AND CHEST - 3+ VIEW COMPARISON:  11/18/2014 FINDINGS: No fracture or other bone lesions are seen involving the ribs. There is no evidence of pneumothorax or pleural effusion. Both lungs are clear. Heart size and mediastinal contours are within normal limits. IMPRESSION: Negative. Electronically Signed   By: Earle Gell M.D.   On: 03/16/2015 17:14   Ct Abdomen Pelvis W Contrast  03/16/2015  CLINICAL DATA:  Diffuse abdominal pain for 4 days. Nausea. Previous hiatal hernia repair and hysterectomy. EXAM: CT ABDOMEN AND PELVIS WITH CONTRAST TECHNIQUE: Multidetector CT imaging of the abdomen and pelvis was performed using the standard protocol following bolus administration of intravenous contrast. CONTRAST:  15mL OMNIPAQUE IOHEXOL 300 MG/ML  SOLN COMPARISON:  None. FINDINGS: Lower chest: No acute findings. Surgical clips noted from previous hiatal hernia repair. Hepatobiliary: No masses or other significant abnormality. Gallbladder is unremarkable. No evidence of biliary ductal dilatation. Pancreas: The pancreatic head shows mild swelling and peripancreatic soft tissue stranding, suspicious for mild acute pancreatitis. No evidence of pancreatic ductal dilatation. A 1.4 cm simple appearing cyst is seen in the pancreatic uncinate process, likely representing a tiny pseudocyst. No definite solid pancreatic mass or pancreatic necrosis demonstrated. Spleen: Within normal  limits in size and appearance. Adrenals/Urinary Tract: No masses identified. No evidence of hydronephrosis. Several tiny renal cysts noted bilaterally. Stomach/Bowel: No evidence of obstruction, inflammatory process, or abnormal fluid collections. Normal appendix visualized. Vascular/Lymphatic: No pathologically enlarged lymph nodes. No evidence of abdominal aortic aneurysm. Reproductive: Prior hysterectomy noted. Adnexal regions are unremarkable in appearance. Other: None. Musculoskeletal:  No suspicious bone lesions identified. IMPRESSION: Findings consistent with mild acute pancreatitis involving the pancreatic head, with probable tiny 1.4 cm pseudocyst in the uncinate process. Continued followup by CT recommended in 2-3 months. Electronically Signed   By: Earle Gell M.D.   On: 03/16/2015 18:47   US Abdomen Limited  03/18/2015  CLINICAL DATA:  65 year old with acute pancreatitis. EXAM: US ABDOMEN LIMITED - RIGHT UPPER QUADRANT COMPARISON:  CT abdomen and pelvis 03/16/2015. FINDINGS: Gallbladder: No shadowing gallstones or echogenic sludge. No gallbladder wall thickening or pericholecystic fluid. Negative sonographic Murphy sign according to the ultrasound technologist. Common bile duct: Diameter: Approximately 5 mm. Liver: Diffusely increased and coarsened echotexture without focal hepatic parenchymal abnormality. Patent portal vein with hepatopetal flow. IMPRESSION: 1. Mild diffuse hepatic steatosis and/or hepatocellular disease without focal hepatic parenchymal abnormality. 2. Otherwise normal examination. Specifically, no evidence of cholelithiasis or cholecystitis. Electronically Signed  By: Evangeline Dakin M.D.   On: 03/18/2015 08:03    Microbiology: No results found for this or any previous visit (from the past 240 hour(s)).   Labs: Basic Metabolic Panel:  Recent Labs Lab 03/16/15 1455 03/17/15 0533 03/18/15 0559  NA 140 137 139  K 4.5 4.1 3.8  CL 104 101 106  CO2 25 26 26   GLUCOSE  125* 142* 126*  BUN 7 6 <5*  CREATININE 0.78 0.80 0.72  CALCIUM 9.4 8.5* 8.1*   Liver Function Tests:  Recent Labs Lab 03/16/15 1455 03/17/15 0533 03/18/15 0559  AST 19 17 19   ALT 15 12* 14  ALKPHOS 67 59 52  BILITOT 0.6 1.1 0.7  PROT 6.8 6.1* 5.4*  ALBUMIN 3.6 3.1* 2.6*    Recent Labs Lab 03/16/15 1749 03/18/15 0559  LIPASE 39 20   No results for input(s): AMMONIA in the last 168 hours. CBC:  Recent Labs Lab 03/16/15 1455 03/17/15 0533 03/18/15 0559  WBC 14.3* 15.4* 10.1  HGB 15.1* 13.4 12.0  HCT 45.2 41.5 37.7  MCV 94.2 95.2 95.7  PLT 260 238 203   Cardiac Enzymes: No results for input(s): CKTOTAL, CKMB, CKMBINDEX, TROPONINI in the last 168 hours. BNP: BNP (last 3 results) No results for input(s): BNP in the last 8760 hours.  ProBNP (last 3 results) No results for input(s): PROBNP in the last 8760 hours.  CBG: No results for input(s): GLUCAP in the last 168 hours.   Signed:  CHIU, Orpah Melter  [facility] 03/19/2015, 2:35 PM

## 2015-03-19 NOTE — Progress Notes (Signed)
IV removed after last antibiotic infused.  AVS given to patient.  Understanding demonstrated. Belongings packed. Transportation arranged by patient.

## 2015-03-19 NOTE — Care Management Important Message (Signed)
Important Message  Patient Details  Name: Pamela Daniel MRN: [id number] Date of Birth: 1950-02-24   Medicare Important Message Given:  Yes    Savannah Erbe P Keshan Reha 03/19/2015, 3:48 PM

## 2015-04-02 NOTE — Discharge Summary (Signed)
Physician Discharge Summary  Pamela Daniel [id number] DOB: 09-May-1950 DOA: 03/16/2015  PCP: Kennon Portela, MD  Admit date: 03/16/2015 Discharge date: 03/19/2015  Time spent: 20 minutes  Recommendations for Outpatient Follow-up:  1. Follow up with PCP in 2-3 weeks  2. Recommend repeat CT abd/pelvis in 2-3 months  Discharge Diagnoses:  Active Problems:  Pancreatitis  Pancreatitis, acute  Epigastric abdominal pain  Acute pancreatitis  Cough   Discharge Condition: Improved  Diet recommendation: Soft, advance as tolerated  Filed Weights   03/17/15 0500  Weight: 70.4 kg (155 lb 3.3 oz)    History of present illness:  Please review dictated H and P from 2/26 for details. Briefly, 65 y.o. female with a hx of PSVT, hiatal hernia (repaired in 2007), HTN, anxiety, GERD, TIA, PUD presents to the Emergency Department complaining of gradual, persistent, progressively worsening epigastric abdominal pain onset 4 days. Patient has also been constipated which is unusual for her. Initially experienced epigastric pain that moved to her lower abdomen. Pt reports constipation which is unusual for her. Pt has had Nissan fundoplication, cardiac ablation and abd hysterectomy. CT scan shows a mild acute pancreatitis this admission.  Hospital Course:  Idiopathic acute pancreatitis Gallbladder unremarkable on CT 1.4 cm cyst in the pancreatic uncinate process likely representing tiny pseudocyst Follow-up CT scan recommended in 2-3 months Lipid panel within normal limits Right upper quadrant ultrasound to further evaluate gallbladder, common bile duct Liver function within normal limits White count elevated at 15.4, UA negative Successfully advanced to soft diet  Paroxysmal supraventricular tachycardia-serial trop neg. Resolved  Hypertension-patient currently on no medication for this  Anxiety-stable  Asthma-patient recently completed a course of  prednisone  Gastroesophageal reflux disease. Continued on Protonix  TIA-patient is on a baby aspirin which was continued   Discharge Exam: Filed Vitals:   03/18/15 0454 03/18/15 1400 03/18/15 2106 03/19/15 0350  BP: 130/73 131/63 107/49 103/45  Pulse: 75 75 85 75  Temp: 98.6 F (37 C) 98.3 F (36.8 C) 98.6 F (37 C) 98.9 F (37.2 C)  TempSrc: Oral Oral Oral Oral  Resp: 18 18 19 19   Height:      Weight:      SpO2: 98% 100% 100% 100%    General: Awake, in nad Cardiovascular: regular, s1, s2 Respiratory: normal resp effort, no wheezing  Discharge Instructions     Medication List    TAKE these medications       albuterol 108 (90 Base) MCG/ACT inhaler  Commonly known as: PROVENTIL HFA;VENTOLIN HFA  Inhale 2 puffs into the lungs every 4 (four) hours as needed. Shortness of breath/wheezing     aspirin 81 MG chewable tablet  Chew 1 tablet (81 mg total) by mouth daily.     ibuprofen 200 MG tablet  Commonly known as: ADVIL,MOTRIN  Take 400 mg by mouth every 6 (six) hours as needed for moderate pain.     NONFORMULARY OR COMPOUNDED ITEM  Estradiol 0.02 % 48ml prefilled applicator Sig: apply twice a week     ranitidine 75 MG tablet  Commonly known as: ZANTAC  Take 75 mg by mouth 2 (two) times daily as needed for heartburn.     traMADol 50 MG tablet  Commonly known as: ULTRAM  Take 1 tablet (50 mg total) by mouth every 6 (six) hours as needed for moderate pain.       No Known Allergies Follow-up Information    Follow up with GUEST, Veneda Melter, MD In 2 weeks.  Specialty: Internal Medicine   Why: Hospital follow up   Contact information:   [place] [id number] [phone]         The results of significant diagnostics from this hospitalization (including imaging, microbiology, ancillary and laboratory) are listed below for reference.     Significant Diagnostic Studies:  Imaging Results    Dg Chest 2 View  03/17/2015 CLINICAL DATA: Cough for 5 days, chest pain, back pain EXAM: CHEST 2 VIEW COMPARISON: 03/16/2015 FINDINGS: Cardiomediastinal silhouette is stable. No acute infiltrate or pleural effusion. No pulmonary edema. Mild degenerative changes mid thoracic spine. Some linear atelectasis noted lingula and left base. IMPRESSION: No infiltrate or pulmonary edema. Linear atelectasis noted in lingula and left base. Electronically Signed By: Lahoma Crocker M.D. On: 03/17/2015 14:55   Dg Ribs Unilateral W/chest Right  03/16/2015 CLINICAL DATA: Fall 4 days ago. Left rib and chest pain. Initial encounter. EXAM: RIGHT RIBS AND CHEST - 3+ VIEW COMPARISON: 11/18/2014 FINDINGS: No fracture or other bone lesions are seen involving the ribs. There is no evidence of pneumothorax or pleural effusion. Both lungs are clear. Heart size and mediastinal contours are within normal limits. IMPRESSION: Negative. Electronically Signed By: Earle Gell M.D. On: 03/16/2015 17:14   Ct Abdomen Pelvis W Contrast  03/16/2015 CLINICAL DATA: Diffuse abdominal pain for 4 days. Nausea. Previous hiatal hernia repair and hysterectomy. EXAM: CT ABDOMEN AND PELVIS WITH CONTRAST TECHNIQUE: Multidetector CT imaging of the abdomen and pelvis was performed using the standard protocol following bolus administration of intravenous contrast. CONTRAST: 176mL OMNIPAQUE IOHEXOL 300 MG/ML SOLN COMPARISON: None. FINDINGS: Lower chest: No acute findings. Surgical clips noted from previous hiatal hernia repair. Hepatobiliary: No masses or other significant abnormality. Gallbladder is unremarkable. No evidence of biliary ductal dilatation. Pancreas: The pancreatic head shows mild swelling and peripancreatic soft tissue stranding, suspicious for mild acute pancreatitis. No evidence of pancreatic ductal dilatation. A 1.4 cm simple appearing cyst is seen in the pancreatic  uncinate process, likely representing a tiny pseudocyst. No definite solid pancreatic mass or pancreatic necrosis demonstrated. Spleen: Within normal limits in size and appearance. Adrenals/Urinary Tract: No masses identified. No evidence of hydronephrosis. Several tiny renal cysts noted bilaterally. Stomach/Bowel: No evidence of obstruction, inflammatory process, or abnormal fluid collections. Normal appendix visualized. Vascular/Lymphatic: No pathologically enlarged lymph nodes. No evidence of abdominal aortic aneurysm. Reproductive: Prior hysterectomy noted. Adnexal regions are unremarkable in appearance. Other: None. Musculoskeletal: No suspicious bone lesions identified. IMPRESSION: Findings consistent with mild acute pancreatitis involving the pancreatic head, with probable tiny 1.4 cm pseudocyst in the uncinate process. Continued followup by CT recommended in 2-3 months. Electronically Signed By: Earle Gell M.D. On: 03/16/2015 18:47   US Abdomen Limited  03/18/2015 CLINICAL DATA: 65 year old with acute pancreatitis. EXAM: US ABDOMEN LIMITED - RIGHT UPPER QUADRANT COMPARISON: CT abdomen and pelvis 03/16/2015. FINDINGS: Gallbladder: No shadowing gallstones or echogenic sludge. No gallbladder wall thickening or pericholecystic fluid. Negative sonographic Murphy sign according to the ultrasound technologist. Common bile duct: Diameter: Approximately 5 mm. Liver: Diffusely increased and coarsened echotexture without focal hepatic parenchymal abnormality. Patent portal vein with hepatopetal flow. IMPRESSION: 1. Mild diffuse hepatic steatosis and/or hepatocellular disease without focal hepatic parenchymal abnormality. 2. Otherwise normal examination. Specifically, no evidence of cholelithiasis or cholecystitis. Electronically Signed By: Evangeline Dakin M.D. On: 03/18/2015 08:03     Microbiology: No results found for this or any previous visit (from the past 240 hour(s)).   Labs: Basic  Metabolic Panel:  Last Labs  Recent Labs Lab 03/16/15 1455 03/17/15 0533 03/18/15 0559  NA 140 137 139  K 4.5 4.1 3.8  CL 104 101 106  CO2 25 26 26   GLUCOSE 125* 142* 126*  BUN 7 6 <5*  CREATININE 0.78 0.80 0.72  CALCIUM 9.4 8.5* 8.1*     Liver Function Tests:  Last Labs      Recent Labs Lab 03/16/15 1455 03/17/15 0533 03/18/15 0559  AST 19 17 19   ALT 15 12* 14  ALKPHOS 67 59 52  BILITOT 0.6 1.1 0.7  PROT 6.8 6.1* 5.4*  ALBUMIN 3.6 3.1* 2.6*      Last Labs      Recent Labs Lab 03/16/15 1749 03/18/15 0559  LIPASE 39 20      Last Labs     No results for input(s): AMMONIA in the last 168 hours.   CBC:  Last Labs      Recent Labs Lab 03/16/15 1455 03/17/15 0533 03/18/15 0559  WBC 14.3* 15.4* 10.1  HGB 15.1* 13.4 12.0  HCT 45.2 41.5 37.7  MCV 94.2 95.2 95.7  PLT 260 238 203     Cardiac Enzymes:  Last Labs     No results for input(s): CKTOTAL, CKMB, CKMBINDEX, TROPONINI in the last 168 hours.   BNP: BNP (last 3 results)  Recent Labs (within last 365 days)    No results for input(s): BNP in the last 8760 hours.    ProBNP (last 3 results)  Recent Labs (within last 365 days)    No results for input(s): PROBNP in the last 8760 hours.    CBG:  Last Labs     No results for input(s): GLUCAP in the last 168 hours.     Signed:  CHIU, Orpah Melter [facility] 03/19/2015, 2:35 PM

## 2015-04-06 ENCOUNTER — Emergency Department (HOSPITAL_COMMUNITY)
Admission: EM | Admit: 2015-04-06 | Discharge: 2015-04-06 | Disposition: A | Discharge status: Home / Self Care | Payer: Medicare Other | Attending: Emergency Medicine | Admitting: Emergency Medicine

## 2015-04-06 ENCOUNTER — Encounter (HOSPITAL_COMMUNITY): Payer: Self-pay | Admitting: Adult Health

## 2015-04-06 ENCOUNTER — Emergency Department (HOSPITAL_COMMUNITY): Payer: Medicare Other

## 2015-04-06 DIAGNOSIS — Z8673 Personal history of transient ischemic attack (TIA), and cerebral infarction without residual deficits: Secondary | ICD-10-CM | POA: Insufficient documentation

## 2015-04-06 DIAGNOSIS — R0602 Shortness of breath: Secondary | ICD-10-CM | POA: Diagnosis present

## 2015-04-06 DIAGNOSIS — Z7982 Long term (current) use of aspirin: Secondary | ICD-10-CM | POA: Insufficient documentation

## 2015-04-06 DIAGNOSIS — R2240 Localized swelling, mass and lump, unspecified lower limb: Secondary | ICD-10-CM | POA: Insufficient documentation

## 2015-04-06 DIAGNOSIS — F419 Anxiety disorder, unspecified: Secondary | ICD-10-CM | POA: Diagnosis not present

## 2015-04-06 DIAGNOSIS — J45901 Unspecified asthma with (acute) exacerbation: Secondary | ICD-10-CM | POA: Insufficient documentation

## 2015-04-06 DIAGNOSIS — Z7952 Long term (current) use of systemic steroids: Secondary | ICD-10-CM | POA: Insufficient documentation

## 2015-04-06 DIAGNOSIS — K219 Gastro-esophageal reflux disease without esophagitis: Secondary | ICD-10-CM | POA: Insufficient documentation

## 2015-04-06 DIAGNOSIS — I10 Essential (primary) hypertension: Secondary | ICD-10-CM | POA: Diagnosis not present

## 2015-04-06 DIAGNOSIS — M1990 Unspecified osteoarthritis, unspecified site: Secondary | ICD-10-CM | POA: Diagnosis not present

## 2015-04-06 DIAGNOSIS — M47819 Spondylosis without myelopathy or radiculopathy, site unspecified: Secondary | ICD-10-CM | POA: Diagnosis not present

## 2015-04-06 LAB — CBC
HEMATOCRIT: 45.2 % (ref 36.0–46.0)
HEMOGLOBIN: 15 g/dL (ref 12.0–15.0)
MCH: 30.8 pg (ref 26.0–34.0)
MCHC: 33.2 g/dL (ref 30.0–36.0)
MCV: 92.8 fL (ref 78.0–100.0)
Platelets: 235 10*3/uL (ref 150–400)
RBC: 4.87 MIL/uL (ref 3.87–5.11)
RDW: 12.7 % (ref 11.5–15.5)
WBC: 4.2 10*3/uL (ref 4.0–10.5)

## 2015-04-06 LAB — BRAIN NATRIURETIC PEPTIDE: B NATRIURETIC PEPTIDE 5: 15.4 pg/mL (ref 0.0–100.0)

## 2015-04-06 LAB — BASIC METABOLIC PANEL
ANION GAP: 10 (ref 5–15)
BUN: 9 mg/dL (ref 6–20)
CALCIUM: 9.4 mg/dL (ref 8.9–10.3)
CO2: 25 mmol/L (ref 22–32)
Chloride: 106 mmol/L (ref 101–111)
Creatinine, Ser: 0.87 mg/dL (ref 0.44–1.00)
GLUCOSE: 143 mg/dL — AB (ref 65–99)
POTASSIUM: 3.7 mmol/L (ref 3.5–5.1)
Sodium: 141 mmol/L (ref 135–145)

## 2015-04-06 LAB — I-STAT TROPONIN, ED: TROPONIN I, POC: 0 ng/mL (ref 0.00–0.08)

## 2015-04-06 LAB — LIPASE, BLOOD: LIPASE: 28 U/L (ref 11–51)

## 2015-04-06 MED ORDER — PREDNISONE 20 MG PO TABS: 60.0000 mg | ORAL_TABLET | Freq: Every day | ORAL | Status: AC

## 2015-04-06 MED ORDER — ALBUTEROL SULFATE HFA 108 (90 BASE) MCG/ACT IN AERS: 2.0000 | INHALATION_SPRAY | RESPIRATORY_TRACT | Status: DC | PRN

## 2015-04-06 MED ORDER — IPRATROPIUM-ALBUTEROL 0.5-2.5 (3) MG/3ML IN SOLN
RESPIRATORY_TRACT | Status: AC
  Filled 2015-04-06: qty 3

## 2015-04-06 MED ORDER — ALBUTEROL SULFATE (2.5 MG/3ML) 0.083% IN NEBU: 5.0000 mg | INHALATION_SOLUTION | Freq: Once | RESPIRATORY_TRACT | Status: DC

## 2015-04-06 MED ORDER — IOHEXOL 350 MG/ML SOLN
100.0000 mL | Freq: Once | INTRAVENOUS | Status: AC | PRN
  Administered 2015-04-06: 80 mL via INTRAVENOUS

## 2015-04-06 MED ORDER — ALBUTEROL SULFATE (2.5 MG/3ML) 0.083% IN NEBU
INHALATION_SOLUTION | RESPIRATORY_TRACT | Status: AC
  Filled 2015-04-06: qty 6

## 2015-04-06 MED ORDER — PREDNISONE 20 MG PO TABS
60.0000 mg | ORAL_TABLET | Freq: Every day | ORAL | Status: DC
  Administered 2015-04-06: 60 mg via ORAL
  Filled 2015-04-06: qty 3

## 2015-04-06 MED ORDER — ALBUTEROL SULFATE (2.5 MG/3ML) 0.083% IN NEBU
5.0000 mg | INHALATION_SOLUTION | Freq: Once | RESPIRATORY_TRACT | Status: AC
  Administered 2015-04-06: 5 mg via RESPIRATORY_TRACT

## 2015-04-06 NOTE — ED Notes (Addendum)
Presents with SOB and central chest tightness and pain that began Wednesday-SOB is worse with exertion and lying down, chest pain is intermtittent and not present at this time. SOB associated with dizziness. Right lung with inspiratory and expiratory wheezes, left diminished with faint expiratory wheeze.  Able to speak in Short phrases, increased work of breathing, RR 28-30. Endorses generalized edema. No swelling noted to bilateral lower extremities.

## 2015-04-06 NOTE — Discharge Instructions (Signed)
Asthma, Adult Asthma is a condition of the lungs in which the airways tighten and narrow. Asthma can make it hard to breathe. Asthma cannot be cured, but medicine and lifestyle changes can help control it. Asthma may be started (triggered) by:  Animal skin flakes (dander).  Dust.  Cockroaches.  Pollen.  Mold.  Smoke.  Cleaning products.  Hair sprays or aerosol sprays.  Paint fumes or strong smells.  Cold air, weather changes, and winds.  Crying or laughing hard.  Stress.  Certain medicines or drugs.  Foods, such as dried fruit, potato chips, and sparkling grape juice.  Infections or conditions (colds, flu).  Exercise.  Certain medical conditions or diseases.  Exercise or tiring activities. HOME CARE   Take medicine as told by your doctor.  Use a peak flow meter as told by your doctor. A peak flow meter is a tool that measures how well the lungs are working.  Record and keep track of the peak flow meter's readings.  Understand and use the asthma action plan. An asthma action plan is a written plan for taking care of your asthma and treating your attacks.  To help prevent asthma attacks:  Do not smoke. Stay away from secondhand smoke.  Change your heating and air conditioning filter often.  Limit your use of fireplaces and wood stoves.  Get rid of pests (such as roaches and mice) and their droppings.  Throw away plants if you see mold on them.  Clean your floors. Dust regularly. Use cleaning products that do not smell.  Have someone vacuum when you are not home. Use a vacuum cleaner with a HEPA filter if possible.  Replace carpet with wood, tile, or vinyl flooring. Carpet can trap animal skin flakes and dust.  Use allergy-proof pillows, mattress covers, and box spring covers.  Wash bed sheets and blankets every week in hot water and dry them in a dryer.  Use blankets that are made of polyester or cotton.  Clean bathrooms and kitchens with bleach.  If possible, have someone repaint the walls in these rooms with mold-resistant paint. Keep out of the rooms that are being cleaned and painted.  Wash hands often. GET HELP IF:  You have make a whistling sound when breaking (wheeze), have shortness of breath, or have a cough even if taking medicine to prevent attacks.  The colored mucus you cough up (sputum) is thicker than usual.  The colored mucus you cough up changes from clear or white to yellow, green, gray, or bloody.  You have problems from the medicine you are taking such as:  A rash.  Itching.  Swelling.  Trouble breathing.  You need reliever medicines more than 2-3 times a week.  Your peak flow measurement is still at 50-79% of your personal best after following the action plan for 1 hour.  You have a fever. GET HELP RIGHT AWAY IF:   You seem to be worse and are not responding to medicine during an asthma attack.  You are short of breath even at rest.  You get short of breath when doing very little activity.  You have trouble eating, drinking, or talking.  You have chest pain.  You have a fast heartbeat.  Your lips or fingernails start to turn blue.  You are light-headed, dizzy, or faint.  Your peak flow is less than 50% of your personal best.   This information is not intended to replace advice given to you by your health care provider. Make sure   you discuss any questions you have with your health care provider.   Document Released: 06/23/2007 Document Revised: 09/25/2014 Document Reviewed: 08/03/2012 Elsevier Interactive Patient Education 2016 [employer]  

## 2015-04-06 NOTE — ED Provider Notes (Signed)
CSN: [id number]     Arrival date & time 04/06/15  1437 History   First MD Initiated Contact with Patient 04/06/15 1516     Chief Complaint  Patient presents with  . Shortness of Breath     (Consider location/radiation/quality/duration/timing/severity/associated sxs/prior Treatment) Patient is a 65 y.o. female presenting with shortness of breath. The history is provided by the patient.  Shortness of Breath Severity:  Moderate Onset quality:  Gradual Duration:  4 days Timing:  Constant Progression:  Worsening Chronicity:  Recurrent Context comment:  Patient has hx of asthma, DVT, and pneumonia Relieved by:  Nothing Worsened by:  Deep breathing Associated symptoms: chest pain (obnly on deep breathing), sputum production (white) and wheezing   Associated symptoms: no abdominal pain, no cough, no diaphoresis, no fever, no headaches, no hemoptysis, no rash, no sore throat and no vomiting   Risk factors: hx of PE/DVT (dvt)     Past Medical History  Diagnosis Date  . PSVT (paroxysmal supraventricular tachycardia) ([place])   . Hiatal hernia   . Hypertension   . Anxiety   . Asthma   . GERD (gastroesophageal reflux disease)   . Arthritis     SPINE  . TIA (transient ischemic attack)   . SVT (supraventricular tachycardia) ([place])   . Pancreatitis 02/2015   Past Surgical History  Procedure Laterality Date  . Cardiac electrophysiology mapping and ablation  2011  . Nissan fundoplication  [id number]  . Foot neuroma surgery    . Nasal septum surgery    . Tee without cardioversion  02/15/2012    Procedure: TRANSESOPHAGEAL ECHOCARDIOGRAM (TEE);  Surgeon: Laverda Page, MD;  Location: [facility] ENDOSCOPY;  Service: Cardiovascular;  Laterality: N/A;  . Abdominal hysterectomy  1996    BSO   Family History  Problem Relation Age of Onset  . Heart disease Mother 61    Heart failure >> death at age 95  . Dementia Mother   . Hypertension Mother   . Cancer Mother     Breast and colon  . Breast cancer  Mother 48  . Heart disease Father 42    MI  . COPD Father   . Heart disease Brother   . Mental illness Brother   . Cancer Brother 28    Lung and bone  . Cancer Brother     Lung and bone  . Cancer Brother     Colon   Social History  Substance Use Topics  . Smoking status: Never Smoker   . Smokeless tobacco: Never Used  . Alcohol Use: Yes     Comment: RARE   OB History    Gravida Para Term Preterm AB TAB SAB Ectopic Multiple Living   4 4  1      3      Review of Systems  Constitutional: Negative for fever, chills, diaphoresis, activity change, appetite change and fatigue.  HENT: Negative for facial swelling, sore throat, trouble swallowing and voice change.   Eyes: Negative for photophobia, pain and visual disturbance.  Respiratory: Positive for sputum production (white), shortness of breath and wheezing. Negative for cough, hemoptysis and stridor.   Cardiovascular: Positive for chest pain (obnly on deep breathing) and leg swelling.  Gastrointestinal: Negative for nausea, vomiting, abdominal pain, constipation and anal bleeding.  Endocrine: Negative.   Genitourinary: Negative for dysuria, vaginal bleeding, vaginal discharge and vaginal pain.  Musculoskeletal: Negative for myalgias, back pain and arthralgias.  Skin: Negative.  Negative for rash.  Allergic/Immunologic: Negative.   Neurological: Negative  for dizziness, tremors, syncope, weakness and headaches.  Psychiatric/Behavioral: Negative for suicidal ideas, sleep disturbance and self-injury.  All other systems reviewed and are negative.     Allergies  Review of patient's allergies indicates no known allergies.  Home Medications   Prior to Admission medications   Medication Sig Start Date End Date Taking? Authorizing Provider  acetaminophen (TYLENOL) 325 MG tablet Take 650 mg by mouth every 6 (six) hours as needed for mild pain.   Yes Historical Provider, MD  ibuprofen (ADVIL,MOTRIN) 200 MG tablet Take 400 mg by  mouth every 6 (six) hours as needed for moderate pain.   Yes Historical Provider, MD  ranitidine (ZANTAC) 75 MG tablet Take 75 mg by mouth 2 (two) times daily as needed for heartburn.    Yes Historical Provider, MD  traMADol (ULTRAM) 50 MG tablet Take 1 tablet (50 mg total) by mouth every 6 (six) hours as needed for moderate pain. 03/19/15  Yes Donne Hazel, MD  albuterol (PROVENTIL HFA;VENTOLIN HFA) 108 (90 Base) MCG/ACT inhaler Inhale 2 puffs into the lungs every 4 (four) hours as needed for wheezing or shortness of breath. Shortness of breath/wheezing 04/06/15   Margaretann Loveless, MD  albuterol (PROVENTIL) (2.5 MG/3ML) 0.083% nebulizer solution Take 6 mLs (5 mg total) by nebulization once. 04/06/15   Margaretann Loveless, MD  aspirin 81 MG chewable tablet Chew 1 tablet (81 mg total) by mouth daily. 12/22/11   Nolon Rod, DO  predniSONE (DELTASONE) 20 MG tablet Take 3 tablets (60 mg total) by mouth daily with breakfast. 04/07/15 04/10/15  Margaretann Loveless, MD   BP 107/77 mmHg  Pulse 91  Temp(Src) 98.7 F (37.1 C) (Oral)  Resp 18  Wt 70.308 kg  SpO2 97% Physical Exam  Constitutional: She is oriented to person, place, and time. She appears well-developed and well-nourished. No distress.  HENT:  Head: Normocephalic and atraumatic.  Right Ear: External ear normal.  Left Ear: External ear normal.  Mouth/Throat: Oropharynx is clear and moist. No oropharyngeal exudate.  Eyes: Conjunctivae and EOM are normal. Pupils are equal, round, and reactive to light. No scleral icterus.  Neck: Normal range of motion. Neck supple. No JVD present. No tracheal deviation present. No thyromegaly present.  Cardiovascular: Normal rate, regular rhythm and intact distal pulses.  Exam reveals no gallop and no friction rub.   No murmur heard. Pulmonary/Chest: Breath sounds normal. No respiratory distress (rachypnic to mid 20's but no frank respiratory distress). She has no wheezes (no wheezing on my exam (patient recieved albuterol just  prior to my exam)). She has no rales.  Abdominal: Soft. Bowel sounds are normal. She exhibits no distension. There is no tenderness.  Musculoskeletal: Normal range of motion. She exhibits no edema (no edema appreciated on exam) or tenderness.  Neurological: She is alert and oriented to person, place, and time. No cranial nerve deficit. She exhibits normal muscle tone. Coordination normal.  GCS 15  Skin: Skin is warm and dry. She is not diaphoretic. No pallor.  Psychiatric: She has a normal mood and affect. She expresses no homicidal and no suicidal ideation. She expresses no suicidal plans and no homicidal plans.  Nursing note and vitals reviewed.   ED Course  Procedures (including critical care time) Labs Review Labs Reviewed  BASIC METABOLIC PANEL - Abnormal; Notable for the following:    Glucose, Bld 143 (*)    All other components within normal limits  CBC  BRAIN NATRIURETIC PEPTIDE  LIPASE, BLOOD  I-STAT TROPOININ, ED  Imaging Review Dg Chest 2 View  04/06/2015  CLINICAL DATA:  X 5 days patient has been SOB, wheezing, reflux, cough when taking a deep breath. HX pneumonia 2008, stroke 2012, asthma. HTN EXAM: CHEST  2 VIEW COMPARISON:  03/17/2015 FINDINGS: The heart size and mediastinal contours are within normal limits. Both lungs are clear. The visualized skeletal structures are unremarkable. IMPRESSION: No active cardiopulmonary disease. Electronically Signed   By: Nolon Nations M.D.   On: 04/06/2015 16:30   Ct Angio Chest Pe W/cm &/or Wo Cm  04/06/2015  CLINICAL DATA:  Shortness of breath and cough since Wednesday. Wheezing. EXAM: CT ANGIOGRAPHY CHEST WITH CONTRAST TECHNIQUE: Multidetector CT imaging of the chest was performed using the standard protocol during bolus administration of intravenous contrast. Multiplanar CT image reconstructions and MIPs were obtained to evaluate the vascular anatomy. CONTRAST:  80mL OMNIPAQUE IOHEXOL 350 MG/ML SOLN COMPARISON:  12/25/2011  FINDINGS: Technically adequate study with good opacification of the central and segmental pulmonary arteries. No focal filling defects are demonstrated. No evidence of significant pulmonary embolus. Normal heart size. Normal caliber thoracic aorta. No aortic dissection. Great vessel origins are patent. Esophagus is decompressed. Small esophageal hiatal hernia. Postoperative changes at the EG junction. Scattered lymph nodes in the mediastinum, largest measuring about 10 mm short axis dimension. These are likely reactive although nonspecific. There is diffuse central and peripheral bronchial wall thickening with mucous plugging demonstrated in the lower lobe bronchi. Changes are likely to represent acute or chronic bronchitis versus reactive airways disease. No focal consolidation in the lungs. Mild patchy atelectasis in the lung bases. No pneumothorax. No pleural effusions. Included portions of the upper abdominal organs are grossly unremarkable. Degenerative changes in the spine. No destructive bone lesions. Review of the MIP images confirms the above findings. IMPRESSION: No evidence of significant pulmonary embolus. Bronchial wall thickening with mucous plugging suggesting acute or chronic bronchitis versus reactive airways disease. Electronically Signed   By: Lucienne Capers M.D.   On: 04/06/2015 19:41   I have personally reviewed and evaluated these images and lab results as part of my medical decision-making.   EKG Interpretation None      MDM   Final diagnoses:  SOB (shortness of breath)  Asthma attack   The patient is a 65 year old female with a history of asthm and history of previous left leg DVT. She presents with 4 days of cough and shortness of breath. Patient given breathing treatments with significant improvement in wheezing. Given history of pneumonia and DVT a CPAP study is obtained to evaluate for radio occult pneumonia not evident on xray or PE but is negative for acute pathology.  The patient likely has bronchitis-induced asthma exacerbation. She is discharged with a 5 day steroid burst and beta agonist. Patient is understanding and agreement with this plan and discharged ED return precautions.  Patient seen with attending, Dr. Reather Converse, who oversaw clinical decision making.     Margaretann Loveless, MD 04/06/15 2019  Elnora Morrison, MD 04/07/15 [phone]

## 2015-04-09 ENCOUNTER — Other Ambulatory Visit: Payer: Self-pay | Admitting: *Deleted

## 2015-04-09 ENCOUNTER — Telehealth: Payer: Self-pay | Admitting: *Deleted

## 2015-04-09 DIAGNOSIS — J45901 Unspecified asthma with (acute) exacerbation: Secondary | ICD-10-CM

## 2015-04-09 NOTE — Progress Notes (Signed)
Pt discharged 3/19 from [facility] ED with nebulizer meds but she does not have nebulizer machine at home.  ERCM obtained order so pt may pick up from [place].

## 2015-04-09 NOTE — Telephone Encounter (Signed)
Pharmacy called related to Rx: albuterol (PROVENTIL) (2.5 MG/3ML) 0.083% nebulizer solution 5 mg, Once  .Marland KitchenMarland KitchenEDCM clarified with PharmD for BID PRN.

## 2015-09-22 ENCOUNTER — Emergency Department: Payer: Medicare Other

## 2015-09-22 ENCOUNTER — Encounter: Payer: Self-pay | Admitting: Medical Oncology

## 2015-09-22 ENCOUNTER — Emergency Department
Admission: EM | Admit: 2015-09-22 | Discharge: 2015-09-22 | Disposition: A | Discharge status: Home / Self Care | Payer: Medicare Other | Attending: Emergency Medicine | Admitting: Emergency Medicine

## 2015-09-22 DIAGNOSIS — R1030 Lower abdominal pain, unspecified: Secondary | ICD-10-CM | POA: Diagnosis present

## 2015-09-22 DIAGNOSIS — N12 Tubulo-interstitial nephritis, not specified as acute or chronic: Secondary | ICD-10-CM | POA: Diagnosis not present

## 2015-09-22 DIAGNOSIS — I10 Essential (primary) hypertension: Secondary | ICD-10-CM | POA: Diagnosis not present

## 2015-09-22 DIAGNOSIS — Z8673 Personal history of transient ischemic attack (TIA), and cerebral infarction without residual deficits: Secondary | ICD-10-CM | POA: Diagnosis not present

## 2015-09-22 DIAGNOSIS — J45909 Unspecified asthma, uncomplicated: Secondary | ICD-10-CM | POA: Insufficient documentation

## 2015-09-22 LAB — URINALYSIS COMPLETE WITH MICROSCOPIC (ARMC ONLY)
BILIRUBIN URINE: NEGATIVE
Glucose, UA: NEGATIVE mg/dL
Ketones, ur: NEGATIVE mg/dL
NITRITE: POSITIVE — AB
PH: 5 (ref 5.0–8.0)
PROTEIN: 100 mg/dL — AB
Specific Gravity, Urine: 1.015 (ref 1.005–1.030)
Trans Epithel, UA: 1

## 2015-09-22 LAB — CBC WITH DIFFERENTIAL/PLATELET
BASOS ABS: 0.1 10*3/uL (ref 0–0.1)
BASOS PCT: 0 %
Eosinophils Absolute: 0 10*3/uL (ref 0–0.7)
Eosinophils Relative: 0 %
HEMATOCRIT: 47.6 % — AB (ref 35.0–47.0)
HEMOGLOBIN: 16 g/dL (ref 12.0–16.0)
Lymphocytes Relative: 10 %
Lymphs Abs: 1.4 10*3/uL (ref 1.0–3.6)
MCH: 31.2 pg (ref 26.0–34.0)
MCHC: 33.6 g/dL (ref 32.0–36.0)
MCV: 92.9 fL (ref 80.0–100.0)
Monocytes Absolute: 1.3 10*3/uL — ABNORMAL HIGH (ref 0.2–0.9)
Monocytes Relative: 10 %
NEUTROS ABS: 11.2 10*3/uL — AB (ref 1.4–6.5)
NEUTROS PCT: 80 %
Platelets: 213 10*3/uL (ref 150–440)
RBC: 5.13 MIL/uL (ref 3.80–5.20)
RDW: 13 % (ref 11.5–14.5)
WBC: 13.9 10*3/uL — AB (ref 3.6–11.0)

## 2015-09-22 LAB — LIPASE, BLOOD: Lipase: 22 U/L (ref 11–51)

## 2015-09-22 LAB — COMPREHENSIVE METABOLIC PANEL
ALBUMIN: 4 g/dL (ref 3.5–5.0)
ALK PHOS: 88 U/L (ref 38–126)
ALT: 15 U/L (ref 14–54)
AST: 24 U/L (ref 15–41)
Anion gap: 7 (ref 5–15)
BILIRUBIN TOTAL: 1.7 mg/dL — AB (ref 0.3–1.2)
BUN: 10 mg/dL (ref 6–20)
CALCIUM: 9 mg/dL (ref 8.9–10.3)
CO2: 27 mmol/L (ref 22–32)
Chloride: 100 mmol/L — ABNORMAL LOW (ref 101–111)
Creatinine, Ser: 0.99 mg/dL (ref 0.44–1.00)
GFR calc Af Amer: >60 mL/min (ref >60)
GFR calc non Af Amer: 58 mL/min — ABNORMAL LOW (ref >60)
GLUCOSE: 133 mg/dL — AB (ref 65–99)
Potassium: 3.4 mmol/L — ABNORMAL LOW (ref 3.5–5.1)
Sodium: 134 mmol/L — ABNORMAL LOW (ref 135–145)
TOTAL PROTEIN: 7.8 g/dL (ref 6.5–8.1)

## 2015-09-22 MED ORDER — IBUPROFEN 600 MG PO TABS: 600.0000 mg | ORAL_TABLET | Freq: Three times a day (TID) | ORAL | 0 refills | Status: DC | PRN

## 2015-09-22 MED ORDER — SODIUM CHLORIDE 0.9 % IV BOLUS (SEPSIS)
500.0000 mL | Freq: Once | INTRAVENOUS | Status: AC
  Administered 2015-09-22: 500 mL via INTRAVENOUS

## 2015-09-22 MED ORDER — KETOROLAC TROMETHAMINE 30 MG/ML IJ SOLN
15.0000 mg | Freq: Once | INTRAMUSCULAR | Status: AC
  Administered 2015-09-22: 15 mg via INTRAVENOUS

## 2015-09-22 MED ORDER — NITROFURANTOIN MACROCRYSTAL 100 MG PO CAPS: 100.0000 mg | ORAL_CAPSULE | Freq: Four times a day (QID) | ORAL | 0 refills | Status: AC

## 2015-09-22 MED ORDER — ONDANSETRON HCL 4 MG/2ML IJ SOLN
4.0000 mg | Freq: Once | INTRAMUSCULAR | Status: AC
  Administered 2015-09-22: 4 mg via INTRAVENOUS
  Filled 2015-09-22: qty 2

## 2015-09-22 MED ORDER — DEXTROSE 5 % IV SOLN
1.0000 g | Freq: Once | INTRAVENOUS | Status: AC
  Administered 2015-09-22: 1 g via INTRAVENOUS
  Filled 2015-09-22: qty 10

## 2015-09-22 MED ORDER — KETOROLAC TROMETHAMINE 60 MG/2ML IM SOLN
INTRAMUSCULAR | Status: AC
  Filled 2015-09-22: qty 2

## 2015-09-22 NOTE — ED Notes (Signed)
Patient transported to CT 

## 2015-09-22 NOTE — ED Provider Notes (Signed)
Time Seen: Approximately 1119  I have reviewed the triage notes  Chief Complaint: Back Pain and Urinary Tract Infection   History of Present Illness: Pamela Daniel is a 65 y.o. female who presents with a several day history of intermittent pain with urination and urinary frequency. She describes lower middle abdominal pain with radiation into both left and right lower quadrant and toward the back. She denies any objective fever but states that she's felt hot. She's had nausea with no persistent vomiting. She denies any loose stool or diarrhea. She describes a mildly pleuritic component to her discomfort. She is postmenopausal and denies any vaginal discharge or bleeding. She is been taking over-the-counter Azo for pain.   Past Medical History:  Diagnosis Date  . Anxiety   . Arthritis    SPINE  . Asthma   . GERD (gastroesophageal reflux disease)   . Hiatal hernia   . Hypertension   . Pancreatitis 02/2015  . PSVT (paroxysmal supraventricular tachycardia) ([place])   . SVT (supraventricular tachycardia) ([place])   . TIA (transient ischemic attack)     Patient Active Problem List   Diagnosis Date Noted  . Acute pancreatitis   . Cough   . Pancreatitis, acute   . Epigastric abdominal pain   . Pancreatitis 03/16/2015  . VAIN I (vaginal intraepithelial neoplasia grade I) 04/17/2014  . TIA (transient ischemic attack) 12/22/2011  . SVT/ PSVT/ PAT 01/13/2010  . RECTAL BLEEDING 08/01/2007  . DIARRHEA 08/01/2007  . ESOPHAGEAL STRICTURE 07/31/2007  . HIATAL HERNIA 07/31/2007  . COLONIC POLYPS, HX OF 07/31/2007  . HYPERTENSION 03/14/2007  . G E R D 03/14/2007    Past Surgical History:  Procedure Laterality Date  . ABDOMINAL HYSTERECTOMY  1996   BSO  . CARDIAC ELECTROPHYSIOLOGY [place] AND ABLATION  2011  . FOOT NEUROMA SURGERY    . NASAL SEPTUM SURGERY    . nissan fundoplication  [id number]  . TEE WITHOUT CARDIOVERSION  02/15/2012   Procedure: TRANSESOPHAGEAL ECHOCARDIOGRAM (TEE);   Surgeon: Laverda Page, MD;  Location: [facility];  Service: Cardiovascular;  Laterality: N/A;    Past Surgical History:  Procedure Laterality Date  . ABDOMINAL HYSTERECTOMY  1996   BSO  . CARDIAC ELECTROPHYSIOLOGY [place] AND ABLATION  2011  . FOOT NEUROMA SURGERY    . NASAL SEPTUM SURGERY    . nissan fundoplication  [id number]  . TEE WITHOUT CARDIOVERSION  02/15/2012   Procedure: TRANSESOPHAGEAL ECHOCARDIOGRAM (TEE);  Surgeon: Laverda Page, MD;  Location: [facility];  Service: Cardiovascular;  Laterality: N/A;    Current Outpatient Rx  . Order #: [id number] Class: Historical Med  . Order #: [id number] Class: Print  . Order #: [id number] Class: Print  . Order #: [id number] Class: Normal  . Order #: [id number] Class: Historical Med  . Order #: [id number] Class: Historical Med  . Order #: [id number] Class: No Print    Allergies:  Review of patient's allergies indicates no known allergies.  Family History: Family History  Problem Relation Age of Onset  . Heart disease Mother 64    Heart failure >> death at age 62  . Dementia Mother   . Hypertension Mother   . Cancer Mother     Breast and colon  . Breast cancer Mother 73  . Heart disease Father 29    MI  . COPD Father   . Heart disease Brother   . Mental illness Brother   . Cancer Brother 42    Lung and bone  . Cancer Brother  Lung and bone  . Cancer Brother     Colon    Social History: Social History  Substance Use Topics  . Smoking status: Never Smoker  . Smokeless tobacco: Never Used  . Alcohol use Yes     Comment: RARE     Review of Systems:   10 point review of systems was performed and was otherwise negative:  Constitutional: No fever Eyes: No visual disturbances ENT: No sore throat, ear pain Cardiac: No chest pain Respiratory: No shortness of breath, wheezing, or stridor Abdomen: Lower middle quadrant with nausea no vomiting Endocrine: No weight loss, No night sweats Extremities: No peripheral  edema, cyanosis Skin: No rashes, easy bruising Neurologic: No focal weakness, trouble with speech or swollowing Urologic: She describes burning and urinary frequency. She states she may have had a small amount of blood in her urine today   Physical Exam:  ED Triage Vitals  Enc Vitals Group     BP 09/22/15 1048 (!) 141/87     Pulse Rate 09/22/15 1048 87     Resp 09/22/15 1048 18     Temp 09/22/15 1048 98.2 F (36.8 C)     Temp Source 09/22/15 1048 Oral     SpO2 09/22/15 1048 97 %     Weight 09/22/15 1049 167 lb (75.8 kg)     Height 09/22/15 1049 5' (1.524 m)     Head Circumference --      Peak Flow --      Pain Score 09/22/15 1049 7     Pain Loc --      Pain Edu? --      Excl. in [place]? --     General: Awake , Alert , and Oriented times 3; GCS 15 Head: Normal cephalic , atraumatic Eyes: Pupils equal , round, reactive to light Nose/Throat: No nasal drainage, patent upper airway without erythema or exudate.  Neck: Supple, Full range of motion, No anterior adenopathy or palpable thyroid masses Lungs: Clear to ascultation without wheezes , rhonchi, or rales Heart: Regular rate, regular rhythm without murmurs , gallops , or rubs Abdomen: Mild tenderness to deep palpation across the lower middle and toward both lower quadrants without rebound, guarding, or rigidity. No obvious peritoneal signs. Bowel sounds are positive and symmetric in all 4 quadrants. Negative Murphy's sign. No focal tenderness over McBurney's point. No organosplenomegaly       Extremities: 2 plus symmetric pulses. No edema, clubbing or cyanosis Neurologic: normal ambulation, Motor symmetric without deficits, sensory intact Skin: warm, dry, no rashes   Labs:   All laboratory work was reviewed including any pertinent negatives or positives listed below:  Labs Reviewed  [facility] ([place])  CBC WITH DIFFERENTIAL/PLATELET  COMPREHENSIVE METABOLIC PANEL  LIPASE, BLOOD  Reviewed the  patient's laboratory work shows what appears to be urinary tract infection with elevated white blood cell count, etc. Urine culture is pending  Radiology:   "Ct Renal Stone Study  Result Date: 09/22/2015 CLINICAL DATA:  65 year old female with bilateral flank and abdominal/pelvic pain for 3 weeks. EXAM: CT ABDOMEN AND PELVIS WITHOUT CONTRAST TECHNIQUE: Multidetector CT imaging of the abdomen and pelvis was performed following the standard protocol without IV contrast. COMPARISON:  03/16/2015 CT FINDINGS: Please note that parenchymal abnormalities may be missed without intravenous contrast. Lower chest: No acute abnormality. A small hiatal hernia is present. Surgical changes at the EG junction noted. Hepatobiliary: The liver and gallbladder are unremarkable. There is no evidence of biliary dilatation. Pancreas:  Unremarkable Spleen: Unremarkable Adrenals/Urinary Tract: Right perinephric stranding is compatible with renal infection or nonspecific inflammation. There is no evidence of hydronephrosis or obstructing urinary calculi. Bilateral renal cysts and a punctate nonobstructing right upper pole renal calculus again identified. The adrenal glands and bladder are unremarkable. Stomach/Bowel: No bowel obstruction or definite bowel wall thickening noted. The appendix is normal. Vascular/Lymphatic: Abdominal aortic atherosclerotic calcifications noted without aneurysm. No enlarged lymph nodes identified. Reproductive: Patient is status post hysterectomy. No adnexal masses noted. Other: No free fluid, focal collection or pneumoperitoneum. Musculoskeletal: No acute or suspicious abnormalities identified. Grade 1 anterolisthesis of L5 on S1 is unchanged. IMPRESSION: Nonspecific right perinephric stranding compatible with renal infection or inflammation. No evidence of hydronephrosis or obstructing urinary calculi. Punctate nonobstructing right upper pole renal calculus. Small hiatal hernia. Abdominal aortic  atherosclerosis. Electronically Signed   By: Margarette Canada M.D.   On: 09/22/2015 12:12  "     I personally reviewed the radiologic studies    ED Course:  Given the patient's current clinical presentation and objective findings I felt most likely she had acute pyelonephritis partially based on exam and CAT scan evaluation. Patient was started on IV Rocephin and currently has a urine culture pending. Tolerated fluids and I felt could be treated on an outpatient basis. She was prescribed Macrodantin and with again urine culture pending.   Clinical Course     Assessment: Acute pyelonephritis   Final Clinical Impression:   Final diagnoses:  Lower abdominal pain     Plan:  Outpatient " Discharge Medication List as of 09/22/2015  1:32 PM    START taking these medications   Details  nitrofurantoin (MACRODANTIN) 100 MG capsule Take 1 capsule (100 mg total) by mouth 4 (four) times daily., Starting Mon 09/22/2015, Until Mon 09/29/2015, Print      " Patient was advised to return immediately if condition worsens. Patient was advised to follow up with their primary care physician or other specialized physicians involved in their outpatient care. The patient and/or family member/power of attorney had laboratory results reviewed at the bedside. All questions and concerns were addressed and appropriate discharge instructions were distributed by the nursing staff.            Daymon Larsen, MD 09/22/15 [phone]

## 2015-09-22 NOTE — ED Notes (Signed)

## 2015-09-22 NOTE — ED Triage Notes (Signed)
Pt ambulatory with reports of urinary sx's that began 3 weeks ago and now has progressed into bilt flank pain. Pt reports she has had fevers off and on also.

## 2015-09-24 LAB — URINE CULTURE

## 2016-06-02 ENCOUNTER — Encounter: Payer: Self-pay | Admitting: Gynecology

## 2016-06-30 ENCOUNTER — Encounter (HOSPITAL_COMMUNITY): Payer: Self-pay | Admitting: Emergency Medicine

## 2016-06-30 ENCOUNTER — Emergency Department (HOSPITAL_COMMUNITY): Payer: Medicare HMO

## 2016-06-30 DIAGNOSIS — Z7982 Long term (current) use of aspirin: Secondary | ICD-10-CM | POA: Diagnosis not present

## 2016-06-30 DIAGNOSIS — R079 Chest pain, unspecified: Secondary | ICD-10-CM | POA: Diagnosis not present

## 2016-06-30 DIAGNOSIS — R42 Dizziness and giddiness: Secondary | ICD-10-CM | POA: Insufficient documentation

## 2016-06-30 DIAGNOSIS — R002 Palpitations: Secondary | ICD-10-CM | POA: Diagnosis not present

## 2016-06-30 DIAGNOSIS — J45909 Unspecified asthma, uncomplicated: Secondary | ICD-10-CM | POA: Insufficient documentation

## 2016-06-30 DIAGNOSIS — R0602 Shortness of breath: Secondary | ICD-10-CM | POA: Diagnosis not present

## 2016-06-30 DIAGNOSIS — I10 Essential (primary) hypertension: Secondary | ICD-10-CM | POA: Insufficient documentation

## 2016-06-30 DIAGNOSIS — R0789 Other chest pain: Secondary | ICD-10-CM | POA: Diagnosis not present

## 2016-06-30 LAB — CBC
HEMATOCRIT: 46.5 % — AB (ref 36.0–46.0)
HEMOGLOBIN: 15 g/dL (ref 12.0–15.0)
MCH: 30.7 pg (ref 26.0–34.0)
MCHC: 32.3 g/dL (ref 30.0–36.0)
MCV: 95.1 fL (ref 78.0–100.0)
Platelets: 255 10*3/uL (ref 150–400)
RBC: 4.89 MIL/uL (ref 3.87–5.11)
RDW: 12.9 % (ref 11.5–15.5)
WBC: 7.6 10*3/uL (ref 4.0–10.5)

## 2016-06-30 LAB — URINALYSIS, ROUTINE W REFLEX MICROSCOPIC
BILIRUBIN URINE: NEGATIVE
Bacteria, UA: NONE SEEN
Glucose, UA: NEGATIVE mg/dL
HGB URINE DIPSTICK: NEGATIVE
Ketones, ur: NEGATIVE mg/dL
NITRITE: NEGATIVE
PH: 8 (ref 5.0–8.0)
Protein, ur: NEGATIVE mg/dL
SPECIFIC GRAVITY, URINE: 1.001 — AB (ref 1.005–1.030)
Squamous Epithelial / LPF: NONE SEEN

## 2016-06-30 LAB — BASIC METABOLIC PANEL
Anion gap: 6 (ref 5–15)
BUN: 8 mg/dL (ref 6–20)
CALCIUM: 9.4 mg/dL (ref 8.9–10.3)
CO2: 29 mmol/L (ref 22–32)
Chloride: 105 mmol/L (ref 101–111)
Creatinine, Ser: 0.77 mg/dL (ref 0.44–1.00)
Glucose, Bld: 98 mg/dL (ref 65–99)
POTASSIUM: 4 mmol/L (ref 3.5–5.1)
Sodium: 140 mmol/L (ref 135–145)

## 2016-06-30 LAB — CBG MONITORING, ED: Glucose-Capillary: 124 mg/dL — ABNORMAL HIGH (ref 65–99)

## 2016-06-30 LAB — I-STAT TROPONIN, ED: TROPONIN I, POC: 0 ng/mL (ref 0.00–0.08)

## 2016-06-30 NOTE — ED Triage Notes (Signed)
Pt presents from church where she began feeling palpitations, sob, weakness, and nausea; sat down and the feeling passed; pt also states she has hx of PSVT with ablation, pt also reporting bloody mucous from rectum

## 2016-07-01 ENCOUNTER — Emergency Department (HOSPITAL_COMMUNITY)
Admission: EM | Admit: 2016-07-01 | Discharge: 2016-07-01 | Disposition: A | Discharge status: Home / Self Care | Payer: Medicare HMO | Attending: Emergency Medicine | Admitting: Emergency Medicine

## 2016-07-01 DIAGNOSIS — R002 Palpitations: Secondary | ICD-10-CM

## 2016-07-01 DIAGNOSIS — R42 Dizziness and giddiness: Secondary | ICD-10-CM

## 2016-07-01 DIAGNOSIS — R079 Chest pain, unspecified: Secondary | ICD-10-CM

## 2016-07-01 DIAGNOSIS — R0602 Shortness of breath: Secondary | ICD-10-CM

## 2016-07-01 LAB — I-STAT TROPONIN, ED: Troponin i, poc: 0 ng/mL (ref 0.00–0.08)

## 2016-07-01 NOTE — ED Notes (Signed)
Pt departed in NAD.  

## 2016-07-01 NOTE — ED Notes (Signed)
The pt had tachycardia and almost passed out at church 1730 tonight  She is c.o weakness now.  Her daughter reports that her speech was  Slow and it took her longer to answer questions.  Apparently she has had these episodes for years and she has had an ablation in the past for the same.  She has had 2 and 3 per year  At present she has  Some chest heaviness   Nauseated no vomiting.  Vitals good at present  She has had high bp for a few days  Her heart rate slowed down at 2030 on the way here

## 2016-07-01 NOTE — ED Provider Notes (Signed)
[facility] DEPT Provider Note   CSN: [id number] Arrival date & time: 06/30/16  2100  By signing my name below, I, Jeanell Sparrow, attest that this documentation has been prepared under the direction and in the presence of Gareth Morgan, MD. Electronically Signed: Jeanell Sparrow, Scribe. 07/01/2016. 3:49 AM.  History   Chief Complaint Chief Complaint  Patient presents with  . Palpitations  . Weakness  . Nausea   The history is provided by the patient and a relative. No language interpreter was used.   HPI Comments: Pamela Daniel is a 66 y.o. female with a PMHx of anxiety, tachycardia, PSVT, and SVT, who presents to the Emergency Department complaining of heart palpitations that started about 10 hours ago. She was standing at church and had a a sudden onset of symptoms before she almost passed out. No LOC. Her symptoms were nausea, lightheadedness, chest heaviness (currently 6/10), arm pressure, and increased heart rate. She came home and "was out of it" with associated headache (currently 4/10), generalized weakness, and fatigue. She also has noticed bilateral ankle swelling recently. Denies any hx of trauma, smoking, decreased appetite, fever, chills, nausea, vomiting, blood in stool, melena, visual disturbance, unilateral numbness/weakness, or other complaints at this time.  Past Medical History:  Diagnosis Date  . Anxiety   . Arthritis    SPINE  . Asthma   . GERD (gastroesophageal reflux disease)   . Hiatal hernia   . Hypertension   . Pancreatitis 02/2015  . PSVT (paroxysmal supraventricular tachycardia) ([place])   . SVT (supraventricular tachycardia) ([place])   . TIA (transient ischemic attack)     Patient Active Problem List   Diagnosis Date Noted  . Acute pancreatitis   . Cough   . Pancreatitis, acute   . Epigastric abdominal pain   . Pancreatitis 03/16/2015  . VAIN I (vaginal intraepithelial neoplasia grade I) 04/17/2014  . TIA (transient ischemic attack) 12/22/2011   . SVT/ PSVT/ PAT 01/13/2010  . RECTAL BLEEDING 08/01/2007  . DIARRHEA 08/01/2007  . ESOPHAGEAL STRICTURE 07/31/2007  . HIATAL HERNIA 07/31/2007  . COLONIC POLYPS, HX OF 07/31/2007  . HYPERTENSION 03/14/2007  . G E R D 03/14/2007    Past Surgical History:  Procedure Laterality Date  . ABDOMINAL HYSTERECTOMY  1996   BSO  . CARDIAC ELECTROPHYSIOLOGY [place] AND ABLATION  2011  . FOOT NEUROMA SURGERY    . NASAL SEPTUM SURGERY    . nissan fundoplication  [id number]  . TEE WITHOUT CARDIOVERSION  02/15/2012   Procedure: TRANSESOPHAGEAL ECHOCARDIOGRAM (TEE);  Surgeon: Laverda Page, MD;  Location: [facility];  Service: Cardiovascular;  Laterality: N/A;    OB History    Gravida Para Term Preterm AB Living   4 4   1   3    SAB TAB Ectopic Multiple Live Births                   Home Medications    Prior to Admission medications   Medication Sig Start Date End Date Taking? Authorizing Provider  acetaminophen (TYLENOL) 325 MG tablet Take 650 mg by mouth every 6 (six) hours as needed for mild pain.    [provider]  albuterol (PROVENTIL HFA;VENTOLIN HFA) 108 (90 Base) MCG/ACT inhaler Inhale 2 puffs into the lungs every 4 (four) hours as needed for wheezing or shortness of breath. Shortness of breath/wheezing 04/06/15   Margaretann Loveless, MD  albuterol (PROVENTIL) (2.5 MG/3ML) 0.083% nebulizer solution Take 6 mLs (5 mg total) by nebulization once. 04/06/15  Margaretann Loveless, MD  aspirin 81 MG chewable tablet Chew 1 tablet (81 mg total) by mouth daily. 12/22/11   Hess, Tamela Oddi, DO  ibuprofen (ADVIL,MOTRIN) 600 MG tablet Take 1 tablet (600 mg total) by mouth every 8 (eight) hours as needed for fever or cramping. 09/22/15   Daymon Larsen, MD  ranitidine (ZANTAC) 75 MG tablet Take 75 mg by mouth 2 (two) times daily as needed for heartburn.     [provider]  traMADol (ULTRAM) 50 MG tablet Take 1 tablet (50 mg total) by mouth every 6 (six) hours as needed for moderate pain. 03/19/15    Donne Hazel, MD    Family History Family History  Problem Relation Age of Onset  . Heart disease Mother 57       Heart failure >> death at age 60  . Dementia Mother   . Hypertension Mother   . Cancer Mother        Breast and colon  . Breast cancer Mother 36  . Heart disease Father 76       MI  . COPD Father   . Heart disease Brother   . Mental illness Brother   . Cancer Brother 32       Lung and bone  . Cancer Brother        Lung and bone  . Cancer Brother        Colon    Social History Social History  Substance Use Topics  . Smoking status: Never Smoker  . Smokeless tobacco: Never Used  . Alcohol use Yes     Comment: RARE     Allergies   Patient has no known allergies.   Review of Systems Review of Systems  Constitutional: Positive for fatigue. Negative for appetite change, chills and fever.  HENT: Negative for sore throat.   Eyes: Negative for visual disturbance.  Respiratory: Negative for cough and shortness of breath.   Cardiovascular: Positive for chest pain, palpitations and leg swelling.  Gastrointestinal: Positive for anal bleeding (blood and mucus). Negative for abdominal pain, blood in stool, nausea and vomiting.  Genitourinary: Negative for difficulty urinating and dysuria.  Musculoskeletal: Negative for back pain and neck pain.  Skin: Negative for rash.  Neurological: Positive for weakness (Generalized), light-headedness and headaches. Negative for syncope, facial asymmetry, speech difficulty and numbness.     Physical Exam Updated Vital Signs BP (!) 151/70 (BP Location: Right Arm)   Pulse 65   Temp 97.6 F (36.4 C) (Oral)   Resp 18   Ht 5\' 1"  (1.549 m)   Wt 160 lb (72.6 kg)   SpO2 99%   BMI 30.23 kg/m   Physical Exam  Constitutional: She is oriented to person, place, and time. She appears well-developed and well-nourished. No distress.  HENT:  Head: Normocephalic and atraumatic.  Eyes: Conjunctivae and EOM are normal.  Neck:  Normal range of motion. Neck supple.  Cardiovascular: Normal rate, regular rhythm, normal heart sounds and intact distal pulses.  Exam reveals no gallop and no friction rub.   No murmur heard. Pulmonary/Chest: Effort normal and breath sounds normal. No respiratory distress. She has no wheezes. She has no rales.  Abdominal: Soft. She exhibits no distension. There is no tenderness. There is no guarding.  Musculoskeletal: Normal range of motion. She exhibits no edema or tenderness.  Neurological: She is alert and oriented to person, place, and time. She has normal strength. No cranial nerve deficit or sensory deficit. Coordination normal. GCS  eye subscore is 4. GCS verbal subscore is 5. GCS motor subscore is 6.  Skin: Skin is warm and dry. No rash noted. She is not diaphoretic. No erythema.  Psychiatric: She has a normal mood and affect.  Nursing note and vitals reviewed.    ED Treatments / Results  DIAGNOSTIC STUDIES: Oxygen Saturation is 99% on RA, normal by my interpretation.    COORDINATION OF CARE: 3:53 AM- Pt advised of plan for treatment and pt agrees.  Labs (all labs ordered are listed, but only abnormal results are displayed) Labs Reviewed  CBC - Abnormal; Notable for the following:       Result Value   HCT 46.5 (*)    All other components within normal limits  URINALYSIS, ROUTINE W REFLEX MICROSCOPIC - Abnormal; Notable for the following:    Color, Urine COLORLESS (*)    Specific Gravity, Urine 1.001 (*)    Leukocytes, UA TRACE (*)    All other components within normal limits  CBG MONITORING, ED - Abnormal; Notable for the following:    Glucose-Capillary 124 (*)    All other components within normal limits  BASIC METABOLIC PANEL  I-STAT TROPOININ, ED  I-STAT TROPOININ, ED    EKG  EKG Interpretation  Date/Time:  Wednesday June 30 2016 21:12:19 EDT Ventricular Rate:  67 PR Interval:  238 QRS Duration: 74 QT Interval:  404 QTC Calculation: 426 R Axis:   26 Text  Interpretation:  Sinus rhythm with 1st degree A-V block Low voltage QRS Borderline ECG No significant change since last tracing Confirmed by Gareth Morgan [phone]) on 07/01/2016 2:57:24 AM Also confirmed by Gareth Morgan [phone]), editor Hattie Perch ([id number])  on 07/01/2016 7:13:41 AM       Radiology Dg Chest 2 View  Result Date: 06/30/2016 CLINICAL DATA:  Palpitations, shortness of breath, weakness, and nausea. EXAM: CHEST  2 VIEW COMPARISON:  Chest CT 04/06/2015 FINDINGS: The heart size and mediastinal contours are within normal limits. Both lungs are clear. The visualized skeletal structures are unremarkable. IMPRESSION: No active cardiopulmonary disease. Electronically Signed   By: Van Clines M.D.   On: 06/30/2016 21:32    Procedures Procedures (including critical care time)  Medications Ordered in ED Medications - No data to display   Initial Impression / Assessment and Plan / ED Course  I have reviewed the triage vital signs and the nursing notes.  Pertinent labs & imaging results that were available during my care of the patient were reviewed by me and considered in my medical decision making (see chart for details).     66yo female with history of PSVT, hypertension, presents with concern for episode of palpitations, lightheadedness, chest heaviness, dyspnea, headache, fatigue.  EKG without significant change since prior.  Delta troponin negative, patient chest pain free, doubt ACS. Slow onset of headache, doubt SAH. No hx of trauma, doubt SDH. Neurologic exam normal. Pt describes generalized fatigue, with associated mental slowing per family but no focal neurologic deficits described, hx not consistent with aphasia or dysarthria.  Other labs WNL. NO asymmetric leg swelling, no hx of PE, no continuing dyspnea, doubt PE. Given heart racing, pt may have been having episode of SVT or other. Discussed that it is appropriate to observe her in the hospital given episode of  chest heaviness, however given no current CP, association with several other symptoms including palpitations, no prior CP or exertional symptoms, it is reasonable to follow up with her Cardiologist as an outpatient. Pt would like outpatient  follow up. Also recommend follow up for concern of bright red blood/mucus stools. Hx not consistent with acute clinically sig GI bleed, hgb normal. Patient discharged in stable condition with understanding of reasons to return.   Final Clinical Impressions(s) / ED Diagnoses   Final diagnoses:  Palpitations  Shortness of breath  Chest pain, unspecified type  Lightheadedness    New Prescriptions Discharge Medication List as of 07/01/2016  6:14 AM     I personally performed the services described in this documentation, which was scribed in my presence. The recorded information has been reviewed and is accurate.     Gareth Morgan, MD 07/01/16 [phone]

## 2016-09-08 DIAGNOSIS — I471 Supraventricular tachycardia: Secondary | ICD-10-CM | POA: Diagnosis not present

## 2016-09-08 DIAGNOSIS — K625 Hemorrhage of anus and rectum: Secondary | ICD-10-CM | POA: Diagnosis not present

## 2016-09-08 DIAGNOSIS — I10 Essential (primary) hypertension: Secondary | ICD-10-CM | POA: Diagnosis not present

## 2016-09-08 DIAGNOSIS — J45998 Other asthma: Secondary | ICD-10-CM | POA: Diagnosis not present

## 2016-09-08 DIAGNOSIS — M545 Low back pain: Secondary | ICD-10-CM | POA: Diagnosis not present

## 2016-09-08 DIAGNOSIS — L989 Disorder of the skin and subcutaneous tissue, unspecified: Secondary | ICD-10-CM | POA: Diagnosis not present

## 2016-09-08 DIAGNOSIS — R0789 Other chest pain: Secondary | ICD-10-CM | POA: Diagnosis not present

## 2016-09-08 DIAGNOSIS — K219 Gastro-esophageal reflux disease without esophagitis: Secondary | ICD-10-CM | POA: Diagnosis not present

## 2016-09-08 DIAGNOSIS — F418 Other specified anxiety disorders: Secondary | ICD-10-CM | POA: Diagnosis not present

## 2016-09-09 ENCOUNTER — Other Ambulatory Visit: Payer: Self-pay | Admitting: Internal Medicine

## 2016-09-09 DIAGNOSIS — K625 Hemorrhage of anus and rectum: Secondary | ICD-10-CM

## 2016-09-10 ENCOUNTER — Ambulatory Visit
Admission: RE | Admit: 2016-09-10 | Discharge: 2016-09-10 | Disposition: A | Discharge status: Home / Self Care | Payer: Medicare HMO | Source: Ambulatory Visit | Attending: Internal Medicine | Admitting: Internal Medicine

## 2016-09-10 DIAGNOSIS — K625 Hemorrhage of anus and rectum: Secondary | ICD-10-CM

## 2016-09-10 MED ORDER — IOPAMIDOL (ISOVUE-300) INJECTION 61%
100.0000 mL | Freq: Once | INTRAVENOUS | Status: AC | PRN
  Administered 2016-09-10: 100 mL via INTRAVENOUS

## 2016-09-13 ENCOUNTER — Other Ambulatory Visit: Payer: Self-pay | Admitting: Internal Medicine

## 2016-09-13 DIAGNOSIS — N2889 Other specified disorders of kidney and ureter: Secondary | ICD-10-CM

## 2016-09-14 ENCOUNTER — Encounter: Payer: Self-pay | Admitting: Gastroenterology

## 2016-09-14 ENCOUNTER — Ambulatory Visit (INDEPENDENT_AMBULATORY_CARE_PROVIDER_SITE_OTHER): Payer: Medicare HMO | Admitting: Gastroenterology

## 2016-09-14 VITALS — BP 128/70 | HR 71 | Ht 60.05 in | Wt 163.0 lb

## 2016-09-14 DIAGNOSIS — K625 Hemorrhage of anus and rectum: Secondary | ICD-10-CM

## 2016-09-14 DIAGNOSIS — K642 Third degree hemorrhoids: Secondary | ICD-10-CM

## 2016-09-14 NOTE — Patient Instructions (Signed)
If you are age 67 or older, your body mass index should be between 23-30. Your Body mass index is 31.78 kg/m. If this is out of the aforementioned range listed, please consider follow up with your Primary Care Provider.  If you are age 49 or younger, your body mass index should be between 19-25. Your Body mass index is 31.78 kg/m. If this is out of the aformentioned range listed, please consider follow up with your Primary Care Provider.   We will send your records to [facility] Dr A. Thomas.  Thank you for choosing [facility]  Dr Wilfrid Lund III

## 2016-09-14 NOTE — Progress Notes (Signed)
     [facility] Progress Note  Chief Complaint: Rectal bleeding  Subjective  History:  This is a 66 year old woman last seen by our PA in March 2016 for a family history of colon cancer. Two tubular adenomas and at least 2 hyperplastic polyps were found, Dr. Deatra Ina planned a 5 year recall. Grade 3 internal hemorrhoids were found at that point as well. Pamela Daniel reports that for at least the last 6 months she has had more frequent hemorrhoid symptoms with prolapse, burning and bleeding. It is causing some fecal seepage with embarrassment. When the hemorrhoids act up she feels that it gives her constipation. Generally she does not sit for very long or strain for bowel movements. She denies abdominal pain, sometimes gets rectal pressure or heaviness before bowel movements. Tends to occur when hemorrhoids are acting up. She denies upper GI symptoms.  ROS: Cardiovascular:  no chest pain Respiratory: no dyspnea Remainder systems are negative except as above The patient's Past Medical, Family and Social History were reviewed and are on file in the EMR.  Objective:  Med list reviewed  Vital signs in last 24 hrs: Vitals:   09/14/16 1526  BP: 128/70  Pulse: 71    Physical Exam    HEENT: sclera anicteric, oral mucosa moist without lesions  Neck: supple, no thyromegaly, JVD or lymphadenopathy  Cardiac: RRR without murmurs, S1S2 heard, no peripheral edema  Pulm: clear to auscultation bilaterally, normal RR and effort noted  Abdomen: soft, No tenderness, with active bowel sounds. No guarding or palpable hepatosplenomegaly.  Skin; warm and dry, no jaundice or rash Exam chaperoned by our MA, Magdalene River Rectal: On visual inspection, there is a grade 3 hemorrhoid, prolapse but reducible. It is inflamed. Digital exam reveals no fissure or posterior tenderness, but the anterior prolapsed hemorrhoid is tender. Anoscopy reveals 3 columns of hemorrhoids, the culprit lesion appears to be  the right anterior column.  Radiologic studies:  Recent CTAP (done because of the bleeding)- no fistula, renal cysts and 1cm mass, MRI pending, sig tics There was apparent concern about possible rectovaginal fistula since she describes the blood as being dark at times.  @ASSESSMENTPLANBEGIN @ Assessment: Encounter Diagnoses  Name Primary?  . RECTAL BLEEDING Yes  . Prolapsed internal hemorrhoids, grade 3    She has symptomatic hemorrhoids, but I am not certain that this grade 3 hemorrhoid is amenable to banding.   Plan:  Colorectal surgery evaluation with Dr. Marcello Moores.  Total time 20 minutes, over half spent in counseling and coordination of care.  Anoscopy is a separately billable procedure  Nelida Meuse III

## 2016-09-21 ENCOUNTER — Ambulatory Visit
Admission: RE | Admit: 2016-09-21 | Discharge: 2016-09-21 | Disposition: A | Discharge status: Home / Self Care | Payer: Medicare HMO | Source: Ambulatory Visit | Attending: Internal Medicine | Admitting: Internal Medicine

## 2016-09-21 ENCOUNTER — Telehealth: Payer: Self-pay | Admitting: Gastroenterology

## 2016-09-21 DIAGNOSIS — N2889 Other specified disorders of kidney and ureter: Secondary | ICD-10-CM

## 2016-09-21 DIAGNOSIS — K760 Fatty (change of) liver, not elsewhere classified: Secondary | ICD-10-CM | POA: Diagnosis not present

## 2016-09-21 MED ORDER — GADOBENATE DIMEGLUMINE 529 MG/ML IV SOLN
15.0000 mL | Freq: Once | INTRAVENOUS | Status: AC | PRN
  Administered 2016-09-21: 15 mL via INTRAVENOUS

## 2016-09-21 NOTE — Telephone Encounter (Signed)
Records faxed on 09-15-2016. Called to check if appointment has been made. Left a voicemail for Judson Roch the referral coordinator. Re-faxed referral information.

## 2016-09-21 NOTE — Telephone Encounter (Signed)
Routed to Selby T., have you heard anything from [facility]?

## 2016-09-21 NOTE — Telephone Encounter (Signed)
Pt is scheduled to see Dr Marcello Moores at [place] on 10-15-2016 @ 9 am. Pt is aware.

## 2016-10-15 ENCOUNTER — Other Ambulatory Visit: Payer: Self-pay | Admitting: General Surgery

## 2016-10-15 DIAGNOSIS — K642 Third degree hemorrhoids: Secondary | ICD-10-CM | POA: Diagnosis not present

## 2016-10-15 NOTE — H&P (Signed)
History of Present Illness Leighton Ruff MD; 1/32/4401 9:29 AM) The patient is a 66 year old female who presents with a complaint of Rectal bleeding. 66 year old female who presents to the office for evaluation of rectal bleeding. Patient has a family history of colon cancer and is up-to-date on her colonoscopies. She states that she has bleeding with every bowel movement and a prolapsing hemorrhoid that she has to reduce after bowel movements. On certain days her bleeding can be quite severe. She reports regular bowel habits and denies any problems with straining or constipation unless her hemorrhoids are swollen and irritated. She is not on any anticoagulation. She has tried Anusol suppositories and over-the-counter hemorrhoid medications with no change in her symptoms.   Past Surgical History Mammie Lorenzo, LPN; 0/27/2536 6:44 AM) Anal Fissure Repair Colon Polyp Removal - Colonoscopy Foot Surgery Left. Hysterectomy (not due to cancer) - Complete Nissen Fundoplication  Diagnostic Studies History Mammie Lorenzo, LPN; 0/34/7425 9:56 AM) Colonoscopy 1-5 years ago Mammogram >3 years ago Pap Smear 1-5 years ago  Allergies Mammie Lorenzo, LPN; 3/87/5643 3:29 AM) No Known Drug Allergies 10/15/2016 Allergies Reconciled  Medication History Mammie Lorenzo, LPN; 06/05/8414 6:06 AM) ALPRAZolam (0.25MG  Tablet, Oral) Active. Albuterol (90MCG/ACT Aerosol Soln, Inhalation as needed) Active. Medications Reconciled  Social History Mammie Lorenzo, LPN; 03/18/6008 9:32 AM) Caffeine use Carbonated beverages, Coffee, Tea. No alcohol use No drug use Tobacco use Never smoker.  Family History Mammie Lorenzo, LPN; 3/55/7322 0:25 AM) Alcohol Abuse Brother, Daughter, Father. Anesthetic complications Mother. Arthritis Mother. Bleeding disorder Mother. Breast Cancer Mother. Cancer Brother. Cerebrovascular Accident Mother. Colon Cancer Brother, Family Members In  General, Mother. Colon Polyps Brother, Mother. Depression Brother, Daughter, Mother. Diabetes Mellitus Family Members In General. Heart Disease Brother, Mother. Heart disease in female family member before age 49 Heart disease in female family member before age 86 Hypertension Brother, Mother. Migraine Headache Daughter, Mother. Respiratory Condition Brother, Father.  Pregnancy / Birth History Mammie Lorenzo, LPN; 05/14/621 7:62 AM) Age at menarche 54 years. Age of menopause 53-50 Contraceptive History Oral contraceptives. Gravida 4 Maternal age 36-20 Para 3 Regular periods  Other Problems Mammie Lorenzo, LPN; 09/18/5174 1:60 AM) Anxiety Disorder Arthritis Asthma Atrial Fibrillation Back Pain Bladder Problems Cerebrovascular Accident Gastric Ulcer Gastroesophageal Reflux Disease Hemorrhoids High blood pressure Hypercholesterolemia Lump In Breast Oophorectomy Bilateral. Other disease, cancer, significant illness Pancreatitis Transfusion history     Review of Systems Claiborne Billings Dockery LPN; 7/37/1062 6:94 AM) General Present- Fatigue and Weight Gain. Not Present- Appetite Loss, Chills, Fever, Night Sweats and Weight Loss. Skin Present- Dryness. Not Present- Change in Wart/Mole, Hives, Jaundice, New Lesions, Non-Healing Wounds, Rash and Ulcer. HEENT Present- Hearing Loss, Hoarseness, Ringing in the Ears, Wears glasses/contact lenses and Yellow Eyes. Not Present- Earache, Nose Bleed, Oral Ulcers, Seasonal Allergies, Sinus Pain, Sore Throat and Visual Disturbances. Respiratory Present- Difficulty Breathing. Not Present- Bloody sputum, Chronic Cough, Snoring and Wheezing. Breast Not Present- Breast Mass, Breast Pain, Nipple Discharge and Skin Changes. Cardiovascular Present- Palpitations, Shortness of Breath and Swelling of Extremities. Not Present- Chest Pain, Difficulty Breathing Lying Down, Leg Cramps and Rapid Heart Rate. Gastrointestinal  Present- Bloating, Bloody Stool, Change in Bowel Habits, Hemorrhoids and Rectal Pain. Not Present- Abdominal Pain, Chronic diarrhea, Constipation, Difficulty Swallowing, Excessive gas, Gets full quickly at meals, Indigestion, Nausea and Vomiting. Female Genitourinary Present- Frequency, Nocturia and Urgency. Not Present- Painful Urination and Pelvic Pain. Musculoskeletal Present- Back Pain, Joint Stiffness and Swelling of Extremities. Not Present- Joint Pain, Muscle Pain and Muscle  Weakness. Neurological Present- Decreased Memory. Not Present- Fainting, Headaches, Numbness, Seizures, Tingling, Tremor, Trouble walking and Weakness. Psychiatric Present- Anxiety. Not Present- Bipolar, Change in Sleep Pattern, Depression, Fearful and Frequent crying. Endocrine Present- Excessive Hunger. Not Present- Cold Intolerance, Hair Changes, Heat Intolerance, Hot flashes and New Diabetes. Hematology Not Present- Blood Thinners, Easy Bruising, Excessive bleeding, Gland problems, HIV and Persistent Infections.  Vitals Claiborne Billings Dockery LPN; 3/54/5625 6:38 AM) 10/15/2016 9:04 AM Weight: 167.2 lb Height: 61in Body Surface Area: 1.75 m Body Mass Index: 31.59 kg/m  Temp.: 98.41F(Temporal)  Pulse: 68 (Regular)  BP: 120/76 (Sitting, Left Arm, Standard)      Physical Exam Leighton Ruff MD; 9/37/3428 9:30 AM)  General Mental Status-Alert. General Appearance-Not in acute distress. Build & Nutrition-Well nourished. Posture-Normal posture. Gait-Normal.  Head and Neck Head-normocephalic, atraumatic with no lesions or palpable masses. Trachea-midline.  Chest and Lung Exam Chest and lung exam reveals -on auscultation, normal breath sounds, no adventitious sounds and normal vocal resonance.  Cardiovascular Cardiovascular examination reveals -normal heart sounds, regular rate and rhythm with no murmurs and no digital clubbing, cyanosis, edema, increased warmth or  tenderness.  Abdomen Inspection Inspection of the abdomen reveals - No Hernias. Palpation/Percussion Palpation and Percussion of the abdomen reveal - Soft, Non Tender, No Rigidity (guarding), No hepatosplenomegaly and No Palpable abdominal masses.  Rectal Anorectal Exam External - Note: prolapsing RA hem. Internal - normal internal exam and normal sphincter tone.  Neurologic Neurologic evaluation reveals -alert and oriented x 3 with no impairment of recent or remote memory, normal attention span and ability to concentrate, normal sensation and normal coordination.  Musculoskeletal Normal Exam - Bilateral-Upper Extremity Strength Normal and Lower Extremity Strength Normal.   Results Leighton Ruff MD; 7/68/1157 9:26 AM) Procedures  Name Value Date ANOSCOPY, DIAGNOSTIC (26203) [ Hemorrhoids ] Procedure Other: Procedure: Anoscopy Surgeon: Marcello Moores After the risks and benefits were explained, verbal consent was obtained for above procedure. A medical assistant chaperone was present thoroughout the entire procedure. Anesthesia: none Diagnosis: rectal bleeding Findings: Grade 3 right anterior, grade 2 right posterior, grade 1 LL  Performed: 10/15/2016 9:24 AM    Assessment & Plan Leighton Ruff MD; 5/59/7416 9:32 AM)  PROLAPSED INTERNAL HEMORRHOIDS, GRADE 3 (L84.5) Impression: 66 year old female who presents to the office with daily rectal bleeding and physical exam confirms findings of a grade 3 right anterior internal hemorrhoid and a grade 2 right posterior internal hemorrhoid. I recommended hemorrhoidectomy for the right posterior hemorrhoid. We discussed possibly doing a hemorrhoid pexy at the same time for the right anterior hemorrhoid. We discussed that this will add a little more pain to the operation but she may not need any further procedures in the future. We discussed that she can definitely have recurrence of her hemorrhoids if she continues the habits that caused them  in the first place. She reports that she no longer has trouble with constipation and feels that she can continue these bowel habits after surgery. We discussed the risk of bleeding and that she should avoid heavy lifting for at least 48 hours after surgery.

## 2016-10-22 ENCOUNTER — Encounter (HOSPITAL_BASED_OUTPATIENT_CLINIC_OR_DEPARTMENT_OTHER): Payer: Self-pay | Admitting: *Deleted

## 2016-10-25 ENCOUNTER — Encounter (HOSPITAL_BASED_OUTPATIENT_CLINIC_OR_DEPARTMENT_OTHER): Payer: Self-pay | Admitting: *Deleted

## 2016-10-25 NOTE — Progress Notes (Signed)
NPO AFTER MN W/ EXCEPTION CLEAR LIQUIDS UNTIL 0600 (NO CREAM/ MILK PRODUCTS).  ARRIVE AT 1015.  NEEDS HG.  CURRENT EKG IN CHART AND EPIC.

## 2016-10-28 ENCOUNTER — Encounter (HOSPITAL_BASED_OUTPATIENT_CLINIC_OR_DEPARTMENT_OTHER): Payer: Self-pay | Admitting: *Deleted

## 2016-10-28 ENCOUNTER — Ambulatory Visit (HOSPITAL_BASED_OUTPATIENT_CLINIC_OR_DEPARTMENT_OTHER): Payer: Medicare HMO | Admitting: Anesthesiology

## 2016-10-28 ENCOUNTER — Ambulatory Visit (HOSPITAL_BASED_OUTPATIENT_CLINIC_OR_DEPARTMENT_OTHER)
Admission: RE | Admit: 2016-10-28 | Discharge: 2016-10-28 | Disposition: A | Discharge status: Home / Self Care | Payer: Medicare HMO | Source: Ambulatory Visit | Attending: General Surgery | Admitting: General Surgery

## 2016-10-28 ENCOUNTER — Encounter (HOSPITAL_BASED_OUTPATIENT_CLINIC_OR_DEPARTMENT_OTHER)
Admission: RE | Disposition: A | Discharge status: Home / Self Care | Payer: Self-pay | Source: Ambulatory Visit | Attending: General Surgery

## 2016-10-28 DIAGNOSIS — M1990 Unspecified osteoarthritis, unspecified site: Secondary | ICD-10-CM | POA: Insufficient documentation

## 2016-10-28 DIAGNOSIS — K641 Second degree hemorrhoids: Secondary | ICD-10-CM | POA: Diagnosis not present

## 2016-10-28 DIAGNOSIS — K219 Gastro-esophageal reflux disease without esophagitis: Secondary | ICD-10-CM | POA: Diagnosis not present

## 2016-10-28 DIAGNOSIS — I10 Essential (primary) hypertension: Secondary | ICD-10-CM | POA: Insufficient documentation

## 2016-10-28 DIAGNOSIS — Z818 Family history of other mental and behavioral disorders: Secondary | ICD-10-CM | POA: Insufficient documentation

## 2016-10-28 DIAGNOSIS — Z8673 Personal history of transient ischemic attack (TIA), and cerebral infarction without residual deficits: Secondary | ICD-10-CM | POA: Insufficient documentation

## 2016-10-28 DIAGNOSIS — E7800 Pure hypercholesterolemia, unspecified: Secondary | ICD-10-CM | POA: Insufficient documentation

## 2016-10-28 DIAGNOSIS — I471 Supraventricular tachycardia: Secondary | ICD-10-CM | POA: Diagnosis not present

## 2016-10-28 DIAGNOSIS — Z811 Family history of alcohol abuse and dependence: Secondary | ICD-10-CM | POA: Diagnosis not present

## 2016-10-28 DIAGNOSIS — K859 Acute pancreatitis without necrosis or infection, unspecified: Secondary | ICD-10-CM | POA: Diagnosis not present

## 2016-10-28 DIAGNOSIS — F419 Anxiety disorder, unspecified: Secondary | ICD-10-CM | POA: Diagnosis not present

## 2016-10-28 DIAGNOSIS — Z90710 Acquired absence of both cervix and uterus: Secondary | ICD-10-CM | POA: Insufficient documentation

## 2016-10-28 DIAGNOSIS — K625 Hemorrhage of anus and rectum: Secondary | ICD-10-CM | POA: Insufficient documentation

## 2016-10-28 DIAGNOSIS — Z8249 Family history of ischemic heart disease and other diseases of the circulatory system: Secondary | ICD-10-CM | POA: Diagnosis not present

## 2016-10-28 DIAGNOSIS — J45909 Unspecified asthma, uncomplicated: Secondary | ICD-10-CM | POA: Diagnosis not present

## 2016-10-28 DIAGNOSIS — Z820 Family history of epilepsy and other diseases of the nervous system: Secondary | ICD-10-CM | POA: Insufficient documentation

## 2016-10-28 DIAGNOSIS — M549 Dorsalgia, unspecified: Secondary | ICD-10-CM | POA: Diagnosis not present

## 2016-10-28 DIAGNOSIS — Z803 Family history of malignant neoplasm of breast: Secondary | ICD-10-CM | POA: Diagnosis not present

## 2016-10-28 DIAGNOSIS — I4891 Unspecified atrial fibrillation: Secondary | ICD-10-CM | POA: Diagnosis not present

## 2016-10-28 DIAGNOSIS — Z79899 Other long term (current) drug therapy: Secondary | ICD-10-CM | POA: Diagnosis not present

## 2016-10-28 DIAGNOSIS — Z800 Family history of malignant neoplasm of digestive organs: Secondary | ICD-10-CM | POA: Diagnosis not present

## 2016-10-28 DIAGNOSIS — Z8371 Family history of colonic polyps: Secondary | ICD-10-CM | POA: Diagnosis not present

## 2016-10-28 DIAGNOSIS — Z8711 Personal history of peptic ulcer disease: Secondary | ICD-10-CM | POA: Diagnosis not present

## 2016-10-28 DIAGNOSIS — Z8261 Family history of arthritis: Secondary | ICD-10-CM | POA: Diagnosis not present

## 2016-10-28 DIAGNOSIS — Z836 Family history of other diseases of the respiratory system: Secondary | ICD-10-CM | POA: Diagnosis not present

## 2016-10-28 DIAGNOSIS — K642 Third degree hemorrhoids: Secondary | ICD-10-CM | POA: Insufficient documentation

## 2016-10-28 DIAGNOSIS — K649 Unspecified hemorrhoids: Secondary | ICD-10-CM | POA: Diagnosis not present

## 2016-10-28 HISTORY — DX: Pseudocyst of pancreas: K86.3

## 2016-10-28 HISTORY — DX: Hemorrhage of anus and rectum: K62.5

## 2016-10-28 HISTORY — DX: Personal history of colonic polyps: Z86.010

## 2016-10-28 HISTORY — PX: HEMORRHOID SURGERY: SHX153

## 2016-10-28 HISTORY — DX: Cyst of kidney, acquired: N28.1

## 2016-10-28 HISTORY — DX: Urgency of urination: R39.15

## 2016-10-28 HISTORY — DX: Frequency of micturition: R35.0

## 2016-10-28 HISTORY — DX: Personal history of traumatic brain injury: Z87.820

## 2016-10-28 HISTORY — DX: Atrioventricular block, first degree: I44.0

## 2016-10-28 HISTORY — DX: Other specified postprocedural states: Z98.890

## 2016-10-28 HISTORY — DX: Personal history of vaginal dysplasia: Z87.411

## 2016-10-28 HISTORY — DX: Personal history of adenomatous and serrated colon polyps: Z86.0101

## 2016-10-28 HISTORY — DX: Personal history of other diseases of the digestive system: Z87.19

## 2016-10-28 HISTORY — DX: Personal history of transient ischemic attack (TIA), and cerebral infarction without residual deficits: Z86.73

## 2016-10-28 HISTORY — DX: Unspecified hemorrhoids: K64.9

## 2016-10-28 LAB — POCT HEMOGLOBIN-HEMACUE: HEMOGLOBIN: 14.7 g/dL (ref 12.0–15.0)

## 2016-10-28 SURGERY — HEMORRHOIDECTOMY
Anesthesia: Monitor Anesthesia Care | Site: Rectum

## 2016-10-28 MED ORDER — FENTANYL CITRATE (PF) 100 MCG/2ML IJ SOLN
25.0000 ug | INTRAMUSCULAR | Status: DC | PRN
  Filled 2016-10-28: qty 1

## 2016-10-28 MED ORDER — SODIUM CHLORIDE 0.9% FLUSH
3.0000 mL | INTRAVENOUS | Status: DC | PRN
  Filled 2016-10-28: qty 3

## 2016-10-28 MED ORDER — PROPOFOL 10 MG/ML IV BOLUS
INTRAVENOUS | Status: DC | PRN
  Administered 2016-10-28: 40 mg via INTRAVENOUS

## 2016-10-28 MED ORDER — OXYCODONE HCL 5 MG PO TABS: 5.0000 mg | ORAL_TABLET | Freq: Four times a day (QID) | ORAL | 0 refills | Status: DC | PRN

## 2016-10-28 MED ORDER — PROPOFOL 500 MG/50ML IV EMUL
INTRAVENOUS | Status: DC | PRN
  Administered 2016-10-28: 50 ug/kg/min via INTRAVENOUS

## 2016-10-28 MED ORDER — MIDAZOLAM HCL 5 MG/5ML IJ SOLN
INTRAMUSCULAR | Status: DC | PRN
  Administered 2016-10-28: 2 mg via INTRAVENOUS

## 2016-10-28 MED ORDER — 0.9 % SODIUM CHLORIDE (POUR BTL) OPTIME
TOPICAL | Status: DC | PRN
  Administered 2016-10-28: 500 mL

## 2016-10-28 MED ORDER — ACETAMINOPHEN 650 MG RE SUPP
650.0000 mg | RECTAL | Status: DC | PRN
  Filled 2016-10-28: qty 1

## 2016-10-28 MED ORDER — OXYCODONE HCL 5 MG/5ML PO SOLN
5.0000 mg | Freq: Once | ORAL | Status: DC | PRN
  Filled 2016-10-28: qty 5

## 2016-10-28 MED ORDER — ACETAMINOPHEN 325 MG PO TABS
650.0000 mg | ORAL_TABLET | ORAL | Status: DC | PRN
  Filled 2016-10-28: qty 2

## 2016-10-28 MED ORDER — SODIUM CHLORIDE 0.9 % IV SOLN
250.0000 mL | INTRAVENOUS | Status: DC | PRN
  Filled 2016-10-28: qty 250

## 2016-10-28 MED ORDER — BUPIVACAINE LIPOSOME 1.3 % IJ SUSP
INTRAMUSCULAR | Status: DC | PRN
  Administered 2016-10-28: 20 mL

## 2016-10-28 MED ORDER — SODIUM CHLORIDE 0.9% FLUSH
3.0000 mL | Freq: Two times a day (BID) | INTRAVENOUS | Status: DC
  Filled 2016-10-28: qty 3

## 2016-10-28 MED ORDER — LIDOCAINE 5 % EX OINT
TOPICAL_OINTMENT | CUTANEOUS | Status: DC | PRN
  Administered 2016-10-28: 1

## 2016-10-28 MED ORDER — BUPIVACAINE-EPINEPHRINE 0.5% -1:200000 IJ SOLN
INTRAMUSCULAR | Status: DC | PRN
  Administered 2016-10-28: 30 mL

## 2016-10-28 MED ORDER — PROPOFOL 500 MG/50ML IV EMUL
INTRAVENOUS | Status: AC
  Filled 2016-10-28: qty 50

## 2016-10-28 MED ORDER — LACTATED RINGERS IV SOLN
INTRAVENOUS | Status: DC
  Administered 2016-10-28: 09:00:00 via INTRAVENOUS
  Filled 2016-10-28: qty 1000

## 2016-10-28 MED ORDER — OXYCODONE HCL 5 MG PO TABS
5.0000 mg | ORAL_TABLET | ORAL | Status: DC | PRN
  Filled 2016-10-28: qty 2

## 2016-10-28 MED ORDER — MIDAZOLAM HCL 2 MG/2ML IJ SOLN
INTRAMUSCULAR | Status: AC
  Filled 2016-10-28: qty 2

## 2016-10-28 MED ORDER — LIDOCAINE 2% (20 MG/ML) 5 ML SYRINGE
INTRAMUSCULAR | Status: DC | PRN
  Administered 2016-10-28: 50 mg via INTRAVENOUS

## 2016-10-28 MED ORDER — LIDOCAINE 2% (20 MG/ML) 5 ML SYRINGE
INTRAMUSCULAR | Status: AC
  Filled 2016-10-28: qty 5

## 2016-10-28 MED ORDER — OXYCODONE HCL 5 MG PO TABS
5.0000 mg | ORAL_TABLET | Freq: Once | ORAL | Status: DC | PRN
  Filled 2016-10-28: qty 1

## 2016-10-28 MED ORDER — ONDANSETRON HCL 4 MG/2ML IJ SOLN
4.0000 mg | Freq: Once | INTRAMUSCULAR | Status: DC | PRN
  Filled 2016-10-28: qty 2

## 2016-10-28 MED ORDER — FENTANYL CITRATE (PF) 100 MCG/2ML IJ SOLN
INTRAMUSCULAR | Status: DC | PRN
  Administered 2016-10-28: 25 ug via INTRAVENOUS

## 2016-10-28 MED ORDER — FENTANYL CITRATE (PF) 100 MCG/2ML IJ SOLN
INTRAMUSCULAR | Status: AC
  Filled 2016-10-28: qty 2

## 2016-10-28 SURGICAL SUPPLY — 35 items
BLADE HEX COATED 2.75 (ELECTRODE) ×3 IMPLANT
BLADE SURG 10 STRL SS (BLADE) ×3 IMPLANT
BRIEF STRETCH FOR OB PAD LRG (UNDERPADS AND DIAPERS) ×6 IMPLANT
COVER BACK TABLE 60X90IN (DRAPES) ×3 IMPLANT
COVER MAYO STAND STRL (DRAPES) ×3 IMPLANT
DRAPE LAPAROTOMY 100X72 PEDS (DRAPES) ×3 IMPLANT
DRAPE UTILITY XL STRL (DRAPES) ×3 IMPLANT
ELECT BLADE 6.5 .24CM SHAFT (ELECTRODE) IMPLANT
ELECT REM PT RETURN 9FT ADLT (ELECTROSURGICAL) ×3
ELECTRODE REM PT RTRN 9FT ADLT (ELECTROSURGICAL) ×1 IMPLANT
GAUZE SPONGE 4X4 12PLY STRL LF (GAUZE/BANDAGES/DRESSINGS) ×3 IMPLANT
GLOVE BIO SURGEON STRL SZ 6.5 (GLOVE) ×4 IMPLANT
GLOVE BIO SURGEONS STRL SZ 6.5 (GLOVE) ×2
GLOVE BIOGEL PI IND STRL 7.5 (GLOVE) ×1 IMPLANT
GLOVE BIOGEL PI INDICATOR 7.5 (GLOVE) ×2
GLOVE INDICATOR 7.0 STRL GRN (GLOVE) ×6 IMPLANT
GOWN SPEC L3 XXLG W/TWL (GOWN DISPOSABLE) ×6 IMPLANT
KIT RM TURNOVER CYSTO AR (KITS) ×3 IMPLANT
NEEDLE HYPO 22GX1.5 SAFETY (NEEDLE) ×3 IMPLANT
NS IRRIG 500ML POUR BTL (IV SOLUTION) ×3 IMPLANT
PACK BASIN DAY SURGERY FS (CUSTOM PROCEDURE TRAY) ×3 IMPLANT
PAD ABD 8X10 STRL (GAUZE/BANDAGES/DRESSINGS) ×3 IMPLANT
PAD ARMBOARD 7.5X6 YLW CONV (MISCELLANEOUS) ×3 IMPLANT
PENCIL BUTTON HOLSTER BLD 10FT (ELECTRODE) ×3 IMPLANT
SPONGE GAUZE 4X4 12PLY (GAUZE/BANDAGES/DRESSINGS) ×3 IMPLANT
SUT CHROMIC 2 0 SH (SUTURE) ×6 IMPLANT
SUT CHROMIC 3 0 SH 27 (SUTURE) ×6 IMPLANT
SUT VIC AB 3-0 SH 27 (SUTURE) ×2
SUT VIC AB 3-0 SH 27X BRD (SUTURE) ×1 IMPLANT
SYR CONTROL 10ML LL (SYRINGE) ×3 IMPLANT
TRAY DSU PREP LF (CUSTOM PROCEDURE TRAY) ×3 IMPLANT
TUBE CONNECTING 12'X1/4 (SUCTIONS) ×1
TUBE CONNECTING 12X1/4 (SUCTIONS) ×2 IMPLANT
WATER STERILE IRR 500ML POUR (IV SOLUTION) IMPLANT
YANKAUER SUCT BULB TIP NO VENT (SUCTIONS) ×3 IMPLANT

## 2016-10-28 NOTE — Discharge Instructions (Addendum)
ANORECTAL SURGERY: POST OP INSTRUCTIONS °1. Take your usually prescribed home medications unless otherwise directed. °2. DIET: During the first few hours after surgery sip on some liquids until you are able to urinate.  It is normal to not urinate for several hours after this surgery.  If you feel uncomfortable, please contact the office for instructions.  After you are able to urinate,you may eat, if you feel like it.  Follow a light bland diet the first 24 hours after arrival home, such as soup, liquids, crackers, etc.  Be sure to include lots of fluids daily (6-8 glasses).  Avoid fast food or heavy meals, as your are more likely to get nauseated.  Eat a low fat diet the next few days after surgery.  Limit caffeine intake to 1-2 servings a day. °3. PAIN CONTROL: °a. Pain is best controlled by a usual combination of several different methods TOGETHER: °i. Muscle relaxation: Soak in a warm bath (or Sitz bath) three times a day and after bowel movements.  Continue to do this until all pain is resolved. °ii. Over the counter pain medication °iii. Prescription pain medication °b. Most patients will experience some swelling and discomfort in the anus/rectal area and incisions.  Heat such as warm towels, sitz baths, warm baths, etc to help relax tight/sore spots and speed recovery.  Some people prefer to use ice, especially in the first couple days after surgery, as it may decrease the pain and swelling, or alternate between ice & heat.  Experiment to what works for you.  Swelling and bruising can take several weeks to resolve.  Pain can take even longer to completely resolve. °c. It is helpful to take an over-the-counter pain medication regularly for the first few weeks.  Choose one of the following that works best for you: °i. Naproxen (Aleve, etc)  Two 220mg tabs twice a day °ii. Ibuprofen (Advil, etc) Three 200mg tabs four times a day (every meal & bedtime) °d. A  prescription for pain medication (such as percocet,  oxycodone, hydrocodone, etc) should be given to you upon discharge.  Take your pain medication as prescribed.  °i. If you are having problems/concerns with the prescription medicine (does not control pain, nausea, vomiting, rash, itching, etc), please call us [phone] to see if we need to switch you to a different pain medicine that will work better for you and/or control your side effect better. °ii. If you need a refill on your pain medication, please contact your pharmacy.  They will contact our office to request authorization. Prescriptions will not be filled after 5 pm or on week-ends. °4. KEEP YOUR BOWELS REGULAR and AVOID CONSTIPATION °a. The goal is one to two soft bowel movements a day.  You should at least have a bowel movement every other day. °b. Avoid getting constipated.  Between the surgery and the pain medications, it is common to experience some constipation. This can be very painful after rectal surgery.  Increasing fluid intake and taking a fiber supplement (such as Metamucil, Citrucel, FiberCon, etc) 1-2 times a day regularly will usually help prevent this problem from occurring.  A stool softener like colace is also recommended.  This can be purchased over the counter at your pharmacy.  You can take it up to 3 times a day.  If you do not have a bowel movement after 24 hrs since your surgery, take one does of milk of magnesia.  If you still haven't had a bowel movement 8-12 hours after   that dose, take another dose.  If you don't have a bowel movement 48 hrs after surgery, purchase a Fleets enema from the drug store and administer gently per package instructions.  If you still are having trouble with your bowel movements after that, please call the office for further instructions. °c. If you develop diarrhea or have many loose bowel movements, simplify your diet to bland foods & liquids for a few days.  Stop any stool softeners and decrease your fiber supplement.  Switching to mild  anti-diarrheal medications (Kayopectate, Pepto Bismol) can help.  If this worsens or does not improve, please call us. ° °5. Wound Care °a. Remove your bandages before your first bowel movement or 8 hours after surgery.     °b. Remove any wound packing material at this tim,e as well.  You do not need to repack the wound unless instructed otherwise.  Wear an absorbent pad or soft cotton gauze in your underwear to catch any drainage and help keep the area clean. You should change this every 2-3 hours while awake. °c. Keep the area clean and dry.  Bathe / shower every day, especially after bowel movements.  Keep the area clean by showering / bathing over the incision / wound.   It is okay to soak an open wound to help wash it.  Wet wipes or showers / gentle washing after bowel movements is often less traumatic than regular toilet paper. °d. You may have some styrofoam-like soft packing in the rectum which will come out with the first bowel movement.  °e. You will often notice bleeding with bowel movements.  This should slow down by the end of the first week of surgery °f. Expect some drainage.  This should slow down, too, by the end of the first week of surgery.  Wear an absorbent pad or soft cotton gauze in your underwear until the drainage stops. °g. Do Not sit on a rubber or pillow ring.  This can make you symptoms worse.  You may sit on a soft pillow if needed.  °6. ACTIVITIES as tolerated:   °a. You may resume regular (light) daily activities beginning the next day--such as daily self-care, walking, climbing stairs--gradually increasing activities as tolerated.  If you can walk 30 minutes without difficulty, it is safe to try more intense activity such as jogging, treadmill, bicycling, low-impact aerobics, swimming, etc. °b. Save the most intensive and strenuous activity for last such as sit-ups, heavy lifting, contact sports, etc  Refrain from any heavy lifting or straining until you are off narcotics for pain  control.   °c. You may drive when you are no longer taking prescription pain medication, you can comfortably sit for long periods of time, and you can safely maneuver your car and apply brakes. °d. You may have sexual intercourse when it is comfortable.  °7. FOLLOW UP in our office °a. Please call [facility] at [phone] to set up an appointment to see your surgeon in the office for a follow-up appointment approximately 3-4 weeks after your surgery. °b. Make sure that you call for this appointment the day you arrive home to insure a convenient appointment time. °10. IF YOU HAVE DISABILITY OR FAMILY LEAVE FORMS, BRING THEM TO THE OFFICE FOR PROCESSING.  DO NOT GIVE THEM TO YOUR DOCTOR. ° ° ° ° °WHEN TO CALL US [phone]: °1. Poor pain control °2. Reactions / problems with new medications (rash/itching, nausea, etc)  °3. Fever over 101.5 F (38.5 C) °4.   Inability to urinate °5. Nausea and/or vomiting °6. Worsening swelling or bruising °7. Continued bleeding from incision. °8. Increased pain, redness, or drainage from the incision ° °The clinic staff is available to answer your questions during regular business hours (8:30am-5pm).  Please don’t hesitate to call and ask to speak to one of our nurses for clinical concerns.   A surgeon from [facility] is always on call at the hospitals °  °If you have a medical emergency, go to the nearest emergency room or call 911. °  ° °[facility] °[address], [address], [place], [place]  [address] ? °MAIN: [phone] ? TOLL FREE: [phone] ? °FAX [phone] °[email or web address] ° °Information for Discharge Teaching: °EXPAREL (bupivacaine liposome injectable suspension)  ° °Your surgeon gave you EXPAREL(bupivacaine) in your surgical incision to help control your pain after surgery.  °· EXPAREL is a local anesthetic that provides pain relief by numbing the tissue around the surgical site. °· EXPAREL is designed to release  pain medication over time and can control pain for up to 72 hours. °· Depending on how you respond to EXPAREL, you may require less pain medication during your recovery. ° °Possible side effects: °· Temporary loss of sensation or ability to move in the area where bupivacaine was injected. °· Nausea, vomiting, constipation °· Rarely, numbness and tingling in your mouth or lips, lightheadedness, or anxiety may occur. °· Call your doctor right away if you think you may be experiencing any of these sensations, or if you have other questions regarding possible side effects. ° °Follow all other discharge instructions given to you by your surgeon or nurse. Eat a healthy diet and drink plenty of water or other fluids. ° °If you return to the hospital for any reason within 96 hours following the administration of EXPAREL, please inform your health care providers. ° ° °Post Anesthesia Home Care Instructions ° °Activity: °Get plenty of rest for the remainder of the day. A responsible individual must stay with you for 24 hours following the procedure.  °For the next 24 hours, DO NOT: °-Drive a car °-Operate machinery °-Drink alcoholic beverages °-Take any medication unless instructed by your physician °-Make any legal decisions or sign important papers. ° °Meals: °Start with liquid foods such as gelatin or soup. Progress to regular foods as tolerated. Avoid greasy, spicy, heavy foods. If nausea and/or vomiting occur, drink only clear liquids until the nausea and/or vomiting subsides. Call your physician if vomiting continues. ° °Special Instructions/Symptoms: °Your throat may feel dry or sore from the anesthesia or the breathing tube placed in your throat during surgery. If this causes discomfort, gargle with warm salt water. The discomfort should disappear within 24 hours. ° °If you had a scopolamine patch placed behind your ear for the management of post- operative nausea and/or vomiting: ° °1. The medication in the patch is  effective for 72 hours, after which it should be removed.  Wrap patch in a tissue and discard in the trash. Wash hands thoroughly with soap and water. °2. You may remove the patch earlier than 72 hours if you experience unpleasant side effects which may include dry mouth, dizziness or visual disturbances. °3. Avoid touching the patch. Wash your hands with soap and water after contact with the patch. °  ° ° ° ° °

## 2016-10-28 NOTE — Anesthesia Preprocedure Evaluation (Signed)
Anesthesia Evaluation  Patient identified by MRN, date of birth, ID band Patient awake    Reviewed: Allergy & Precautions, NPO status , Patient's Chart, lab work & pertinent test results  Airway Mallampati: II  TM Distance: >3 FB Neck ROM: Full    Dental  (+) Teeth Intact, Dental Advisory Given   Pulmonary    breath sounds clear to auscultation       Cardiovascular hypertension,  Rhythm:Regular Rate:Normal     Neuro/Psych    GI/Hepatic   Endo/Other    Renal/GU      Musculoskeletal   Abdominal   Peds  Hematology   Anesthesia Other Findings   Reproductive/Obstetrics                             Anesthesia Physical Anesthesia Plan  ASA: III  Anesthesia Plan: MAC   Post-op Pain Management:    Induction: Intravenous  PONV Risk Score and Plan: Ondansetron and Dexamethasone  Airway Management Planned: Natural Airway and Nasal Cannula  Additional Equipment:   Intra-op Plan:   Post-operative Plan:   Informed Consent: I have reviewed the patients History and Physical, chart, labs and discussed the procedure including the risks, benefits and alternatives for the proposed anesthesia with the patient or authorized representative who has indicated his/her understanding and acceptance.     Dental advisory given  Plan Discussed with: CRNA and Anesthesiologist  Anesthesia Plan Comments:         Anesthesia Quick Evaluation  

## 2016-10-28 NOTE — Op Note (Addendum)
10/28/2016  10:52 AM  PATIENT:  Pamela Daniel  66 y.o. female  Patient Care Team: Velna Hatchet, MD as PCP - General (Internal Medicine) Terrance Mass, MD as Consulting Physician (Gynecology) Adrian Prows, MD as Consulting Physician (Cardiology)  PRE-OPERATIVE DIAGNOSIS:  rectal bleeding, Grade 3 internal hemorrhoid  POST-OPERATIVE DIAGNOSIS:    rectal bleeding, Grade 3 internal hemorrhoid  PROCEDURE:   HEMORRHOIDECTOMY AND HEMORRHOIDAL PEXY   Surgeon(s): Leighton Ruff, MD  ASSISTANT: none   ANESTHESIA:   local and MAC  SPECIMEN:  Source of Specimen:  R anterior internal hemorrhoid  DISPOSITION OF SPECIMEN:  PATHOLOGY  COUNTS:  YES  PLAN OF CARE: Discharge to home after PACU  PATIENT DISPOSITION:  PACU - hemodynamically stable.  INDICATION: 66 y.o. F with rectal bleeding and normal colonoscopy here for excision of a grade 3 internal hemorrhoid   OR FINDINGS: Grade 3 R anterior hemorrhoid, Grade 2 R posterior hemorrhoid   DESCRIPTION: the patient was identified in the preoperative holding area and taken to the OR where they were laid on the operating room table.  MAC anesthesia was induced without difficulty. The patient was then positioned in prone jackknife position with buttocks gently taped apart.  The patient was then prepped and draped in usual sterile fashion.  SCDs were noted to be in place prior to the initiation of anesthesia. A surgical timeout was performed indicating the correct patient, procedure, positioning and need for preoperative antibiotics.  A rectal block was performed using Marcaine with epinephrine mixed with Experel.    I began with a digital rectal exam.  Hard stool was noted in the rectal vault. The exam was otherwise normal.  I then placed a Hill-Ferguson anoscope into the anal canal and evaluated this completely.  The stone was removed. She was noted to have a grade 3 right anterior internal hemorrhoid and a grade 2 right posterior  internal hemorrhoid. There was minimal external component. I resected the right anterior hemorrhoid by elevating with an Allis clamp. I used Metzenbaum scissors to divide the skin and dissect down to the level of the stricture complex. I then dissected the hemorrhoidal tissue off of the sphincter complex bluntly and then removed this using Metzenbaum scissors. I then used a 2-0 chromic suture to close the anal mucosa in a running fashion to the dentate line. I used a 3-0 chromic suture to close the anoderm distal to the dentate line. Hemostasis was good. The patient had a grade 2 hemorrhoid with minimal signs of inflammation in the right posterior region. I used a 3-0 Vicryl suture to perform a hemorrhoidal pexy for this hemorrhoid.  Additional local anesthesia was placed over the surgical sites. Lidocaine ointment and sterile dressing were applied. The patient was awakened from anesthesia and sent to the postanesthesia care unit stable condition. All counts were correct per operating room staff.    I have reviewed the [place] and see no other narcotics listed for this patient.

## 2016-10-28 NOTE — Transfer of Care (Signed)
Immediate Anesthesia Transfer of Care Note  Patient: Pamela Daniel  Procedure(s) Performed: HEMORRHOIDECTOMY AND HEMORRHOIDAL PEXY (N/A )  Patient Location: PACU  Anesthesia Type:MAC  Level of Consciousness: awake, alert  and oriented  Airway & Oxygen Therapy: Patient Spontanous Breathing and Patient connected to nasal cannula oxygen  Post-op Assessment: Report given to RN  Post vital signs: Reviewed and stable  Last Vitals:  Vitals:   10/28/16 0815 10/28/16 1100  BP: (!) 151/83 (P) 135/74  Pulse: 73 (P) 76  Resp: 16 (!) (P) 22  Temp: 36.8 C (P) 36.8 C  SpO2: 96% (P) 99%    Last Pain:  Vitals:   10/28/16 0815  TempSrc: Oral      Patients Stated Pain Goal: 8 (37/16/96 7893)  Complications: No apparent anesthesia complications

## 2016-10-28 NOTE — Interval H&P Note (Signed)
History and Physical Interval Note:  10/28/2016 8:54 AM  Pamela Daniel  has presented today for surgery, with the diagnosis of rectal bleeding, HEMS  The various methods of treatment have been discussed with the patient and family. After consideration of risks, benefits and other options for treatment, the patient has consented to  Procedure(s): HEMORRHOIDECTOMY AND HEMORRHOIDAL PEXY (N/A) as a surgical intervention .  We discussed possibly doing a second hemorrhoidectomy if needed.  Pt's wishes are to take care of everything that is needed no matter how much it would increase her pain after surgery.  The patient's history has been reviewed, patient examined, no change in status, stable for surgery.  I have reviewed the patient's chart and labs.  Questions were answered to the patient's satisfaction.     Rosario Adie, MD  [facility]

## 2016-10-28 NOTE — Anesthesia Postprocedure Evaluation (Signed)
Anesthesia Post Note  Patient: Pamela Daniel  Procedure(s) Performed: HEMORRHOIDECTOMY AND HEMORRHOIDAL PEXY (N/A Rectum)     Patient location during evaluation: PACU Anesthesia Type: MAC Level of consciousness: awake, awake and alert and oriented Pain management: pain level controlled Vital Signs Assessment: post-procedure vital signs reviewed and stable Respiratory status: spontaneous breathing, nonlabored ventilation and respiratory function stable Cardiovascular status: blood pressure returned to baseline Anesthetic complications: no    Last Vitals:  Vitals:   10/28/16 1215 10/28/16 1300  BP: 128/69 139/89  Pulse: 68 68  Resp: 18 16  Temp:    SpO2: 95% 95%    Last Pain:  Vitals:   10/28/16 0815  TempSrc: Oral                 Moesha Sarchet COKER

## 2016-10-28 NOTE — H&P (View-Only) (Signed)
History of Present Illness Leighton Ruff MD; 7/78/2423 9:29 AM) The patient is a 66 year old female who presents with a complaint of Rectal bleeding. 67 year old female who presents to the office for evaluation of rectal bleeding. Patient has a family history of colon cancer and is up-to-date on her colonoscopies. She states that she has bleeding with every bowel movement and a prolapsing hemorrhoid that she has to reduce after bowel movements. On certain days her bleeding can be quite severe. She reports regular bowel habits and denies any problems with straining or constipation unless her hemorrhoids are swollen and irritated. She is not on any anticoagulation. She has tried Anusol suppositories and over-the-counter hemorrhoid medications with no change in her symptoms.   Past Surgical History Mammie Lorenzo, LPN; 5/36/1443 1:54 AM) Anal Fissure Repair Colon Polyp Removal - Colonoscopy Foot Surgery Left. Hysterectomy (not due to cancer) - Complete Nissen Fundoplication  Diagnostic Studies History Mammie Lorenzo, LPN; 0/08/6759 9:50 AM) Colonoscopy 1-5 years ago Mammogram >3 years ago Pap Smear 1-5 years ago  Allergies Mammie Lorenzo, LPN; 9/32/6712 4:58 AM) No Known Drug Allergies 10/15/2016 Allergies Reconciled  Medication History Mammie Lorenzo, LPN; 0/99/8338 2:50 AM) ALPRAZolam (0.25MG  Tablet, Oral) Active. Albuterol (90MCG/ACT Aerosol Soln, Inhalation as needed) Active. Medications Reconciled  Social History Mammie Lorenzo, LPN; 5/39/7673 4:19 AM) Caffeine use Carbonated beverages, Coffee, Tea. No alcohol use No drug use Tobacco use Never smoker.  Family History Mammie Lorenzo, LPN; 3/79/0240 9:73 AM) Alcohol Abuse Brother, Daughter, Father. Anesthetic complications Mother. Arthritis Mother. Bleeding disorder Mother. Breast Cancer Mother. Cancer Brother. Cerebrovascular Accident Mother. Colon Cancer Brother, Family Members In  General, Mother. Colon Polyps Brother, Mother. Depression Brother, Daughter, Mother. Diabetes Mellitus Family Members In General. Heart Disease Brother, Mother. Heart disease in female family member before age 53 Heart disease in female family member before age 1 Hypertension Brother, Mother. Migraine Headache Daughter, Mother. Respiratory Condition Brother, Father.  Pregnancy / Birth History Mammie Lorenzo, LPN; 5/32/9924 2:68 AM) Age at menarche 24 years. Age of menopause 79-50 Contraceptive History Oral contraceptives. Gravida 4 Maternal age 66-20 Para 3 Regular periods  Other Problems Mammie Lorenzo, LPN; 3/41/9622 2:97 AM) Anxiety Disorder Arthritis Asthma Atrial Fibrillation Back Pain Bladder Problems Cerebrovascular Accident Gastric Ulcer Gastroesophageal Reflux Disease Hemorrhoids High blood pressure Hypercholesterolemia Lump In Breast Oophorectomy Bilateral. Other disease, cancer, significant illness Pancreatitis Transfusion history     Review of Systems Claiborne Billings Dockery LPN; 9/89/2119 4:17 AM) General Present- Fatigue and Weight Gain. Not Present- Appetite Loss, Chills, Fever, Night Sweats and Weight Loss. Skin Present- Dryness. Not Present- Change in Wart/Mole, Hives, Jaundice, New Lesions, Non-Healing Wounds, Rash and Ulcer. HEENT Present- Hearing Loss, Hoarseness, Ringing in the Ears, Wears glasses/contact lenses and Yellow Eyes. Not Present- Earache, Nose Bleed, Oral Ulcers, Seasonal Allergies, Sinus Pain, Sore Throat and Visual Disturbances. Respiratory Present- Difficulty Breathing. Not Present- Bloody sputum, Chronic Cough, Snoring and Wheezing. Breast Not Present- Breast Mass, Breast Pain, Nipple Discharge and Skin Changes. Cardiovascular Present- Palpitations, Shortness of Breath and Swelling of Extremities. Not Present- Chest Pain, Difficulty Breathing Lying Down, Leg Cramps and Rapid Heart Rate. Gastrointestinal  Present- Bloating, Bloody Stool, Change in Bowel Habits, Hemorrhoids and Rectal Pain. Not Present- Abdominal Pain, Chronic diarrhea, Constipation, Difficulty Swallowing, Excessive gas, Gets full quickly at meals, Indigestion, Nausea and Vomiting. Female Genitourinary Present- Frequency, Nocturia and Urgency. Not Present- Painful Urination and Pelvic Pain. Musculoskeletal Present- Back Pain, Joint Stiffness and Swelling of Extremities. Not Present- Joint Pain, Muscle Pain and Muscle  Weakness. Neurological Present- Decreased Memory. Not Present- Fainting, Headaches, Numbness, Seizures, Tingling, Tremor, Trouble walking and Weakness. Psychiatric Present- Anxiety. Not Present- Bipolar, Change in Sleep Pattern, Depression, Fearful and Frequent crying. Endocrine Present- Excessive Hunger. Not Present- Cold Intolerance, Hair Changes, Heat Intolerance, Hot flashes and New Diabetes. Hematology Not Present- Blood Thinners, Easy Bruising, Excessive bleeding, Gland problems, HIV and Persistent Infections.  Vitals Claiborne Billings Dockery LPN; 04/02/1759 6:07 AM) 10/15/2016 9:04 AM Weight: 167.2 lb Height: 61in Body Surface Area: 1.75 m Body Mass Index: 31.59 kg/m  Temp.: 98.16F(Temporal)  Pulse: 68 (Regular)  BP: 120/76 (Sitting, Left Arm, Standard)      Physical Exam Leighton Ruff MD; 3/71/0626 9:30 AM)  General Mental Status-Alert. General Appearance-Not in acute distress. Build & Nutrition-Well nourished. Posture-Normal posture. Gait-Normal.  Head and Neck Head-normocephalic, atraumatic with no lesions or palpable masses. Trachea-midline.  Chest and Lung Exam Chest and lung exam reveals -on auscultation, normal breath sounds, no adventitious sounds and normal vocal resonance.  Cardiovascular Cardiovascular examination reveals -normal heart sounds, regular rate and rhythm with no murmurs and no digital clubbing, cyanosis, edema, increased warmth or  tenderness.  Abdomen Inspection Inspection of the abdomen reveals - No Hernias. Palpation/Percussion Palpation and Percussion of the abdomen reveal - Soft, Non Tender, No Rigidity (guarding), No hepatosplenomegaly and No Palpable abdominal masses.  Rectal Anorectal Exam External - Note: prolapsing RA hem. Internal - normal internal exam and normal sphincter tone.  Neurologic Neurologic evaluation reveals -alert and oriented x 3 with no impairment of recent or remote memory, normal attention span and ability to concentrate, normal sensation and normal coordination.  Musculoskeletal Normal Exam - Bilateral-Upper Extremity Strength Normal and Lower Extremity Strength Normal.   Results Leighton Ruff MD; 9/48/5462 9:26 AM) Procedures  Name Value Date ANOSCOPY, DIAGNOSTIC (70350) [ Hemorrhoids ] Procedure Other: Procedure: Anoscopy Surgeon: Marcello Moores After the risks and benefits were explained, verbal consent was obtained for above procedure. A medical assistant chaperone was present thoroughout the entire procedure. Anesthesia: none Diagnosis: rectal bleeding Findings: Grade 3 right anterior, grade 2 right posterior, grade 1 LL  Performed: 10/15/2016 9:24 AM    Assessment & Plan Leighton Ruff MD; 0/93/8182 9:32 AM)  PROLAPSED INTERNAL HEMORRHOIDS, GRADE 3 (X93.7) Impression: 66 year old female who presents to the office with daily rectal bleeding and physical exam confirms findings of a grade 3 right anterior internal hemorrhoid and a grade 2 right posterior internal hemorrhoid. I recommended hemorrhoidectomy for the right posterior hemorrhoid. We discussed possibly doing a hemorrhoid pexy at the same time for the right anterior hemorrhoid. We discussed that this will add a little more pain to the operation but she may not need any further procedures in the future. We discussed that she can definitely have recurrence of her hemorrhoids if she continues the habits that caused them  in the first place. She reports that she no longer has trouble with constipation and feels that she can continue these bowel habits after surgery. We discussed the risk of bleeding and that she should avoid heavy lifting for at least 48 hours after surgery.

## 2016-10-28 NOTE — Anesthesia Procedure Notes (Signed)
Procedure Name: MAC Date/Time: 10/28/2016 10:25 AM Performed by: Bethena Roys T Pre-anesthesia Checklist: Patient identified, Timeout performed, Emergency Drugs available, Suction available and Patient being monitored Patient Re-evaluated:Patient Re-evaluated prior to induction Oxygen Delivery Method: Nasal cannula Placement Confirmation: positive ETCO2

## 2016-10-29 ENCOUNTER — Encounter (HOSPITAL_BASED_OUTPATIENT_CLINIC_OR_DEPARTMENT_OTHER): Payer: Self-pay | Admitting: General Surgery

## 2016-11-17 DIAGNOSIS — J019 Acute sinusitis, unspecified: Secondary | ICD-10-CM | POA: Diagnosis not present

## 2016-11-17 DIAGNOSIS — R05 Cough: Secondary | ICD-10-CM | POA: Diagnosis not present

## 2016-11-17 DIAGNOSIS — Z6827 Body mass index (BMI) 27.0-27.9, adult: Secondary | ICD-10-CM | POA: Diagnosis not present

## 2016-11-17 DIAGNOSIS — J45901 Unspecified asthma with (acute) exacerbation: Secondary | ICD-10-CM | POA: Diagnosis not present

## 2016-12-17 DIAGNOSIS — R82998 Other abnormal findings in urine: Secondary | ICD-10-CM | POA: Diagnosis not present

## 2016-12-17 DIAGNOSIS — I10 Essential (primary) hypertension: Secondary | ICD-10-CM | POA: Diagnosis not present

## 2016-12-21 DIAGNOSIS — L988 Other specified disorders of the skin and subcutaneous tissue: Secondary | ICD-10-CM | POA: Diagnosis not present

## 2016-12-21 DIAGNOSIS — M859 Disorder of bone density and structure, unspecified: Secondary | ICD-10-CM | POA: Diagnosis not present

## 2016-12-21 DIAGNOSIS — Z0000 Encounter for general adult medical examination without abnormal findings: Secondary | ICD-10-CM | POA: Diagnosis not present

## 2016-12-21 DIAGNOSIS — F418 Other specified anxiety disorders: Secondary | ICD-10-CM | POA: Diagnosis not present

## 2016-12-21 DIAGNOSIS — K862 Cyst of pancreas: Secondary | ICD-10-CM | POA: Diagnosis not present

## 2016-12-21 DIAGNOSIS — I10 Essential (primary) hypertension: Secondary | ICD-10-CM | POA: Diagnosis not present

## 2016-12-21 DIAGNOSIS — K219 Gastro-esophageal reflux disease without esophagitis: Secondary | ICD-10-CM | POA: Diagnosis not present

## 2016-12-21 DIAGNOSIS — Z23 Encounter for immunization: Secondary | ICD-10-CM | POA: Diagnosis not present

## 2016-12-21 DIAGNOSIS — J45998 Other asthma: Secondary | ICD-10-CM | POA: Diagnosis not present

## 2016-12-25 ENCOUNTER — Encounter: Payer: Self-pay | Admitting: Emergency Medicine

## 2016-12-25 ENCOUNTER — Emergency Department
Admission: EM | Admit: 2016-12-25 | Discharge: 2016-12-25 | Disposition: A | Discharge status: Home / Self Care | Payer: Medicare HMO | Attending: Emergency Medicine | Admitting: Emergency Medicine

## 2016-12-25 ENCOUNTER — Emergency Department: Payer: Medicare HMO

## 2016-12-25 ENCOUNTER — Other Ambulatory Visit: Payer: Self-pay

## 2016-12-25 DIAGNOSIS — Z79899 Other long term (current) drug therapy: Secondary | ICD-10-CM | POA: Insufficient documentation

## 2016-12-25 DIAGNOSIS — J45909 Unspecified asthma, uncomplicated: Secondary | ICD-10-CM | POA: Insufficient documentation

## 2016-12-25 DIAGNOSIS — M549 Dorsalgia, unspecified: Secondary | ICD-10-CM | POA: Diagnosis not present

## 2016-12-25 DIAGNOSIS — E860 Dehydration: Secondary | ICD-10-CM | POA: Diagnosis not present

## 2016-12-25 DIAGNOSIS — Z8673 Personal history of transient ischemic attack (TIA), and cerebral infarction without residual deficits: Secondary | ICD-10-CM | POA: Insufficient documentation

## 2016-12-25 DIAGNOSIS — Z7982 Long term (current) use of aspirin: Secondary | ICD-10-CM | POA: Diagnosis not present

## 2016-12-25 DIAGNOSIS — I10 Essential (primary) hypertension: Secondary | ICD-10-CM | POA: Diagnosis not present

## 2016-12-25 DIAGNOSIS — G8929 Other chronic pain: Secondary | ICD-10-CM | POA: Diagnosis not present

## 2016-12-25 DIAGNOSIS — R42 Dizziness and giddiness: Secondary | ICD-10-CM

## 2016-12-25 LAB — CBC
HCT: 46.2 % (ref 35.0–47.0)
Hemoglobin: 15.7 g/dL (ref 12.0–16.0)
MCH: 31.6 pg (ref 26.0–34.0)
MCHC: 34.1 g/dL (ref 32.0–36.0)
MCV: 92.8 fL (ref 80.0–100.0)
PLATELETS: 219 10*3/uL (ref 150–440)
RBC: 4.98 MIL/uL (ref 3.80–5.20)
RDW: 13.9 % (ref 11.5–14.5)
WBC: 5.1 10*3/uL (ref 3.6–11.0)

## 2016-12-25 LAB — BASIC METABOLIC PANEL
Anion gap: 7 (ref 5–15)
BUN: 11 mg/dL (ref 6–20)
CHLORIDE: 108 mmol/L (ref 101–111)
CO2: 24 mmol/L (ref 22–32)
CREATININE: 0.66 mg/dL (ref 0.44–1.00)
Calcium: 9.1 mg/dL (ref 8.9–10.3)
GFR calc Af Amer: >60 mL/min (ref >60)
GFR calc non Af Amer: >60 mL/min (ref >60)
GLUCOSE: 147 mg/dL — AB (ref 65–99)
Potassium: 3.6 mmol/L (ref 3.5–5.1)
Sodium: 139 mmol/L (ref 135–145)

## 2016-12-25 LAB — URINALYSIS, COMPLETE (UACMP) WITH MICROSCOPIC
Bacteria, UA: NONE SEEN
Bilirubin Urine: NEGATIVE
Glucose, UA: NEGATIVE mg/dL
Hgb urine dipstick: NEGATIVE
KETONES UR: NEGATIVE mg/dL
Leukocytes, UA: NEGATIVE
Nitrite: NEGATIVE
PH: 5 (ref 5.0–8.0)
Protein, ur: NEGATIVE mg/dL
Specific Gravity, Urine: 1.021 (ref 1.005–1.030)

## 2016-12-25 MED ORDER — SODIUM CHLORIDE 0.9 % IV BOLUS (SEPSIS)
1000.0000 mL | Freq: Once | INTRAVENOUS | Status: AC
  Administered 2016-12-25: 1000 mL via INTRAVENOUS

## 2016-12-25 NOTE — ED Provider Notes (Signed)
[facility] Emergency Department Provider Note  ____________________________________________   First MD Initiated Contact with Patient 12/25/16 1338     (approximate)  I have reviewed the triage vital signs and the nursing notes.   HISTORY  Chief Complaint Dizziness   HPI Pamela Daniel is a 66 y.o. female with roughly 24 hours of intermittent lightheadedness.  She says that she has had vertigo in the past and this distinctly does not feel like vertigo.  She denies room spinning.  She denies headache double vision or blurred vision.  Symptoms are worsened by standing up suddenly and worsened when walking.  The last 2-3 seconds at a time and then resolved.  She has not fallen.  Symptoms are moderate to severe intermittent and short.  Past Medical History:  Diagnosis Date  . Anxiety   . Arthritis    SPINE  . Asthma   . First degree heart block   . Frequency of urination   . Hemorrhoids   . Hiatal hernia    POST RESIDUAL SMALL HH PER IMAGING  . History of acute pancreatitis 03/16/2015  . History of adenomatous polyp of colon    tubular adenoma's 04-08-2014/   hyperplastic bening polyp 2000  . History of concussion 2012   no residual  . History of transient ischemic attack (TIA) 12/22/2011   no residual  . History of vaginal dysplasia 2016  . Hypertension   . Pseudocyst of pancreas   . PSVT (paroxysmal supraventricular tachycardia) ([place])    cardioloigst-  dr Einar Gip--- last visit 2013 per pt and is currently followed by pcp  . Rectal bleeding   . S/P AV (atrioventricular) nodal ablation 01-21-2010    dr Caryl Comes  . Simple renal cyst    bilateral per imaging  . Urgency of urination     Patient Active Problem List   Diagnosis Date Noted  . Acute pancreatitis   . Cough   . Pancreatitis, acute   . Epigastric abdominal pain   . Pancreatitis 03/16/2015  . VAIN I (vaginal intraepithelial neoplasia grade I) 04/17/2014  . TIA (transient ischemic  attack) 12/22/2011  . SVT/ PSVT/ PAT 01/13/2010  . RECTAL BLEEDING 08/01/2007  . DIARRHEA 08/01/2007  . ESOPHAGEAL STRICTURE 07/31/2007  . HIATAL HERNIA 07/31/2007  . COLONIC POLYPS, HX OF 07/31/2007  . HYPERTENSION 03/14/2007  . G E R D 03/14/2007    Past Surgical History:  Procedure Laterality Date  . CARDIAC ELECTROPHYSIOLOGY MAPPING AND ABLATION  01-21-2010   dr Caryl Comes   ablation AVNRT  . FOOT NEUROMA SURGERY Left 1996  . HEMORRHOID SURGERY N/A 10/28/2016   Procedure: HEMORRHOIDECTOMY AND HEMORRHOIDAL PEXY;  Surgeon: Leighton Ruff, MD;  Location: [facility];  Service: General;  Laterality: N/A;  . LAPAROSCOPY TAKEDOWN AND REPAIR HIATAL HERNIA /  NISSEN FUNDOPLATION  09-01-2005    dr Hassell Done  [facility]  . NASAL SEPTUM SURGERY  1991  . TEE WITHOUT CARDIOVERSION  02/15/2012   Procedure: TRANSESOPHAGEAL ECHOCARDIOGRAM (TEE);  Surgeon: Laverda Page, MD;  Location: [facility] ENDOSCOPY;  Service: Cardiovascular;  Laterality: N/A;  normal LV, normal EF, mild MR, trace TR, trace PI  . TRANSTHORACIC ECHOCARDIOGRAM  12-22-2011    dr Einar Gip   normal echo  . VAGINAL HYSTERECTOMY  1996   w/  BSO    Prior to Admission medications   Medication Sig Start Date End Date Taking? Authorizing Provider  acetaminophen (TYLENOL) 325 MG tablet Take 650 mg by mouth every 6 (six) hours  as needed for mild pain.    [provider]  albuterol (PROVENTIL HFA;VENTOLIN HFA) 108 (90 Base) MCG/ACT inhaler Inhale 2 puffs into the lungs every 4 (four) hours as needed for wheezing or shortness of breath. Shortness of breath/wheezing 04/06/15   Margaretann Loveless, MD  albuterol (PROVENTIL) (2.5 MG/3ML) 0.083% nebulizer solution Take 6 mLs (5 mg total) by nebulization once. 04/06/15   Margaretann Loveless, MD  ALPRAZolam Duanne Moron) 0.25 MG tablet Take 0.25 mg by mouth at bedtime as needed for anxiety.    [provider]  aspirin 325 MG tablet Take 325 mg by mouth daily.    [provider]    cyclobenzaprine (FLEXERIL) 10 MG tablet Take 10 mg by mouth 3 (three) times daily as needed for muscle spasms.    [provider]  oxyCODONE (OXY IR/ROXICODONE) 5 MG immediate release tablet Take 1-2 tablets (5-10 mg total) by mouth every 6 (six) hours as needed. 79/89/21   Leighton Ruff, MD  triamcinolone cream (KENALOG) 0.1 % Apply 1 application topically 2 (two) times daily as needed.     [provider]    Allergies Patient has no known allergies.  Family History  Problem Relation Age of Onset  . Heart disease Mother 62       Heart failure >> death at age 93  . Dementia Mother   . Hypertension Mother   . Cancer Mother        Breast and colon  . Breast cancer Mother 37  . Heart disease Father 35       MI  . COPD Father   . Heart disease Brother   . Mental illness Brother   . Cancer Brother 25       Lung and bone  . Cancer Brother        Lung and bone  . Cancer Brother        Colon    Social History Social History   Tobacco Use  . Smoking status: Never Smoker  . Smokeless tobacco: Never Used  Substance Use Topics  . Alcohol use: No  . Drug use: No    Review of Systems Constitutional: No fever/chills Eyes: No visual changes. ENT: No sore throat. Cardiovascular: Denies chest pain. Respiratory: Denies shortness of breath. Gastrointestinal: No abdominal pain.  No nausea, no vomiting.  No diarrhea.  No constipation. Genitourinary: Negative for dysuria. Musculoskeletal: Negative for back pain. Skin: Negative for rash. Neurological: Negative for headaches, focal weakness or numbness.   ____________________________________________   PHYSICAL EXAM:  VITAL SIGNS: ED Triage Vitals  Enc Vitals Group     BP 12/25/16 1159 135/70     Pulse --      Resp 12/25/16 1159 18     Temp 12/25/16 1159 98.4 F (36.9 C)     Temp Source 12/25/16 1159 Oral     SpO2 12/25/16 1159 93 %     Weight 12/25/16 1159 167 lb (75.8 kg)     Height 12/25/16 1159 5'  1" (1.549 m)     Head Circumference --      Peak Flow --      Pain Score 12/25/16 1217 2     Pain Loc --      Pain Edu? --      Excl. in [place]? --     Constitutional: Alert and oriented x4 pleasant cooperative speaks in full clear sentences no diaphoresis Eyes: PERRL EOMI. no nystagmus Head: Atraumatic. Nose: No congestion/rhinnorhea. Mouth/Throat: No trismus Neck:  No stridor.   Cardiovascular: Normal rate, regular rhythm. Grossly normal heart sounds.  Good peripheral circulation. Respiratory: Normal respiratory effort.  No retractions. Lungs CTAB and moving good air Gastrointestinal: Soft nontender Musculoskeletal: No lower extremity edema   Neurologic:  Normal speech and language. No gross focal neurologic deficits are appreciated. Skin:  Skin is warm, dry and intact. No rash noted. Psychiatric: Mood and affect are normal. Speech and behavior are normal.    ____________________________________________   DIFFERENTIAL includes but not limited to  Central vertigo, peripheral vertigo, dehydration, lightheaded, stroke, intracerebral hemorrhage ____________________________________________   LABS (all labs ordered are listed, but only abnormal results are displayed)  Labs Reviewed  BASIC METABOLIC PANEL - Abnormal; Notable for the following components:      Result Value   Glucose, Bld 147 (*)    All other components within normal limits  URINALYSIS, COMPLETE (UACMP) WITH MICROSCOPIC - Abnormal; Notable for the following components:   Color, Urine YELLOW (*)    APPearance CLEAR (*)    Squamous Epithelial / LPF 0-5 (*)    All other components within normal limits  CBC    Blood work reviewed by me with no acute disease __________________________________________  EKG  ED ECG REPORT I, Darel Hong, the attending physician, personally viewed and interpreted this ECG.  Date: 12/25/2016 EKG Time:  Rate: 77 Rhythm: normal sinus rhythm QRS Axis: normal Intervals:  normal ST/T Wave abnormalities: normal Narrative Interpretation: no evidence of acute ischemia  ____________________________________________  RADIOLOGY  Head CT reviewed by me with no acute disease ____________________________________________   PROCEDURES  Procedure(s) performed: no  Procedures  Critical Care performed: no  Observation: no ____________________________________________   INITIAL IMPRESSION / ASSESSMENT AND PLAN / ED COURSE  Pertinent labs & imaging results that were available during my care of the patient were reviewed by me and considered in my medical decision making (see chart for details).  The patient arrives neurologically intact and hemodynamically stable.  She has no nystagmus and is able to get up and walk and even perform heel to toe walk and a Romberg without difficulty.  Her symptoms are not consistent with vertigo.  Head CT is fortunately negative.  She does feel improved after IV fluids.  I discussed the patient the diagnostic uncertainty but that I believe she stable for outpatient management.  She verbalizes understanding and agreement with the plan.  Strict return precautions given.      ____________________________________________   FINAL CLINICAL IMPRESSION(S) / ED DIAGNOSES  Final diagnoses:  Dizziness  Dehydration  Chronic back pain, unspecified back location, unspecified back pain laterality      NEW MEDICATIONS STARTED DURING THIS VISIT:  This SmartLink is deprecated. Use AVSMEDLIST instead to display the medication list for a patient.   Note:  This document was prepared using Dragon voice recognition software and may include unintentional dictation errors.     Darel Hong, MD 12/26/16 [phone]

## 2016-12-25 NOTE — ED Notes (Signed)
Patient transported to CT 

## 2016-12-25 NOTE — Discharge Instructions (Signed)
Please make sure you remain well-hydrated and make an appointment to establish care with neurosurgery for reevaluation of your back.  Return to the emergency department for any concerns whatsoever.  It was a pleasure to take care of you today, and thank you for coming to our emergency department.  If you have any questions or concerns before leaving please ask the nurse to grab me and I'm more than happy to go through your aftercare instructions again.  If you were prescribed any opioid pain medication today such as Norco, Vicodin, Percocet, morphine, hydrocodone, or oxycodone please make sure you do not drive when you are taking this medication as it can alter your ability to drive safely.  If you have any concerns once you are home that you are not improving or are in fact getting worse before you can make it to your follow-up appointment, please do not hesitate to call [phone] and come back for further evaluation.  Darel Hong, MD  Results for orders placed or performed during the hospital encounter of 02/72/53  Basic metabolic panel  Result Value Ref Range   Sodium 139 135 - 145 mmol/L   Potassium 3.6 3.5 - 5.1 mmol/L   Chloride 108 101 - 111 mmol/L   CO2 24 22 - 32 mmol/L   Glucose, Bld 147 (H) 65 - 99 mg/dL   BUN 11 6 - 20 mg/dL   Creatinine, Ser 0.66 0.44 - 1.00 mg/dL   Calcium 9.1 8.9 - 10.3 mg/dL   GFR calc non Af Amer >60 >60 mL/min   GFR calc Af Amer >60 >60 mL/min   Anion gap 7 5 - 15  CBC  Result Value Ref Range   WBC 5.1 3.6 - 11.0 K/uL   RBC 4.98 3.80 - 5.20 MIL/uL   Hemoglobin 15.7 12.0 - 16.0 g/dL   HCT 46.2 35.0 - 47.0 %   MCV 92.8 80.0 - 100.0 fL   MCH 31.6 26.0 - 34.0 pg   MCHC 34.1 32.0 - 36.0 g/dL   RDW 13.9 11.5 - 14.5 %   Platelets 219 150 - 440 K/uL  Urinalysis, Complete w Microscopic  Result Value Ref Range   Color, Urine YELLOW (A) YELLOW   APPearance CLEAR (A) CLEAR   Specific Gravity, Urine 1.021 1.005 - 1.030   pH 5.0 5.0 - 8.0   Glucose, UA  NEGATIVE NEGATIVE mg/dL   Hgb urine dipstick NEGATIVE NEGATIVE   Bilirubin Urine NEGATIVE NEGATIVE   Ketones, ur NEGATIVE NEGATIVE mg/dL   Protein, ur NEGATIVE NEGATIVE mg/dL   Nitrite NEGATIVE NEGATIVE   Leukocytes, UA NEGATIVE NEGATIVE   RBC / HPF 0-5 0 - 5 RBC/hpf   WBC, UA 0-5 0 - 5 WBC/hpf   Bacteria, UA NONE SEEN NONE SEEN   Squamous Epithelial / LPF 0-5 (A) NONE SEEN   Mucus PRESENT    Ct Head Wo Contrast  Result Date: 12/25/2016 CLINICAL DATA:  Dizziness for approximately 2 weeks EXAM: CT HEAD WITHOUT CONTRAST TECHNIQUE: Contiguous axial images were obtained from the base of the skull through the vertex without intravenous contrast. COMPARISON:  Head CT December 21, 2011 and brain MRI December 29, 2011 FINDINGS: Brain: The ventricles are normal in size and configuration. There is no intracranial mass, hemorrhage, extra-axial fluid collection, or midline shift. Gray-white compartments appear normal. No acute infarct evident. Vascular: There is no hyperdense vessel. There is no appreciable vascular calcification. Skull: The bony calvarium appears intact. Sinuses/Orbits: There is mild mucosal thickening in several ethmoid  air cells. There is patchy opacity in each sphenoid sinus. Other visualized paranasal sinuses are clear. Visualized orbits appear symmetric bilaterally. Other: Mastoid air cells are clear. Note that mastoids are somewhat hypoplastic on the right. IMPRESSION: Areas of paranasal sinus disease.  Study otherwise unremarkable. Electronically Signed   By: Lowella Grip III M.D.   On: 12/25/2016 14:00

## 2016-12-25 NOTE — ED Triage Notes (Signed)
C/O dizziness.  Onset of symptoms x 2 weeks.  Also c/o pain to back, arms, hips then nausea starts.  The morning after patient has pain episode, patient c/o dizziness.

## 2016-12-29 ENCOUNTER — Other Ambulatory Visit: Payer: Self-pay | Admitting: Internal Medicine

## 2016-12-29 DIAGNOSIS — Z1231 Encounter for screening mammogram for malignant neoplasm of breast: Secondary | ICD-10-CM

## 2017-01-04 ENCOUNTER — Other Ambulatory Visit: Payer: Self-pay | Admitting: *Deleted

## 2017-01-04 NOTE — Patient Outreach (Signed)
[place] [facility]) Care Management  01/04/2017  Pamela Daniel [birth year] [id number]  [facility] Care Management criteria for ED census screening calls in 6 ED visits in 6 months.  This patient does not meet this criteria; case is being closed.  Plan: Send to case management assistant for case closure.  Sherrin Daisy, RN BSN [facility] Management Coordinator [facility] Care Management  [phone]

## 2017-01-13 DIAGNOSIS — M79604 Pain in right leg: Secondary | ICD-10-CM | POA: Diagnosis not present

## 2017-01-13 DIAGNOSIS — G8929 Other chronic pain: Secondary | ICD-10-CM | POA: Diagnosis not present

## 2017-01-13 DIAGNOSIS — R42 Dizziness and giddiness: Secondary | ICD-10-CM | POA: Diagnosis not present

## 2017-01-13 DIAGNOSIS — M25551 Pain in right hip: Secondary | ICD-10-CM | POA: Diagnosis not present

## 2017-01-13 DIAGNOSIS — R2689 Other abnormalities of gait and mobility: Secondary | ICD-10-CM | POA: Diagnosis not present

## 2017-01-13 DIAGNOSIS — M542 Cervicalgia: Secondary | ICD-10-CM | POA: Diagnosis not present

## 2017-01-13 DIAGNOSIS — R51 Headache: Secondary | ICD-10-CM | POA: Diagnosis not present

## 2017-01-13 DIAGNOSIS — M545 Low back pain: Secondary | ICD-10-CM | POA: Diagnosis not present

## 2017-01-13 DIAGNOSIS — M79605 Pain in left leg: Secondary | ICD-10-CM | POA: Diagnosis not present

## 2017-01-14 ENCOUNTER — Other Ambulatory Visit: Payer: Self-pay | Admitting: Student

## 2017-01-14 DIAGNOSIS — G8929 Other chronic pain: Secondary | ICD-10-CM

## 2017-01-14 DIAGNOSIS — M545 Low back pain: Secondary | ICD-10-CM

## 2017-01-14 DIAGNOSIS — M542 Cervicalgia: Secondary | ICD-10-CM

## 2017-01-24 ENCOUNTER — Ambulatory Visit
Admission: RE | Admit: 2017-01-24 | Discharge: 2017-01-24 | Disposition: A | Discharge status: Home / Self Care | Payer: Medicare HMO | Source: Ambulatory Visit | Attending: Student | Admitting: Student

## 2017-01-24 DIAGNOSIS — G8929 Other chronic pain: Secondary | ICD-10-CM

## 2017-01-24 DIAGNOSIS — R2689 Other abnormalities of gait and mobility: Secondary | ICD-10-CM | POA: Diagnosis not present

## 2017-01-24 DIAGNOSIS — M5136 Other intervertebral disc degeneration, lumbar region: Secondary | ICD-10-CM | POA: Insufficient documentation

## 2017-01-24 DIAGNOSIS — M545 Low back pain: Secondary | ICD-10-CM | POA: Diagnosis not present

## 2017-01-24 DIAGNOSIS — M25551 Pain in right hip: Secondary | ICD-10-CM | POA: Insufficient documentation

## 2017-01-24 DIAGNOSIS — M25552 Pain in left hip: Secondary | ICD-10-CM | POA: Diagnosis not present

## 2017-01-24 DIAGNOSIS — M4316 Spondylolisthesis, lumbar region: Secondary | ICD-10-CM | POA: Insufficient documentation

## 2017-01-24 DIAGNOSIS — R42 Dizziness and giddiness: Secondary | ICD-10-CM | POA: Insufficient documentation

## 2017-01-24 DIAGNOSIS — M79604 Pain in right leg: Secondary | ICD-10-CM | POA: Diagnosis not present

## 2017-01-24 DIAGNOSIS — M4802 Spinal stenosis, cervical region: Secondary | ICD-10-CM | POA: Diagnosis not present

## 2017-01-24 DIAGNOSIS — M542 Cervicalgia: Secondary | ICD-10-CM

## 2017-01-24 DIAGNOSIS — M79605 Pain in left leg: Secondary | ICD-10-CM | POA: Insufficient documentation

## 2017-01-26 ENCOUNTER — Ambulatory Visit: Payer: Medicare HMO

## 2017-01-28 ENCOUNTER — Ambulatory Visit: Payer: Medicare HMO

## 2017-01-28 ENCOUNTER — Ambulatory Visit
Admission: RE | Admit: 2017-01-28 | Discharge: 2017-01-28 | Disposition: A | Discharge status: Home / Self Care | Payer: Medicare HMO | Source: Ambulatory Visit | Attending: Internal Medicine | Admitting: Internal Medicine

## 2017-01-28 DIAGNOSIS — Z1231 Encounter for screening mammogram for malignant neoplasm of breast: Secondary | ICD-10-CM

## 2017-02-07 ENCOUNTER — Ambulatory Visit
Payer: Medicare HMO | Attending: Student in an Organized Health Care Education/Training Program | Admitting: Student in an Organized Health Care Education/Training Program

## 2017-02-07 NOTE — Progress Notes (Deleted)
Nursing Pain Medication Assessment:  Safety precautions to be maintained throughout the outpatient stay will include: orient to surroundings, keep bed in low position, maintain call bell within reach at all times, provide assistance with transfer out of bed and ambulation.  Medication Inspection Compliance: Ms. Arnall did not comply with our request to bring her pills to be counted. She was reminded that bringing the medication bottles, even when empty, is a requirement.  Medication: None brought in. Pill/Patch Count: None available to be counted. Bottle Appearance: No container available. Did not bring bottle(s) to appointment. Filled Date: N/A Last Medication intake:  {Blank single:19197::"Ran out of medicine more than 48 hours ago","Day before yesterday","Yesterday","Today"}

## 2017-02-15 DIAGNOSIS — M545 Low back pain: Secondary | ICD-10-CM | POA: Diagnosis not present

## 2017-02-15 DIAGNOSIS — M542 Cervicalgia: Secondary | ICD-10-CM | POA: Diagnosis not present

## 2017-02-16 ENCOUNTER — Other Ambulatory Visit: Payer: Self-pay

## 2017-02-16 ENCOUNTER — Ambulatory Visit
Payer: Medicare HMO | Attending: Student in an Organized Health Care Education/Training Program | Admitting: Student in an Organized Health Care Education/Training Program

## 2017-02-16 ENCOUNTER — Encounter: Payer: Self-pay | Admitting: Student in an Organized Health Care Education/Training Program

## 2017-02-16 VITALS — BP 143/68 | HR 93 | Temp 98.2°F | Resp 16 | Ht 61.0 in | Wt 167.0 lb

## 2017-02-16 DIAGNOSIS — M47816 Spondylosis without myelopathy or radiculopathy, lumbar region: Secondary | ICD-10-CM

## 2017-02-16 DIAGNOSIS — M4722 Other spondylosis with radiculopathy, cervical region: Secondary | ICD-10-CM | POA: Insufficient documentation

## 2017-02-16 DIAGNOSIS — M545 Low back pain: Secondary | ICD-10-CM | POA: Diagnosis present

## 2017-02-16 DIAGNOSIS — Z9889 Other specified postprocedural states: Secondary | ICD-10-CM | POA: Diagnosis not present

## 2017-02-16 DIAGNOSIS — M5136 Other intervertebral disc degeneration, lumbar region: Secondary | ICD-10-CM | POA: Insufficient documentation

## 2017-02-16 DIAGNOSIS — E785 Hyperlipidemia, unspecified: Secondary | ICD-10-CM | POA: Diagnosis not present

## 2017-02-16 DIAGNOSIS — M1288 Other specific arthropathies, not elsewhere classified, other specified site: Secondary | ICD-10-CM | POA: Diagnosis not present

## 2017-02-16 DIAGNOSIS — N281 Cyst of kidney, acquired: Secondary | ICD-10-CM | POA: Insufficient documentation

## 2017-02-16 DIAGNOSIS — Z8673 Personal history of transient ischemic attack (TIA), and cerebral infarction without residual deficits: Secondary | ICD-10-CM | POA: Insufficient documentation

## 2017-02-16 DIAGNOSIS — J45909 Unspecified asthma, uncomplicated: Secondary | ICD-10-CM | POA: Insufficient documentation

## 2017-02-16 DIAGNOSIS — Z7982 Long term (current) use of aspirin: Secondary | ICD-10-CM | POA: Insufficient documentation

## 2017-02-16 DIAGNOSIS — M47812 Spondylosis without myelopathy or radiculopathy, cervical region: Secondary | ICD-10-CM

## 2017-02-16 DIAGNOSIS — M4316 Spondylolisthesis, lumbar region: Secondary | ICD-10-CM | POA: Diagnosis not present

## 2017-02-16 DIAGNOSIS — M542 Cervicalgia: Secondary | ICD-10-CM

## 2017-02-16 DIAGNOSIS — I471 Supraventricular tachycardia: Secondary | ICD-10-CM | POA: Insufficient documentation

## 2017-02-16 DIAGNOSIS — K219 Gastro-esophageal reflux disease without esophagitis: Secondary | ICD-10-CM | POA: Insufficient documentation

## 2017-02-16 DIAGNOSIS — M51369 Other intervertebral disc degeneration, lumbar region without mention of lumbar back pain or lower extremity pain: Secondary | ICD-10-CM

## 2017-02-16 DIAGNOSIS — G894 Chronic pain syndrome: Secondary | ICD-10-CM

## 2017-02-16 DIAGNOSIS — M4802 Spinal stenosis, cervical region: Secondary | ICD-10-CM | POA: Insufficient documentation

## 2017-02-16 DIAGNOSIS — F419 Anxiety disorder, unspecified: Secondary | ICD-10-CM | POA: Diagnosis not present

## 2017-02-16 DIAGNOSIS — I10 Essential (primary) hypertension: Secondary | ICD-10-CM | POA: Diagnosis not present

## 2017-02-16 NOTE — Progress Notes (Signed)
Safety precautions to be maintained throughout the outpatient stay will include: orient to surroundings, keep bed in low position, maintain call bell within reach at all times, provide assistance with transfer out of bed and ambulation.  

## 2017-02-16 NOTE — Progress Notes (Signed)
Patient's Name: Pamela Daniel  MRN: [id number]  Referring Provider: Velna Hatchet, MD  DOB: 13-Jun-1950  PCP: Velna Hatchet, MD  DOS: 02/16/2017  Note by: Gillis Santa, MD  Service setting: Ambulatory outpatient  Specialty: Interventional Pain Management  Location: [facility] (AMB) Pain Management Facility  Visit type: Initial Patient Evaluation  Patient type: New Patient   Primary Reason(s) for Visit: Encounter for initial evaluation of one or more chronic problems (new to examiner) potentially causing chronic pain, and posing a threat to normal musculoskeletal function. (Level of risk: High) CC: Back Pain (lower)  HPI  Ms. Pamela Daniel is a 67 y.o. year old, female patient, who comes today to see [place] for the first time for an initial evaluation of her chronic pain. She has HYPERTENSION; ESOPHAGEAL STRICTURE; G E R D; HIATAL HERNIA; RECTAL BLEEDING; DIARRHEA; COLONIC POLYPS, HX OF; SVT/ PSVT/ PAT; TIA (transient ischemic attack); VAIN I (vaginal intraepithelial neoplasia grade I); Pancreatitis; Pancreatitis, acute; Epigastric abdominal pain; Acute pancreatitis; and Cough on their problem list. Today she comes in for evaluation of her Back Pain (lower)  Pain Assessment: Location: Lower Back Radiating: both hips, front of both upper  legs Onset: More than a month ago Duration: Chronic pain Quality: Dull, Aching Severity: 4 /10 (self-reported pain score)  Note: Reported level is compatible with observation.                         When using our objective Pain Scale, levels between 6 and 10/10 are said to belong in an emergency room, as it progressively worsens from a 6/10, described as severely limiting, requiring emergency care not usually available at an outpatient pain management facility. At a 6/10 level, communication becomes difficult and requires great effort. Assistance to reach the emergency department may be required. Facial flushing and profuse sweating along with potentially dangerous  increases in heart rate and blood pressure will be evident. Effect on ADL:   Timing: Constant Modifying factors: stretching, Ibuprofen  Onset and Duration: Present longer than 3 months Cause of pain: Unknown Severity: Getting worse, NAS-11 at its worse: 10/10 and NAS-11 at its best: 3/10 Timing: Not influenced by the time of the day Aggravating Factors: Motion and Twisting Alleviating Factors: Bending, Stretching, Sitting, Walking and working,twisting,squatting,lifting,climbing Associated Problems: Night-time cramps, Depression, Dizziness, Fatigue, Inability to concentrate, Sadness, Spasms and Swelling Quality of Pain: Aching, Agonizing, Deep, Nagging, Pressure-like and Uncomfortable Previous Examinations or Tests: Bone scan, CT scan, Endoscopy, MRI scan, X-rays, Neurological evaluation and Orthopedic evaluation Previous Treatments: Chiropractic manipulations, Epidural steroid injections, Physical Therapy and Stretching exercises  The patient comes into the clinics today for the first time for a chronic pain management evaluation.   67 year old female with a chief complaint of axial low back and hip pain that occasionally radiates into the posterior thighs but rarely beyond her knees.  Patient also endorses neck pain that is throbbing in nature and radiates to bilateral shoulders and trapezius.  Patient also endorses a headache that originates in her upper neck and posterior scalp region and radiates to the front portion of her head.  She finds this headache very debilitating.  She does endorse intermittent hand numbness at times.  The headaches are also accompanied by dizziness and feeling that her head is going to "explode.  These headaches are worsened with cervical extension.  Patient has previously seen neurosurgery.  Lumbar and cervical MRIs were obtained.  Patient is currently undergoing physical therapy for her neck pain.  Her last session was yesterday.  She has had epidural steroid  injections on in the past, over 20 years ago.  She states that her left leg sometimes more painful than her right leg.  No obvious weakness noted.  Patient is here for fast track evaluation.  Today I took the time to provide the patient with information regarding my pain practice. The patient was informed that my practice is divided into two sections: an interventional pain management section, as well as a completely separate and distinct medication management section. I explained that I have procedure days for my interventional therapies, and evaluation days for follow-ups and medication management. Because of the amount of documentation required during both, they are kept separated. This means that there is the possibility that she may be scheduled for a procedure on one day, and medication management the next. I have also informed her that because of staffing and facility limitations, I no longer take patients for medication management only. To illustrate the reasons for this, I gave the patient the example of surgeons, and how inappropriate it would be to refer a patient to his/her care, just to write for the post-surgical antibiotics on a surgery done by a different surgeon.   Because interventional pain management is my board-certified specialty, the patient was informed that joining my practice means that they are open to any and all interventional therapies. I made it clear that this does not mean that they will be forced to have any procedures done. What this means is that I believe interventional therapies to be essential part of the diagnosis and proper management of chronic pain conditions. Therefore, patients not interested in these interventional alternatives will be better served under the care of a different practitioner.  The patient was also made aware of my Comprehensive Pain Management Safety Guidelines where by joining my practice, they limit all of their nerve blocks and joint  injections to those done by our practice, for as long as we are retained to manage their care.   Meds   Current Outpatient Medications:  .  acetaminophen (TYLENOL) 325 MG tablet, Take 650 mg by mouth every 6 (six) hours as needed for mild pain., Disp: , Rfl:  .  albuterol (PROVENTIL) (2.5 MG/3ML) 0.083% nebulizer solution, Take 6 mLs (5 mg total) by nebulization once., Disp: 75 mL, Rfl: 12 .  ALPRAZolam (XANAX) 0.25 MG tablet, Take 0.25 mg by mouth at bedtime as needed for anxiety., Disp: , Rfl:  .  aspirin 325 MG tablet, Take 325 mg by mouth daily., Disp: , Rfl:  .  budesonide-formoterol (SYMBICORT) 160-4.5 MCG/ACT inhaler, Inhale 2 puffs into the lungs 2 (two) times daily., Disp: , Rfl:  .  cyclobenzaprine (FLEXERIL) 10 MG tablet, Take 10 mg by mouth 3 (three) times daily as needed for muscle spasms., Disp: , Rfl:  .  albuterol (PROVENTIL HFA;VENTOLIN HFA) 108 (90 Base) MCG/ACT inhaler, Inhale 2 puffs into the lungs every 4 (four) hours as needed for wheezing or shortness of breath. Shortness of breath/wheezing (Patient not taking: Reported on 02/16/2017), Disp: 1 Inhaler, Rfl: 2 .  oxyCODONE (OXY IR/ROXICODONE) 5 MG immediate release tablet, Take 1-2 tablets (5-10 mg total) by mouth every 6 (six) hours as needed. (Patient not taking: Reported on 02/16/2017), Disp: 30 tablet, Rfl: 0 .  triamcinolone cream (KENALOG) 0.1 %, Apply 1 application topically 2 (two) times daily as needed. , Disp: , Rfl:   Imaging Review  Cervical Imaging: Cervical MR wo contrast:  Results  for orders placed during the hospital encounter of 01/24/17  MR CERVICAL SPINE WO CONTRAST   Narrative CLINICAL DATA:  Neck pain  EXAM: MRI CERVICAL SPINE WITHOUT CONTRAST  TECHNIQUE: Multiplanar, multisequence MR imaging of the cervical spine was performed. No intravenous contrast was administered.  COMPARISON:  None.  FINDINGS: Alignment: Normal  Vertebrae: Negative for fracture or mass  Cord: Normal signal and  morphology  Posterior Fossa, vertebral arteries, paraspinal tissues: Negative  Disc levels:  C2-3:  Negative  C3-4: Mild facet degeneration on the left. No significant spinal or foraminal stenosis.  C4-5: Disc degeneration with mild uncinate spurring bilaterally. Mild facet hypertrophy bilaterally. Mild spinal and foraminal stenosis bilaterally due to spurring  C5-6:  Mild degenerative change without stenosis  C6-7:  Mild degenerative change without stenosis  C7-T1:  Negative  IMPRESSION: Mild cervical spine degenerative change. Mild spinal and foraminal stenosis bilaterally at C4-5 due to spurring.   Electronically Signed   By: Franchot Gallo M.D.   On: 01/24/2017 10:12      Lumbosacral Imaging: Lumbar MR wo contrast:  Results for orders placed during the hospital encounter of 01/24/17  MR LUMBAR SPINE WO CONTRAST   Narrative CLINICAL DATA:  Chronic low back pain  EXAM: MRI LUMBAR SPINE WITHOUT CONTRAST  TECHNIQUE: Multiplanar, multisequence MR imaging of the lumbar spine was performed. No intravenous contrast was administered.  COMPARISON:  CT abdomen pelvis 09/10/2016, MRI lumbar spine 11/23/2006  FINDINGS: Segmentation: Normal segmentation. After review of the prior CT abdomen pelvis, there are small ribs at T12. Therefore L5 is a transitional vertebra, largely incorporated into the sacrum. This places grade 1 anterolisthesis at L4-5. This numbering appears more accurate however is a change from the prior MRI report 2008.  Alignment:  7 mm anterolisthesis L4-5.  Remaining alignment normal  Vertebrae:  Negative for fracture or mass.  Conus medullaris and cauda equina: Conus extends to the mid L1 level. Conus and cauda equina appear normal.  Paraspinal and other soft tissues: Small renal cysts bilaterally. No retroperitoneal mass  Disc levels:  T12-L1:  Negative  L1-2:  Negative  L2-3:  Mild disc and facet degeneration without  stenosis  L3-4: Severe facet degeneration with bony overgrowth and prominent bilateral facet joint effusions, progressive since 2008. No significant spinal or foraminal stenosis.  L4-5: Grade 1 anterolisthesis with severe facet degeneration. Mild left foraminal narrowing without neural compression. Spinal canal adequate in size  L5-S1: Rudimentary disc space without degenerative change or stenosis.  IMPRESSION: Based on small ribs at T12, L5 is incorporated into the sacrum.  Grade 1 anterolisthesis L4-5 with severe facet degeneration and no significant spinal stenosis  Severe facet degeneration L3-4 has progressed in the interval without significant spinal stenosis.   Electronically Signed   By: Franchot Gallo M.D.   On: 01/24/2017 10:09    Lumbar MR w/wo contrast:  Results for orders placed during the hospital encounter of 11/23/06  MR Lumbar Spine W Wo Contrast   Narrative Clinical Data: Back pain radiating down the left leg. Assess for possible fracture.   MRI LUMBAR SPINE WITHOUT AND WITH CONTRAST:  Technique: Multiplanar and multiecho pulse sequences of the lumbar spine, to include the lower thoracic region and upper sacral regions, were obtained according to standard protocol before and after administration of intravenous contrast.  Contrast: 15 cc Magnevist.  Findings: The scan covers the region from S2 to T8.   At T8-9, there is discogenic edema within the anterior vertebral body marrow. No evidence  of a fracture. No disc protrusion.   No abnormality is seen below that as far down as L4-5. The patient does have some facet degeneration at the L4-5 level but there is no encroachment upon the canal or foramina.   At L5-S1, there is bilateral facet arthropathy with anterolisthesis of 6 mm. There is bulging of the disc. No compressive stenosis is evident.   IMPRESSION:  1. No evidence of compression fracture.   2. Degenerative disc disease at T8-9 with discogenic edema  within the anterior aspect of the T8 and T9 vertebral bodies.   3. Facet arthropathy at L4-5 without slippage or encroachment upon the neural spaces. The facet arthropathy could be symptomatic in and of itself.   4. L5-S1: Facet arthropathy with 6 mm of anterolisthesis. No apparent compressive stenosis.    Provider: Gwendel Hanson    Results for orders placed in visit on 11/07/98  DG Lumbar Spine Complete   Narrative FINDINGS CLINICAL DATA:  LOW BACK PAIN AND LEFT RADICULAR SYMPTOMS FIVE VIEWS LUMBAR SPINE (SUPPLEMENTED WITH AP VIEW THORACIC SPINE): THE PATIENT HAS TWELVE RIBS WITH RUDIMENTARY RIBS AT T12.  THERE ARE FOUR NON-RIB-BEARING LUMBAR- TYPE VERTEBRA WITH TRANSITIONAL ANATOMY AT L5.  GRADE I ANTEROLISTHESIS L4-L5 IS DEMONSTRATED. FACET OSTEOARTHRITIC CHANGES OF L4-5 LEVEL DEMONSTRATED. IMPRESSION      Complexity Note: Imaging results reviewed. Results shared with Ms. Rimmer, using Layman's terms.                         ROS  Cardiovascular History: Abnormal heart rhythm, High blood pressure and Blood thinners:  Antiplatelet Pulmonary or Respiratory History: Lung problems, Shortness of breath, Snoring  and Coughing up mucus (Bronchitis) Neurological History: No reported neurological signs or symptoms such as seizures, abnormal skin sensations, urinary and/or fecal incontinence, being born with an abnormal open spine and/or a tethered spinal cord Review of Past Neurological Studies:  Results for orders placed or performed during the hospital encounter of 12/25/16  CT Head Wo Contrast   Narrative   CLINICAL DATA:  Dizziness for approximately 2 weeks  EXAM: CT HEAD WITHOUT CONTRAST  TECHNIQUE: Contiguous axial images were obtained from the base of the skull through the vertex without intravenous contrast.  COMPARISON:  Head CT December 21, 2011 and brain MRI December 29, 2011  FINDINGS: Brain: The ventricles are normal in size and configuration. There is no  intracranial mass, hemorrhage, extra-axial fluid collection, or midline shift. Gray-white compartments appear normal. No acute infarct evident.  Vascular: There is no hyperdense vessel. There is no appreciable vascular calcification.  Skull: The bony calvarium appears intact.  Sinuses/Orbits: There is mild mucosal thickening in several ethmoid air cells. There is patchy opacity in each sphenoid sinus. Other visualized paranasal sinuses are clear. Visualized orbits appear symmetric bilaterally.  Other: Mastoid air cells are clear. Note that mastoids are somewhat hypoplastic on the right.  IMPRESSION: Areas of paranasal sinus disease.  Study otherwise unremarkable.   Electronically Signed   By: Lowella Grip III M.D.   On: 12/25/2016 14:00   Results for orders placed or performed during the hospital encounter of 12/28/11  MR Brain W Wo Contrast   Narrative   *RADIOLOGY REPORT*  Clinical Data: Episodes of [place] confusion.  MRI HEAD WITHOUT AND WITH CONTRAST  Technique:  Multiplanar, multiecho pulse sequences of the brain and surrounding structures were obtained according to standard protocol without and with intravenous contrast  Contrast: 52m MULTIHANCE GADOBENATE DIMEGLUMINE 529 MG/ML  IV SOLN  Comparison: Head CT 12/21/2011  Findings: Diffusion imaging does not show any acute or subacute infarction.  The brainstem and cerebellum are normal.  The cerebral hemispheres are normal except for a few scattered foci of T2 and FLAIR signal within the hemispheric white matter, often seen in healthy individuals of this age.  The differential diagnosis is early and minimal manifestation of small vessel disease versus demyelinating disease versus migraine related foci versus old head trauma.  No cortical or large vessel territory abnormality.  No mass lesion, hemorrhage, hydrocephalus or extra-axial collection. No pituitary mass.  There is some inflammation affecting  the maxillary sinuses.  After contrast administration, no abnormal enhancement occurs.  IMPRESSION: No acute or reversible findings.  Few hemispheric white matter foci not likely of clinical relevance.  See above discussion.  Some inflammation of the maxillary sinuses.   Original Report Authenticated By: Nelson Chimes, M.D.   MR MRA HEAD WO CONTRAST   Narrative   *RADIOLOGY REPORT*  Clinical Data: Episodes of transient  confusion for 1 week.  MRA HEAD WITHOUT CONTRAST  Technique: Angiographic images of the Circle of Willis were obtained using MRA technique without intravenous contrast.  Comparison: MRI 12/28/2011.  Findings:  The internal carotid arteries are widely patent.  The basilar artery is widely patent with vertebrals codominant.  There is no intracranial stenosis or aneurysm.  IMPRESSION: Negative exam.   Original Report Authenticated By: Rolla Flatten, M.D.   Results for orders placed or performed in visit on 04/22/11  DG Skull Complete   Narrative   *RADIOLOGY REPORT*  Clinical Data: Steel pipe head injury yesterday  SKULL - COMPLETE 4 + VIEW  Comparison: Preliminary reading of Dr. Carlota Raspberry  Findings: Three views of the skull submitted.  No skull fracture is identified.  No radiopaque foreign body.  IMPRESSION: No skull fracture is identified.  Original Report Authenticated By: Lahoma Crocker, M.D.   Psychological-Psychiatric History: Anxiousness, Depressed and Difficulty sleeping and or falling asleep Gastrointestinal History: Vomiting blood (Ulcers), Heartburn due to stomach pushing into lungs (Hiatal hernia), Reflux or heatburn, Inflamed pancreas (Pancreatitis) and Irregular, infrequent bowel movements (Constipation) Genitourinary History: No reported renal or genitourinary signs or symptoms such as difficulty voiding or producing urine, peeing blood, non-functioning kidney, kidney stones, difficulty emptying the bladder, difficulty controlling the flow of  urine, or chronic kidney disease Hematological History: No reported hematological signs or symptoms such as prolonged bleeding, low or poor functioning platelets, bruising or bleeding easily, hereditary bleeding problems, low energy levels due to low hemoglobin or being anemic Endocrine History: No reported endocrine signs or symptoms such as high or low blood sugar, rapid heart rate due to high thyroid levels, obesity or weight gain due to slow thyroid or thyroid disease Rheumatologic History: No reported rheumatological signs and symptoms such as fatigue, joint pain, tenderness, swelling, redness, heat, stiffness, decreased range of motion, with or without associated rash Musculoskeletal History: Negative for myasthenia gravis, muscular dystrophy, multiple sclerosis or malignant hyperthermia Work History: Retired  Allergies  Ms. Goerke has No Known Allergies.  Laboratory Chemistry  Inflammation Markers (CRP: Acute Phase) (ESR: Chronic Phase) Lab Results  Component Value Date   ESRSEDRATE 10 08/01/2007                 Rheumatology Markers No results found for: RF, ANA, LABURIC, URICUR, LYMEIGGIGMAB, LYMEABIGMQN              Renal Function Markers Lab Results  Component Value Date   BUN  11 12/25/2016   CREATININE 0.66 12/25/2016   GFRAA >60 12/25/2016   GFRNONAA >60 12/25/2016                 Hepatic Function Markers Lab Results  Component Value Date   AST 24 09/22/2015   ALT 15 09/22/2015   ALBUMIN 4.0 09/22/2015   ALKPHOS 88 09/22/2015   HCVAB NEGATIVE 03/15/2014   LIPASE 22 09/22/2015                 Electrolytes Lab Results  Component Value Date   NA 139 12/25/2016   K 3.6 12/25/2016   CL 108 12/25/2016   CALCIUM 9.1 12/25/2016                 Neuropathy Markers Lab Results  Component Value Date   VITAMINB12 541 03/10/2012   FOLATE 11.6 08/01/2007   HGBA1C 5.6 12/22/2011                 Bone Pathology Markers No results found for: VD25OH, DU202RK2HCW,  CB7628BT5, VV6160VP7, 25OHVITD1, 25OHVITD2, 25OHVITD3, TESTOFREE, TESTOSTERONE               Coagulation Parameters Lab Results  Component Value Date   INR 0.9 ratio 01/14/2010   LABPROT 9.5 (L) 01/14/2010   APTT 27.2 01/14/2010   PLT 219 12/25/2016   DDIMER  05/14/2009    <0.22        AT THE INHOUSE ESTABLISHED CUTOFF VALUE OF 0.48 ug/mL FEU, THIS ASSAY HAS BEEN DOCUMENTED IN THE LITERATURE TO HAVE A SENSITIVITY AND NEGATIVE PREDICTIVE VALUE OF AT LEAST 98 TO 99%.  THE TEST RESULT SHOULD BE CORRELATED WITH AN ASSESSMENT OF THE CLINICAL PROBABILITY OF DVT / VTE.                 Cardiovascular Markers Lab Results  Component Value Date   BNP 15.4 04/06/2015   TROPONINI <0.30 12/22/2011   HGB 15.7 12/25/2016   HCT 46.2 12/25/2016                 CA Markers No results found for: CEA, CA125, LABCA2               Note: Lab results reviewed.  [place]  Drug: Ms. Layson  reports that she does not use drugs. Alcohol:  reports that she does not drink alcohol. Tobacco:  reports that  has never smoked. she has never used smokeless tobacco. Medical:  has a past medical history of Anxiety, Arthritis, Asthma, First degree heart block, Frequency of urination, Hemorrhoids, Hiatal hernia, History of acute pancreatitis (03/16/2015), History of adenomatous polyp of colon, History of concussion (2012), History of transient ischemic attack (TIA) (12/22/2011), History of vaginal dysplasia (2016), Hypertension, Pseudocyst of pancreas, PSVT (paroxysmal supraventricular tachycardia) ([place]), Rectal bleeding, S/P AV (atrioventricular) nodal ablation (01-21-2010    dr Caryl Comes), Simple renal cyst, and Urgency of urination. Family: family history includes Breast cancer in her cousin and maternal aunt; Breast cancer (age of onset: 65) in her mother; COPD in her father; Cancer in her brother, brother, and mother; Cancer (age of onset: 71) in her brother; Dementia in her mother; Heart disease in her brother; Heart  disease (age of onset: 79) in her mother; Heart disease (age of onset: 65) in her father; Hypertension in her mother; Mental illness in her brother.  Past Surgical History:  Procedure Laterality Date  . BREAST EXCISIONAL BIOPSY Right   . CARDIAC ELECTROPHYSIOLOGY MAPPING AND ABLATION  01-21-2010  dr Caryl Comes   ablation AVNRT  . FOOT NEUROMA SURGERY Left 1996  . HEMORRHOID SURGERY N/A 10/28/2016   Procedure: HEMORRHOIDECTOMY AND HEMORRHOIDAL PEXY;  Surgeon: Leighton Ruff, MD;  Location: [facility];  Service: General;  Laterality: N/A;  . LAPAROSCOPY TAKEDOWN AND REPAIR HIATAL HERNIA /  NISSEN FUNDOPLATION  09-01-2005    dr Hassell Done  [facility]  . NASAL SEPTUM SURGERY  1991  . TEE WITHOUT CARDIOVERSION  02/15/2012   Procedure: TRANSESOPHAGEAL ECHOCARDIOGRAM (TEE);  Surgeon: Laverda Page, MD;  Location: [facility];  Service: Cardiovascular;  Laterality: N/A;  normal LV, normal EF, mild MR, trace TR, trace PI  . TRANSTHORACIC ECHOCARDIOGRAM  12-22-2011    dr Einar Gip   normal echo  . VAGINAL HYSTERECTOMY  1996   w/  BSO   Active Ambulatory Problems    Diagnosis Date Noted  . HYPERTENSION 03/14/2007  . ESOPHAGEAL STRICTURE 07/31/2007  . G E R D 03/14/2007  . HIATAL HERNIA 07/31/2007  . RECTAL BLEEDING 08/01/2007  . DIARRHEA 08/01/2007  . COLONIC POLYPS, HX OF 07/31/2007  . SVT/ PSVT/ PAT 01/13/2010  . TIA (transient ischemic attack) 12/22/2011  . VAIN I (vaginal intraepithelial neoplasia grade I) 04/17/2014  . Pancreatitis 03/16/2015  . Pancreatitis, acute   . Epigastric abdominal pain   . Acute pancreatitis   . Cough    Resolved Ambulatory Problems    Diagnosis Date Noted  . HYPERLIPIDEMIA 03/16/2007  . Cough 03/16/2007   Past Medical History:  Diagnosis Date  . Anxiety   . Arthritis   . Asthma   . First degree heart block   . Frequency of urination   . Hemorrhoids   . Hiatal hernia   . History of acute pancreatitis 03/16/2015  . History of adenomatous polyp  of colon   . History of concussion 2012  . History of transient ischemic attack (TIA) 12/22/2011  . History of vaginal dysplasia 2016  . Hypertension   . Pseudocyst of pancreas   . PSVT (paroxysmal supraventricular tachycardia) ([place])   . Rectal bleeding   . S/P AV (atrioventricular) nodal ablation 01-21-2010    dr Caryl Comes  . Simple renal cyst   . Urgency of urination    Constitutional Exam  General appearance: Well nourished, well developed, and well hydrated. In no apparent acute distress Vitals:   02/16/17 1018  BP: (!) 143/68  Pulse: 93  Resp: 16  Temp: 98.2 F (36.8 C)  TempSrc: Oral  SpO2: 98%  Weight: 167 lb (75.8 kg)  Height: _0  (1.549 m)   BMI Assessment: Estimated body mass index is 31.55 kg/m as calculated from the following:   Height as of this encounter: _1  (1.549 m).   Weight as of this encounter: 167 lb (75.8 kg).  BMI interpretation table: BMI level Category Range association with higher incidence of chronic pain  <18 kg/m2 Underweight   18.5-24.9 kg/m2 Ideal body weight   25-29.9 kg/m2 Overweight Increased incidence by 20%  30-34.9 kg/m2 Obese (Class I) Increased incidence by 68%  35-39.9 kg/m2 Severe obesity (Class II) Increased incidence by 136%  >40 kg/m2 Extreme obesity (Class III) Increased incidence by 254%   BMI Readings from Last 4 Encounters:  02/16/17 31.55 kg/m  12/25/16 31.55 kg/m  10/28/16 31.08 kg/m  09/14/16 31.78 kg/m   Wt Readings from Last 4 Encounters:  02/16/17 167 lb (75.8 kg)  12/25/16 167 lb (75.8 kg)  10/28/16 164 lb 8 oz (74.6 kg)  09/14/16 163 lb (73.9  kg)  Psych/Mental status: Alert, oriented x 3 (person, place, & time)       Eyes: PERLA Respiratory: No evidence of acute respiratory distress  Cervical Spine Area Exam  Skin & Axial Inspection: No masses, redness, edema, swelling, or associated skin lesions Alignment: Symmetrical Functional ROM: Decreased ROM, bilaterally limited cervical extension Stability:  No instability detected Muscle Tone/Strength: Functionally intact. No obvious neuro-muscular anomalies detected. Sensory (Neurological): Arthropathic arthralgia Palpation:  Positive provocative maneuver for for cervical facet disease  Upper Extremity (UE) Exam    Side: Right upper extremity  Side: Left upper extremity  Skin & Extremity Inspection: Skin color, temperature, and hair growth are WNL. No peripheral edema or cyanosis. No masses, redness, swelling, asymmetry, or associated skin lesions. No contractures.  Skin & Extremity Inspection: Skin color, temperature, and hair growth are WNL. No peripheral edema or cyanosis. No masses, redness, swelling, asymmetry, or associated skin lesions. No contractures.  Functional ROM: Unrestricted ROM          Functional ROM: Unrestricted ROM          Muscle Tone/Strength: Functionally intact. No obvious neuro-muscular anomalies detected.  Muscle Tone/Strength: Functionally intact. No obvious neuro-muscular anomalies detected.  Sensory (Neurological): Unimpaired          Sensory (Neurological): Unimpaired          Palpation: No palpable anomalies              Palpation: No palpable anomalies              Specialized Test(s): Deferred         Specialized Test(s): Deferred          Thoracic Spine Area Exam  Skin & Axial Inspection: No masses, redness, or swelling Alignment: Symmetrical Functional ROM: Unrestricted ROM Stability: No instability detected Muscle Tone/Strength: Functionally intact. No obvious neuro-muscular anomalies detected. Sensory (Neurological): Unimpaired Muscle strength & Tone: No palpable anomalies  Lumbar Spine Area Exam  Skin & Axial Inspection: No masses, redness, or swelling Alignment: Symmetrical Functional ROM: Unrestricted ROM      Stability: No instability detected Muscle Tone/Strength: Functionally intact. No obvious neuro-muscular anomalies detected. Sensory (Neurological): Unimpaired Palpation: Complains of area  being tender to palpation Bilateral Fist Percussion Test Provocative Tests: Lumbar Hyperextension and rotation test: Positive bilaterally for facet joint pain. Lumbar Lateral bending test: Positive due to pain. Patrick's Maneuver: evaluation deferred today                    Gait & Posture Assessment  Ambulation: Unassisted Gait: Relatively normal for age and body habitus Posture: WNL   Lower Extremity Exam    Side: Right lower extremity  Side: Left lower extremity  Skin & Extremity Inspection: Skin color, temperature, and hair growth are WNL. No peripheral edema or cyanosis. No masses, redness, swelling, asymmetry, or associated skin lesions. No contractures.  Skin & Extremity Inspection: Skin color, temperature, and hair growth are WNL. No peripheral edema or cyanosis. No masses, redness, swelling, asymmetry, or associated skin lesions. No contractures.  Functional ROM: Unrestricted ROM          Functional ROM: Unrestricted ROM          Muscle Tone/Strength: Functionally intact. No obvious neuro-muscular anomalies detected.  Muscle Tone/Strength: Functionally intact. No obvious neuro-muscular anomalies detected.  Sensory (Neurological): Unimpaired  Sensory (Neurological): Unimpaired  Palpation: No palpable anomalies  Palpation: No palpable anomalies   Assessment  Primary Diagnosis & Pertinent Problem List: The primary encounter diagnosis  was Lumbar spondylosis. Diagnoses of Lumbar degenerative disc disease, Lumbar facet arthropathy, Spondylosis of cervical region without myelopathy or radiculopathy, Cervicalgia, and Chronic pain syndrome were also pertinent to this visit.  Visit Diagnosis (New problems to examiner): 1. Lumbar spondylosis   2. Lumbar degenerative disc disease   3. Lumbar facet arthropathy   4. Spondylosis of cervical region without myelopathy or radiculopathy   5. Cervicalgia   6. Chronic pain syndrome     67 year old female who presents with a chief complaint of  axial low back pain that radiates into bilateral hips and occasionally past her knees along with neck pain that results and headaches that start from her posterior scalp and radiate up along the front portion of her skull with accompanied dizziness.  Patient has been dealing with axial low back pain for greater than 20 years.  She has had epidural steroid injections and is performed physical therapy in the past, greater than 20 years ago.  She states that the majority of her pain is in her low back and hip region and occasionally radiates into the back of her legs.  Patient's lumbar MRI shows severe facet arthropathy and degeneration most pronounced at L3/4, L4/5 and L5/S1.  Patient also has pain with facet loading and lateral rotation.  We discussed diagnostic lumbar facet blocks at L3/4, L4/5 and L5/S1 bilaterally.  In regards to her neck pain, we discussed cervical epidural steroid injection for cervical radiculopathy.  Risks and benefits of these procedures were discussed.  We will first start off with lumbar facet blocks at the above-mentioned levels.  This may be followed by radiofrequency ablation.  After addressing the patient's axial low back pain, we can focus on her neck pain from cervical radiculopathy and try cervical epidural steroid injection.  Patient is on aspirin 325 mg for history of TIAs.  I instructed the patient to stop this 5 days prior to her schedule lumbar facet blocks to take 81 mg of aspirin instead.  Patient endorsed understanding.  Plan: -Follow-up for bilateral L3/4, L4/5 and L5/S1 facet blocks with sedation for lumbar spondylosis -Future considerations: Cervical ESI, cervical facet medial branch nerve blocks.   No future appointments.  Primary Care Physician: Velna Hatchet, MD Location: [facility] Note by: Gillis Santa, M.D, Date: 02/16/2017; Time: 12:52 PM  Patient Instructions  Facet Joint Block The facet joints connect the bones of  the spine (vertebrae). They make it possible for you to bend, twist, and make other movements with your spine. They also keep you from bending too far, twisting too far, and making other excessive movements. A facet joint block is a procedure where a numbing medicine (anesthetic) is injected into a facet joint. Often, a type of anti-inflammatory medicine called a steroid is also injected. A facet joint block may be done to diagnose neck or back pain. If the pain gets better after a facet joint block, it means the pain is probably coming from the facet joint. If the pain does not get better, it means the pain is probably not coming from the facet joint. A facet joint block may also be done to relieve neck or back pain caused by an inflamed facet joint. A facet joint block is only done to relieve pain if the pain does not improve with other methods, such as medicine, exercise programs, and physical therapy. Tell a health care provider about:  Any allergies you have.  All medicines you are taking, including vitamins, herbs, eye drops, creams,  and over-the-counter medicines.  Any problems you or family members have had with anesthetic medicines.  Any blood disorders you have.  Any surgeries you have had.  Any medical conditions you have.  Whether you are pregnant or may be pregnant. What are the risks? Generally, this is a safe procedure. However, problems may occur, including:  Bleeding.  Injury to a nerve near the injection site.  Pain at the injection site.  Weakness or numbness in areas controlled by nerves near the injection site.  Infection.  Temporary fluid retention.  Allergic reactions to medicines or dyes.  Injury to other structures or organs near the injection site.  What happens before the procedure?  Follow instructions from your health care provider about eating or drinking restrictions.  Ask your health care provider about: ? Changing or stopping your regular  medicines. This is especially important if you are taking diabetes medicines or blood thinners. ? Taking medicines such as aspirin and ibuprofen. These medicines can thin your blood. Do not take these medicines before your procedure if your health care provider instructs you not to.  Do not take any new dietary supplements or medicines without asking your health care provider first.  Plan to have someone take you home after the procedure. What happens during the procedure?  You may need to remove your clothing and dress in an open-back gown.  The procedure will be done while you are lying on an X-ray table. You will most likely be asked to lie on your stomach, but you may be asked to lie in a different position if an injection will be made in your neck.  Machines will be used to monitor your oxygen levels, heart rate, and blood pressure.  If an injection will be made in your neck, an IV tube will be inserted into one of your veins. Fluids and medicine will flow directly into your body through the IV tube.  The area over the facet joint where the injection will be made will be cleaned with soap. The surrounding skin will be covered with clean drapes.  A numbing medicine (local anesthetic) will be applied to your skin. Your skin may sting or burn for a moment.  A video X-ray machine (fluoroscopy) will be used to locate the joint. In some cases, a CT scan may be used.  A contrast dye may be injected into the facet joint area to help locate the joint.  When the joint is located, an anesthetic will be injected into the joint through the needle.  Your health care provider will ask you whether you feel pain relief. If you do feel relief, a steroid may be injected to provide pain relief for a longer period of time. If you do not feel relief or feel only partial relief, additional injections of an anesthetic may be made in other facet joints.  The needle will be removed.  Your skin will be  cleaned.  A bandage (dressing) will be applied over each injection site. The procedure may vary among health care providers and hospitals. What happens after the procedure?  You will be observed for 15-30 minutes before being allowed to go home. This information is not intended to replace advice given to you by your health care provider. Make sure you discuss any questions you have with your health care provider. Document Released: 05/26/2006 Document Revised: 02/05/2015 Document Reviewed: 09/30/2014 Elsevier Interactive Patient Education  [employer].

## 2017-02-16 NOTE — Patient Instructions (Signed)
Facet Joint Block The facet joints connect the bones of the spine (vertebrae). They make it possible for you to bend, twist, and make other movements with your spine. They also keep you from bending too far, twisting too far, and making other excessive movements. A facet joint block is a procedure where a numbing medicine (anesthetic) is injected into a facet joint. Often, a type of anti-inflammatory medicine called a steroid is also injected. A facet joint block may be done to diagnose neck or back pain. If the pain gets better after a facet joint block, it means the pain is probably coming from the facet joint. If the pain does not get better, it means the pain is probably not coming from the facet joint. A facet joint block may also be done to relieve neck or back pain caused by an inflamed facet joint. A facet joint block is only done to relieve pain if the pain does not improve with other methods, such as medicine, exercise programs, and physical therapy. Tell a health care provider about:  Any allergies you have.  All medicines you are taking, including vitamins, herbs, eye drops, creams, and over-the-counter medicines.  Any problems you or family members have had with anesthetic medicines.  Any blood disorders you have.  Any surgeries you have had.  Any medical conditions you have.  Whether you are pregnant or may be pregnant. What are the risks? Generally, this is a safe procedure. However, problems may occur, including:  Bleeding.  Injury to a nerve near the injection site.  Pain at the injection site.  Weakness or numbness in areas controlled by nerves near the injection site.  Infection.  Temporary fluid retention.  Allergic reactions to medicines or dyes.  Injury to other structures or organs near the injection site.  What happens before the procedure?  Follow instructions from your health care provider about eating or drinking restrictions.  Ask your health care  provider about: ? Changing or stopping your regular medicines. This is especially important if you are taking diabetes medicines or blood thinners. ? Taking medicines such as aspirin and ibuprofen. These medicines can thin your blood. Do not take these medicines before your procedure if your health care provider instructs you not to.  Do not take any new dietary supplements or medicines without asking your health care provider first.  Plan to have someone take you home after the procedure. What happens during the procedure?  You may need to remove your clothing and dress in an open-back gown.  The procedure will be done while you are lying on an X-ray table. You will most likely be asked to lie on your stomach, but you may be asked to lie in a different position if an injection will be made in your neck.  Machines will be used to monitor your oxygen levels, heart rate, and blood pressure.  If an injection will be made in your neck, an IV tube will be inserted into one of your veins. Fluids and medicine will flow directly into your body through the IV tube.  The area over the facet joint where the injection will be made will be cleaned with soap. The surrounding skin will be covered with clean drapes.  A numbing medicine (local anesthetic) will be applied to your skin. Your skin may sting or burn for a moment.  A video X-ray machine (fluoroscopy) will be used to locate the joint. In some cases, a CT scan may be used.  A  contrast dye may be injected into the facet joint area to help locate the joint.  When the joint is located, an anesthetic will be injected into the joint through the needle.  Your health care provider will ask you whether you feel pain relief. If you do feel relief, a steroid may be injected to provide pain relief for a longer period of time. If you do not feel relief or feel only partial relief, additional injections of an anesthetic may be made in other facet  joints.  The needle will be removed.  Your skin will be cleaned.  A bandage (dressing) will be applied over each injection site. The procedure may vary among health care providers and hospitals. What happens after the procedure?  You will be observed for 15-30 minutes before being allowed to go home. This information is not intended to replace advice given to you by your health care provider. Make sure you discuss any questions you have with your health care provider. Document Released: 05/26/2006 Document Revised: 02/05/2015 Document Reviewed: 09/30/2014 Elsevier Interactive Patient Education  2018 [employer]  

## 2017-03-02 ENCOUNTER — Ambulatory Visit
Admission: RE | Admit: 2017-03-02 | Discharge: 2017-03-02 | Disposition: A | Discharge status: Home / Self Care | Payer: Medicare HMO | Source: Ambulatory Visit | Attending: Student in an Organized Health Care Education/Training Program | Admitting: Student in an Organized Health Care Education/Training Program

## 2017-03-02 ENCOUNTER — Encounter: Payer: Self-pay | Admitting: Student in an Organized Health Care Education/Training Program

## 2017-03-02 ENCOUNTER — Ambulatory Visit (HOSPITAL_BASED_OUTPATIENT_CLINIC_OR_DEPARTMENT_OTHER): Payer: Medicare HMO | Admitting: Student in an Organized Health Care Education/Training Program

## 2017-03-02 ENCOUNTER — Other Ambulatory Visit: Payer: Self-pay

## 2017-03-02 DIAGNOSIS — M47816 Spondylosis without myelopathy or radiculopathy, lumbar region: Secondary | ICD-10-CM | POA: Diagnosis not present

## 2017-03-02 DIAGNOSIS — Z79899 Other long term (current) drug therapy: Secondary | ICD-10-CM | POA: Insufficient documentation

## 2017-03-02 DIAGNOSIS — M545 Low back pain: Secondary | ICD-10-CM | POA: Diagnosis present

## 2017-03-02 DIAGNOSIS — Z9889 Other specified postprocedural states: Secondary | ICD-10-CM | POA: Diagnosis not present

## 2017-03-02 DIAGNOSIS — Z90710 Acquired absence of both cervix and uterus: Secondary | ICD-10-CM | POA: Insufficient documentation

## 2017-03-02 MED ORDER — ROPIVACAINE HCL 2 MG/ML IJ SOLN
10.0000 mL | Freq: Once | INTRAMUSCULAR | Status: AC
  Administered 2017-03-02: 10 mL
  Filled 2017-03-02: qty 10

## 2017-03-02 MED ORDER — LIDOCAINE HCL (PF) 1 % IJ SOLN
10.0000 mL | Freq: Once | INTRAMUSCULAR | Status: AC
  Administered 2017-03-02: 5 mL
  Filled 2017-03-02: qty 10

## 2017-03-02 MED ORDER — FENTANYL CITRATE (PF) 100 MCG/2ML IJ SOLN
25.0000 ug | INTRAMUSCULAR | Status: DC | PRN
  Administered 2017-03-02: 75 ug via INTRAVENOUS
  Filled 2017-03-02: qty 2

## 2017-03-02 MED ORDER — DEXAMETHASONE SODIUM PHOSPHATE 10 MG/ML IJ SOLN
10.0000 mg | Freq: Once | INTRAMUSCULAR | Status: AC
  Administered 2017-03-02: 10 mg
  Filled 2017-03-02: qty 1

## 2017-03-02 NOTE — Patient Instructions (Signed)
Facet Joint Block The facet joints connect the bones of the spine (vertebrae). They make it possible for you to bend, twist, and make other movements with your spine. They also keep you from bending too far, twisting too far, and making other excessive movements. A facet joint block is a procedure where a numbing medicine (anesthetic) is injected into a facet joint. Often, a type of anti-inflammatory medicine called a steroid is also injected. A facet joint block may be done to diagnose neck or back pain. If the pain gets better after a facet joint block, it means the pain is probably coming from the facet joint. If the pain does not get better, it means the pain is probably not coming from the facet joint. A facet joint block may also be done to relieve neck or back pain caused by an inflamed facet joint. A facet joint block is only done to relieve pain if the pain does not improve with other methods, such as medicine, exercise programs, and physical therapy. Tell a health care provider about:  Any allergies you have.  All medicines you are taking, including vitamins, herbs, eye drops, creams, and over-the-counter medicines.  Any problems you or family members have had with anesthetic medicines.  Any blood disorders you have.  Any surgeries you have had.  Any medical conditions you have.  Whether you are pregnant or may be pregnant. What are the risks? Generally, this is a safe procedure. However, problems may occur, including:  Bleeding.  Injury to a nerve near the injection site.  Pain at the injection site.  Weakness or numbness in areas controlled by nerves near the injection site.  Infection.  Temporary fluid retention.  Allergic reactions to medicines or dyes.  Injury to other structures or organs near the injection site.  What happens before the procedure?  Follow instructions from your health care provider about eating or drinking restrictions.  Ask your health care  provider about: ? Changing or stopping your regular medicines. This is especially important if you are taking diabetes medicines or blood thinners. ? Taking medicines such as aspirin and ibuprofen. These medicines can thin your blood. Do not take these medicines before your procedure if your health care provider instructs you not to.  Do not take any new dietary supplements or medicines without asking your health care provider first.  Plan to have someone take you home after the procedure. What happens during the procedure?  You may need to remove your clothing and dress in an open-back gown.  The procedure will be done while you are lying on an X-ray table. You will most likely be asked to lie on your stomach, but you may be asked to lie in a different position if an injection will be made in your neck.  Machines will be used to monitor your oxygen levels, heart rate, and blood pressure.  If an injection will be made in your neck, an IV tube will be inserted into one of your veins. Fluids and medicine will flow directly into your body through the IV tube.  The area over the facet joint where the injection will be made will be cleaned with soap. The surrounding skin will be covered with clean drapes.  A numbing medicine (local anesthetic) will be applied to your skin. Your skin may sting or burn for a moment.  A video X-ray machine (fluoroscopy) will be used to locate the joint. In some cases, a CT scan may be used.  A  contrast dye may be injected into the facet joint area to help locate the joint.  When the joint is located, an anesthetic will be injected into the joint through the needle.  Your health care provider will ask you whether you feel pain relief. If you do feel relief, a steroid may be injected to provide pain relief for a longer period of time. If you do not feel relief or feel only partial relief, additional injections of an anesthetic may be made in other facet  joints.  The needle will be removed.  Your skin will be cleaned.  A bandage (dressing) will be applied over each injection site. The procedure may vary among health care providers and hospitals. What happens after the procedure?  You will be observed for 15-30 minutes before being allowed to go home. This information is not intended to replace advice given to you by your health care provider. Make sure you discuss any questions you have with your health care provider. Document Released: 05/26/2006 Document Revised: 02/05/2015 Document Reviewed: 09/30/2014 Elsevier Interactive Patient Education  2018 [place]. Pain Management Discharge Instructions  General Discharge Instructions :  If you need to reach your doctor call: Monday-Friday 8:00 am - 4:00 pm at [phone] or toll free [phone].  After clinic hours [phone] to have operator reach doctor.  Bring all of your medication bottles to all your appointments in the pain clinic.  To cancel or reschedule your appointment with Pain Management please remember to call 24 hours in advance to avoid a fee.  Refer to the educational materials which you have been given on: General Risks, I had my Procedure. Discharge Instructions, Post Sedation.  Post Procedure Instructions:  The drugs you were given will stay in your system until tomorrow, so for the next 24 hours you should not drive, make any legal decisions or drink any alcoholic beverages.  You may eat anything you prefer, but it is better to start with liquids then soups and crackers, and gradually work up to solid foods.  Please notify your doctor immediately if you have any unusual bleeding, trouble breathing or pain that is not related to your normal pain.  Depending on the type of procedure that was done, some parts of your body may feel week and/or numb.  This usually clears up by tonight or the next day.  Walk with the use of an assistive device or accompanied  by an adult for the 24 hours.  You may use ice on the affected area for the first 24 hours.  Put ice in a Ziploc bag and cover with a towel and place against area 15 minutes on 15 minutes off.  You may switch to heat after 24 hours.

## 2017-03-02 NOTE — Progress Notes (Signed)
Safety precautions to be maintained throughout the outpatient stay will include: orient to surroundings, keep bed in low position, maintain call bell within reach at all times, provide assistance with transfer out of bed and ambulation.  

## 2017-03-02 NOTE — Progress Notes (Signed)
Patient's Name: Pamela Daniel  MRN: [id number]  Referring Provider: Gillis Santa, MD  DOB: 1950-05-17  PCP: Velna Hatchet, MD  DOS: 03/02/2017  Note by: Gillis Santa, MD  Service setting: Ambulatory outpatient  Specialty: Interventional Pain Management  Patient type: Established  Location: [facility] (AMB) Pain Management Facility  Visit type: Interventional Procedure   Primary Reason for Visit: Interventional Pain Management Treatment. CC: Back Pain (low)  Procedure #1:  Anesthesia, Analgesia, Anxiolysis:  Type: Medial Branch Facet Block  #1  Primary Purpose: Diagnostic Region: Lumbar Level:  L3, L4, L5,  Medial Branch Level(s) Target Area: For Lumbar Facet blocks, the target is the groove formed by the junction of the transverse process and superior articular process. For the L5 dorsal ramus, the target is the notch between superior articular process and sacral ala.  Approach: Posterior, paramedial, percutaneous approach. Laterality: Bilateral Position: Prone  Type: Local Anesthesia with Moderate (Conscious) Sedation Local Anesthetic: Lidocaine 1% Route: Intravenous (IV) IV Access: Secured Sedation: Meaningful verbal contact was maintained at all times during the procedure  Indication(s): Analgesia and Anxiety   Indications: 1. Lumbar spondylosis    Pain Score: Pre-procedure: 3 /10 Post-procedure: 0-No pain/10  Pre-op Assessment:  Pamela Daniel is a 67 y.o. (year old), female patient, seen today for interventional treatment. She  has a past surgical history that includes Cardiac electrophysiology mapping and ablation (01-21-2010   dr Caryl Comes); Foot neuroma surgery (Left, 1996); Nasal septum surgery (1991); TEE without cardioversion (02/15/2012); Vaginal hysterectomy (1996); LAPAROSCOPY TAKEDOWN AND REPAIR HIATAL HERNIA /  NISSEN FUNDOPLATION (09-01-2005    dr Hassell Done  [facility]); transthoracic echocardiogram (12-22-2011    dr Einar Gip); Hemorrhoid surgery (N/A, 10/28/2016); and Breast excisional  biopsy (Right). Pamela Daniel has a current medication list which includes the following prescription(s): acetaminophen, albuterol, alprazolam, aspirin, budesonide-formoterol, cyclobenzaprine, albuterol, oxycodone, and triamcinolone cream, and the following Facility-Administered Medications: fentanyl. Her primarily concern today is the Back Pain (low)  Initial Vital Signs:  Pulse Rate: 75 Temp: 98.1 F (36.7 C) Resp: 18 BP: 131/88 SpO2: 97 %  BMI: Estimated body mass index is 33.25 kg/m as calculated from the following:   Height as of this encounter: 5\' 1"  (1.549 m).   Weight as of this encounter: 176 lb (79.8 kg).  Risk Assessment: Allergies: Reviewed. She has No Known Allergies.  Allergy Precautions: None required Coagulopathies: Reviewed. None identified.  Blood-thinner therapy: None at this time Active Infection(s): Reviewed. None identified. Pamela Daniel is afebrile  Site Confirmation: Pamela Daniel was asked to confirm the procedure and laterality before marking the site Procedure checklist: Completed Consent: Before the procedure and under the influence of no sedative(s), amnesic(s), or anxiolytics, the patient was informed of the treatment options, risks and possible complications. To fulfill our ethical and legal obligations, as recommended by the American Medical Association's Code of Ethics, I have informed the patient of my clinical impression; the nature and purpose of the treatment or procedure; the risks, benefits, and possible complications of the intervention; the alternatives, including doing nothing; the risk(s) and benefit(s) of the alternative treatment(s) or procedure(s); and the risk(s) and benefit(s) of doing nothing. The patient was provided information about the general risks and possible complications associated with the procedure. These may include, but are not limited to: failure to achieve desired goals, infection, bleeding, organ or nerve damage, allergic reactions,  paralysis, and death. In addition, the patient was informed of those risks and complications associated to Spine-related procedures, such as failure to decrease pain; infection (i.e.: Meningitis,  epidural or intraspinal abscess); bleeding (i.e.: epidural hematoma, subarachnoid hemorrhage, or any other type of intraspinal or peri-dural bleeding); organ or nerve damage (i.e.: Any type of peripheral nerve, nerve root, or spinal cord injury) with subsequent damage to sensory, motor, and/or autonomic systems, resulting in permanent pain, numbness, and/or weakness of one or several areas of the body; allergic reactions; (i.e.: anaphylactic reaction); and/or death. Furthermore, the patient was informed of those risks and complications associated with the medications. These include, but are not limited to: allergic reactions (i.e.: anaphylactic or anaphylactoid reaction(s)); adrenal axis suppression; blood sugar elevation that in diabetics may result in ketoacidosis or comma; water retention that in patients with history of congestive heart failure may result in shortness of breath, pulmonary edema, and decompensation with resultant heart failure; weight gain; swelling or edema; medication-induced neural toxicity; particulate matter embolism and blood vessel occlusion with resultant organ, and/or nervous system infarction; and/or aseptic necrosis of one or more joints. Finally, the patient was informed that Medicine is not an exact science; therefore, there is also the possibility of unforeseen or unpredictable risks and/or possible complications that may result in a catastrophic outcome. The patient indicated having understood very clearly. We have given the patient no guarantees and we have made no promises. Enough time was given to the patient to ask questions, all of which were answered to the patient's satisfaction. Pamela Daniel has indicated that she wanted to continue with the procedure. Attestation: I, the  ordering provider, attest that I have discussed with the patient the benefits, risks, side-effects, alternatives, likelihood of achieving goals, and potential problems during recovery for the procedure that I have provided informed consent. Date: 03/02/2017  Time: N/A  Pre-Procedure Preparation:  Monitoring: As per clinic protocol. Respiration, ETCO2, SpO2, BP, heart rate and rhythm monitor placed and checked for adequate function Safety Precautions: Patient was assessed for positional comfort and pressure points before starting the procedure. Time-out: I initiated and conducted the "Time-out" before starting the procedure, as per protocol. The patient was asked to participate by confirming the accuracy of the "Time Out" information. Verification of the correct person, site, and procedure were performed and confirmed by me, the nursing staff, and the patient. "Time-out" conducted as per Joint Commission's Universal Protocol (UP.01.01.01). "Time-out" Date & Time: 03/02/2017; 0917 hrs.  Description of Procedure #1:   Area Prepped: Entire Posterior Lumbosacral Region Prepping solution: ChloraPrep (2% chlorhexidine gluconate and 70% isopropyl alcohol) Safety Precautions: Aspiration looking for blood return was conducted prior to all injections. At no point did we inject any substances, as a needle was being advanced. No attempts were made at seeking any paresthesias. Safe injection practices and needle disposal techniques used. Medications properly checked for expiration dates. SDV (single dose vial) medications used. Description of the Procedure: Bilateral L3, L4, L5,  Medial Branch Level(s). Protocol guidelines were followed. The patient was placed in position over the fluoroscopy table. The target area was identified and the area prepped in the usual manner. Skin desensitized using vapocoolant spray. Skin & deeper tissues infiltrated with local anesthetic. Appropriate amount of time allowed to pass for  local anesthetics to take effect. The procedure needle was introduced through the skin, ipsilateral to the reported pain, and advanced to the target area. Employing the "Medial Branch Technique", the needles were advanced to the angle made by the superior and medial portion of the transverse process, and the lateral and inferior portion of the superior articulating process of the targeted vertebral bodies. This area is known  as "Burton's Eye" or the "Eye of the [place] Dog". A procedure needle was introduced through the skin, and this time advanced to the angle made by the superior and medial border of the sacral ala. The target area for the L4 medial branch is at the junction of the L5 transverse process and the L5 superior articulating process.  The target area for the L3 medial branches at the junction of the L4 transverse process and L4 superior articular process.  Target area for the L5 medial branch is at the junction of the S1 superior articular process and the sacral ala.   Negative aspiration confirmed. Solution injected in intermittent fashion, asking for systemic symptoms every 0.5cc of injectate. The needles were then removed and the area cleansed, making sure to leave some of the prepping solution back to take advantage of its long term bactericidal properties.   Illustration of the posterior view of the lumbar spine and the posterior neural structures. Laminae of L2 through S1 are labeled. DPRL5, dorsal primary ramus of L5; DPRS1, dorsal primary ramus of S1; DPR3, dorsal primary ramus of L3; FJ, facet (zygapophyseal) joint L3-L4; I, inferior articular process of L4; LB1, lateral branch of dorsal primary ramus of L1; IAB, inferior articular branches from L3 medial branch (supplies L4-L5 facet joint); IBP, intermediate branch plexus; MB3, medial branch of dorsal primary ramus of L3; NR3, third lumbar nerve root; S, superior articular process of L5; SAB, superior articular branches from L4 (supplies  L4-5 facet joint also); TP3, transverse process of L3.  Vitals:   03/02/17 0934 03/02/17 0937 03/02/17 0947 03/02/17 0957  BP: (!) 149/76 (!) 149/76 125/71 127/82  Pulse:      Resp: 11 11 14 14   Temp:      TempSrc:      SpO2: 98% 99% 96% 97%  Weight:      Height:        Start Time: 0922 hrs. End Time: 0936 hrs. Materials:  Needle(s) Type: Regular needle Gauge: 22G Length: 3.5-in Medication(s): Please see orders for medications and dosing details. 10 cc solution made of 9 cc of 0.2% ropivacaine, 1 cc of Decadron 10 mg/cc.  1-1.5 cc injected at each level bilaterally.  6 areas injected. Imaging Guidance (Spinal):  Type of Imaging Technique: Fluoroscopy Guidance (Spinal) Indication(s): Assistance in needle guidance and placement for procedures requiring needle placement in or near specific anatomical locations not easily accessible without such assistance. Exposure Time: Please see nurses notes. Contrast: None used. Fluoroscopic Guidance: I was personally present during the use of fluoroscopy. "Tunnel Vision Technique" used to obtain the best possible view of the target area. Parallax error corrected before commencing the procedure. "Direction-depth-direction" technique used to introduce the needle under continuous pulsed fluoroscopy. Once target was reached, antero-posterior, oblique, and lateral fluoroscopic projection used confirm needle placement in all planes. Images permanently stored in EMR. Interpretation: No contrast injected. I personally interpreted the imaging intraoperatively. Adequate needle placement confirmed in multiple planes. Permanent images saved into the patient's record.  Antibiotic Prophylaxis:   Anti-infectives (From admission, onward)   None     Indication(s): None identified  Post-operative Assessment:  Post-procedure Vital Signs:  Pulse Rate: 75 Temp: 98.1 F (36.7 C) Resp: 14 BP: 127/82 SpO2: 97 %  EBL: None  Complications: No immediate  post-treatment complications observed by team, or reported by patient.  Note: The patient tolerated the entire procedure well. A repeat set of vitals were taken after the procedure and the patient was kept under  observation following institutional policy, for this type of procedure. Post-procedural neurological assessment was performed, showing return to baseline, prior to discharge. The patient was provided with post-procedure discharge instructions, including a section on how to identify potential problems. Should any problems arise concerning this procedure, the patient was given instructions to immediately contact us, at any time, without hesitation. In any case, we plan to contact the patient by telephone for a follow-up status report regarding this interventional procedure.  Comments:  No additional relevant information. 5 out of 5 strength bilateral lower extremity: Plantar flexion, dorsiflexion, knee flexion, knee extension.  Plan of Care    Imaging Orders     DG C-Arm 1-60 Min-No Report Procedure Orders    No procedure(s) ordered today    Medications ordered for procedure: Meds ordered this encounter  Medications  . fentaNYL (SUBLIMAZE) injection 25-100 mcg    Make sure Narcan is available in the pyxis when using this medication. In the event of respiratory depression (RR< 8/min): Titrate NARCAN (naloxone) in increments of 0.1 to 0.2 mg IV at 2-3 minute intervals, until desired degree of reversal.  . lidocaine (PF) (XYLOCAINE) 1 % injection 10 mL  . ropivacaine (PF) 2 mg/mL (0.2%) (NAROPIN) injection 10 mL  . dexamethasone (DECADRON) injection 10 mg   Medications administered: We administered fentaNYL, lidocaine (PF), ropivacaine (PF) 2 mg/mL (0.2%), and dexamethasone.  See the medical record for exact dosing, route, and time of administration.  New Prescriptions   No medications on file   Disposition: Discharge home  Discharge Date & Time: 03/02/2017; 1003 hrs.    Physician-requested Follow-up: Return in about 3 weeks (around 03/23/2017) for Post Procedure Evaluation.  Future Appointments  Date Time Provider [place]  03/23/2017  9:00 AM Gillis Santa, MD [facility] None   Primary Care Physician: Velna Hatchet, MD Location: [facility] Note by: Gillis Santa, MD Date: 03/02/2017; Time: 1:25 PM  Disclaimer:  Medicine is not an exact science. The only guarantee in medicine is that nothing is guaranteed. It is important to note that the decision to proceed with this intervention was based on the information collected from the patient. The Data and conclusions were drawn from the patient's questionnaire, the interview, and the physical examination. Because the information was provided in large part by the patient, it cannot be guaranteed that it has not been purposely or unconsciously manipulated. Every effort has been made to obtain as much relevant data as possible for this evaluation. It is important to note that the conclusions that lead to this procedure are derived in large part from the available data. Always take into account that the treatment will also be dependent on availability of resources and existing treatment guidelines, considered by other Pain Management Practitioners as being common knowledge and practice, at the time of the intervention. For Medico-Legal purposes, it is also important to point out that variation in procedural techniques and pharmacological choices are the acceptable norm. The indications, contraindications, technique, and results of the above procedure should only be interpreted and judged by a Board-Certified Interventional Pain Specialist with extensive familiarity and expertise in the same exact procedure and technique.

## 2017-03-03 ENCOUNTER — Telehealth: Payer: Self-pay | Admitting: *Deleted

## 2017-03-03 NOTE — Telephone Encounter (Signed)
Voicemail left with patient to please return our call if there are questions or concerns re procedure on yesterday.

## 2017-03-23 ENCOUNTER — Other Ambulatory Visit: Payer: Self-pay

## 2017-03-23 ENCOUNTER — Ambulatory Visit
Payer: Medicare HMO | Attending: Student in an Organized Health Care Education/Training Program | Admitting: Student in an Organized Health Care Education/Training Program

## 2017-03-23 ENCOUNTER — Encounter: Payer: Self-pay | Admitting: Student in an Organized Health Care Education/Training Program

## 2017-03-23 VITALS — BP 131/71 | HR 72 | Temp 98.0°F | Ht 61.0 in | Wt 167.0 lb

## 2017-03-23 DIAGNOSIS — M47816 Spondylosis without myelopathy or radiculopathy, lumbar region: Secondary | ICD-10-CM | POA: Diagnosis not present

## 2017-03-23 DIAGNOSIS — M5136 Other intervertebral disc degeneration, lumbar region: Secondary | ICD-10-CM | POA: Insufficient documentation

## 2017-03-23 DIAGNOSIS — K222 Esophageal obstruction: Secondary | ICD-10-CM | POA: Diagnosis not present

## 2017-03-23 DIAGNOSIS — I10 Essential (primary) hypertension: Secondary | ICD-10-CM | POA: Insufficient documentation

## 2017-03-23 DIAGNOSIS — I471 Supraventricular tachycardia: Secondary | ICD-10-CM | POA: Insufficient documentation

## 2017-03-23 DIAGNOSIS — Z8673 Personal history of transient ischemic attack (TIA), and cerebral infarction without residual deficits: Secondary | ICD-10-CM | POA: Diagnosis not present

## 2017-03-23 DIAGNOSIS — Z7982 Long term (current) use of aspirin: Secondary | ICD-10-CM | POA: Insufficient documentation

## 2017-03-23 DIAGNOSIS — J45909 Unspecified asthma, uncomplicated: Secondary | ICD-10-CM | POA: Diagnosis not present

## 2017-03-23 DIAGNOSIS — G894 Chronic pain syndrome: Secondary | ICD-10-CM | POA: Diagnosis not present

## 2017-03-23 DIAGNOSIS — Z79899 Other long term (current) drug therapy: Secondary | ICD-10-CM | POA: Insufficient documentation

## 2017-03-23 DIAGNOSIS — F419 Anxiety disorder, unspecified: Secondary | ICD-10-CM | POA: Diagnosis not present

## 2017-03-23 NOTE — Progress Notes (Signed)
Patient's Name: Pamela Daniel  MRN: [id number]  Referring Provider: Velna Hatchet, MD  DOB: [birth year]  PCP: Velna Hatchet, MD  DOS: 03/23/2017  Note by: Gillis Santa, MD  Service setting: Ambulatory outpatient  Specialty: Interventional Pain Management  Location: [facility] (AMB) Pain Management Facility    Patient type: Established   Primary Reason(s) for Visit: Encounter for post-procedure evaluation of chronic illness with mild to moderate exacerbation CC: Back Pain (lower)  HPI  Pamela Daniel is a 67 y.o. year old, female patient, who comes today for a post-procedure evaluation. She has HYPERTENSION; ESOPHAGEAL STRICTURE; G E R D; HIATAL HERNIA; RECTAL BLEEDING; DIARRHEA; COLONIC POLYPS, HX OF; SVT/ PSVT/ PAT; TIA (transient ischemic attack); VAIN I (vaginal intraepithelial neoplasia grade I); Pancreatitis; Pancreatitis, acute; Epigastric abdominal pain; Acute pancreatitis; and Cough on their problem list. Her primarily concern today is the Back Pain (lower)  Pain Assessment: Location: Lower Back Radiating: Radiates down hip and both leg to thighs Onset: More than a month ago Duration: Chronic pain Quality: Aching, Pressure, Discomfort, Constant Severity: 3 /10 (self-reported pain score)  Note: Reported level is compatible with observation.                         When using our objective Pain Scale, levels between 6 and 10/10 are said to belong in an emergency room, as it progressively worsens from a 6/10, described as severely limiting, requiring emergency care not usually available at an outpatient pain management facility. At a 6/10 level, communication becomes difficult and requires great effort. Assistance to reach the emergency department may be required. Facial flushing and profuse sweating along with potentially dangerous increases in heart rate and blood pressure will be evident. Effect on ADL:   Timing: Constant Modifying factors: laying down and stretching, heating pad,  meds  Pamela Daniel comes in today for post-procedure evaluation after the treatment done on 03/02/2017.  Further details on both, my assessment(s), as well as the proposed treatment plan, please see below.  Post-Procedure Assessment  03/02/2017 Procedure: Bilateral L3, L4, L5 diagnostic facet medial branch nerve blocks Pre-procedure pain score:  3/10 Post-procedure pain score: 0/10         Influential Factors: BMI: 31.55 kg/m Intra-procedural challenges: None observed.         Assessment challenges: None detected.              Reported side-effects: None.        Post-procedural adverse reactions or complications: None reported         Sedation: Please see nurses note. When no sedatives are used, the analgesic levels obtained are directly associated to the effectiveness of the local anesthetics. However, when sedation is provided, the level of analgesia obtained during the initial 1 hour following the intervention, is believed to be the result of a combination of factors. These factors may include, but are not limited to: 1. The effectiveness of the local anesthetics used. 2. The effects of the analgesic(s) and/or anxiolytic(s) used. 3. The degree of discomfort experienced by the patient at the time of the procedure. 4. The patients ability and reliability in recalling and recording the events. 5. The presence and influence of possible secondary gains and/or psychosocial factors. Reported result: Relief experienced during the 1st hour after the procedure: 100 % (Ultra-Short Term Relief)            Interpretative annotation: Clinically appropriate result. Analgesia during this period is likely to be Local Anesthetic  and/or IV Sedative (Analgesic/Anxiolytic) related.          Effects of local anesthetic: The analgesic effects attained during this period are directly associated to the localized infiltration of local anesthetics and therefore cary significant diagnostic value as to the etiological  location, or anatomical origin, of the pain. Expected duration of relief is directly dependent on the pharmacodynamics of the local anesthetic used. Long-acting (4-6 hours) anesthetics used.  Reported result: Relief during the next 4 to 6 hour after the procedure: 100 % (Short-Term Relief)            Interpretative annotation: Clinically appropriate result. Analgesia during this period is likely to be Local Anesthetic-related.          Long-term benefit: Defined as the period of time past the expected duration of local anesthetics (1 hour for short-acting and 4-6 hours for long-acting). With the possible exception of prolonged sympathetic blockade from the local anesthetics, benefits during this period are typically attributed to, or associated with, other factors such as analgesic sensory neuropraxia, antiinflammatory effects, or beneficial biochemical changes provided by agents other than the local anesthetics.  Reported result: Extended relief following procedure: 50 % (Long-Term Relief)            Interpretative annotation: Clinically appropriate result. Good relief. No permanent benefit expected. Inflammation plays a part in the etiology to the pain.          Current benefits: Defined as reported results that persistent at this point in time.   Analgesia: 25-50 % Pamela Daniel reports that both, extremity and the axial pain improved with the treatment. Function: Somewhat improved ROM: Somewhat improved Interpretative annotation: Recurrence of symptoms. Therapeutic benefit observed. Effective diagnostic intervention.          Interpretation: Results would suggest a successful diagnostic intervention. We'll proceed with diagnostic intervention #2, as soon as convenient          Plan:  Please see "Plan of Care" for details.                Laboratory Chemistry  Inflammation Markers (CRP: Acute Phase) (ESR: Chronic Phase) Lab Results  Component Value Date   ESRSEDRATE 10 08/01/2007                          Rheumatology Markers No results found for: Elayne Guerin, [facility]              Renal Function Markers Lab Results  Component Value Date   BUN 11 12/25/2016   CREATININE 0.66 12/25/2016   GFRAA >60 12/25/2016   GFRNONAA >60 12/25/2016                 Hepatic Function Markers Lab Results  Component Value Date   AST 24 09/22/2015   ALT 15 09/22/2015   ALBUMIN 4.0 09/22/2015   ALKPHOS 88 09/22/2015   HCVAB NEGATIVE 03/15/2014   LIPASE 22 09/22/2015                 Electrolytes Lab Results  Component Value Date   NA 139 12/25/2016   K 3.6 12/25/2016   CL 108 12/25/2016   CALCIUM 9.1 12/25/2016                        Neuropathy Markers Lab Results  Component Value Date   VITAMINB12 541 03/10/2012   FOLATE 11.6 08/01/2007   HGBA1C 5.6 12/22/2011  Bone Pathology Markers No results found for: Marveen Reeks, [id number], [id number], 25OHVITD1, 25OHVITD2, 25OHVITD3, TESTOFREE, TESTOSTERONE                       Coagulation Parameters Lab Results  Component Value Date   INR 0.9 ratio 01/14/2010   LABPROT 9.5 (L) 01/14/2010   APTT 27.2 01/14/2010   PLT 219 12/25/2016   DDIMER  05/14/2009    <0.22        AT THE INHOUSE ESTABLISHED CUTOFF VALUE OF 0.48 ug/mL FEU, THIS ASSAY HAS BEEN DOCUMENTED IN THE LITERATURE TO HAVE A SENSITIVITY AND NEGATIVE PREDICTIVE VALUE OF AT LEAST 98 TO 99%.  THE TEST RESULT SHOULD BE CORRELATED WITH AN ASSESSMENT OF THE CLINICAL PROBABILITY OF DVT / VTE.                 Cardiovascular Markers Lab Results  Component Value Date   BNP 15.4 04/06/2015   TROPONINI <0.30 12/22/2011   HGB 15.7 12/25/2016   HCT 46.2 12/25/2016                 CA Markers No results found for: CEA, CA125, LABCA2               Note: Lab results reviewed.  Recent Diagnostic Imaging Results  DG C-Arm 1-60 Min-No Report Fluoroscopy was utilized by the requesting physician.  No radiographic    interpretation.   Complexity Note: Imaging results reviewed. Results shared with Pamela Daniel, using Layman's terms.                         Meds   Current Outpatient Medications:  .  acetaminophen (TYLENOL) 325 MG tablet, Take 650 mg by mouth every 6 (six) hours as needed for mild pain., Disp: , Rfl:  .  albuterol (PROVENTIL) (2.5 MG/3ML) 0.083% nebulizer solution, Take 6 mLs (5 mg total) by nebulization once., Disp: 75 mL, Rfl: 12 .  ALPRAZolam (XANAX) 0.25 MG tablet, Take 0.25 mg by mouth at bedtime as needed for anxiety., Disp: , Rfl:  .  aspirin 325 MG tablet, Take 325 mg by mouth daily., Disp: , Rfl:  .  budesonide-formoterol (SYMBICORT) 160-4.5 MCG/ACT inhaler, Inhale 2 puffs into the lungs 2 (two) times daily., Disp: , Rfl:  .  cyclobenzaprine (FLEXERIL) 10 MG tablet, Take 10 mg by mouth 3 (three) times daily as needed for muscle spasms., Disp: , Rfl:  .  oxyCODONE (OXY IR/ROXICODONE) 5 MG immediate release tablet, Take 1-2 tablets (5-10 mg total) by mouth every 6 (six) hours as needed., Disp: 30 tablet, Rfl: 0 .  triamcinolone cream (KENALOG) 0.1 %, Apply 1 application topically 2 (two) times daily as needed. , Disp: , Rfl:   ROS  Constitutional: Denies any fever or chills Gastrointestinal: No reported hemesis, hematochezia, vomiting, or acute GI distress Musculoskeletal: Denies any acute onset joint swelling, redness, loss of ROM, or weakness Neurological: No reported episodes of acute onset apraxia, aphasia, dysarthria, agnosia, amnesia, paralysis, loss of coordination, or loss of consciousness  Allergies  Pamela Daniel has No Known Allergies.  [place]  Drug: Pamela Daniel  reports that she does not use drugs. Alcohol:  reports that she does not drink alcohol. Tobacco:  reports that  has never smoked. she has never used smokeless tobacco. Medical:  has a past medical history of Anxiety, Arthritis, Asthma, First degree heart block, Frequency of urination, Hemorrhoids, Hiatal hernia,  History of acute pancreatitis (03/16/2015),  History of adenomatous polyp of colon, History of concussion (2012), History of transient ischemic attack (TIA) (12/22/2011), History of vaginal dysplasia (2016), Hypertension, Pseudocyst of pancreas, PSVT (paroxysmal supraventricular tachycardia) ([place]), Rectal bleeding, S/P AV (atrioventricular) nodal ablation (01-21-2010    dr Caryl Comes), Simple renal cyst, and Urgency of urination. Surgical: Pamela Daniel  has a past surgical history that includes Cardiac electrophysiology mapping and ablation (01-21-2010   dr Caryl Comes); Foot neuroma surgery (Left, 1996); Nasal septum surgery (1991); TEE without cardioversion (02/15/2012); Vaginal hysterectomy (1996); LAPAROSCOPY TAKEDOWN AND REPAIR HIATAL HERNIA /  NISSEN FUNDOPLATION (09-01-2005    dr Hassell Done  [facility]); transthoracic echocardiogram (12-22-2011    dr Einar Gip); Hemorrhoid surgery (N/A, 10/28/2016); and Breast excisional biopsy (Right). Family: family history includes Breast cancer in her cousin and maternal aunt; Breast cancer (age of onset: 3) in her mother; COPD in her father; Cancer in her brother, brother, and mother; Cancer (age of onset: 82) in her brother; Dementia in her mother; Heart disease in her brother; Heart disease (age of onset: 67) in her mother; Heart disease (age of onset: 80) in her father; Hypertension in her mother; Mental illness in her brother.  Constitutional Exam  General appearance: Well nourished, well developed, and well hydrated. In no apparent acute distress Vitals:   03/23/17 0915  BP: 131/71  Pulse: 72  Temp: 98 F (36.7 C)  SpO2: 99%  Weight: 167 lb (75.8 kg)  Height: 5' 1"  (1.549 m)   BMI Assessment: Estimated body mass index is 31.55 kg/m as calculated from the following:   Height as of this encounter: 5' 1"  (1.549 m).   Weight as of this encounter: 167 lb (75.8 kg).  BMI interpretation table: BMI level Category Range association with higher incidence of chronic pain  <18  kg/m2 Underweight   18.5-24.9 kg/m2 Ideal body weight   25-29.9 kg/m2 Overweight Increased incidence by 20%  30-34.9 kg/m2 Obese (Class I) Increased incidence by 68%  35-39.9 kg/m2 Severe obesity (Class II) Increased incidence by 136%  >40 kg/m2 Extreme obesity (Class III) Increased incidence by 254%   BMI Readings from Last 4 Encounters:  03/23/17 31.55 kg/m  03/02/17 33.25 kg/m  02/16/17 31.55 kg/m  12/25/16 31.55 kg/m   Wt Readings from Last 4 Encounters:  03/23/17 167 lb (75.8 kg)  03/02/17 176 lb (79.8 kg)  02/16/17 167 lb (75.8 kg)  12/25/16 167 lb (75.8 kg)  Psych/Mental status: Alert, oriented x 3 (person, place, & time)       Eyes: PERLA Respiratory: No evidence of acute respiratory distress  Cervical Spine Area Exam  Skin & Axial Inspection: No masses, redness, edema, swelling, or associated skin lesions Alignment: Symmetrical Functional ROM: Unrestricted ROM      Stability: No instability detected Muscle Tone/Strength: Functionally intact. No obvious neuro-muscular anomalies detected. Sensory (Neurological): Unimpaired Palpation: No palpable anomalies              Upper Extremity (UE) Exam    Side: Right upper extremity  Side: Left upper extremity  Skin & Extremity Inspection: Skin color, temperature, and hair growth are WNL. No peripheral edema or cyanosis. No masses, redness, swelling, asymmetry, or associated skin lesions. No contractures.  Skin & Extremity Inspection: Skin color, temperature, and hair growth are WNL. No peripheral edema or cyanosis. No masses, redness, swelling, asymmetry, or associated skin lesions. No contractures.  Functional ROM: Unrestricted ROM          Functional ROM: Unrestricted ROM  Muscle Tone/Strength: Functionally intact. No obvious neuro-muscular anomalies detected.  Muscle Tone/Strength: Functionally intact. No obvious neuro-muscular anomalies detected.  Sensory (Neurological): Unimpaired          Sensory (Neurological):  Unimpaired          Palpation: No palpable anomalies              Palpation: No palpable anomalies              Specialized Test(s): Deferred         Specialized Test(s): Deferred          Thoracic Spine Area Exam  Skin & Axial Inspection: No masses, redness, or swelling Alignment: Symmetrical Functional ROM: Unrestricted ROM Stability: No instability detected Muscle Tone/Strength: Functionally intact. No obvious neuro-muscular anomalies detected. Sensory (Neurological): Unimpaired Muscle strength & Tone: No palpable anomalies  Lumbar Spine Area Exam  Skin & Axial Inspection: No masses, redness, or swelling Alignment: Symmetrical Functional ROM: Unrestricted ROM      Stability: No instability detected Muscle Tone/Strength: Functionally intact. No obvious neuro-muscular anomalies detected. Sensory (Neurological): Unimpaired Palpation: No palpable anomalies       Provocative Tests: Lumbar Hyperextension and rotation test: Positive bilaterally for facet joint pain. Lumbar Lateral bending test: Positive due to pain. Patrick's Maneuver: evaluation deferred today                    Gait & Posture Assessment  Ambulation: Unassisted Gait: Relatively normal for age and body habitus Posture: WNL   Lower Extremity Exam    Side: Right lower extremity  Side: Left lower extremity  Skin & Extremity Inspection: Skin color, temperature, and hair growth are WNL. No peripheral edema or cyanosis. No masses, redness, swelling, asymmetry, or associated skin lesions. No contractures.  Skin & Extremity Inspection: Skin color, temperature, and hair growth are WNL. No peripheral edema or cyanosis. No masses, redness, swelling, asymmetry, or associated skin lesions. No contractures.  Functional ROM: Unrestricted ROM          Functional ROM: Unrestricted ROM          Muscle Tone/Strength: Functionally intact. No obvious neuro-muscular anomalies detected.  Muscle Tone/Strength: Functionally intact. No obvious  neuro-muscular anomalies detected.  Sensory (Neurological): Unimpaired  Sensory (Neurological): Unimpaired  Palpation: No palpable anomalies  Palpation: No palpable anomalies   Assessment  Primary Diagnosis & Pertinent Problem List: The primary encounter diagnosis was Lumbar spondylosis. Diagnoses of Lumbar facet arthropathy, Lumbar degenerative disc disease, and Chronic pain syndrome were also pertinent to this visit.  Status Diagnosis  Responding Responding Persistent 1. Lumbar spondylosis   2. Lumbar facet arthropathy   3. Lumbar degenerative disc disease   4. Chronic pain syndrome      67 year old female with a history of axial low back pain which radiates down into bilateral hips and along her posterior thighs status post diagnostic bilateral L3, L4, L5 facet medial branch nerve blocks who returns for follow-up. DREW HERMAN has a history of greater than 3 months of moderate to severe pain which is resulted in functional impairment.  The patient has tried various conservative therapeutic options such as NSAIDs, Tylenol, muscle relaxants, physical therapy which was inadequately effective.  Patient's pain is predominantly axial with physical exam findings suggestive of facet arthropathy.   Patient endorses significant pain relief that is ongoing since her medial branch nerve block.  She states that for the first week she had approximately 100% pain relief and that currently she is expressing about  40-50% pain relief.  She finds activities of daily living easier to perform and is able to ambulate for longer period of time without having as much pain that radiates down into her low back and buttocks.  Since the patient had a positive diagnostic block with greater than 80% pain relief for at least the first week and ongoing pain relief at approximately 50% currently, discussed repeating lumbar facet medial branch nerve block #2 and possibly proceeding with lumbar radio frequency ablation  thereafter.   Risks and benefits were reviewed.  Patient would like to proceed with repeat bilateral L3, L4, L5 medial branch nerve block.   Plan: -Repeat lumbar facet medial branch nerve block at L3, L4, L5.  Patient instructed to stop her aspirin 325 5 days prior to scheduled procedure take 81 mg of aspirin instead.  -Future considerations: Cervical ESI, cervical facet medial branch nerve blocks (for  cervical radiculopathy and spondylosis respectively)  Lab-work, procedure(s), and/or referral(s): Orders Placed This Encounter  Procedures  . LUMBAR FACET(MEDIAL BRANCH NERVE BLOCK) MBNB    Provider-requested follow-up: Return in about 2 weeks (around 04/06/2017) for Procedure. Time Note: Greater than 50% of the 25 minute(s) of face-to-face time spent with Pamela Daniel, was spent in counseling/coordination of care regarding: the treatment plan, treatment alternatives, the risks and possible complications of proposed treatment, going over the informed consent, the results, interpretation and significance of  her recent diagnostic interventional treatment(s), realistic expectations and the goals of pain management (increased in functionality). No future appointments.  Primary Care Physician: Velna Hatchet, MD Location: [facility] Note by: Gillis Santa, M.D Date: 03/23/2017; Time: 1:42 PM  Patient Instructions   1. Repeat bilateral L3, L4, L5 medial branch nerve block with sedation. PLEASE STOP ASA 325 mg 5 days prior to scheduled procedure, ok to take 81 mg ASA instead.GENERAL RISKS AND COMPLICATIONS  What are the risk, side effects and possible complications? Generally speaking, most procedures are safe.  However, with any procedure there are risks, side effects, and the possibility of complications.  The risks and complications are dependent upon the sites that are lesioned, or the type of nerve block to be performed.  The closer the procedure is to the spine,  the more serious the risks are.  Great care is taken when placing the radio frequency needles, block needles or lesioning probes, but sometimes complications can occur. 1. Infection: Any time there is an injection through the skin, there is a risk of infection.  This is why sterile conditions are used for these blocks.  There are four possible types of infection. 1. Localized skin infection. 2. Central Nervous System Infection-This can be in the form of Meningitis, which can be deadly. 3. Epidural Infections-This can be in the form of an epidural abscess, which can cause pressure inside of the spine, causing compression of the spinal cord with subsequent paralysis. This would require an emergency surgery to decompress, and there are no guarantees that the patient would recover from the paralysis. 4. Discitis-This is an infection of the intervertebral discs.  It occurs in about 1% of discography procedures.  It is difficult to treat and it may lead to surgery.        2. Pain: the needles have to go through skin and soft tissues, will cause soreness.       3. Damage to internal structures:  The nerves to be lesioned may be near blood vessels or    other nerves which can be  potentially damaged.       4. Bleeding: Bleeding is more common if the patient is taking blood thinners such as  aspirin, Coumadin, Ticiid, Plavix, etc., or if he/she have some genetic predisposition  such as hemophilia. Bleeding into the spinal canal can cause compression of the spinal  cord with subsequent paralysis.  This would require an emergency surgery to  decompress and there are no guarantees that the patient would recover from the  paralysis.       5. Pneumothorax:  Puncturing of a lung is a possibility, every time a needle is introduced in  the area of the chest or upper back.  Pneumothorax refers to free air around the  collapsed lung(s), inside of the thoracic cavity (chest cavity).  Another two possible  complications  related to a similar event would include: Hemothorax and Chylothorax.   These are variations of the Pneumothorax, where instead of air around the collapsed  lung(s), you may have blood or chyle, respectively.       6. Spinal headaches: They may occur with any procedures in the area of the spine.       7. Persistent CSF (Cerebro-Spinal Fluid) leakage: This is a rare problem, but may occur  with prolonged intrathecal or epidural catheters either due to the formation of a fistulous  track or a dural tear.       8. Nerve damage: By working so close to the spinal cord, there is always a possibility of  nerve damage, which could be as serious as a permanent spinal cord injury with  paralysis.       9. Death:  Although rare, severe deadly allergic reactions known as "Anaphylactic  reaction" can occur to any of the medications used.      10. Worsening of the symptoms:  We can always make thing worse.  What are the chances of something like this happening? Chances of any of this occuring are extremely low.  By statistics, you have more of a chance of getting killed in a motor vehicle accident: while driving to the hospital than any of the above occurring .  Nevertheless, you should be aware that they are possibilities.  In general, it is similar to taking a shower.  Everybody knows that you can slip, hit your head and get killed.  Does that mean that you should not shower again?  Nevertheless always keep in mind that statistics do not mean anything if you happen to be on the wrong side of them.  Even if a procedure has a 1 (one) in a 1,000,000 (million) chance of going wrong, it you happen to be that one..Also, keep in mind that by statistics, you have more of a chance of having something go wrong when taking medications.  Who should not have this procedure? If you are on a blood thinning medication (e.g. Coumadin, Plavix, see list of "Blood Thinners"), or if you have an active infection going on, you should not  have the procedure.  If you are taking any blood thinners, please inform your physician.  How should I prepare for this procedure?  Do not eat or drink anything at least six hours prior to the procedure.  Bring a driver with you .  It cannot be a taxi.  Come accompanied by an adult that can drive you back, and that is strong enough to help you if your legs get weak or numb from the local anesthetic.  Take all of your medicines the  morning of the procedure with just enough water to swallow them.  If you have diabetes, make sure that you are scheduled to have your procedure done first thing in the morning, whenever possible.  If you have diabetes, take only half of your insulin dose and notify our nurse that you have done so as soon as you arrive at the clinic.  If you are diabetic, but only take blood sugar pills (oral hypoglycemic), then do not take them on the morning of your procedure.  You may take them after you have had the procedure.  Do not take aspirin or any aspirin-containing medications, at least eleven (11) days prior to the procedure.  They may prolong bleeding.  Wear loose fitting clothing that may be easy to take off and that you would not mind if it got stained with Betadine or blood.  Do not wear any jewelry or perfume  Remove any nail coloring.  It will interfere with some of our monitoring equipment.  NOTE: Remember that this is not meant to be interpreted as a complete list of all possible complications.  Unforeseen problems may occur.  BLOOD THINNERS The following drugs contain aspirin or other products, which can cause increased bleeding during surgery and should not be taken for 2 weeks prior to and 1 week after surgery.  If you should need take something for relief of minor pain, you may take acetaminophen which is found in Tylenol,m Datril, Anacin-3 and Panadol. It is not blood thinner. The products listed below are.  Do not take any of the products listed below  in addition to any listed on your instruction sheet.  A.P.C or A.P.C with Codeine Codeine Phosphate Capsules #3 Ibuprofen Ridaura  ABC compound Congesprin Imuran rimadil  Advil Cope Indocin Robaxisal  Alka-Seltzer Effervescent Pain Reliever and Antacid Coricidin or Coricidin-D  Indomethacin Rufen  Alka-Seltzer plus Cold Medicine Cosprin Ketoprofen S-A-C Tablets  Anacin Analgesic Tablets or Capsules Coumadin Korlgesic Salflex  Anacin Extra Strength Analgesic tablets or capsules CP-2 Tablets Lanoril Salicylate  Anaprox Cuprimine Capsules Levenox Salocol  Anexsia-D Dalteparin Magan Salsalate  Anodynos Darvon compound Magnesium Salicylate Sine-off  Ansaid Dasin Capsules Magsal Sodium Salicylate  Anturane Depen Capsules Marnal Soma  APF Arthritis pain formula Dewitt's Pills Measurin Stanback  Argesic Dia-Gesic Meclofenamic Sulfinpyrazone  Arthritis Bayer Timed Release Aspirin Diclofenac Meclomen Sulindac  Arthritis pain formula Anacin Dicumarol Medipren Supac  Analgesic (Safety coated) Arthralgen Diffunasal Mefanamic Suprofen  Arthritis Strength Bufferin Dihydrocodeine Mepro Compound Suprol  Arthropan liquid Dopirydamole Methcarbomol with Aspirin Synalgos  ASA tablets/Enseals Disalcid Micrainin Tagament  Ascriptin Doan's Midol Talwin  Ascriptin A/D Dolene Mobidin Tanderil  Ascriptin Extra Strength Dolobid Moblgesic Ticlid  Ascriptin with Codeine Doloprin or Doloprin with Codeine Momentum Tolectin  Asperbuf Duoprin Mono-gesic Trendar  Aspergum Duradyne Motrin or Motrin IB Triminicin  Aspirin plain, buffered or enteric coated Durasal Myochrisine Trigesic  Aspirin Suppositories Easprin Nalfon Trillsate  Aspirin with Codeine Ecotrin Regular or Extra Strength Naprosyn Uracel  Atromid-S Efficin Naproxen Ursinus  Auranofin Capsules Elmiron Neocylate Vanquish  Axotal Emagrin Norgesic Verin  Azathioprine Empirin or Empirin with Codeine Normiflo Vitamin E  Azolid Emprazil Nuprin Voltaren  Bayer  Aspirin plain, buffered or children's or timed BC Tablets or powders Encaprin Orgaran Warfarin Sodium  Buff-a-Comp Enoxaparin Orudis Zorpin  Buff-a-Comp with Codeine Equegesic Os-Cal-Gesic   Buffaprin Excedrin plain, buffered or Extra Strength Oxalid   Bufferin Arthritis Strength Feldene Oxphenbutazone   Bufferin plain or Extra Strength Feldene Capsules Oxycodone with Aspirin  Bufferin with Codeine Fenoprofen Fenoprofen Pabalate or Pabalate-SF   Buffets II Flogesic Panagesic   Buffinol plain or Extra Strength Florinal or Florinal with Codeine Panwarfarin   Buf-Tabs Flurbiprofen Penicillamine   Butalbital Compound Four-way cold tablets Penicillin   Butazolidin Fragmin Pepto-Bismol   Carbenicillin Geminisyn Percodan   Carna Arthritis Reliever Geopen Persantine   Carprofen Gold's salt Persistin   Chloramphenicol Goody's Phenylbutazone   Chloromycetin Haltrain Piroxlcam   Clmetidine heparin Plaquenil   Cllnoril Hyco-pap Ponstel   Clofibrate Hydroxy chloroquine Propoxyphen         Before stopping any of these medications, be sure to consult the physician who ordered them.  Some, such as Coumadin (Warfarin) are ordered to prevent or treat serious conditions such as "deep thrombosis", "pumonary embolisms", and other heart problems.  The amount of time that you may need off of the medication may also vary with the medication and the reason for which you were taking it.  If you are taking any of these medications, please make sure you notify your pain physician before you undergo any procedures.          Facet Joint Block The facet joints connect the bones of the spine (vertebrae). They make it possible for you to bend, twist, and make other movements with your spine. They also keep you from bending too far, twisting too far, and making other excessive movements. A facet joint block is a procedure where a numbing medicine (anesthetic) is injected into a facet joint. Often, a type of  anti-inflammatory medicine called a steroid is also injected. A facet joint block may be done to diagnose neck or back pain. If the pain gets better after a facet joint block, it means the pain is probably coming from the facet joint. If the pain does not get better, it means the pain is probably not coming from the facet joint. A facet joint block may also be done to relieve neck or back pain caused by an inflamed facet joint. A facet joint block is only done to relieve pain if the pain does not improve with other methods, such as medicine, exercise programs, and physical therapy. Tell a health care provider about:  Any allergies you have.  All medicines you are taking, including vitamins, herbs, eye drops, creams, and over-the-counter medicines.  Any problems you or family members have had with anesthetic medicines.  Any blood disorders you have.  Any surgeries you have had.  Any medical conditions you have.  Whether you are pregnant or may be pregnant. What are the risks? Generally, this is a safe procedure. However, problems may occur, including:  Bleeding.  Injury to a nerve near the injection site.  Pain at the injection site.  Weakness or numbness in areas controlled by nerves near the injection site.  Infection.  Temporary fluid retention.  Allergic reactions to medicines or dyes.  Injury to other structures or organs near the injection site.  What happens before the procedure?  Follow instructions from your health care provider about eating or drinking restrictions.  Ask your health care provider about: ? Changing or stopping your regular medicines. This is especially important if you are taking diabetes medicines or blood thinners. ? Taking medicines such as aspirin and ibuprofen. These medicines can thin your blood. Do not take these medicines before your procedure if your health care provider instructs you not to.  Do not take any new dietary supplements or  medicines without asking your health care provider first.  Plan to have someone take you home after the procedure. What happens during the procedure?  You may need to remove your clothing and dress in an open-back gown.  The procedure will be done while you are lying on an X-ray table. You will most likely be asked to lie on your stomach, but you may be asked to lie in a different position if an injection will be made in your neck.  Machines will be used to monitor your oxygen levels, heart rate, and blood pressure.  If an injection will be made in your neck, an IV tube will be inserted into one of your veins. Fluids and medicine will flow directly into your body through the IV tube.  The area over the facet joint where the injection will be made will be cleaned with soap. The surrounding skin will be covered with clean drapes.  A numbing medicine (local anesthetic) will be applied to your skin. Your skin may sting or burn for a moment.  A video X-ray machine (fluoroscopy) will be used to locate the joint. In some cases, a CT scan may be used.  A contrast dye may be injected into the facet joint area to help locate the joint.  When the joint is located, an anesthetic will be injected into the joint through the needle.  Your health care provider will ask you whether you feel pain relief. If you do feel relief, a steroid may be injected to provide pain relief for a longer period of time. If you do not feel relief or feel only partial relief, additional injections of an anesthetic may be made in other facet joints.  The needle will be removed.  Your skin will be cleaned.  A bandage (dressing) will be applied over each injection site. The procedure may vary among health care providers and hospitals. What happens after the procedure?  You will be observed for 15-30 minutes before being allowed to go home. This information is not intended to replace advice given to you by your health care  provider. Make sure you discuss any questions you have with your health care provider. Document Released: 05/26/2006 Document Revised: 02/05/2015 Document Reviewed: 09/30/2014 Elsevier Interactive Patient Education  [employer].

## 2017-03-23 NOTE — Patient Instructions (Addendum)
1. Repeat bilateral L3, L4, L5 medial branch nerve block with sedation. PLEASE STOP ASA 325 mg 5 days prior to scheduled procedure, ok to take 81 mg ASA instead.GENERAL RISKS AND COMPLICATIONS  What are the risk, side effects and possible complications? Generally speaking, most procedures are safe.  However, with any procedure there are risks, side effects, and the possibility of complications.  The risks and complications are dependent upon the sites that are lesioned, or the type of nerve block to be performed.  The closer the procedure is to the spine, the more serious the risks are.  Great care is taken when placing the radio frequency needles, block needles or lesioning probes, but sometimes complications can occur. 1. Infection: Any time there is an injection through the skin, there is a risk of infection.  This is why sterile conditions are used for these blocks.  There are four possible types of infection. 1. Localized skin infection. 2. Central Nervous System Infection-This can be in the form of Meningitis, which can be deadly. 3. Epidural Infections-This can be in the form of an epidural abscess, which can cause pressure inside of the spine, causing compression of the spinal cord with subsequent paralysis. This would require an emergency surgery to decompress, and there are no guarantees that the patient would recover from the paralysis. 4. Discitis-This is an infection of the intervertebral discs.  It occurs in about 1% of discography procedures.  It is difficult to treat and it may lead to surgery.        2. Pain: the needles have to go through skin and soft tissues, will cause soreness.       3. Damage to internal structures:  The nerves to be lesioned may be near blood vessels or    other nerves which can be potentially damaged.       4. Bleeding: Bleeding is more common if the patient is taking blood thinners such as  aspirin, Coumadin, Ticiid, Plavix, etc., or if he/she have some genetic  predisposition  such as hemophilia. Bleeding into the spinal canal can cause compression of the spinal  cord with subsequent paralysis.  This would require an emergency surgery to  decompress and there are no guarantees that the patient would recover from the  paralysis.       5. Pneumothorax:  Puncturing of a lung is a possibility, every time a needle is introduced in  the area of the chest or upper back.  Pneumothorax refers to free air around the  collapsed lung(s), inside of the thoracic cavity (chest cavity).  Another two possible  complications related to a similar event would include: Hemothorax and Chylothorax.   These are variations of the Pneumothorax, where instead of air around the collapsed  lung(s), you may have blood or chyle, respectively.       6. Spinal headaches: They may occur with any procedures in the area of the spine.       7. Persistent CSF (Cerebro-Spinal Fluid) leakage: This is a rare problem, but may occur  with prolonged intrathecal or epidural catheters either due to the formation of a fistulous  track or a dural tear.       8. Nerve damage: By working so close to the spinal cord, there is always a possibility of  nerve damage, which could be as serious as a permanent spinal cord injury with  paralysis.       9. Death:  Although rare, severe deadly allergic reactions known  as "Anaphylactic  reaction" can occur to any of the medications used.      10. Worsening of the symptoms:  We can always make thing worse.  What are the chances of something like this happening? Chances of any of this occuring are extremely low.  By statistics, you have more of a chance of getting killed in a motor vehicle accident: while driving to the hospital than any of the above occurring .  Nevertheless, you should be aware that they are possibilities.  In general, it is similar to taking a shower.  Everybody knows that you can slip, hit your head and get killed.  Does that mean that you should not  shower again?  Nevertheless always keep in mind that statistics do not mean anything if you happen to be on the wrong side of them.  Even if a procedure has a 1 (one) in a 1,000,000 (million) chance of going wrong, it you happen to be that one..Also, keep in mind that by statistics, you have more of a chance of having something go wrong when taking medications.  Who should not have this procedure? If you are on a blood thinning medication (e.g. Coumadin, Plavix, see list of "Blood Thinners"), or if you have an active infection going on, you should not have the procedure.  If you are taking any blood thinners, please inform your physician.  How should I prepare for this procedure?  Do not eat or drink anything at least six hours prior to the procedure.  Bring a driver with you .  It cannot be a taxi.  Come accompanied by an adult that can drive you back, and that is strong enough to help you if your legs get weak or numb from the local anesthetic.  Take all of your medicines the morning of the procedure with just enough water to swallow them.  If you have diabetes, make sure that you are scheduled to have your procedure done first thing in the morning, whenever possible.  If you have diabetes, take only half of your insulin dose and notify our nurse that you have done so as soon as you arrive at the clinic.  If you are diabetic, but only take blood sugar pills (oral hypoglycemic), then do not take them on the morning of your procedure.  You may take them after you have had the procedure.  Do not take aspirin or any aspirin-containing medications, at least eleven (11) days prior to the procedure.  They may prolong bleeding.  Wear loose fitting clothing that may be easy to take off and that you would not mind if it got stained with Betadine or blood.  Do not wear any jewelry or perfume  Remove any nail coloring.  It will interfere with some of our monitoring equipment.  NOTE: Remember that  this is not meant to be interpreted as a complete list of all possible complications.  Unforeseen problems may occur.  BLOOD THINNERS The following drugs contain aspirin or other products, which can cause increased bleeding during surgery and should not be taken for 2 weeks prior to and 1 week after surgery.  If you should need take something for relief of minor pain, you may take acetaminophen which is found in Tylenol,m Datril, Anacin-3 and Panadol. It is not blood thinner. The products listed below are.  Do not take any of the products listed below in addition to any listed on your instruction sheet.  A.P.C or A.P.C with Codeine Codeine  Phosphate Capsules #3 Ibuprofen Ridaura  ABC compound Congesprin Imuran rimadil  Advil Cope Indocin Robaxisal  Alka-Seltzer Effervescent Pain Reliever and Antacid Coricidin or Coricidin-D  Indomethacin Rufen  Alka-Seltzer plus Cold Medicine Cosprin Ketoprofen S-A-C Tablets  Anacin Analgesic Tablets or Capsules Coumadin Korlgesic Salflex  Anacin Extra Strength Analgesic tablets or capsules CP-2 Tablets Lanoril Salicylate  Anaprox Cuprimine Capsules Levenox Salocol  Anexsia-D Dalteparin Magan Salsalate  Anodynos Darvon compound Magnesium Salicylate Sine-off  Ansaid Dasin Capsules Magsal Sodium Salicylate  Anturane Depen Capsules Marnal Soma  APF Arthritis pain formula Dewitt's Pills Measurin Stanback  Argesic Dia-Gesic Meclofenamic Sulfinpyrazone  Arthritis Bayer Timed Release Aspirin Diclofenac Meclomen Sulindac  Arthritis pain formula Anacin Dicumarol Medipren Supac  Analgesic (Safety coated) Arthralgen Diffunasal Mefanamic Suprofen  Arthritis Strength Bufferin Dihydrocodeine Mepro Compound Suprol  Arthropan liquid Dopirydamole Methcarbomol with Aspirin Synalgos  ASA tablets/Enseals Disalcid Micrainin Tagament  Ascriptin Doan's Midol Talwin  Ascriptin A/D Dolene Mobidin Tanderil  Ascriptin Extra Strength Dolobid Moblgesic Ticlid  Ascriptin with Codeine  Doloprin or Doloprin with Codeine Momentum Tolectin  Asperbuf Duoprin Mono-gesic Trendar  Aspergum Duradyne Motrin or Motrin IB Triminicin  Aspirin plain, buffered or enteric coated Durasal Myochrisine Trigesic  Aspirin Suppositories Easprin Nalfon Trillsate  Aspirin with Codeine Ecotrin Regular or Extra Strength Naprosyn Uracel  Atromid-S Efficin Naproxen Ursinus  Auranofin Capsules Elmiron Neocylate Vanquish  Axotal Emagrin Norgesic Verin  Azathioprine Empirin or Empirin with Codeine Normiflo Vitamin E  Azolid Emprazil Nuprin Voltaren  Bayer Aspirin plain, buffered or children's or timed BC Tablets or powders Encaprin Orgaran Warfarin Sodium  Buff-a-Comp Enoxaparin Orudis Zorpin  Buff-a-Comp with Codeine Equegesic Os-Cal-Gesic   Buffaprin Excedrin plain, buffered or Extra Strength Oxalid   Bufferin Arthritis Strength Feldene Oxphenbutazone   Bufferin plain or Extra Strength Feldene Capsules Oxycodone with Aspirin   Bufferin with Codeine Fenoprofen Fenoprofen Pabalate or Pabalate-SF   Buffets II Flogesic Panagesic   Buffinol plain or Extra Strength Florinal or Florinal with Codeine Panwarfarin   Buf-Tabs Flurbiprofen Penicillamine   Butalbital Compound Four-way cold tablets Penicillin   Butazolidin Fragmin Pepto-Bismol   Carbenicillin Geminisyn Percodan   Carna Arthritis Reliever Geopen Persantine   Carprofen Gold's salt Persistin   Chloramphenicol Goody's Phenylbutazone   Chloromycetin Haltrain Piroxlcam   Clmetidine heparin Plaquenil   Cllnoril Hyco-pap Ponstel   Clofibrate Hydroxy chloroquine Propoxyphen         Before stopping any of these medications, be sure to consult the physician who ordered them.  Some, such as Coumadin (Warfarin) are ordered to prevent or treat serious conditions such as "deep thrombosis", "pumonary embolisms", and other heart problems.  The amount of time that you may need off of the medication may also vary with the medication and the reason for which  you were taking it.  If you are taking any of these medications, please make sure you notify your pain physician before you undergo any procedures.          Facet Joint Block The facet joints connect the bones of the spine (vertebrae). They make it possible for you to bend, twist, and make other movements with your spine. They also keep you from bending too far, twisting too far, and making other excessive movements. A facet joint block is a procedure where a numbing medicine (anesthetic) is injected into a facet joint. Often, a type of anti-inflammatory medicine called a steroid is also injected. A facet joint block may be done to diagnose neck or back pain. If the pain gets better  after a facet joint block, it means the pain is probably coming from the facet joint. If the pain does not get better, it means the pain is probably not coming from the facet joint. A facet joint block may also be done to relieve neck or back pain caused by an inflamed facet joint. A facet joint block is only done to relieve pain if the pain does not improve with other methods, such as medicine, exercise programs, and physical therapy. Tell a health care provider about:  Any allergies you have.  All medicines you are taking, including vitamins, herbs, eye drops, creams, and over-the-counter medicines.  Any problems you or family members have had with anesthetic medicines.  Any blood disorders you have.  Any surgeries you have had.  Any medical conditions you have.  Whether you are pregnant or may be pregnant. What are the risks? Generally, this is a safe procedure. However, problems may occur, including:  Bleeding.  Injury to a nerve near the injection site.  Pain at the injection site.  Weakness or numbness in areas controlled by nerves near the injection site.  Infection.  Temporary fluid retention.  Allergic reactions to medicines or dyes.  Injury to other structures or organs near the  injection site.  What happens before the procedure?  Follow instructions from your health care provider about eating or drinking restrictions.  Ask your health care provider about: ? Changing or stopping your regular medicines. This is especially important if you are taking diabetes medicines or blood thinners. ? Taking medicines such as aspirin and ibuprofen. These medicines can thin your blood. Do not take these medicines before your procedure if your health care provider instructs you not to.  Do not take any new dietary supplements or medicines without asking your health care provider first.  Plan to have someone take you home after the procedure. What happens during the procedure?  You may need to remove your clothing and dress in an open-back gown.  The procedure will be done while you are lying on an X-ray table. You will most likely be asked to lie on your stomach, but you may be asked to lie in a different position if an injection will be made in your neck.  Machines will be used to monitor your oxygen levels, heart rate, and blood pressure.  If an injection will be made in your neck, an IV tube will be inserted into one of your veins. Fluids and medicine will flow directly into your body through the IV tube.  The area over the facet joint where the injection will be made will be cleaned with soap. The surrounding skin will be covered with clean drapes.  A numbing medicine (local anesthetic) will be applied to your skin. Your skin may sting or burn for a moment.  A video X-ray machine (fluoroscopy) will be used to locate the joint. In some cases, a CT scan may be used.  A contrast dye may be injected into the facet joint area to help locate the joint.  When the joint is located, an anesthetic will be injected into the joint through the needle.  Your health care provider will ask you whether you feel pain relief. If you do feel relief, a steroid may be injected to provide  pain relief for a longer period of time. If you do not feel relief or feel only partial relief, additional injections of an anesthetic may be made in other facet joints.  The needle will  be removed.  Your skin will be cleaned.  A bandage (dressing) will be applied over each injection site. The procedure may vary among health care providers and hospitals. What happens after the procedure?  You will be observed for 15-30 minutes before being allowed to go home. This information is not intended to replace advice given to you by your health care provider. Make sure you discuss any questions you have with your health care provider. Document Released: 05/26/2006 Document Revised: 02/05/2015 Document Reviewed: 09/30/2014 [employer] Interactive Patient Education  [employer].

## 2017-04-06 ENCOUNTER — Ambulatory Visit
Admission: RE | Admit: 2017-04-06 | Discharge: 2017-04-06 | Disposition: A | Discharge status: Home / Self Care | Payer: Medicare HMO | Source: Ambulatory Visit | Attending: Student in an Organized Health Care Education/Training Program | Admitting: Student in an Organized Health Care Education/Training Program

## 2017-04-06 ENCOUNTER — Other Ambulatory Visit: Payer: Self-pay

## 2017-04-06 ENCOUNTER — Encounter: Payer: Self-pay | Admitting: Student in an Organized Health Care Education/Training Program

## 2017-04-06 ENCOUNTER — Ambulatory Visit (HOSPITAL_BASED_OUTPATIENT_CLINIC_OR_DEPARTMENT_OTHER): Payer: Medicare HMO | Admitting: Student in an Organized Health Care Education/Training Program

## 2017-04-06 DIAGNOSIS — M47896 Other spondylosis, lumbar region: Secondary | ICD-10-CM | POA: Diagnosis not present

## 2017-04-06 DIAGNOSIS — K449 Diaphragmatic hernia without obstruction or gangrene: Secondary | ICD-10-CM | POA: Insufficient documentation

## 2017-04-06 DIAGNOSIS — M545 Low back pain: Secondary | ICD-10-CM | POA: Insufficient documentation

## 2017-04-06 DIAGNOSIS — M47816 Spondylosis without myelopathy or radiculopathy, lumbar region: Secondary | ICD-10-CM | POA: Diagnosis not present

## 2017-04-06 MED ORDER — LACTATED RINGERS IV SOLN
1000.0000 mL | Freq: Once | INTRAVENOUS | Status: AC
  Administered 2017-04-06: 1000 mL via INTRAVENOUS

## 2017-04-06 MED ORDER — LIDOCAINE HCL 1 % IJ SOLN
10.0000 mL | Freq: Once | INTRAMUSCULAR | Status: AC
  Administered 2017-04-06: 10 mL
  Filled 2017-04-06: qty 10

## 2017-04-06 MED ORDER — ROPIVACAINE HCL 2 MG/ML IJ SOLN
10.0000 mL | Freq: Once | INTRAMUSCULAR | Status: AC
  Administered 2017-04-06: 10 mL
  Filled 2017-04-06: qty 10

## 2017-04-06 MED ORDER — FENTANYL CITRATE (PF) 100 MCG/2ML IJ SOLN
25.0000 ug | INTRAMUSCULAR | Status: DC | PRN
  Administered 2017-04-06: 25 ug via INTRAVENOUS
  Filled 2017-04-06: qty 2

## 2017-04-06 MED ORDER — DEXAMETHASONE SODIUM PHOSPHATE 10 MG/ML IJ SOLN
10.0000 mg | Freq: Once | INTRAMUSCULAR | Status: AC
  Administered 2017-04-06: 10 mg
  Filled 2017-04-06: qty 1

## 2017-04-06 NOTE — Progress Notes (Signed)
Safety precautions to be maintained throughout the outpatient stay will include: orient to surroundings, keep bed in low position, maintain call bell within reach at all times, provide assistance with transfer out of bed and ambulation.  

## 2017-04-06 NOTE — Patient Instructions (Signed)

## 2017-04-06 NOTE — Progress Notes (Signed)
Patient's Name: Pamela Daniel  MRN: [id number]  Referring Provider: Gillis Santa, MD  DOB: 01-Sep-1950  PCP: Velna Hatchet, MD  DOS: 04/06/2017  Note by: Gillis Santa, MD  Service setting: Ambulatory outpatient  Specialty: Interventional Pain Management  Patient type: Established  Location: [facility] (AMB) Pain Management Facility  Visit type: Interventional Procedure   Primary Reason for Visit: Interventional Pain Management Treatment. CC: Back Pain (lower)  Procedure:       Anesthesia, Analgesia, Anxiolysis:  Type: Lumbar Facet, Medial Branch Block(s) #2  Primary Purpose: Diagnostic Region: Posterolateral Lumbosacral Spine Level:  L3, L4, L5, Medial Branch Level(s). Injecting these levels blocks the  L4-5, and L5-S1 lumbar facet joints. Laterality: Bilateral  Type: Moderate (Conscious) Sedation combined with Local Anesthesia Indication(s): Analgesia and Anxiety Route: Intravenous (IV) IV Access: Secured Sedation: Meaningful verbal contact was maintained at all times during the procedure  Local Anesthetic: Lidocaine 1-2%   Indications: 1. Lumbar spondylosis    Pain Score: Pre-procedure: 4 /10 Post-procedure: 0-No pain/10  Pre-op Assessment:  Pamela Daniel is a 67 y.o. (year old), female patient, seen today for interventional treatment. She  has a past surgical history that includes Cardiac electrophysiology mapping and ablation (01-21-2010   dr Caryl Comes); Foot neuroma surgery (Left, 1996); Nasal septum surgery (1991); TEE without cardioversion (02/15/2012); Vaginal hysterectomy (1996); LAPAROSCOPY TAKEDOWN AND REPAIR HIATAL HERNIA /  NISSEN FUNDOPLATION (09-01-2005    dr Hassell Done  [facility]); transthoracic echocardiogram (12-22-2011    dr Einar Gip); Hemorrhoid surgery (N/A, 10/28/2016); and Breast excisional biopsy (Right). Pamela Daniel has a current medication list which includes the following prescription(s): acetaminophen, albuterol, alprazolam, aspirin, budesonide-formoterol, cyclobenzaprine,  oxycodone, and triamcinolone cream, and the following Facility-Administered Medications: fentanyl. Her primarily concern today is the Back Pain (lower)  Patient stopped ASA 325 mg 5 days prior to scheduled procedure.  Initial Vital Signs:  Pulse Rate: 77 Temp: 97.7 F (36.5 C) Resp: 16 BP: (!) 144/77 SpO2: 100 %  BMI: Estimated body mass index is 31.55 kg/m as calculated from the following:   Height as of this encounter: 5\' 1"  (1.549 m).   Weight as of this encounter: 167 lb (75.8 kg).  Risk Assessment: Allergies: Reviewed. She has No Known Allergies.  Allergy Precautions: None required Coagulopathies: Reviewed. None identified.  Blood-thinner therapy: None at this time Active Infection(s): Reviewed. None identified. Pamela Daniel is afebrile  Site Confirmation: Pamela Daniel was asked to confirm the procedure and laterality before marking the site Procedure checklist: Completed Consent: Before the procedure and under the influence of no sedative(s), amnesic(s), or anxiolytics, the patient was informed of the treatment options, risks and possible complications. To fulfill our ethical and legal obligations, as recommended by the American Medical Association's Code of Ethics, I have informed the patient of my clinical impression; the nature and purpose of the treatment or procedure; the risks, benefits, and possible complications of the intervention; the alternatives, including doing nothing; the risk(s) and benefit(s) of the alternative treatment(s) or procedure(s); and the risk(s) and benefit(s) of doing nothing. The patient was provided information about the general risks and possible complications associated with the procedure. These may include, but are not limited to: failure to achieve desired goals, infection, bleeding, organ or nerve damage, allergic reactions, paralysis, and death. In addition, the patient was informed of those risks and complications associated to Spine-related  procedures, such as failure to decrease pain; infection (i.e.: Meningitis, epidural or intraspinal abscess); bleeding (i.e.: epidural hematoma, subarachnoid hemorrhage, or any other type of intraspinal or peri-dural  bleeding); organ or nerve damage (i.e.: Any type of peripheral nerve, nerve root, or spinal cord injury) with subsequent damage to sensory, motor, and/or autonomic systems, resulting in permanent pain, numbness, and/or weakness of one or several areas of the body; allergic reactions; (i.e.: anaphylactic reaction); and/or death. Furthermore, the patient was informed of those risks and complications associated with the medications. These include, but are not limited to: allergic reactions (i.e.: anaphylactic or anaphylactoid reaction(s)); adrenal axis suppression; blood sugar elevation that in diabetics may result in ketoacidosis or comma; water retention that in patients with history of congestive heart failure may result in shortness of breath, pulmonary edema, and decompensation with resultant heart failure; weight gain; swelling or edema; medication-induced neural toxicity; particulate matter embolism and blood vessel occlusion with resultant organ, and/or nervous system infarction; and/or aseptic necrosis of one or more joints. Finally, the patient was informed that Medicine is not an exact science; therefore, there is also the possibility of unforeseen or unpredictable risks and/or possible complications that may result in a catastrophic outcome. The patient indicated having understood very clearly. We have given the patient no guarantees and we have made no promises. Enough time was given to the patient to ask questions, all of which were answered to the patient's satisfaction. Pamela Daniel has indicated that she wanted to continue with the procedure. Attestation: I, the ordering provider, attest that I have discussed with the patient the benefits, risks, side-effects, alternatives, likelihood of  achieving goals, and potential problems during recovery for the procedure that I have provided informed consent. Date  Time: 04/06/2017 11:12 AM  Pre-Procedure Preparation:  Monitoring: As per clinic protocol. Respiration, ETCO2, SpO2, BP, heart rate and rhythm monitor placed and checked for adequate function Safety Precautions: Patient was assessed for positional comfort and pressure points before starting the procedure. Time-out: I initiated and conducted the "Time-out" before starting the procedure, as per protocol. The patient was asked to participate by confirming the accuracy of the "Time Out" information. Verification of the correct person, site, and procedure were performed and confirmed by me, the nursing staff, and the patient. "Time-out" conducted as per Joint Commission's Universal Protocol (UP.01.01.01). Time: 1155  Description of Procedure:       Position: Prone Laterality: Bilateral. The procedure was performed in identical fashion on both sides. Levels: L3, L4, L5,  Medial Branch Level(s) Area Prepped: Posterior Lumbosacral Region Prepping solution: ChloraPrep (2% chlorhexidine gluconate and 70% isopropyl alcohol) Safety Precautions: Aspiration looking for blood return was conducted prior to all injections. At no point did we inject any substances, as a needle was being advanced. Before injecting, the patient was told to immediately notify me if she was experiencing any new onset of "ringing in the ears, or metallic taste in the mouth". No attempts were made at seeking any paresthesias. Safe injection practices and needle disposal techniques used. Medications properly checked for expiration dates. SDV (single dose vial) medications used. After the completion of the procedure, all disposable equipment used was discarded in the proper designated medical waste containers. Local Anesthesia: Protocol guidelines were followed. The patient was positioned over the fluoroscopy table. The area  was prepped in the usual manner. The time-out was completed. The target area was identified using fluoroscopy. A 12-in long, straight, sterile hemostat was used with fluoroscopic guidance to locate the targets for each level blocked. Once located, the skin was marked with an approved surgical skin marker. Once all sites were marked, the skin (epidermis, dermis, and hypodermis), as well as  deeper tissues (fat, connective tissue and muscle) were infiltrated with a small amount of a short-acting local anesthetic, loaded on a 10cc syringe with a 25G, 1.5-in  Needle. An appropriate amount of time was allowed for local anesthetics to take effect before proceeding to the next step. Local Anesthetic: Lidocaine 1.0% The unused portion of the local anesthetic was discarded in the proper designated containers. Technical explanation of process:   L3 Medial Branch Nerve Block (MBB): The target area for the L3 medial branch is at the junction of the postero-lateral aspect of the superior articular process and the superior, posterior, and medial edge of the transverse process of L4. Under fluoroscopic guidance, a Quincke needle was inserted until contact was made with os over the superior postero-lateral aspect of the pedicular shadow (target area). After negative aspiration for blood, 1 mL of the nerve block solution was injected without difficulty or complication. The needle was removed intact. L4 Medial Branch Nerve Block (MBB): The target area for the L4 medial branch is at the junction of the postero-lateral aspect of the superior articular process and the superior, posterior, and medial edge of the transverse process of L5. Under fluoroscopic guidance, a Quincke needle was inserted until contact was made with os over the superior postero-lateral aspect of the pedicular shadow (target area). After negative aspiration for blood, 1 mL of the nerve block solution was injected without difficulty or complication. The needle  was removed intact. L5 Medial Branch Nerve Block (MBB): The target area for the L5 medial branch is at the junction of the postero-lateral aspect of the superior articular process and the superior, posterior, and medial edge of the sacral ala. Under fluoroscopic guidance, a Quincke needle was inserted until contact was made with os over the superior postero-lateral aspect of the pedicular shadow (target area). After negative aspiration for blood, 53mL of the nerve block solution was injected without difficulty or complication. The needle was removed intact.  Procedural Needles: 22-gauge, 3.5-inch, Quincke needles used for all levels. Nerve block solution: 6 cc solution made of 5 cc of 0.2% ropivacaine, 1 cc of Decadron.  1 cc injected at each level above.  The unused portion of the solution was discarded in the proper designated containers.  Once the entire procedure was completed, the treated area was cleaned, making sure to leave some of the prepping solution back to take advantage of its long term bactericidal properties.   Illustration of the posterior view of the lumbar spine and the posterior neural structures. Laminae of L2 through S1 are labeled. DPRL5, dorsal primary ramus of L5; DPRS1, dorsal primary ramus of S1; DPR3, dorsal primary ramus of L3; FJ, facet (zygapophyseal) joint L3-L4; I, inferior articular process of L4; LB1, lateral branch of dorsal primary ramus of L1; IAB, inferior articular branches from L3 medial branch (supplies L4-L5 facet joint); IBP, intermediate branch plexus; MB3, medial branch of dorsal primary ramus of L3; NR3, third lumbar nerve root; S, superior articular process of L5; SAB, superior articular branches from L4 (supplies L4-5 facet joint also); TP3, transverse process of L3.  Vitals:   04/06/17 1209 04/06/17 1219 04/06/17 1228 04/06/17 1238  BP: (!) 149/85 134/74 137/76 (!) 142/76  Pulse:      Resp: 12 15 (!) 25 (!) 24  Temp:  98.3 F (36.8 C)    SpO2: 97%  96% 97% 96%  Weight:      Height:        Start Time: 1155 hrs. End Time: 1209  hrs.  Imaging Guidance (Spinal):  Type of Imaging Technique: Fluoroscopy Guidance (Spinal) Indication(s): Assistance in needle guidance and placement for procedures requiring needle placement in or near specific anatomical locations not easily accessible without such assistance. Exposure Time: Please see nurses notes. Contrast: None used. Fluoroscopic Guidance: I was personally present during the use of fluoroscopy. "Tunnel Vision Technique" used to obtain the best possible view of the target area. Parallax error corrected before commencing the procedure. "Direction-depth-direction" technique used to introduce the needle under continuous pulsed fluoroscopy. Once target was reached, antero-posterior, oblique, and lateral fluoroscopic projection used confirm needle placement in all planes. Images permanently stored in EMR. Interpretation: No contrast injected. I personally interpreted the imaging intraoperatively. Adequate needle placement confirmed in multiple planes. Permanent images saved into the patient's record.  Antibiotic Prophylaxis:   Anti-infectives (From admission, onward)   None     Indication(s): None identified  Post-operative Assessment:  Post-procedure Vital Signs:  Pulse Rate: 77 Temp: 98.3 F (36.8 C) Resp: (!) 24 BP: (!) 142/76 SpO2: 96 %  EBL: None  Complications: No immediate post-treatment complications observed by team, or reported by patient.  Note: The patient tolerated the entire procedure well. A repeat set of vitals were taken after the procedure and the patient was kept under observation following institutional policy, for this type of procedure. Post-procedural neurological assessment was performed, showing return to baseline, prior to discharge. The patient was provided with post-procedure discharge instructions, including a section on how to identify potential problems.  Should any problems arise concerning this procedure, the patient was given instructions to immediately contact us, at any time, without hesitation. In any case, we plan to contact the patient by telephone for a follow-up status report regarding this interventional procedure.  Comments:  No additional relevant information. 5 out of 5 strength bilateral lower extremity: Plantar flexion, dorsiflexion, knee flexion, knee extension.  Plan of Care  Patient instructed to resume her aspirin 325 mg tomorrow, 24 hours after procedure so long as her strength is at baseline.  Imaging Orders     DG C-Arm 1-60 Min-No Report Procedure Orders    No procedure(s) ordered today    Medications ordered for procedure: Meds ordered this encounter  Medications  . lactated ringers infusion 1,000 mL  . fentaNYL (SUBLIMAZE) injection 25-100 mcg    Make sure Narcan is available in the pyxis when using this medication. In the event of respiratory depression (RR< 8/min): Titrate NARCAN (naloxone) in increments of 0.1 to 0.2 mg IV at 2-3 minute intervals, until desired degree of reversal.  . lidocaine (XYLOCAINE) 1 % (with pres) injection 10 mL  . ropivacaine (PF) 2 mg/mL (0.2%) (NAROPIN) injection 10 mL  . dexamethasone (DECADRON) injection 10 mg   Medications administered: We administered lactated ringers, fentaNYL, lidocaine, ropivacaine (PF) 2 mg/mL (0.2%), and dexamethasone.  See the medical record for exact dosing, route, and time of administration.  New Prescriptions   No medications on file   Disposition: Discharge home  Discharge Date & Time: 04/06/2017; 1240 hrs.   Physician-requested Follow-up: Return in about 4 weeks (around 05/04/2017) for Post Procedure Evaluation.  Future Appointments  Date Time Provider [place]  05/04/2017  2:00 PM Gillis Santa, MD [facility] None   Primary Care Physician: Velna Hatchet, MD Location: [facility] Note by: Gillis Santa,  MD Date: 04/06/2017; Time: 2:14 PM  Disclaimer:  Medicine is not an Chief Strategy Officer. The only guarantee in medicine is that nothing is guaranteed. It is  important to note that the decision to proceed with this intervention was based on the information collected from the patient. The Data and conclusions were drawn from the patient's questionnaire, the interview, and the physical examination. Because the information was provided in large part by the patient, it cannot be guaranteed that it has not been purposely or unconsciously manipulated. Every effort has been made to obtain as much relevant data as possible for this evaluation. It is important to note that the conclusions that lead to this procedure are derived in large part from the available data. Always take into account that the treatment will also be dependent on availability of resources and existing treatment guidelines, considered by other Pain Management Practitioners as being common knowledge and practice, at the time of the intervention. For Medico-Legal purposes, it is also important to point out that variation in procedural techniques and pharmacological choices are the acceptable norm. The indications, contraindications, technique, and results of the above procedure should only be interpreted and judged by a Board-Certified Interventional Pain Specialist with extensive familiarity and expertise in the same exact procedure and technique.

## 2017-04-07 ENCOUNTER — Telehealth: Payer: Self-pay | Admitting: *Deleted

## 2017-04-07 NOTE — Telephone Encounter (Signed)
Spoke with patient re; procedure on yesterday, denies any questions or concerns.  

## 2017-05-04 ENCOUNTER — Ambulatory Visit: Payer: Medicare HMO | Admitting: Student in an Organized Health Care Education/Training Program

## 2017-05-18 ENCOUNTER — Ambulatory Visit: Payer: Medicare HMO | Admitting: Student in an Organized Health Care Education/Training Program

## 2017-05-27 DIAGNOSIS — Z6828 Body mass index (BMI) 28.0-28.9, adult: Secondary | ICD-10-CM | POA: Diagnosis not present

## 2017-05-27 DIAGNOSIS — J069 Acute upper respiratory infection, unspecified: Secondary | ICD-10-CM | POA: Diagnosis not present

## 2017-05-27 DIAGNOSIS — R05 Cough: Secondary | ICD-10-CM | POA: Diagnosis not present

## 2017-05-27 DIAGNOSIS — R220 Localized swelling, mass and lump, head: Secondary | ICD-10-CM | POA: Diagnosis not present

## 2017-05-31 DIAGNOSIS — Q385 Congenital malformations of palate, not elsewhere classified: Secondary | ICD-10-CM | POA: Diagnosis not present

## 2017-05-31 DIAGNOSIS — K219 Gastro-esophageal reflux disease without esophagitis: Secondary | ICD-10-CM | POA: Diagnosis not present

## 2017-06-16 ENCOUNTER — Ambulatory Visit
Payer: Medicare HMO | Attending: Student in an Organized Health Care Education/Training Program | Admitting: Student in an Organized Health Care Education/Training Program

## 2017-06-16 ENCOUNTER — Encounter: Payer: Self-pay | Admitting: Student in an Organized Health Care Education/Training Program

## 2017-06-16 VITALS — BP 140/83 | HR 77 | Temp 98.3°F | Resp 16 | Ht 61.0 in | Wt 167.0 lb

## 2017-06-16 DIAGNOSIS — M5136 Other intervertebral disc degeneration, lumbar region: Secondary | ICD-10-CM

## 2017-06-16 DIAGNOSIS — F419 Anxiety disorder, unspecified: Secondary | ICD-10-CM | POA: Diagnosis not present

## 2017-06-16 DIAGNOSIS — K859 Acute pancreatitis without necrosis or infection, unspecified: Secondary | ICD-10-CM | POA: Diagnosis not present

## 2017-06-16 DIAGNOSIS — Z7982 Long term (current) use of aspirin: Secondary | ICD-10-CM | POA: Diagnosis not present

## 2017-06-16 DIAGNOSIS — N281 Cyst of kidney, acquired: Secondary | ICD-10-CM | POA: Insufficient documentation

## 2017-06-16 DIAGNOSIS — M47816 Spondylosis without myelopathy or radiculopathy, lumbar region: Secondary | ICD-10-CM | POA: Insufficient documentation

## 2017-06-16 DIAGNOSIS — Z8673 Personal history of transient ischemic attack (TIA), and cerebral infarction without residual deficits: Secondary | ICD-10-CM | POA: Diagnosis not present

## 2017-06-16 DIAGNOSIS — M545 Low back pain: Secondary | ICD-10-CM | POA: Diagnosis present

## 2017-06-16 DIAGNOSIS — Z79899 Other long term (current) drug therapy: Secondary | ICD-10-CM | POA: Diagnosis not present

## 2017-06-16 DIAGNOSIS — G894 Chronic pain syndrome: Secondary | ICD-10-CM | POA: Diagnosis not present

## 2017-06-16 DIAGNOSIS — I10 Essential (primary) hypertension: Secondary | ICD-10-CM | POA: Insufficient documentation

## 2017-06-16 DIAGNOSIS — Z79891 Long term (current) use of opiate analgesic: Secondary | ICD-10-CM | POA: Diagnosis not present

## 2017-06-16 NOTE — Progress Notes (Signed)
Safety precautions to be maintained throughout the outpatient stay will include: orient to surroundings, keep bed in low position, maintain call bell within reach at all times, provide assistance with transfer out of bed and ambulation.  

## 2017-06-16 NOTE — Progress Notes (Signed)
Patient's Name: Pamela Daniel  MRN: [id number]  Referring Provider: Velna Hatchet, MD  DOB: 08-13-1950  PCP: Velna Hatchet, MD  DOS: 06/16/2017  Note by: Gillis Santa, MD  Service setting: Ambulatory outpatient  Specialty: Interventional Pain Management  Location: [facility] (AMB) Pain Management Facility    Patient type: Established   Primary Reason(s) for Visit: Encounter for post-procedure evaluation of chronic illness with mild to moderate exacerbation CC: Back Pain (lower bilateral)  HPI  Pamela Daniel is a 67 y.o. year old, female patient, who comes today for a post-procedure evaluation. She has HYPERTENSION; ESOPHAGEAL STRICTURE; G E R D; HIATAL HERNIA; RECTAL BLEEDING; DIARRHEA; COLONIC POLYPS, HX OF; SVT/ PSVT/ PAT; TIA (transient ischemic attack); VAIN I (vaginal intraepithelial neoplasia grade I); Pancreatitis; Pancreatitis, acute; Epigastric abdominal pain; Acute pancreatitis; and Cough on their problem list. Her primarily concern today is the Back Pain (lower bilateral)  Pain Assessment: Location: Lower, Left, Right Back Radiating: into hips and somewhat into the thigh area, but mainly in the hips Onset: More than a month ago Duration: Chronic pain Quality: Discomfort, Aching, Dull, Other (Comment), Constant(sometimes excruciating) Severity: 4 /10 (subjective, self-reported pain score)  Note: Reported level is compatible with observation.                         When using our objective Pain Scale, levels between 6 and 10/10 are said to belong in an emergency room, as it progressively worsens from a 6/10, described as severely limiting, requiring emergency care not usually available at an outpatient pain management facility. At a 6/10 level, communication becomes difficult and requires great effort. Assistance to reach the emergency department may be required. Facial flushing and profuse sweating along with potentially dangerous increases in heart rate and blood pressure will be  evident. Effect on ADL: when trying to work it causes the pain to really become much worse  Timing: Constant Modifying factors: stretching or extra strength tylenol BP: 140/83  HR: 77  [name] comes in today for post-procedure evaluation after the treatment done on 04/07/2017.  Further details on both, my assessment(s), as well as the proposed treatment plan, please see below.  Post-Procedure Assessment  04/06/2017 Procedure: Bilateral L3, L4, L5 facet medial branch nerve block on 04/06/2017 Pre-procedure pain score:  4/10 Post-procedure pain score: 0/10         Influential Factors: BMI: 31.55 kg/m Intra-procedural challenges: None observed.         Assessment challenges: None detected.              Reported side-effects: None.        Post-procedural adverse reactions or complications: None reported         Sedation: Please see nurses note. When no sedatives are used, the analgesic levels obtained are directly associated to the effectiveness of the local anesthetics. However, when sedation is provided, the level of analgesia obtained during the initial 1 hour following the intervention, is believed to be the result of a combination of factors. These factors may include, but are not limited to: 1. The effectiveness of the local anesthetics used. 2. The effects of the analgesic(s) and/or anxiolytic(s) used. 3. The degree of discomfort experienced by the patient at the time of the procedure. 4. The patients ability and reliability in recalling and recording the events. 5. The presence and influence of possible secondary gains and/or psychosocial factors. Reported result: Relief experienced during the 1st hour after the procedure: 100 % (  Ultra-Short Term Relief)            Interpretative annotation: Clinically appropriate result. Analgesia during this period is likely to be Local Anesthetic and/or IV Sedative (Analgesic/Anxiolytic) related.          Effects of local anesthetic: The  analgesic effects attained during this period are directly associated to the localized infiltration of local anesthetics and therefore cary significant diagnostic value as to the etiological location, or anatomical origin, of the pain. Expected duration of relief is directly dependent on the pharmacodynamics of the local anesthetic used. Long-acting (4-6 hours) anesthetics used.  Reported result: Relief during the next 4 to 6 hour after the procedure: 100 % (Short-Term Relief)            Interpretative annotation: Clinically appropriate result. Analgesia during this period is likely to be Local Anesthetic-related.          Long-term benefit: Defined as the period of time past the expected duration of local anesthetics (1 hour for short-acting and 4-6 hours for long-acting). With the possible exception of prolonged sympathetic blockade from the local anesthetics, benefits during this period are typically attributed to, or associated with, other factors such as analgesic sensory neuropraxia, antiinflammatory effects, or beneficial biochemical changes provided by agents other than the local anesthetics.  Reported result: Extended relief following procedure: 50 %(does not feel that this block worked as well or as long on the right side as compared to the first block.  ) (Long-Term Relief)            Interpretative annotation: Clinically appropriate result. Good relief. No permanent benefit expected. Inflammation plays a part in the etiology to the pain.          Current benefits: Defined as reported results that persistent at this point in time.   Analgesia: 25-50 %            Function: Somewhat improved ROM: Somewhat improved Interpretative annotation: Recurrence of symptoms. No permanent benefit expected. Effective diagnostic intervention.          Interpretation: Results would suggest a successful diagnostic intervention.                  Plan:  Proceed with Radiofrequency Ablation for the purpose of  attaining long-term benefits.                Laboratory Chemistry  Inflammation Markers (CRP: Acute Phase) (ESR: Chronic Phase) Lab Results  Component Value Date   ESRSEDRATE 10 08/01/2007                         Rheumatology Markers No results found for: RF, ANA, LABURIC, URICUR, LYMEIGGIGMAB, LYMEABIGMQN, HLAB27                      Renal Function Markers Lab Results  Component Value Date   BUN 11 12/25/2016   CREATININE 0.66 12/25/2016   GFRAA >60 12/25/2016   GFRNONAA >60 12/25/2016                              Hepatic Function Markers Lab Results  Component Value Date   AST 24 09/22/2015   ALT 15 09/22/2015   ALBUMIN 4.0 09/22/2015   ALKPHOS 88 09/22/2015   HCVAB NEGATIVE 03/15/2014   LIPASE 22 09/22/2015  Electrolytes Lab Results  Component Value Date   NA 139 12/25/2016   K 3.6 12/25/2016   CL 108 12/25/2016   CALCIUM 9.1 12/25/2016                        Neuropathy Markers Lab Results  Component Value Date   VITAMINB12 541 03/10/2012   FOLATE 11.6 08/01/2007   HGBA1C 5.6 12/22/2011                        Bone Pathology Markers No results found for: VD25OH, [id number], VX4801KP5, VZ4827MB8, 25OHVITD1, 25OHVITD2, 25OHVITD3, TESTOFREE, TESTOSTERONE                       Coagulation Parameters Lab Results  Component Value Date   INR 0.9 ratio 01/14/2010   LABPROT 9.5 (L) 01/14/2010   APTT 27.2 01/14/2010   PLT 219 12/25/2016   DDIMER  05/14/2009    <0.22        AT THE INHOUSE ESTABLISHED CUTOFF VALUE OF 0.48 ug/mL FEU, THIS ASSAY HAS BEEN DOCUMENTED IN THE LITERATURE TO HAVE A SENSITIVITY AND NEGATIVE PREDICTIVE VALUE OF AT LEAST 98 TO 99%.  THE TEST RESULT SHOULD BE CORRELATED WITH AN ASSESSMENT OF THE CLINICAL PROBABILITY OF DVT / VTE.                        Cardiovascular Markers Lab Results  Component Value Date   BNP 15.4 04/06/2015   TROPONINI <0.30 12/22/2011   HGB 15.7 12/25/2016   HCT 46.2 12/25/2016                          CA Markers No results found for: CEA, CA125, LABCA2                      Note: Lab results reviewed.  Recent Diagnostic Imaging Results  DG C-Arm 1-60 Min-No Report Fluoroscopy was utilized by the requesting physician.  No radiographic  interpretation.   Complexity Note: Imaging results reviewed. Results shared with Ms. Chastang, using Layman's terms.                         Meds   Current Outpatient Medications:  .  acetaminophen (TYLENOL) 325 MG tablet, Take 650 mg by mouth every 6 (six) hours as needed for mild pain., Disp: , Rfl:  .  albuterol (PROVENTIL) (2.5 MG/3ML) 0.083% nebulizer solution, Take 6 mLs (5 mg total) by nebulization once., Disp: 75 mL, Rfl: 12 .  ALPRAZolam (XANAX) 0.25 MG tablet, Take 0.25 mg by mouth at bedtime as needed for anxiety., Disp: , Rfl:  .  aspirin 325 MG tablet, Take 325 mg by mouth daily., Disp: , Rfl:  .  budesonide-formoterol (SYMBICORT) 160-4.5 MCG/ACT inhaler, Inhale 2 puffs into the lungs 2 (two) times daily., Disp: , Rfl:  .  fluticasone (FLONASE) 50 MCG/ACT nasal spray, Place 1 spray into both nostrils as needed. , Disp: , Rfl:  .  ibuprofen (ADVIL,MOTRIN) 200 MG tablet, Take 400 mg by mouth every 6 (six) hours., Disp: , Rfl:  .  oxyCODONE (OXY IR/ROXICODONE) 5 MG immediate release tablet, Take 1-2 tablets (5-10 mg total) by mouth every 6 (six) hours as needed., Disp: 30 tablet, Rfl: 0 .  triamcinolone cream (KENALOG) 0.1 %, Apply 1 application topically 2 (two) times  daily as needed. , Disp: , Rfl:  .  cyclobenzaprine (FLEXERIL) 10 MG tablet, Take 10 mg by mouth 3 (three) times daily as needed for muscle spasms., Disp: , Rfl:   ROS  Constitutional: Denies any fever or chills Gastrointestinal: No reported hemesis, hematochezia, vomiting, or acute GI distress Musculoskeletal: Denies any acute onset joint swelling, redness, loss of ROM, or weakness Neurological: No reported episodes of acute onset apraxia, aphasia,  dysarthria, agnosia, amnesia, paralysis, loss of coordination, or loss of consciousness  Allergies  Ms. Kinsel has No Known Allergies.  [place]  Drug: Ms. Needs  reports that she does not use drugs. Alcohol:  reports that she does not drink alcohol. Tobacco:  reports that she has never smoked. She has never used smokeless tobacco. Medical:  has a past medical history of Anxiety, Arthritis, Asthma, First degree heart block, Frequency of urination, Hemorrhoids, Hiatal hernia, History of acute pancreatitis (03/16/2015), History of adenomatous polyp of colon, History of concussion (2012), History of transient ischemic attack (TIA) (12/22/2011), History of vaginal dysplasia (2016), Hypertension, Pseudocyst of pancreas, PSVT (paroxysmal supraventricular tachycardia) ([place]), Rectal bleeding, S/P AV (atrioventricular) nodal ablation (01-21-2010    dr Caryl Comes), Simple renal cyst, and Urgency of urination. Surgical: Ms. Salahuddin  has a past surgical history that includes Cardiac electrophysiology mapping and ablation (01-21-2010   dr Caryl Comes); Foot neuroma surgery (Left, 1996); Nasal septum surgery (1991); TEE without cardioversion (02/15/2012); Vaginal hysterectomy (1996); LAPAROSCOPY TAKEDOWN AND REPAIR HIATAL HERNIA /  NISSEN FUNDOPLATION (09-01-2005    dr Hassell Done  [facility]); transthoracic echocardiogram (12-22-2011    dr Einar Gip); Hemorrhoid surgery (N/A, 10/28/2016); and Breast excisional biopsy (Right). Family: family history includes Breast cancer in her cousin and maternal aunt; Breast cancer (age of onset: 72) in her mother; COPD in her father; Cancer in her brother, brother, and mother; Cancer (age of onset: 50) in her brother; Dementia in her mother; Heart disease in her brother; Heart disease (age of onset: 46) in her mother; Heart disease (age of onset: 72) in her father; Hypertension in her mother; Mental illness in her brother.  Constitutional Exam  General appearance: Well nourished, well developed, and well  hydrated. In no apparent acute distress Vitals:   06/16/17 1223  BP: 140/83  Pulse: 77  Resp: 16  Temp: 98.3 F (36.8 C)  TempSrc: Oral  SpO2: 98%  Weight: 167 lb (75.8 kg)  Height: _0  (1.549 m)   BMI Assessment: Estimated body mass index is 31.55 kg/m as calculated from the following:   Height as of this encounter: _1  (1.549 m).   Weight as of this encounter: 167 lb (75.8 kg).  BMI interpretation table: BMI level Category Range association with higher incidence of chronic pain  <18 kg/m2 Underweight   18.5-24.9 kg/m2 Ideal body weight   25-29.9 kg/m2 Overweight Increased incidence by 20%  30-34.9 kg/m2 Obese (Class I) Increased incidence by 68%  35-39.9 kg/m2 Severe obesity (Class II) Increased incidence by 136%  >40 kg/m2 Extreme obesity (Class III) Increased incidence by 254%   Patient's current BMI Ideal Body weight  Body mass index is 31.55 kg/m. Ideal body weight: 47.8 kg (105 lb 6.1 oz) Adjusted ideal body weight: 59 kg (130 lb 0.5 oz)   BMI Readings from Last 4 Encounters:  06/16/17 31.55 kg/m  04/06/17 31.55 kg/m  03/23/17 31.55 kg/m  03/02/17 33.25 kg/m   Wt Readings from Last 4 Encounters:  06/16/17 167 lb (75.8 kg)  04/06/17 167 lb (75.8 kg)  03/23/17 167  lb (75.8 kg)  03/02/17 176 lb (79.8 kg)  Psych/Mental status: Alert, oriented x 3 (person, place, & time)       Eyes: PERLA Respiratory: No evidence of acute respiratory distress  Cervical Spine Area Exam  Skin & Axial Inspection: No masses, redness, edema, swelling, or associated skin lesions Alignment: Symmetrical Functional ROM: Unrestricted ROM      Stability: No instability detected Muscle Tone/Strength: Functionally intact. No obvious neuro-muscular anomalies detected. Sensory (Neurological): Unimpaired Palpation: No palpable anomalies              Upper Extremity (UE) Exam    Side: Right upper extremity  Side: Left upper extremity  Skin & Extremity Inspection: Skin color,  temperature, and hair growth are WNL. No peripheral edema or cyanosis. No masses, redness, swelling, asymmetry, or associated skin lesions. No contractures.  Skin & Extremity Inspection: Skin color, temperature, and hair growth are WNL. No peripheral edema or cyanosis. No masses, redness, swelling, asymmetry, or associated skin lesions. No contractures.  Functional ROM: Unrestricted ROM          Functional ROM: Unrestricted ROM          Muscle Tone/Strength: Functionally intact. No obvious neuro-muscular anomalies detected.  Muscle Tone/Strength: Functionally intact. No obvious neuro-muscular anomalies detected.  Sensory (Neurological): Unimpaired          Sensory (Neurological): Unimpaired          Palpation: No palpable anomalies              Palpation: No palpable anomalies              Provocative Test(s):  Phalen's test: deferred Tinel's test: deferred Apley's scratch test (touch opposite shoulder):  Action 1 (Across chest): deferred Action 2 (Overhead): deferred Action 3 (LB reach): deferred   Provocative Test(s):  Phalen's test: deferred Tinel's test: deferred Apley's scratch test (touch opposite shoulder):  Action 1 (Across chest): deferred Action 2 (Overhead): deferred Action 3 (LB reach): deferred    Thoracic Spine Area Exam  Skin & Axial Inspection: No masses, redness, or swelling Alignment: Symmetrical Functional ROM: Unrestricted ROM Stability: No instability detected Muscle Tone/Strength: Functionally intact. No obvious neuro-muscular anomalies detected. Sensory (Neurological): Unimpaired Muscle strength & Tone: No palpable anomalies  Lumbar Spine Area Exam  Skin & Axial Inspection: No masses, redness, or swelling Alignment: Symmetrical Functional ROM: Unrestricted ROM       Stability: No instability detected Muscle Tone/Strength: Functionally intact. No obvious neuro-muscular anomalies detected. Sensory (Neurological): Unimpaired Palpation: No palpable anomalies        Provocative Tests: Lumbar Hyperextension/rotation test: Positive bilaterally for facet joint pain.right greater than left Lumbar quadrant test (Kemp's test): (+) bilaterally for facet joint pain.right greater than left Lumbar Lateral bending test: deferred today       Patrick's Maneuver: deferred today                   FABER test: deferred today       Thigh-thrust test: deferred today       S-I compression test: deferred today       S-I distraction test: deferred today        Gait & Posture Assessment  Ambulation: Unassisted Gait: Relatively normal for age and body habitus Posture: WNL   Lower Extremity Exam    Side: Right lower extremity  Side: Left lower extremity  Stability: No instability observed          Stability: No instability observed  Skin & Extremity Inspection: Skin color, temperature, and hair growth are WNL. No peripheral edema or cyanosis. No masses, redness, swelling, asymmetry, or associated skin lesions. No contractures.  Skin & Extremity Inspection: Skin color, temperature, and hair growth are WNL. No peripheral edema or cyanosis. No masses, redness, swelling, asymmetry, or associated skin lesions. No contractures.  Functional ROM: Unrestricted ROM                  Functional ROM: Unrestricted ROM                  Muscle Tone/Strength: Functionally intact. No obvious neuro-muscular anomalies detected.  Muscle Tone/Strength: Functionally intact. No obvious neuro-muscular anomalies detected.  Sensory (Neurological): Unimpaired  Sensory (Neurological): Unimpaired  Palpation: No palpable anomalies  Palpation: No palpable anomalies   Assessment  Primary Diagnosis & Pertinent Problem List: The primary encounter diagnosis was Lumbar spondylosis. Diagnoses of Lumbar facet arthropathy, Lumbar degenerative disc disease, and Chronic pain syndrome were also pertinent to this visit.  Status Diagnosis  Persistent Persistent Persistent 1. Lumbar spondylosis   2.  Lumbar facet arthropathy   3. Lumbar degenerative disc disease   4. Chronic pain syndrome     General Recommendations: The pain condition that the patient suffers from is best treated with a multidisciplinary approach that involves an increase in physical activity to prevent de-conditioning and worsening of the pain cycle, as well as psychological counseling (formal and/or informal) to address the co-morbid psychological affects of pain. Treatment will often involve judicious use of pain medications and interventional procedures to decrease the pain, allowing the patient to participate in the physical activity that will ultimately produce long-lasting pain reductions. The goal of the multidisciplinary approach is to return the patient to a higher level of overall function and to restore their ability to perform activities of daily living.  67 year old female with a history of axial low back pain which radiates down into bilateral hips and along her posterior thighs status post diagnostic bilateral L3, L4, L5 facet medial branch nerve blocks who returns for follow-up.  Patient has had 2 series of diagnostic lumbar facet medial branch nerve blocks at L3, 4, 5 done on 03/02/2017 as well as 04/06/2017.  Patient endorses greater benefit with the first block, close to 100% for approximately 1 week after the block.  She endorses approximately 50 to 60% benefit for 3 to 4 days after her second block.  The patient is having return of her axial low back pain that is severe predominantly on her right side.  Regards to treatment plan, we discussed lumbar facet radio frequency ablation at L3, L4, L5.  Risks and benefits were discussed.  Patient would like to proceed.  Plan: -Right L3, L4, L5 lumbar facet radiofrequency ablation with moderate sedation followed by left 2 weeks after.  Patient instructed to stop aspirin 325 mg 7 days prior to her scheduled procedure.  She can take 81 mg instead.   Lab-work, procedure(s),  and/or referral(s): Orders Placed This Encounter  Procedures  . Radiofrequency,Lumbar   Provider-requested follow-up: Return in about 2 weeks (around 06/29/2017) for Procedure.  Future Appointments  Date Time Provider [place]  06/29/2017 10:15 AM Gillis Santa, MD [facility] None   Time Note: Greater than 50% of the 25 minute(s) of face-to-face time spent with Ms. Belding, was spent in counseling/coordination of care regarding: Ms. Polgar primary cause of pain, the results of her recent test(s), the treatment plan, treatment alternatives, the risks and possible complications of  proposed treatment, going over the informed consent, the results, interpretation and significance of  her recent diagnostic interventional treatment(s), realistic expectations and the goals of pain management (increased in functionality).  Primary Care Physician: Velna Hatchet, MD Location: [facility] Note by: Gillis Santa, M.D Date: 06/16/2017; Time: 1:43 PM  Patient Instructions    Stop Aspirin 325 mg 7 days prior to procedure, replace with Aspirin 81 mg    Preparing for Procedure with Sedation Instructions: . Oral Intake: Do not eat or drink anything for at least 8 hours prior to your procedure. . Transportation: Public transportation is not allowed. Bring an adult driver. The driver must be physically present in our waiting room before any procedure can be started. [place] Physical Assistance: Bring an adult capable of physically assisting you, in the event you need help. . Blood Pressure Medicine: Take your blood pressure medicine with a sip of water the morning of the procedure. . Insulin: Take only  of your normal insulin dose. . Preventing infections: Shower with an antibacterial soap the morning of your procedure. . Build-up your immune system: Take 1000 mg of Vitamin C with every meal (3 times a day) the day prior to your procedure. . Pregnancy: If you are  pregnant, call and cancel the procedure. . Sickness: If you have a cold, fever, or any active infections, call and cancel the procedure. . Arrival: You must be in the facility at least 30 minutes prior to your scheduled procedure. . Children: Do not bring children with you. . Dress appropriately: Bring dark clothing that you would not mind if they get stained. . Valuables: Do not bring any jewelry or valuables. Procedure appointments are reserved for interventional treatments only. Marland Kitchen No Prescription Refills. . No medication changes will be discussed during procedure appointments. No disability issues will be discussed.Radiofrequency Lesioning Radiofrequency lesioning is a procedure that is performed to relieve pain. The procedure is often used for back, neck, or arm pain. Radiofrequency lesioning involves the use of a machine that creates radio waves to make heat. During the procedure, the heat is applied to the nerve that carries the pain signal. The heat damages the nerve and interferes with the pain signal. Pain relief usually starts about 2 weeks after the procedure and lasts for 6 months to 1 year. Tell a health care provider about: Any allergies you have. All medicines you are taking, including vitamins, herbs, eye drops, creams, and over-the-counter medicines. Any problems you or family members have had with anesthetic medicines. Any blood disorders you have. Any surgeries you have had. Any medical conditions you have. Whether you are pregnant or may be pregnant. What are the risks? Generally, this is a safe procedure. However, problems may occur, including: Pain or soreness at the injection site. Infection at the injection site. Damage to nerves or blood vessels.  What happens before the procedure? Ask your health care provider about: Changing or stopping your regular medicines. This is especially important if you are taking diabetes medicines or blood thinners. Taking medicines  such as aspirin and ibuprofen. These medicines can thin your blood. Do not take these medicines before your procedure if your health care provider instructs you not to. Follow instructions from your health care provider about eating or drinking restrictions. Plan to have someone take you home after the procedure. If you go home right after the procedure, plan to have someone with you for 24 hours. What happens during the procedure? You will be given one  or more of the following: A medicine to help you relax (sedative). A medicine to numb the area (local anesthetic). You will be awake during the procedure. You will need to be able to talk with the health care provider during the procedure. With the help of a type of X-ray (fluoroscopy), the health care provider will insert a radiofrequency needle into the area to be treated. Next, a wire that carries the radio waves (electrode) will be put through the radiofrequency needle. An electrical pulse will be sent through the electrode to verify the correct nerve. You will feel a tingling sensation, and you may have muscle twitching. Then, the tissue that is around the needle tip will be heated by an electric current that is passed using the radiofrequency machine. This will numb the nerves. A bandage (dressing) will be put on the insertion area after the procedure is done. The procedure may vary among health care providers and hospitals. What happens after the procedure? Your blood pressure, heart rate, breathing rate, and blood oxygen level will be monitored often until the medicines you were given have worn off. Return to your normal activities as directed by your health care provider. This information is not intended to replace advice given to you by your health care provider. Make sure you discuss any questions you have with your health care provider. Document Released: 09/02/2010 Document Revised: 06/12/2015 Document Reviewed: 02/11/2014 Elsevier  Interactive Patient Education  [employer]. .

## 2017-06-16 NOTE — Patient Instructions (Signed)
Stop Aspirin 325 mg 7 days prior to procedure, replace with Aspirin 81 mg    Preparing for Procedure with Sedation Instructions: . Oral Intake: Do not eat or drink anything for at least 8 hours prior to your procedure. . Transportation: Public transportation is not allowed. Bring an adult driver. The driver must be physically present in our waiting room before any procedure can be started. Marland Kitchen Physical Assistance: Bring an adult capable of physically assisting you, in the event you need help. . Blood Pressure Medicine: Take your blood pressure medicine with a sip of water the morning of the procedure. . Insulin: Take only  of your normal insulin dose. . Preventing infections: Shower with an antibacterial soap the morning of your procedure. . Build-up your immune system: Take 1000 mg of Vitamin C with every meal (3 times a day) the day prior to your procedure. . Pregnancy: If you are pregnant, call and cancel the procedure. . Sickness: If you have a cold, fever, or any active infections, call and cancel the procedure. . Arrival: You must be in the facility at least 30 minutes prior to your scheduled procedure. . Children: Do not bring children with you. . Dress appropriately: Bring dark clothing that you would not mind if they get stained. . Valuables: Do not bring any jewelry or valuables. Procedure appointments are reserved for interventional treatments only. Marland Kitchen No Prescription Refills. . No medication changes will be discussed during procedure appointments. No disability issues will be discussed.Radiofrequency Lesioning Radiofrequency lesioning is a procedure that is performed to relieve pain. The procedure is often used for back, neck, or arm pain. Radiofrequency lesioning involves the use of a machine that creates radio waves to make heat. During the procedure, the heat is applied to the nerve that carries the pain signal. The heat damages the nerve and interferes with the pain signal.  Pain relief usually starts about 2 weeks after the procedure and lasts for 6 months to 1 year. Tell a health care provider about: Any allergies you have. All medicines you are taking, including vitamins, herbs, eye drops, creams, and over-the-counter medicines. Any problems you or family members have had with anesthetic medicines. Any blood disorders you have. Any surgeries you have had. Any medical conditions you have. Whether you are pregnant or may be pregnant. What are the risks? Generally, this is a safe procedure. However, problems may occur, including: Pain or soreness at the injection site. Infection at the injection site. Damage to nerves or blood vessels.  What happens before the procedure? Ask your health care provider about: Changing or stopping your regular medicines. This is especially important if you are taking diabetes medicines or blood thinners. Taking medicines such as aspirin and ibuprofen. These medicines can thin your blood. Do not take these medicines before your procedure if your health care provider instructs you not to. Follow instructions from your health care provider about eating or drinking restrictions. Plan to have someone take you home after the procedure. If you go home right after the procedure, plan to have someone with you for 24 hours. What happens during the procedure? You will be given one or more of the following: A medicine to help you relax (sedative). A medicine to numb the area (local anesthetic). You will be awake during the procedure. You will need to be able to talk with the health care provider during the procedure. With the help of a type of X-ray (fluoroscopy), the health care provider will insert a  radiofrequency needle into the area to be treated. Next, a wire that carries the radio waves (electrode) will be put through the radiofrequency needle. An electrical pulse will be sent through the electrode to verify the correct nerve. You  will feel a tingling sensation, and you may have muscle twitching. Then, the tissue that is around the needle tip will be heated by an electric current that is passed using the radiofrequency machine. This will numb the nerves. A bandage (dressing) will be put on the insertion area after the procedure is done. The procedure may vary among health care providers and hospitals. What happens after the procedure? Your blood pressure, heart rate, breathing rate, and blood oxygen level will be monitored often until the medicines you were given have worn off. Return to your normal activities as directed by your health care provider. This information is not intended to replace advice given to you by your health care provider. Make sure you discuss any questions you have with your health care provider. Document Released: 09/02/2010 Document Revised: 06/12/2015 Document Reviewed: 02/11/2014 Elsevier Interactive Patient Education  [employer]. .

## 2017-06-29 ENCOUNTER — Ambulatory Visit
Admission: RE | Admit: 2017-06-29 | Discharge: 2017-06-29 | Disposition: A | Discharge status: Home / Self Care | Payer: Medicare HMO | Source: Ambulatory Visit | Attending: Student in an Organized Health Care Education/Training Program | Admitting: Student in an Organized Health Care Education/Training Program

## 2017-06-29 ENCOUNTER — Other Ambulatory Visit: Payer: Self-pay

## 2017-06-29 ENCOUNTER — Encounter: Payer: Self-pay | Admitting: Student in an Organized Health Care Education/Training Program

## 2017-06-29 ENCOUNTER — Ambulatory Visit: Payer: Medicare HMO | Admitting: Student in an Organized Health Care Education/Training Program

## 2017-06-29 VITALS — BP 146/82 | HR 61 | Temp 98.3°F | Resp 17 | Ht 61.0 in | Wt 170.0 lb

## 2017-06-29 DIAGNOSIS — M549 Dorsalgia, unspecified: Secondary | ICD-10-CM | POA: Diagnosis present

## 2017-06-29 DIAGNOSIS — M47816 Spondylosis without myelopathy or radiculopathy, lumbar region: Secondary | ICD-10-CM

## 2017-06-29 MED ORDER — FENTANYL CITRATE (PF) 100 MCG/2ML IJ SOLN
INTRAMUSCULAR | Status: AC
  Filled 2017-06-29: qty 2

## 2017-06-29 MED ORDER — ROPIVACAINE HCL 2 MG/ML IJ SOLN
10.0000 mL | Freq: Once | INTRAMUSCULAR | Status: AC
  Administered 2017-06-29: 10 mL

## 2017-06-29 MED ORDER — LACTATED RINGERS IV SOLN
1000.0000 mL | Freq: Once | INTRAVENOUS | Status: AC
  Administered 2017-06-29: 1000 mL via INTRAVENOUS

## 2017-06-29 MED ORDER — ROPIVACAINE HCL 2 MG/ML IJ SOLN
INTRAMUSCULAR | Status: AC
  Filled 2017-06-29: qty 10

## 2017-06-29 MED ORDER — DEXAMETHASONE SODIUM PHOSPHATE 10 MG/ML IJ SOLN
INTRAMUSCULAR | Status: AC
  Filled 2017-06-29: qty 1

## 2017-06-29 MED ORDER — DEXAMETHASONE SODIUM PHOSPHATE 10 MG/ML IJ SOLN
10.0000 mg | Freq: Once | INTRAMUSCULAR | Status: AC
  Administered 2017-06-29: 10 mg

## 2017-06-29 MED ORDER — LIDOCAINE HCL 1 % IJ SOLN
10.0000 mL | Freq: Once | INTRAMUSCULAR | Status: AC
  Administered 2017-06-29: 10 mL
  Filled 2017-06-29: qty 10

## 2017-06-29 MED ORDER — LIDOCAINE HCL (PF) 1 % IJ SOLN
INTRAMUSCULAR | Status: AC
  Filled 2017-06-29: qty 5

## 2017-06-29 MED ORDER — FENTANYL CITRATE (PF) 100 MCG/2ML IJ SOLN
25.0000 ug | INTRAMUSCULAR | Status: DC | PRN
  Administered 2017-06-29: 75 ug via INTRAVENOUS

## 2017-06-29 NOTE — Progress Notes (Signed)
Safety precautions to be maintained throughout the outpatient stay will include: orient to surroundings, keep bed in low position, maintain call bell within reach at all times, provide assistance with transfer out of bed and ambulation.  

## 2017-06-29 NOTE — Patient Instructions (Addendum)
Post-procedure Information What to expect: Most procedures involve the use of a local anesthetic (numbing medicine), and a steroid (anti-inflammatory medicine).  The local anesthetics may cause temporary numbness and weakness of the legs or arms, depending on the location of the block. This numbness/weakness may last 4-6 hours, depending on the local anesthetic used. In rare instances, it can last up to 24 hours. While numb, you must be very careful not to injure the extremity.  After any procedure, you could expect the pain to get better within 15-20 minutes. This relief is temporary and may last 4-6 hours. Once the local anesthetics wears off, you could experience discomfort, possibly more than usual, for up to 10 (ten) days. In the case of radiofrequencies, it may last up to 6 weeks. Surgeries may take up to 8 weeks for the healing process. The discomfort is due to the irritation caused by needles going through skin and muscle. To minimize the discomfort, we recommend using ice the first day, and heat from then on. The ice should be applied for 15 minutes on, and 15 minutes off. Keep repeating this cycle until bedtime. Avoid applying the ice directly to the skin, to prevent frostbite. Heat should be used daily, until the pain improves (4-10 days). Be careful not to burn yourself.  Occasionally you may experience muscle spasms or cramps. These occur as a consequence of the irritation caused by the needle sticks to the muscle and the blood that will inevitably be lost into the surrounding muscle tissue. Blood tends to be very irritating to tissues, which tend to react by going into spasm. These spasms may start the same day of your procedure, but they may also take days to develop. This late onset type of spasm or cramp is usually caused by electrolyte imbalances triggered by the steroids, at the level of the kidney. Cramps and spasms tend to respond well to muscle relaxants, multivitamins (some are  triggered by the procedure, but may have their origins in vitamin deficiencies), and "Gatorade", or any sports drinks that can replenish any electrolyte imbalances. (If you are a diabetic, ask your pharmacist to get you a sugar-free brand.) Warm showers or baths may also be helpful. Stretching exercises are highly recommended. General Instructions:  Be alert for signs of possible infection: redness, swelling, heat, red streaks, elevated temperature, and/or fever. These typically appear 4 to 6 days after the procedure. Immediately notify your doctor if you experience unusual bleeding, difficulty breathing, or loss of bowel or bladder control. If you experience increased pain, do not increase your pain medicine intake, unless instructed by your pain physician. Post-Procedure Care:  Be careful in moving about. Muscle spasms in the area of the injection may occur. Applying ice or heat to the area is often helpful. The incidence of spinal headaches after epidural injections ranges between 1.4% and 6%. If you develop a headache that does not seem to respond to conservative therapy, please let your physician know. This can be treated with an epidural blood patch.   Post-procedure numbness or redness is to be expected, however it should average 4 to 6 hours. If numbness and weakness of your extremities begins to develop 4 to 6 hours after your procedure, and is felt to be progressing and worsening, immediately contact your physician.   Diet:  If you experience nausea, do not eat until this sensation goes away. If you had a "Stellate Ganglion Block" for upper extremity "Reflex Sympathetic Dystrophy", do not eat or drink until your   hoarseness goes away. In any case, always start with liquids first and if you tolerate them well, then slowly progress to more solid foods. Activity:  For the first 4 to 6 hours after the procedure, use caution in moving about as you may experience numbness and/or weakness. Use caution in  cooking, using household electrical appliances, and climbing steps. If you need to reach your Doctor call our office: [phone] Monday-Thursday 8:00 am - 4:00 PM    Fridays: Closed     In case of an emergency: In case of emergency, call 911 or go to the nearest emergency room and have the physician there call us.  Interpretation of Procedure Every nerve block has two components: a diagnostic component, and a treatment component. Unrealistic expectations are the most common causes of "perceived failure".  In a perfect world, a single nerve block should be able to completely and permanently eliminate the pain. Sadly, the world is not perfect.  Most pain management nerve blocks are performed using local anesthetics and steroids. Steroids are responsible for any long-term benefit that you may experience. Their purpose is to decrease any chronic swelling that may exist in the area. Steroids begin to work immediately after being injected. However, most patients will not experience any benefits until 5 to 10 days after the injection, when the swelling has come down to the point where they can tell a difference. Steroids will only help if there is swelling to be treated. As such, they can assist with the diagnosis. If effective, they suggest an inflammatory component to the pain, and if ineffective, they rule out inflammation as the main cause or component of the problem. If the problem is one of mechanical compression, you will get no benefit from those steroids.   In the case of local anesthetics, they have a crucial role in the diagnosis of your condition. Most will begin to work within15 to 20 minutes after injection. The duration will depend on the type used (short- vs. Long-acting). It is of outmost importance that patients keep tract of their pain, after the procedure. To assist with this matter, a "Post-procedure Pain Diary" is provided. Make sure to complete it and to bring it back to your  follow-up appointment.  As long as the patient keeps accurate, detailed records of their symptoms after every procedure, and returns to have those interpreted, every procedure will provide us with invaluable information. Even a block that does not provide the patient with any relief, will always provide us with information about the mechanism and the origin of the pain. The only time a nerve block can be considered a waste of time is when patients do not keep track of the results, or do not keep their post-procedure appointment.  Reporting the results back to your physician The Pain Score  Pain is a subjective complaint. It cannot be seen, touched, or measured. We depend entirely on the patient's report of the pain in order to assess your condition and treatment. To evaluate the pain, we use a pain scale, where "0" means "No Pain", and a "10" is "the worst possible pain that you can even imagine" (i.e. something like been eaten alive by a shark or being torn apart by a lion).   You will frequently be asked to rate your pain. Please be as accurate, remember that medical decisions will be based on your responses. Please do not rate your pain above a 10. Doing so is actually interpreted as "symptom magnification" (exaggeration), as   well as lack of understanding with regards to the scale. To put this into perspective, when you tell us that your pain is at a 10 (ten), what you are saying is that there is nothing we can do to make this pain any worse. (Carefully think about that.)Radiofrequency Lesioning Radiofrequency lesioning is a procedure that is performed to relieve pain. The procedure is often used for back, neck, or arm pain. Radiofrequency lesioning involves the use of a machine that creates radio waves to make heat. During the procedure, the heat is applied to the nerve that carries the pain signal. The heat damages the nerve and interferes with the pain signal. Pain relief usually starts about 2 weeks  after the procedure and lasts for 6 months to 1 year. Tell a health care provider about:  Any allergies you have.  All medicines you are taking, including vitamins, herbs, eye drops, creams, and over-the-counter medicines.  Any problems you or family members have had with anesthetic medicines.  Any blood disorders you have.  Any surgeries you have had.  Any medical conditions you have.  Whether you are pregnant or may be pregnant. What are the risks? Generally, this is a safe procedure. However, problems may occur, including:  Pain or soreness at the injection site.  Infection at the injection site.  Damage to nerves or blood vessels.  What happens before the procedure?  Ask your health care provider about: ? Changing or stopping your regular medicines. This is especially important if you are taking diabetes medicines or blood thinners. ? Taking medicines such as aspirin and ibuprofen. These medicines can thin your blood. Do not take these medicines before your procedure if your health care provider instructs you not to.  Follow instructions from your health care provider about eating or drinking restrictions.  Plan to have someone take you home after the procedure.  If you go home right after the procedure, plan to have someone with you for 24 hours. What happens during the procedure?  You will be given one or more of the following: ? A medicine to help you relax (sedative). ? A medicine to numb the area (local anesthetic).  You will be awake during the procedure. You will need to be able to talk with the health care provider during the procedure.  With the help of a type of X-ray (fluoroscopy), the health care provider will insert a radiofrequency needle into the area to be treated.  Next, a wire that carries the radio waves (electrode) will be put through the radiofrequency needle. An electrical pulse will be sent through the electrode to verify the correct nerve. You  will feel a tingling sensation, and you may have muscle twitching.  Then, the tissue that is around the needle tip will be heated by an electric current that is passed using the radiofrequency machine. This will numb the nerves.  A bandage (dressing) will be put on the insertion area after the procedure is done. The procedure may vary among health care providers and hospitals. What happens after the procedure?  Your blood pressure, heart rate, breathing rate, and blood oxygen level will be monitored often until the medicines you were given have worn off.  Return to your normal activities as directed by your health care provider. This information is not intended to replace advice given to you by your health care provider. Make sure you discuss any questions you have with your health care provider. Document Released: 09/02/2010 Document Revised: 06/12/2015 Document Reviewed:   02/11/2014 [employer] Interactive Patient Education  2018 [employer]  

## 2017-06-29 NOTE — Progress Notes (Signed)
Patient's Name: Pamela Daniel  MRN: [id number]  Referring Provider: Velna Hatchet, MD  DOB: August 26, 1950  PCP: Velna Hatchet, MD  DOS: 06/29/2017  Note by: Gillis Santa, MD  Service setting: Ambulatory outpatient  Specialty: Interventional Pain Management  Patient type: Established  Location: [facility] (AMB) Pain Management Facility  Visit type: Interventional Procedure   Primary Reason for Visit: Interventional Pain Management Treatment. CC: Back Pain  Procedure:       Anesthesia, Analgesia, Anxiolysis:  Type: Thermal Lumbar Facet, Medial Branch Radiofrequency Neurotomy Level: L3, L4, L5,  Medial Branch Level(s). These levels will denervate the L4-5, and the L5-S1 lumbar facet joints. Primary Purpose: Therapeutic Region: Posterolateral Lumbosacral Spine Laterality: Right  Type: Moderate (Conscious) Sedation combined with Local Anesthesia Indication(s): Analgesia and Anxiety Route: Intravenous (IV) IV Access: Secured Sedation: Meaningful verbal contact was maintained at all times during the procedure  Local Anesthetic: Lidocaine 1%   Indications: 1. Lumbar spondylosis    Pamela Daniel has been dealing with the above chronic pain for longer than three months and has either failed to respond, was unable to tolerate, or simply did not get enough benefit from other more conservative therapies including, but not limited to: 1. Over-the-counter medications 2. Anti-inflammatory medications 3. Muscle relaxants 4. Membrane stabilizers 5. Opioids 6. Physical therapy 7. Modalities (Heat, ice, etc.) 8. Invasive techniques such as nerve blocks. Pamela Daniel has attained more than 50% relief of the pain from a series of diagnostic injections conducted in separate occasions.  Pain Score: Pre-procedure: 5 /10 Post-procedure: 0-No pain/10  Pre-op Assessment:  [name] is a 67 y.o. (year old), female patient, seen today for interventional treatment. She  has a past surgical history that includes  Cardiac electrophysiology mapping and ablation (01-21-2010   dr Caryl Comes); Foot neuroma surgery (Left, 1996); Nasal septum surgery (1991); TEE without cardioversion (02/15/2012); Vaginal hysterectomy (1996); LAPAROSCOPY TAKEDOWN AND REPAIR HIATAL HERNIA /  NISSEN FUNDOPLATION (09-01-2005    dr Hassell Done  [facility]); transthoracic echocardiogram (12-22-2011    dr Einar Gip); Hemorrhoid surgery (N/A, 10/28/2016); and Breast excisional biopsy (Right). Pamela Daniel has a current medication list which includes the following prescription(s): acetaminophen, albuterol, alprazolam, aspirin, budesonide-formoterol, cyclobenzaprine, fluticasone, ibuprofen, oxycodone, and triamcinolone cream, and the following Facility-Administered Medications: fentanyl. Her primarily concern today is the Back Pain  Initial Vital Signs:  Pulse/HCG Rate: 74ECG Heart Rate: 62 Temp: 98.3 F (36.8 C) Resp: 16 BP: 126/80 SpO2: 98 %  BMI: Estimated body mass index is 32.12 kg/m as calculated from the following:   Height as of this encounter: 5\' 1"  (1.549 m).   Weight as of this encounter: 170 lb (77.1 kg).  Risk Assessment: Allergies: Reviewed. She has No Known Allergies.  Allergy Precautions: None required Coagulopathies: Reviewed. None identified.  Blood-thinner therapy: None at this time Active Infection(s): Reviewed. None identified. Pamela Daniel is afebrile  Site Confirmation: Pamela Daniel was asked to confirm the procedure and laterality before marking the site Procedure checklist: Completed Consent: Before the procedure and under the influence of no sedative(s), amnesic(s), or anxiolytics, the patient was informed of the treatment options, risks and possible complications. To fulfill our ethical and legal obligations, as recommended by the American Medical Association's Code of Ethics, I have informed the patient of my clinical impression; the nature and purpose of the treatment or procedure; the risks, benefits, and possible complications  of the intervention; the alternatives, including doing nothing; the risk(s) and benefit(s) of the alternative treatment(s) or procedure(s); and the risk(s) and benefit(s) of doing  nothing. The patient was provided information about the general risks and possible complications associated with the procedure. These may include, but are not limited to: failure to achieve desired goals, infection, bleeding, organ or nerve damage, allergic reactions, paralysis, and death. In addition, the patient was informed of those risks and complications associated to Spine-related procedures, such as failure to decrease pain; infection (i.e.: Meningitis, epidural or intraspinal abscess); bleeding (i.e.: epidural hematoma, subarachnoid hemorrhage, or any other type of intraspinal or peri-dural bleeding); organ or nerve damage (i.e.: Any type of peripheral nerve, nerve root, or spinal cord injury) with subsequent damage to sensory, motor, and/or autonomic systems, resulting in permanent pain, numbness, and/or weakness of one or several areas of the body; allergic reactions; (i.e.: anaphylactic reaction); and/or death. Furthermore, the patient was informed of those risks and complications associated with the medications. These include, but are not limited to: allergic reactions (i.e.: anaphylactic or anaphylactoid reaction(s)); adrenal axis suppression; blood sugar elevation that in diabetics may result in ketoacidosis or comma; water retention that in patients with history of congestive heart failure may result in shortness of breath, pulmonary edema, and decompensation with resultant heart failure; weight gain; swelling or edema; medication-induced neural toxicity; particulate matter embolism and blood vessel occlusion with resultant organ, and/or nervous system infarction; and/or aseptic necrosis of one or more joints. Finally, the patient was informed that Medicine is not an exact science; therefore, there is also the  possibility of unforeseen or unpredictable risks and/or possible complications that may result in a catastrophic outcome. The patient indicated having understood very clearly. We have given the patient no guarantees and we have made no promises. Enough time was given to the patient to ask questions, all of which were answered to the patient's satisfaction. Ms. Dicarlo has indicated that she wanted to continue with the procedure. Attestation: I, the ordering provider, attest that I have discussed with the patient the benefits, risks, side-effects, alternatives, likelihood of achieving goals, and potential problems during recovery for the procedure that I have provided informed consent. Date  Time: 06/29/2017  9:23 AM  Pre-Procedure Preparation:  Monitoring: As per clinic protocol. Respiration, ETCO2, SpO2, BP, heart rate and rhythm monitor placed and checked for adequate function Safety Precautions: Patient was assessed for positional comfort and pressure points before starting the procedure. Time-out: I initiated and conducted the "Time-out" before starting the procedure, as per protocol. The patient was asked to participate by confirming the accuracy of the "Time Out" information. Verification of the correct person, site, and procedure were performed and confirmed by me, the nursing staff, and the patient. "Time-out" conducted as per Joint Commission's Universal Protocol (UP.01.01.01). Time: 1050  Description of Procedure:       Position: Prone Laterality: Right Levels:   L3, L4, L5,  Medial Branch Level(s), at the  L4-5, and the L5-S1 lumbar facet joints. Area Prepped: Lumbosacral Prepping solution: ChloraPrep (2% chlorhexidine gluconate and 70% isopropyl alcohol) Safety Precautions: Aspiration looking for blood return was conducted prior to all injections. At no point did we inject any substances, as a needle was being advanced. Before injecting, the patient was told to immediately notify me if  she was experiencing any new onset of "ringing in the ears, or metallic taste in the mouth". No attempts were made at seeking any paresthesias. Safe injection practices and needle disposal techniques used. Medications properly checked for expiration dates. SDV (single dose vial) medications used. After the completion of the procedure, all disposable equipment used was discarded in  the proper designated medical waste containers. Local Anesthesia: Protocol guidelines were followed. The patient was positioned over the fluoroscopy table. The area was prepped in the usual manner. The time-out was completed. The target area was identified using fluoroscopy. A 12-in long, straight, sterile hemostat was used with fluoroscopic guidance to locate the targets for each level blocked. Once located, the skin was marked with an approved surgical skin marker. Once all sites were marked, the skin (epidermis, dermis, and hypodermis), as well as deeper tissues (fat, connective tissue and muscle) were infiltrated with a small amount of a short-acting local anesthetic, loaded on a 10cc syringe with a 25G, 1.5-in  Needle. An appropriate amount of time was allowed for local anesthetics to take effect before proceeding to the next step. Local Anesthetic: Lidocaine 1.0% The unused portion of the local anesthetic was discarded in the proper designated containers. Technical explanation of process:  Radiofrequency Ablation (RFA)  L3 Medial Branch Nerve RFA: The target area for the L3 medial branch is at the junction of the postero-lateral aspect of the superior articular process and the superior, posterior, and medial edge of the transverse process of L4. Under fluoroscopic guidance, a Radiofrequency needle was inserted until contact was made with os over the superior postero-lateral aspect of the pedicular shadow (target area). Sensory and motor testing was conducted to properly adjust the position of the needle. Once satisfactory  placement of the needle was achieved, the numbing solution was slowly injected after negative aspiration for blood. 1 mL of the nerve block solution was injected without difficulty or complication. After waiting for at least 3 minutes, the ablation was performed. Once completed, the needle was removed intact. L4 Medial Branch Nerve RFA: The target area for the L4 medial branch is at the junction of the postero-lateral aspect of the superior articular process and the superior, posterior, and medial edge of the transverse process of L5. Under fluoroscopic guidance, a Radiofrequency needle was inserted until contact was made with os over the superior postero-lateral aspect of the pedicular shadow (target area). Sensory and motor testing was conducted to properly adjust the position of the needle. Once satisfactory placement of the needle was achieved, the numbing solution was slowly injected after negative aspiration for blood. 74mL of the nerve block solution was injected without difficulty or complication. After waiting for at least 3 minutes, the ablation was performed. Once completed, the needle was removed intact. L5 Medial Branch Nerve RFA: The target area for the L5 medial branch is at the junction of the postero-lateral aspect of the superior articular process of S1 and the superior, posterior, and medial edge of the sacral ala. Under fluoroscopic guidance, a Radiofrequency needle was inserted until contact was made with os over the superior postero-lateral aspect of the pedicular shadow (target area). Sensory and motor testing was conducted to properly adjust the position of the needle. Once satisfactory placement of the needle was achieved, the numbing solution was slowly injected after negative aspiration for blood. 34mL of the nerve block solution was injected without difficulty or complication. After waiting for at least 3 minutes, the ablation was performed. Once completed, the needle was removed  intact.  Radiofrequency lesioning (ablation):  Radiofrequency Generator: NeuroTherm [id number] Sensory Stimulation Parameters: 50 Hz was used to locate & identify the nerve, making sure that the needle was positioned such that there was no sensory stimulation below 0.3 V or above 0.7 V. Motor Stimulation Parameters: 2 Hz was used to evaluate the motor component. Care was  taken not to lesion any nerves that demonstrated motor stimulation of the lower extremities at an output of less than 2.5 times that of the sensory threshold, or a maximum of 2.0 V. Lesioning Technique Parameters: Standard Radiofrequency settings. (Not bipolar or pulsed.) Temperature Settings: 80 degrees C Lesioning time: 60 seconds Intra-operative Compliance: Compliant Materials & Medications: Needle(s) (Electrode/Cannula) Type: Teflon-coated, curved tip, Radiofrequency needle(s) Gauge: 22G Length: 10cm Numbing solution: 4 cc solution with 3 cc of 0.2% ropivacaine, 1 cc of Decadron 10 mg/cc.  1 cc injected at each level above.  The unused portion of the solution was discarded in the proper designated containers.  Once the entire procedure was completed, the treated area was cleaned, making sure to leave some of the prepping solution back to take advantage of its long term bactericidal properties.  Illustration of the posterior view of the lumbar spine and the posterior neural structures. Laminae of L2 through S1 are labeled. DPRL5, dorsal primary ramus of L5; DPRS1, dorsal primary ramus of S1; DPR3, dorsal primary ramus of L3; FJ, facet (zygapophyseal) joint L3-L4; I, inferior articular process of L4; LB1, lateral branch of dorsal primary ramus of L1; IAB, inferior articular branches from L3 medial branch (supplies L4-L5 facet joint); IBP, intermediate branch plexus; MB3, medial branch of dorsal primary ramus of L3; NR3, third lumbar nerve root; S, superior articular process of L5; SAB, superior articular branches from L4 (supplies  L4-5 facet joint also); TP3, transverse process of L3.  Vitals:   06/29/17 1105 06/29/17 1114 06/29/17 1124 06/29/17 1136  BP: (!) 149/78 127/90 (!) 141/72 (!) 146/82  Pulse: 61     Resp: 12 15 18 17   Temp:      TempSrc:      SpO2: 95% 98% 98% 99%  Weight:      Height:        Start Time: 1050 hrs. End Time: 1104 hrs.  Imaging Guidance (Spinal):  Type of Imaging Technique: Fluoroscopy Guidance (Spinal) Indication(s): Assistance in needle guidance and placement for procedures requiring needle placement in or near specific anatomical locations not easily accessible without such assistance. Exposure Time: Please see nurses notes. Contrast: None used. Fluoroscopic Guidance: I was personally present during the use of fluoroscopy. "Tunnel Vision Technique" used to obtain the best possible view of the target area. Parallax error corrected before commencing the procedure. "Direction-depth-direction" technique used to introduce the needle under continuous pulsed fluoroscopy. Once target was reached, antero-posterior, oblique, and lateral fluoroscopic projection used confirm needle placement in all planes. Images permanently stored in EMR. Interpretation: No contrast injected. I personally interpreted the imaging intraoperatively. Adequate needle placement confirmed in multiple planes. Permanent images saved into the patient's record.  Antibiotic Prophylaxis:   Anti-infectives (From admission, onward)   None     Indication(s): None identified  Post-operative Assessment:  Post-procedure Vital Signs:  Pulse/HCG Rate: 6162 Temp: 98.3 F (36.8 C) Resp: 17 BP: (!) 146/82 SpO2: 99 %  EBL: None  Complications: No immediate post-treatment complications observed by team, or reported by patient.  Note: The patient tolerated the entire procedure well. A repeat set of vitals were taken after the procedure and the patient was kept under observation following institutional policy, for this type  of procedure. Post-procedural neurological assessment was performed, showing return to baseline, prior to discharge. The patient was provided with post-procedure discharge instructions, including a section on how to identify potential problems. Should any problems arise concerning this procedure, the patient was given instructions to immediately contact us,  at any time, without hesitation. In any case, we plan to contact the patient by telephone for a follow-up status report regarding this interventional procedure.  Comments:  No additional relevant information.  Plan of Care   Imaging Orders     DG C-Arm 1-60 Min-No Report  Procedure Orders     Radiofrequency,Lumbar  Medications ordered for procedure: Meds ordered this encounter  Medications  . lactated ringers infusion 1,000 mL  . fentaNYL (SUBLIMAZE) injection 25-100 mcg    Make sure Narcan is available in the pyxis when using this medication. In the event of respiratory depression (RR< 8/min): Titrate NARCAN (naloxone) in increments of 0.1 to 0.2 mg IV at 2-3 minute intervals, until desired degree of reversal.  . lidocaine (XYLOCAINE) 1 % (with pres) injection 10 mL  . ropivacaine (PF) 2 mg/mL (0.2%) (NAROPIN) injection 10 mL  . dexamethasone (DECADRON) injection 10 mg   Medications administered: We administered lactated ringers, fentaNYL, lidocaine, ropivacaine (PF) 2 mg/mL (0.2%), and dexamethasone.  See the medical record for exact dosing, route, and time of administration.  New Prescriptions   No medications on file   Disposition: Discharge home  Discharge Date & Time: 06/29/2017; 1140 hrs.   Physician-requested Follow-up: Return in about 1 month (around 07/27/2017) for Contra-lateral RFA.  No future appointments. Primary Care Physician: Velna Hatchet, MD Location: [facility] Note by: Gillis Santa, MD Date: 06/29/2017; Time: 3:05 PM  Disclaimer:  Medicine is not an exact science. The only  guarantee in medicine is that nothing is guaranteed. It is important to note that the decision to proceed with this intervention was based on the information collected from the patient. The Data and conclusions were drawn from the patient's questionnaire, the interview, and the physical examination. Because the information was provided in large part by the patient, it cannot be guaranteed that it has not been purposely or unconsciously manipulated. Every effort has been made to obtain as much relevant data as possible for this evaluation. It is important to note that the conclusions that lead to this procedure are derived in large part from the available data. Always take into account that the treatment will also be dependent on availability of resources and existing treatment guidelines, considered by other Pain Management Practitioners as being common knowledge and practice, at the time of the intervention. For Medico-Legal purposes, it is also important to point out that variation in procedural techniques and pharmacological choices are the acceptable norm. The indications, contraindications, technique, and results of the above procedure should only be interpreted and judged by a Board-Certified Interventional Pain Specialist with extensive familiarity and expertise in the same exact procedure and technique.

## 2017-06-30 ENCOUNTER — Telehealth: Payer: Self-pay | Admitting: *Deleted

## 2017-06-30 NOTE — Telephone Encounter (Signed)
No problems post procedure. 

## 2017-08-03 ENCOUNTER — Encounter: Payer: Self-pay | Admitting: Student in an Organized Health Care Education/Training Program

## 2017-08-03 ENCOUNTER — Ambulatory Visit
Admission: RE | Admit: 2017-08-03 | Discharge: 2017-08-03 | Disposition: A | Discharge status: Home / Self Care | Payer: Medicare HMO | Source: Ambulatory Visit | Attending: Student in an Organized Health Care Education/Training Program | Admitting: Student in an Organized Health Care Education/Training Program

## 2017-08-03 ENCOUNTER — Other Ambulatory Visit: Payer: Self-pay

## 2017-08-03 ENCOUNTER — Ambulatory Visit (HOSPITAL_BASED_OUTPATIENT_CLINIC_OR_DEPARTMENT_OTHER): Payer: Medicare HMO | Admitting: Student in an Organized Health Care Education/Training Program

## 2017-08-03 VITALS — BP 134/82 | HR 69 | Temp 97.2°F | Resp 14 | Ht 61.0 in | Wt 167.0 lb

## 2017-08-03 DIAGNOSIS — M47816 Spondylosis without myelopathy or radiculopathy, lumbar region: Secondary | ICD-10-CM | POA: Insufficient documentation

## 2017-08-03 DIAGNOSIS — M545 Low back pain: Secondary | ICD-10-CM | POA: Diagnosis not present

## 2017-08-03 DIAGNOSIS — G8929 Other chronic pain: Secondary | ICD-10-CM | POA: Insufficient documentation

## 2017-08-03 DIAGNOSIS — Z9889 Other specified postprocedural states: Secondary | ICD-10-CM | POA: Insufficient documentation

## 2017-08-03 DIAGNOSIS — Z90710 Acquired absence of both cervix and uterus: Secondary | ICD-10-CM | POA: Insufficient documentation

## 2017-08-03 MED ORDER — LIDOCAINE HCL 1 % IJ SOLN
10.0000 mL | Freq: Once | INTRAMUSCULAR | Status: AC
  Administered 2017-08-03: 5 mL
  Filled 2017-08-03: qty 10

## 2017-08-03 MED ORDER — DEXAMETHASONE SODIUM PHOSPHATE 10 MG/ML IJ SOLN
10.0000 mg | Freq: Once | INTRAMUSCULAR | Status: AC
  Administered 2017-08-03: 20 mg
  Filled 2017-08-03: qty 1

## 2017-08-03 MED ORDER — ROPIVACAINE HCL 2 MG/ML IJ SOLN
10.0000 mL | Freq: Once | INTRAMUSCULAR | Status: AC
  Administered 2017-08-03: 10 mL
  Filled 2017-08-03: qty 10

## 2017-08-03 MED ORDER — FENTANYL CITRATE (PF) 100 MCG/2ML IJ SOLN
25.0000 ug | INTRAMUSCULAR | Status: DC | PRN
  Administered 2017-08-03: 75 ug via INTRAVENOUS
  Filled 2017-08-03: qty 2

## 2017-08-03 MED ORDER — LACTATED RINGERS IV SOLN
1000.0000 mL | Freq: Once | INTRAVENOUS | Status: AC
  Administered 2017-08-03: 1000 mL via INTRAVENOUS

## 2017-08-03 NOTE — Progress Notes (Signed)
Patient's Name: Pamela Daniel  MRN: [id number]  Referring Provider: Gillis Santa, MD  DOB: 09-Jun-1950  PCP: Velna Hatchet, MD  DOS: 08/03/2017  Note by: Gillis Santa, MD  Service setting: Ambulatory outpatient  Specialty: Interventional Pain Management  Patient type: Established  Location: [facility] (AMB) Pain Management Facility  Visit type: Interventional Procedure   Primary Reason for Visit: Interventional Pain Management Treatment. CC: Back Pain (lower)  Procedure:       Anesthesia, Analgesia, Anxiolysis:  Type: Thermal Lumbar Facet, Medial Branch Radiofrequency Neurotomy Level: L3, L4, L5,  Medial Branch Level(s). These levels will denervate the L4-5, and the L5-S1 lumbar facet joints. Primary Purpose: Therapeutic Region: Posterolateral Lumbosacral Spine Laterality: Left  Type: Moderate (Conscious) Sedation combined with Local Anesthesia Indication(s): Analgesia and Anxiety Route: Intravenous (IV) IV Access: Secured Sedation: Meaningful verbal contact was maintained at all times during the procedure  Local Anesthetic: Lidocaine 1%   Indications: 1. Lumbar spondylosis    Pamela Daniel has been dealing with the above chronic pain for longer than three months and has either failed to respond, was unable to tolerate, or simply did not get enough benefit from other more conservative therapies including, but not limited to: 1. Over-the-counter medications 2. Anti-inflammatory medications 3. Muscle relaxants 4. Membrane stabilizers 5. Opioids 6. Physical therapy 7. Modalities (Heat, ice, etc.) 8. Invasive techniques such as nerve blocks. Pamela Daniel has attained more than 50% relief of the pain from a series of diagnostic injections conducted in separate occasions.  Pain Score: Pre-procedure: 2 /10 Post-procedure: 0-No pain/10  Pre-op Assessment:  Pamela Daniel is a 67 y.o. (year old), female patient, seen today for interventional treatment. She  has a past surgical history that  includes Cardiac electrophysiology mapping and ablation (01-21-2010   dr Caryl Comes); Foot neuroma surgery (Left, 1996); Nasal septum surgery (1991); TEE without cardioversion (02/15/2012); Vaginal hysterectomy (1996); LAPAROSCOPY TAKEDOWN AND REPAIR HIATAL HERNIA /  NISSEN FUNDOPLATION (09-01-2005    dr Hassell Done  [facility]); transthoracic echocardiogram (12-22-2011    dr Einar Gip); Hemorrhoid surgery (N/A, 10/28/2016); and Breast excisional biopsy (Right). Pamela Daniel has a current medication list which includes the following prescription(s): acetaminophen, albuterol, alprazolam, aspirin, budesonide-formoterol, cyclobenzaprine, fluticasone, ibuprofen, oxycodone, and triamcinolone cream, and the following Facility-Administered Medications: fentanyl. Her primarily concern today is the Back Pain (lower)  Initial Vital Signs:  Pulse/HCG Rate: 69ECG Heart Rate: 68 Temp: 97.8 F (36.6 C) Resp: 16 BP: (!) 131/91 SpO2: 100 %  BMI: Estimated body mass index is 31.55 kg/m as calculated from the following:   Height as of this encounter: 5\' 1"  (1.549 m).   Weight as of this encounter: 167 lb (75.8 kg).  Risk Assessment: Allergies: Reviewed. She has No Known Allergies.  Allergy Precautions: None required Coagulopathies: Reviewed. None identified.  Blood-thinner therapy: None at this time Active Infection(s): Reviewed. None identified. Pamela Daniel is afebrile  Site Confirmation: Pamela Daniel was asked to confirm the procedure and laterality before marking the site Procedure checklist: Completed Consent: Before the procedure and under the influence of no sedative(s), amnesic(s), or anxiolytics, the patient was informed of the treatment options, risks and possible complications. To fulfill our ethical and legal obligations, as recommended by the American Medical Association's Code of Ethics, I have informed the patient of my clinical impression; the nature and purpose of the treatment or procedure; the risks, benefits, and  possible complications of the intervention; the alternatives, including doing nothing; the risk(s) and benefit(s) of the alternative treatment(s) or procedure(s); and the risk(s) and  benefit(s) of doing nothing. The patient was provided information about the general risks and possible complications associated with the procedure. These may include, but are not limited to: failure to achieve desired goals, infection, bleeding, organ or nerve damage, allergic reactions, paralysis, and death. In addition, the patient was informed of those risks and complications associated to Spine-related procedures, such as failure to decrease pain; infection (i.e.: Meningitis, epidural or intraspinal abscess); bleeding (i.e.: epidural hematoma, subarachnoid hemorrhage, or any other type of intraspinal or peri-dural bleeding); organ or nerve damage (i.e.: Any type of peripheral nerve, nerve root, or spinal cord injury) with subsequent damage to sensory, motor, and/or autonomic systems, resulting in permanent pain, numbness, and/or weakness of one or several areas of the body; allergic reactions; (i.e.: anaphylactic reaction); and/or death. Furthermore, the patient was informed of those risks and complications associated with the medications. These include, but are not limited to: allergic reactions (i.e.: anaphylactic or anaphylactoid reaction(s)); adrenal axis suppression; blood sugar elevation that in diabetics may result in ketoacidosis or comma; water retention that in patients with history of congestive heart failure may result in shortness of breath, pulmonary edema, and decompensation with resultant heart failure; weight gain; swelling or edema; medication-induced neural toxicity; particulate matter embolism and blood vessel occlusion with resultant organ, and/or nervous system infarction; and/or aseptic necrosis of one or more joints. Finally, the patient was informed that Medicine is not an exact science; therefore, there  is also the possibility of unforeseen or unpredictable risks and/or possible complications that may result in a catastrophic outcome. The patient indicated having understood very clearly. We have given the patient no guarantees and we have made no promises. Enough time was given to the patient to ask questions, all of which were answered to the patient's satisfaction. Ms. Sherry has indicated that she wanted to continue with the procedure. Attestation: I, the ordering provider, attest that I have discussed with the patient the benefits, risks, side-effects, alternatives, likelihood of achieving goals, and potential problems during recovery for the procedure that I have provided informed consent. Date  Time: 08/03/2017  7:41 AM  Pre-Procedure Preparation:  Monitoring: As per clinic protocol. Respiration, ETCO2, SpO2, BP, heart rate and rhythm monitor placed and checked for adequate function Safety Precautions: Patient was assessed for positional comfort and pressure points before starting the procedure. Time-out: I initiated and conducted the "Time-out" before starting the procedure, as per protocol. The patient was asked to participate by confirming the accuracy of the "Time Out" information. Verification of the correct person, site, and procedure were performed and confirmed by me, the nursing staff, and the patient. "Time-out" conducted as per Joint Commission's Universal Protocol (UP.01.01.01). Time: 0818  Description of Procedure:       Position: Prone Laterality: Left Levels:   L3, L4, L5,  Medial Branch Level(s), at the  L4-5, and the L5-S1 lumbar facet joints. Area Prepped: Lumbosacral Prepping solution: ChloraPrep (2% chlorhexidine gluconate and 70% isopropyl alcohol) Safety Precautions: Aspiration looking for blood return was conducted prior to all injections. At no point did we inject any substances, as a needle was being advanced. Before injecting, the patient was told to immediately  notify me if she was experiencing any new onset of "ringing in the ears, or metallic taste in the mouth". No attempts were made at seeking any paresthesias. Safe injection practices and needle disposal techniques used. Medications properly checked for expiration dates. SDV (single dose vial) medications used. After the completion of the procedure, all disposable equipment used  was discarded in the proper designated medical waste containers. Local Anesthesia: Protocol guidelines were followed. The patient was positioned over the fluoroscopy table. The area was prepped in the usual manner. The time-out was completed. The target area was identified using fluoroscopy. A 12-in long, straight, sterile hemostat was used with fluoroscopic guidance to locate the targets for each level blocked. Once located, the skin was marked with an approved surgical skin marker. Once all sites were marked, the skin (epidermis, dermis, and hypodermis), as well as deeper tissues (fat, connective tissue and muscle) were infiltrated with a small amount of a short-acting local anesthetic, loaded on a 10cc syringe with a 25G, 1.5-in  Needle. An appropriate amount of time was allowed for local anesthetics to take effect before proceeding to the next step. Local Anesthetic: Lidocaine 1.0% The unused portion of the local anesthetic was discarded in the proper designated containers. Technical explanation of process:  Radiofrequency Ablation (RFA)  L3 Medial Branch Nerve RFA: The target area for the L3 medial branch is at the junction of the postero-lateral aspect of the superior articular process and the superior, posterior, and medial edge of the transverse process of L4. Under fluoroscopic guidance, a Radiofrequency needle was inserted until contact was made with os over the superior postero-lateral aspect of the pedicular shadow (target area). Sensory and motor testing was conducted to properly adjust the position of the needle. Once  satisfactory placement of the needle was achieved, the numbing solution was slowly injected after negative aspiration for blood. 1 mL of the nerve block solution was injected without difficulty or complication. After waiting for at least 3 minutes, the ablation was performed. Once completed, the needle was removed intact. L4 Medial Branch Nerve RFA: The target area for the L4 medial branch is at the junction of the postero-lateral aspect of the superior articular process and the superior, posterior, and medial edge of the transverse process of L5. Under fluoroscopic guidance, a Radiofrequency needle was inserted until contact was made with os over the superior postero-lateral aspect of the pedicular shadow (target area). Sensory and motor testing was conducted to properly adjust the position of the needle. Once satisfactory placement of the needle was achieved, the numbing solution was slowly injected after negative aspiration for blood. 3mL of the nerve block solution was injected without difficulty or complication. After waiting for at least 3 minutes, the ablation was performed. Once completed, the needle was removed intact. L5 Medial Branch Nerve RFA: The target area for the L5 medial branch is at the junction of the postero-lateral aspect of the superior articular process of S1 and the superior, posterior, and medial edge of the sacral ala. Under fluoroscopic guidance, a Radiofrequency needle was inserted until contact was made with os over the superior postero-lateral aspect of the pedicular shadow (target area). Sensory and motor testing was conducted to properly adjust the position of the needle. Once satisfactory placement of the needle was achieved, the numbing solution was slowly injected after negative aspiration for blood. 60mL of the nerve block solution was injected without difficulty or complication. After waiting for at least 3 minutes, the ablation was performed. Once completed, the needle was  removed intact.  Radiofrequency lesioning (ablation):  Radiofrequency Generator: NeuroTherm [id number] Sensory Stimulation Parameters: 50 Hz was used to locate & identify the nerve, making sure that the needle was positioned such that there was no sensory stimulation below 0.3 V or above 0.7 V. Motor Stimulation Parameters: 2 Hz was used to evaluate the motor  component. Care was taken not to lesion any nerves that demonstrated motor stimulation of the lower extremities at an output of less than 2.5 times that of the sensory threshold, or a maximum of 2.0 V. Lesioning Technique Parameters: Standard Radiofrequency settings. (Not bipolar or pulsed.) Temperature Settings: 80 degrees C Lesioning time: 60 seconds Intra-operative Compliance: Compliant Materials & Medications: Needle(s) (Electrode/Cannula) Type: Teflon-coated, curved tip, Radiofrequency needle(s) Gauge: 22G Length: 10cm Numbing solution: 4 cc solution with 3 cc of 0.2% ropivacaine, 1 cc of Decadron 10 mg/cc.  1 cc injected at each level above.  The unused portion of the solution was discarded in the proper designated containers.  Once the entire procedure was completed, the treated area was cleaned, making sure to leave some of the prepping solution back to take advantage of its long term bactericidal properties.  Illustration of the posterior view of the lumbar spine and the posterior neural structures. Laminae of L2 through S1 are labeled. DPRL5, dorsal primary ramus of L5; DPRS1, dorsal primary ramus of S1; DPR3, dorsal primary ramus of L3; FJ, facet (zygapophyseal) joint L3-L4; I, inferior articular process of L4; LB1, lateral branch of dorsal primary ramus of L1; IAB, inferior articular branches from L3 medial branch (supplies L4-L5 facet joint); IBP, intermediate branch plexus; MB3, medial branch of dorsal primary ramus of L3; NR3, third lumbar nerve root; S, superior articular process of L5; SAB, superior articular branches from L4  (supplies L4-5 facet joint also); TP3, transverse process of L3.  Vitals:   08/03/17 0842 08/03/17 0852 08/03/17 0903 08/03/17 0914  BP: (!) 147/109 (!) 141/74 (!) 151/93 134/82  Pulse:      Resp: 17 17 13 14   Temp: (!) 97.2 F (36.2 C)     SpO2: 95% 97% 97% 98%  Weight:      Height:        Start Time: 0818 hrs. End Time: 0842 hrs.  Imaging Guidance (Spinal):  Type of Imaging Technique: Fluoroscopy Guidance (Spinal) Indication(s): Assistance in needle guidance and placement for procedures requiring needle placement in or near specific anatomical locations not easily accessible without such assistance. Exposure Time: Please see nurses notes. Contrast: None used. Fluoroscopic Guidance: I was personally present during the use of fluoroscopy. "Tunnel Vision Technique" used to obtain the best possible view of the target area. Parallax error corrected before commencing the procedure. "Direction-depth-direction" technique used to introduce the needle under continuous pulsed fluoroscopy. Once target was reached, antero-posterior, oblique, and lateral fluoroscopic projection used confirm needle placement in all planes. Images permanently stored in EMR. Interpretation: No contrast injected. I personally interpreted the imaging intraoperatively. Adequate needle placement confirmed in multiple planes. Permanent images saved into the patient's record.  Antibiotic Prophylaxis:   Anti-infectives (From admission, onward)   None     Indication(s): None identified  Post-operative Assessment:  Post-procedure Vital Signs:  Pulse/HCG Rate: 6967 Temp: (!) 97.2 F (36.2 C) Resp: 14 BP: 134/82 SpO2: 98 %  EBL: None  Complications: No immediate post-treatment complications observed by team, or reported by patient.  Note: The patient tolerated the entire procedure well. A repeat set of vitals were taken after the procedure and the patient was kept under observation following institutional policy,  for this type of procedure. Post-procedural neurological assessment was performed, showing return to baseline, prior to discharge. The patient was provided with post-procedure discharge instructions, including a section on how to identify potential problems. Should any problems arise concerning this procedure, the patient was given instructions to immediately contact  us, at any time, without hesitation. In any case, we plan to contact the patient by telephone for a follow-up status report regarding this interventional procedure.  Comments:  No additional relevant information. 5 out of 5 strength bilateral lower extremity: Plantar flexion, dorsiflexion, knee flexion, knee extension.  Plan of Care    Imaging Orders     DG C-Arm 1-60 Min-No Report Procedure Orders    No procedure(s) ordered today    Medications ordered for procedure: Meds ordered this encounter  Medications  . lactated ringers infusion 1,000 mL  . fentaNYL (SUBLIMAZE) injection 25-100 mcg    Make sure Narcan is available in the pyxis when using this medication. In the event of respiratory depression (RR< 8/min): Titrate NARCAN (naloxone) in increments of 0.1 to 0.2 mg IV at 2-3 minute intervals, until desired degree of reversal.  . lidocaine (XYLOCAINE) 1 % (with pres) injection 10 mL  . ropivacaine (PF) 2 mg/mL (0.2%) (NAROPIN) injection 10 mL  . dexamethasone (DECADRON) injection 10 mg   Medications administered: We administered lactated ringers, fentaNYL, lidocaine, ropivacaine (PF) 2 mg/mL (0.2%), and dexamethasone.  See the medical record for exact dosing, route, and time of administration.  New Prescriptions   No medications on file   Disposition: Discharge home  Discharge Date & Time: 08/03/2017; 0915 hrs.   Physician-requested Follow-up: Return in about 1 month (around 08/31/2017) for Post Procedure Evaluation.  Future Appointments  Date Time Provider [place]  08/31/2017 12:30 PM Gillis Santa, MD  [facility] None   Primary Care Physician: Velna Hatchet, MD Location: [facility] Note by: Gillis Santa, MD Date: 08/03/2017; Time: 12:47 PM  Disclaimer:  Medicine is not an exact science. The only guarantee in medicine is that nothing is guaranteed. It is important to note that the decision to proceed with this intervention was based on the information collected from the patient. The Data and conclusions were drawn from the patient's questionnaire, the interview, and the physical examination. Because the information was provided in large part by the patient, it cannot be guaranteed that it has not been purposely or unconsciously manipulated. Every effort has been made to obtain as much relevant data as possible for this evaluation. It is important to note that the conclusions that lead to this procedure are derived in large part from the available data. Always take into account that the treatment will also be dependent on availability of resources and existing treatment guidelines, considered by other Pain Management Practitioners as being common knowledge and practice, at the time of the intervention. For Medico-Legal purposes, it is also important to point out that variation in procedural techniques and pharmacological choices are the acceptable norm. The indications, contraindications, technique, and results of the above procedure should only be interpreted and judged by a Board-Certified Interventional Pain Specialist with extensive familiarity and expertise in the same exact procedure and technique.

## 2017-08-03 NOTE — Progress Notes (Signed)
Safety precautions to be maintained throughout the outpatient stay will include: orient to surroundings, keep bed in low position, maintain call bell within reach at all times, provide assistance with transfer out of bed and ambulation.  

## 2017-08-03 NOTE — Patient Instructions (Signed)

## 2017-08-04 ENCOUNTER — Telehealth: Payer: Self-pay | Admitting: *Deleted

## 2017-08-04 NOTE — Telephone Encounter (Signed)
Attempted to call for post procedure follow-up, no answer.

## 2017-08-31 ENCOUNTER — Ambulatory Visit: Payer: Medicare HMO | Admitting: Student in an Organized Health Care Education/Training Program

## 2017-09-08 ENCOUNTER — Ambulatory Visit: Payer: Medicare HMO | Admitting: Student in an Organized Health Care Education/Training Program

## 2017-10-03 DIAGNOSIS — J45909 Unspecified asthma, uncomplicated: Secondary | ICD-10-CM | POA: Diagnosis not present

## 2017-10-03 DIAGNOSIS — H6691 Otitis media, unspecified, right ear: Secondary | ICD-10-CM | POA: Diagnosis not present

## 2017-10-03 DIAGNOSIS — J019 Acute sinusitis, unspecified: Secondary | ICD-10-CM | POA: Diagnosis not present

## 2017-10-09 ENCOUNTER — Emergency Department: Payer: Medicare HMO

## 2017-10-09 ENCOUNTER — Emergency Department
Admission: EM | Admit: 2017-10-09 | Discharge: 2017-10-09 | Disposition: A | Discharge status: Home / Self Care | Payer: Medicare HMO | Attending: Emergency Medicine | Admitting: Emergency Medicine

## 2017-10-09 ENCOUNTER — Encounter: Payer: Self-pay | Admitting: Emergency Medicine

## 2017-10-09 ENCOUNTER — Other Ambulatory Visit: Payer: Self-pay

## 2017-10-09 DIAGNOSIS — R0602 Shortness of breath: Secondary | ICD-10-CM | POA: Diagnosis not present

## 2017-10-09 DIAGNOSIS — Z7982 Long term (current) use of aspirin: Secondary | ICD-10-CM | POA: Insufficient documentation

## 2017-10-09 DIAGNOSIS — I10 Essential (primary) hypertension: Secondary | ICD-10-CM | POA: Diagnosis not present

## 2017-10-09 DIAGNOSIS — R0781 Pleurodynia: Secondary | ICD-10-CM | POA: Diagnosis not present

## 2017-10-09 DIAGNOSIS — J4541 Moderate persistent asthma with (acute) exacerbation: Secondary | ICD-10-CM | POA: Insufficient documentation

## 2017-10-09 LAB — COMPREHENSIVE METABOLIC PANEL
ALK PHOS: 71 U/L (ref 38–126)
ALT: 32 U/L (ref 0–44)
AST: 32 U/L (ref 15–41)
Albumin: 3.9 g/dL (ref 3.5–5.0)
Anion gap: 7 (ref 5–15)
BILIRUBIN TOTAL: 0.4 mg/dL (ref 0.3–1.2)
BUN: 19 mg/dL (ref 8–23)
CALCIUM: 9 mg/dL (ref 8.9–10.3)
CO2: 28 mmol/L (ref 22–32)
CREATININE: 0.74 mg/dL (ref 0.44–1.00)
Chloride: 104 mmol/L (ref 98–111)
GFR calc Af Amer: >60 mL/min (ref >60)
GLUCOSE: 134 mg/dL — AB (ref 70–99)
POTASSIUM: 4.7 mmol/L (ref 3.5–5.1)
Sodium: 139 mmol/L (ref 135–145)
TOTAL PROTEIN: 6.8 g/dL (ref 6.5–8.1)

## 2017-10-09 LAB — CBC WITH DIFFERENTIAL/PLATELET
Basophils Absolute: 0 10*3/uL (ref 0–0.1)
Basophils Relative: 0 %
EOS ABS: 0 10*3/uL (ref 0–0.7)
EOS PCT: 0 %
HCT: 44.2 % (ref 35.0–47.0)
Hemoglobin: 15.5 g/dL (ref 12.0–16.0)
LYMPHS PCT: 11 %
Lymphs Abs: 1.3 10*3/uL (ref 1.0–3.6)
MCH: 32.6 pg (ref 26.0–34.0)
MCHC: 35.1 g/dL (ref 32.0–36.0)
MCV: 92.7 fL (ref 80.0–100.0)
MONO ABS: 0.5 10*3/uL (ref 0.2–0.9)
Monocytes Relative: 4 %
Neutro Abs: 10 10*3/uL — ABNORMAL HIGH (ref 1.4–6.5)
Neutrophils Relative %: 85 %
PLATELETS: 282 10*3/uL (ref 150–440)
RBC: 4.77 MIL/uL (ref 3.80–5.20)
RDW: 13.6 % (ref 11.5–14.5)
WBC: 11.8 10*3/uL — ABNORMAL HIGH (ref 3.6–11.0)

## 2017-10-09 LAB — LACTIC ACID, PLASMA: Lactic Acid, Venous: 2.2 mmol/L (ref 0.5–1.9)

## 2017-10-09 MED ORDER — ALBUTEROL SULFATE (2.5 MG/3ML) 0.083% IN NEBU
5.0000 mg | INHALATION_SOLUTION | Freq: Once | RESPIRATORY_TRACT | Status: AC
  Administered 2017-10-09: 5 mg via RESPIRATORY_TRACT
  Filled 2017-10-09: qty 6

## 2017-10-09 MED ORDER — IOHEXOL 350 MG/ML SOLN
75.0000 mL | Freq: Once | INTRAVENOUS | Status: AC | PRN
  Administered 2017-10-09: 75 mL via INTRAVENOUS

## 2017-10-09 MED ORDER — METHYLPREDNISOLONE SODIUM SUCC 125 MG IJ SOLR
125.0000 mg | Freq: Once | INTRAMUSCULAR | Status: AC
  Administered 2017-10-09: 125 mg via INTRAVENOUS
  Filled 2017-10-09: qty 2

## 2017-10-09 MED ORDER — IPRATROPIUM-ALBUTEROL 0.5-2.5 (3) MG/3ML IN SOLN
3.0000 mL | Freq: Once | RESPIRATORY_TRACT | Status: AC
  Administered 2017-10-09: 3 mL via RESPIRATORY_TRACT
  Filled 2017-10-09: qty 3

## 2017-10-09 MED ORDER — AZITHROMYCIN 250 MG PO TABS: ORAL_TABLET | ORAL | 0 refills | Status: DC

## 2017-10-09 MED ORDER — PREDNISONE 10 MG PO TABS: ORAL_TABLET | ORAL | 0 refills | Status: AC

## 2017-10-09 MED ORDER — ALBUTEROL SULFATE (2.5 MG/3ML) 0.083% IN NEBU
5.0000 mg | INHALATION_SOLUTION | Freq: Once | RESPIRATORY_TRACT | Status: AC
  Administered 2017-10-09: 5 mg via RESPIRATORY_TRACT

## 2017-10-09 MED ORDER — ALBUTEROL SULFATE (2.5 MG/3ML) 0.083% IN NEBU: 2.5000 mg | INHALATION_SOLUTION | Freq: Four times a day (QID) | RESPIRATORY_TRACT | 0 refills | Status: DC | PRN

## 2017-10-09 MED ORDER — ALBUTEROL SULFATE (2.5 MG/3ML) 0.083% IN NEBU
INHALATION_SOLUTION | RESPIRATORY_TRACT | Status: AC
  Administered 2017-10-09: 2.5 mg
  Filled 2017-10-09: qty 6

## 2017-10-09 NOTE — Discharge Instructions (Addendum)
Your labs and CT scan of the chest today were unremarkable.  Please continue taking prednisone as prescribed, Z-Pak, and albuterol.

## 2017-10-09 NOTE — ED Provider Notes (Signed)
[facility] Emergency Department Provider Note  ____________________________________________  Time seen: Approximately 8:43 PM  I have reviewed the triage vital signs and the nursing notes.   HISTORY  Chief Complaint Cough; Wheezing; and Shortness of Breath    HPI Pamela Daniel is a 67 y.o. female with a history of asthma, hiatal hernia, pancreatitis, hypertension who complains of shortness of breath for the past week.  Associated with chest discomfort with coughing and deep breathing.  No fever or chills.  No productive cough.  Started as she arrived in [place] for vacation a week ago.  She went to urgent care there was given steroids Augmentin and an inhaler.  She is continued all these medicines without any improvement in her symptoms.  No exertional symptoms.  Shortness of breath is constant, no aggravating or alleviating factors so far, moderate intensity.  No radiating pain.      Past Medical History:  Diagnosis Date  . Anxiety   . Arthritis    SPINE  . Asthma   . First degree heart block   . Frequency of urination   . Hemorrhoids   . Hiatal hernia    POST RESIDUAL SMALL HH PER IMAGING  . History of acute pancreatitis 03/16/2015  . History of adenomatous polyp of colon    tubular adenoma's 04-08-2014/   hyperplastic bening polyp 2000  . History of concussion 2012   no residual  . History of transient ischemic attack (TIA) 12/22/2011   no residual  . History of vaginal dysplasia 2016  . Hypertension   . Pseudocyst of pancreas   . PSVT (paroxysmal supraventricular tachycardia) ([place])    cardioloigst-  dr Einar Gip--- last visit 2013 per pt and is currently followed by pcp  . Rectal bleeding   . S/P AV (atrioventricular) nodal ablation 01-21-2010    dr Caryl Comes  . Simple renal cyst    bilateral per imaging  . Urgency of urination      Patient Active Problem List   Diagnosis Date Noted  . Acute pancreatitis   . Cough   . Pancreatitis,  acute   . Epigastric abdominal pain   . Pancreatitis 03/16/2015  . VAIN I (vaginal intraepithelial neoplasia grade I) 04/17/2014  . TIA (transient ischemic attack) 12/22/2011  . SVT/ PSVT/ PAT 01/13/2010  . RECTAL BLEEDING 08/01/2007  . DIARRHEA 08/01/2007  . ESOPHAGEAL STRICTURE 07/31/2007  . HIATAL HERNIA 07/31/2007  . COLONIC POLYPS, HX OF 07/31/2007  . HYPERTENSION 03/14/2007  . G E R D 03/14/2007     Past Surgical History:  Procedure Laterality Date  . BREAST EXCISIONAL BIOPSY Right   . CARDIAC ELECTROPHYSIOLOGY MAPPING AND ABLATION  01-21-2010   dr Caryl Comes   ablation AVNRT  . FOOT NEUROMA SURGERY Left 1996  . HEMORRHOID SURGERY N/A 10/28/2016   Procedure: HEMORRHOIDECTOMY AND HEMORRHOIDAL PEXY;  Surgeon: Leighton Ruff, MD;  Location: [facility];  Service: General;  Laterality: N/A;  . LAPAROSCOPY TAKEDOWN AND REPAIR HIATAL HERNIA /  NISSEN FUNDOPLATION  09-01-2005    dr Hassell Done  [facility]  . NASAL SEPTUM SURGERY  1991  . TEE WITHOUT CARDIOVERSION  02/15/2012   Procedure: TRANSESOPHAGEAL ECHOCARDIOGRAM (TEE);  Surgeon: Laverda Page, MD;  Location: [facility] ENDOSCOPY;  Service: Cardiovascular;  Laterality: N/A;  normal LV, normal EF, mild MR, trace TR, trace PI  . TRANSTHORACIC ECHOCARDIOGRAM  12-22-2011    dr Einar Gip   normal echo  . VAGINAL HYSTERECTOMY  1996   w/  BSO  Prior to Admission medications   Medication Sig Start Date End Date Taking? Authorizing Provider  acetaminophen (TYLENOL) 325 MG tablet Take 650 mg by mouth every 6 (six) hours as needed for mild pain.    [provider]  albuterol (PROVENTIL) (2.5 MG/3ML) 0.083% nebulizer solution Take 6 mLs (5 mg total) by nebulization once. 04/06/15   Margaretann Loveless, MD  albuterol (PROVENTIL) (2.5 MG/3ML) 0.083% nebulizer solution Take 3 mLs (2.5 mg total) by nebulization every 6 (six) hours as needed for wheezing or shortness of breath. 10/09/17   Carrie Mew, MD  ALPRAZolam Duanne Moron) 0.25 MG tablet  Take 0.25 mg by mouth at bedtime as needed for anxiety.    [provider]  aspirin 325 MG tablet Take 325 mg by mouth daily.    [provider]  azithromycin (ZITHROMAX Z-PAK) 250 MG tablet Take 2 tablets (500 mg) on  Day 1,  followed by 1 tablet (250 mg) once daily on Days 2 through 5. 10/09/17   Carrie Mew, MD  budesonide-formoterol [facility]) 160-4.5 MCG/ACT inhaler Inhale 2 puffs into the lungs 2 (two) times daily.    [provider]  cyclobenzaprine (FLEXERIL) 10 MG tablet Take 10 mg by mouth 3 (three) times daily as needed for muscle spasms.    [provider]  fluticasone (FLONASE) 50 MCG/ACT nasal spray Place 1 spray into both nostrils as needed.  11/17/16   [provider]  ibuprofen (ADVIL,MOTRIN) 200 MG tablet Take 400 mg by mouth every 6 (six) hours.    [provider]  oxyCODONE (OXY IR/ROXICODONE) 5 MG immediate release tablet Take 1-2 tablets (5-10 mg total) by mouth every 6 (six) hours as needed. 07/13/92   Leighton Ruff, MD  predniSONE (DELTASONE) 10 MG tablet Take 5 tablets (50 mg total) by mouth daily for 3 days, THEN 4 tablets (40 mg total) daily for 3 days, THEN 3 tablets (30 mg total) daily for 3 days, THEN 2 tablets (20 mg total) daily for 3 days, THEN 1 tablet (10 mg total) daily for 3 days. 10/09/17 10/24/17  Carrie Mew, MD  triamcinolone cream (KENALOG) 0.1 % Apply 1 application topically 2 (two) times daily as needed.     [provider]     Allergies Patient has no known allergies.   Family History  Problem Relation Age of Onset  . Heart disease Mother 71       Heart failure >> death at age 78  . Dementia Mother   . Hypertension Mother   . Cancer Mother        Breast and colon  . Breast cancer Mother 75  . Heart disease Father 49       MI  . COPD Father   . Heart disease Brother   . Mental illness Brother   . Cancer Brother 1       Lung and bone  . Cancer Brother        Lung and  bone  . Cancer Brother        Colon  . Breast cancer Maternal Aunt   . Breast cancer Cousin     Social History Social History   Tobacco Use  . Smoking status: Never Smoker  . Smokeless tobacco: Never Used  Substance Use Topics  . Alcohol use: No  . Drug use: No    Review of Systems  Constitutional:   No fever or chills.  ENT:   No sore throat. No rhinorrhea. Cardiovascular:   Positive pleuritic  chest discomfort without syncope. Respiratory:   Positive shortness of breath and nonproductive cough. Gastrointestinal:   Negative for abdominal pain, vomiting and diarrhea.  Musculoskeletal:   Negative for focal pain or swelling All other systems reviewed and are negative except as documented above in ROS and HPI.  ____________________________________________   PHYSICAL EXAM:  VITAL SIGNS: ED Triage Vitals  Enc Vitals Group     BP 10/09/17 1755 140/79     Pulse Rate 10/09/17 1755 79     Resp 10/09/17 1755 20     Temp 10/09/17 1755 98.7 F (37.1 C)     Temp Source 10/09/17 1755 Oral     SpO2 10/09/17 1755 95 %     Weight 10/09/17 1755 187 lb (84.8 kg)     Height 10/09/17 1755 5\' 4"  (1.626 m)     Head Circumference --      Peak Flow --      Pain Score 10/09/17 1754 0     Pain Loc --      Pain Edu? --      Excl. in [place]? --     Vital signs reviewed, nursing assessments reviewed.   Constitutional:   Alert and oriented. Non-toxic appearance. Eyes:   Conjunctivae are normal. EOMI. PERRL. ENT      Head:   Normocephalic and atraumatic.      Nose:   No congestion/rhinnorhea.       Mouth/Throat:   MMM, no pharyngeal erythema. No peritonsillar mass.       Neck:   No meningismus. Full ROM. Hematological/Lymphatic/Immunilogical:   No cervical lymphadenopathy. Cardiovascular:   RRR. Symmetric bilateral radial and DP pulses.  No murmurs. Cap refill less than 2 seconds. Respiratory:   Normal respiratory effort without tachypnea/retractions.  Diffuse expiratory wheezing.  Good  air entry in all lung fields.. Gastrointestinal:   Soft and nontender. Non distended. There is no CVA tenderness.  No rebound, rigidity, or guarding. Musculoskeletal:   Normal range of motion in all extremities. No joint effusions.  No lower extremity tenderness.  No edema. Neurologic:   Normal speech and language.  Motor grossly intact. No acute focal neurologic deficits are appreciated.  Skin:    Skin is warm, dry and intact. No rash noted.  No petechiae, purpura, or bullae.  ____________________________________________    LABS (pertinent positives/negatives) (all labs ordered are listed, but only abnormal results are displayed) Labs Reviewed  LACTIC ACID, PLASMA - Abnormal; Notable for the following components:      Result Value   Lactic Acid, Venous 2.2 (*)    All other components within normal limits  COMPREHENSIVE METABOLIC PANEL - Abnormal; Notable for the following components:   Glucose, Bld 134 (*)    All other components within normal limits  CBC WITH DIFFERENTIAL/PLATELET - Abnormal; Notable for the following components:   WBC 11.8 (*)    Neutro Abs 10.0 (*)    All other components within normal limits  LACTIC ACID, PLASMA  URINALYSIS, COMPLETE (UACMP) WITH MICROSCOPIC   ____________________________________________   EKG  Interpreted by me Normal sinus rhythm rate of 79, normal axis intervals QRS ST segments and T waves.  ____________________________________________    [id number]  Dg Chest 2 View  Result Date: 10/09/2017 CLINICAL DATA:  Shortness of breath, wheezing EXAM: CHEST - 2 VIEW COMPARISON:  06/30/2016 FINDINGS: Heart and mediastinal contours are within normal limits. No focal opacities or effusions. No acute bony abnormality. IMPRESSION: No active cardiopulmonary disease. Electronically Signed   By: Lennette Bihari  [place] M.D.   On: 10/09/2017 18:43   Ct Angio Chest Pe W And/or Wo Contrast  Result Date: 10/09/2017 CLINICAL DATA:  Shortness of breath. EXAM: CT  ANGIOGRAPHY CHEST WITH CONTRAST TECHNIQUE: Multidetector CT imaging of the chest was performed using the standard protocol during bolus administration of intravenous contrast. Multiplanar CT image reconstructions and MIPs were obtained to evaluate the vascular anatomy. CONTRAST:  63mL OMNIPAQUE IOHEXOL 350 MG/ML SOLN COMPARISON:  04/06/2015 FINDINGS: Cardiovascular: Heart is upper normal. No pericardial effusion. Coronary artery calcification is evident. Atherosclerotic calcification is noted in the wall of the thoracic aorta. No filling defect in the opacified pulmonary arteries to suggest the presence of an acute pulmonary embolus. Mediastinum/Nodes: No mediastinal lymphadenopathy. There is no hilar lymphadenopathy. Tiny hiatal hernia. Esophagus unremarkable. There is no axillary lymphadenopathy. Lungs/Pleura: The central tracheobronchial airways are patent. No focal airspace consolidation. No pulmonary edema or pleural effusion. No suspicious pulmonary nodule or mass. Bronchial wall thickening noted with some small airway impaction in the lower lobes bilaterally. Upper Abdomen: The liver shows diffusely decreased attenuation suggesting steatosis. Musculoskeletal: No worrisome lytic or sclerotic osseous abnormality. Review of the MIP images confirms the above findings. IMPRESSION: 1. No CT evidence for acute pulmonary embolus. 2. Bronchial wall thickening with small airway impaction in both lower lobes. 3.  Aortic Atherosclerois (ICD10-170.0) 4. Tiny hiatal hernia. Electronically Signed   By: Misty Stanley M.D.   On: 10/09/2017 19:48    ____________________________________________   PROCEDURES Procedures  ____________________________________________  DIFFERENTIAL DIAGNOSIS   Pneumonia, asthma exacerbation, pulmonary embolism, pleurisy  CLINICAL IMPRESSION / ASSESSMENT AND PLAN / ED COURSE  Pertinent labs & imaging results that were available during my care of the patient were reviewed by me and  considered in my medical decision making (see chart for details).    Patient presents with persistent asthma symptoms for the past week despite bronchodilators, steroids, and Augmentin.  Given her recent travel recently and failure to improve without other clear examination of her symptoms I am suspicious of pulmonary embolism and will obtain a CT scan.  Bronchodilators and steroids here to attempt to continue symptomatic relief while pursuing work-up.  Clinical Course as of Oct 09 2041  Nancy Fetter Oct 09, 2017  2001 Lactate is of unclear significance. No suspected infection. VS normal. On exam normal perfusion. I would not have checked this , but it was ordered by nursing protocol. CTA chest negative for PE. Suggests some lower airway "impaction." will treat with zpak, continue prednisone, continue BDs, f/u pulm.   Lactic Acid, Venous(!!): 2.2 [PS]    Clinical Course User Index [PS] Carrie Mew, MD     ----------------------------------------- 8:46 PM on 10/09/2017 -----------------------------------------  Patient feels better.  Labs are reassuring, CT reassuring.  I will prescribe a prolonged taper of prednisone, Z-Pak, refill of her nebulizer solution at home for albuterol, follow-up with primary care.  Recommended follow-up with pulmonology if not much improved in the next 2 to 3 days.  ____________________________________________   FINAL CLINICAL IMPRESSION(S) / ED DIAGNOSES    Final diagnoses:  Moderate persistent asthma with exacerbation     ED Discharge Orders         Ordered    albuterol (PROVENTIL) (2.5 MG/3ML) 0.083% nebulizer solution  Every 6 hours PRN     10/09/17 2038    azithromycin (ZITHROMAX Z-PAK) 250 MG tablet     10/09/17 2038    predniSONE (DELTASONE) 10 MG tablet     10/09/17 2038  Portions of this note were generated with dragon dictation software. Dictation errors may occur despite best attempts at proofreading.    Carrie Mew,  MD 10/09/17 2046

## 2017-10-09 NOTE — ED Triage Notes (Signed)
States had an asthma attack on Monday and was seen through Urgent Care in [facility]-- Sats were 91% and patient was given nebulizer and steroid and discharged home on prednisone, rescue inhaler, and Augmentin.  Patient arrives today with no improvement of symptoms.  Denies fevers this week.

## 2017-12-19 DIAGNOSIS — I10 Essential (primary) hypertension: Secondary | ICD-10-CM | POA: Diagnosis not present

## 2017-12-19 DIAGNOSIS — R82998 Other abnormal findings in urine: Secondary | ICD-10-CM | POA: Diagnosis not present

## 2017-12-19 DIAGNOSIS — M859 Disorder of bone density and structure, unspecified: Secondary | ICD-10-CM | POA: Diagnosis not present

## 2018-01-20 DIAGNOSIS — N281 Cyst of kidney, acquired: Secondary | ICD-10-CM | POA: Diagnosis not present

## 2018-01-20 DIAGNOSIS — J45998 Other asthma: Secondary | ICD-10-CM | POA: Diagnosis not present

## 2018-01-20 DIAGNOSIS — K862 Cyst of pancreas: Secondary | ICD-10-CM | POA: Diagnosis not present

## 2018-01-20 DIAGNOSIS — E559 Vitamin D deficiency, unspecified: Secondary | ICD-10-CM | POA: Diagnosis not present

## 2018-01-20 DIAGNOSIS — F418 Other specified anxiety disorders: Secondary | ICD-10-CM | POA: Diagnosis not present

## 2018-01-20 DIAGNOSIS — K219 Gastro-esophageal reflux disease without esophagitis: Secondary | ICD-10-CM | POA: Diagnosis not present

## 2018-01-20 DIAGNOSIS — I10 Essential (primary) hypertension: Secondary | ICD-10-CM | POA: Diagnosis not present

## 2018-01-20 DIAGNOSIS — R220 Localized swelling, mass and lump, head: Secondary | ICD-10-CM | POA: Diagnosis not present

## 2018-01-20 DIAGNOSIS — Z0000 Encounter for general adult medical examination without abnormal findings: Secondary | ICD-10-CM | POA: Diagnosis not present

## 2018-01-20 DIAGNOSIS — Z23 Encounter for immunization: Secondary | ICD-10-CM | POA: Diagnosis not present

## 2018-02-28 ENCOUNTER — Ambulatory Visit (HOSPITAL_COMMUNITY)
Admission: EM | Admit: 2018-02-28 | Discharge: 2018-02-28 | Disposition: A | Discharge status: Home / Self Care | Payer: Medicare HMO | Attending: Family Medicine | Admitting: Family Medicine

## 2018-02-28 ENCOUNTER — Encounter (HOSPITAL_COMMUNITY): Payer: Self-pay | Admitting: Emergency Medicine

## 2018-02-28 DIAGNOSIS — J0100 Acute maxillary sinusitis, unspecified: Secondary | ICD-10-CM | POA: Diagnosis not present

## 2018-02-28 MED ORDER — DOXYCYCLINE HYCLATE 100 MG PO CAPS: 100.0000 mg | ORAL_CAPSULE | Freq: Two times a day (BID) | ORAL | 0 refills | Status: DC

## 2018-02-28 MED ORDER — IPRATROPIUM BROMIDE 0.06 % NA SOLN: 2.0000 | Freq: Four times a day (QID) | NASAL | 0 refills | Status: DC

## 2018-02-28 MED ORDER — ALBUTEROL SULFATE (2.5 MG/3ML) 0.083% IN NEBU: 2.5000 mg | INHALATION_SOLUTION | Freq: Four times a day (QID) | RESPIRATORY_TRACT | 0 refills | Status: DC | PRN

## 2018-02-28 NOTE — ED Triage Notes (Signed)
Pt presents to [facility] for assessment of 2 weeks of strong cough that causes gagging, congestion, post-nasal drip, loss of appetite.  Pt has used nebulizers at home without relief of tightness.

## 2018-02-28 NOTE — ED Provider Notes (Signed)
[place]    CSN: [id number] Arrival date & time: 02/28/18  1025     History   Chief Complaint Chief Complaint  Patient presents with  . URI  . Asthma    HPI Pamela Daniel is a 68 y.o. female.   68 year old female comes in for 2 week history of URI symptoms. Cough, rhinorrhea, nasal congestion, sneezing. Post nasal drip, sinus tenderness. Had subjective fever, chills, nausea that has since resolved. Denies chest pain. States shortness of breath/wheezing that is improved by albuterol. otc cold medicine without relief. Never smoker. Positive sick contact.      Past Medical History:  Diagnosis Date  . Anxiety   . Arthritis    SPINE  . Asthma   . First degree heart block   . Frequency of urination   . Hemorrhoids   . Hiatal hernia    POST RESIDUAL SMALL HH PER IMAGING  . History of acute pancreatitis 03/16/2015  . History of adenomatous polyp of colon    tubular adenoma's 04-08-2014/   hyperplastic bening polyp 2000  . History of concussion 2012   no residual  . History of transient ischemic attack (TIA) 12/22/2011   no residual  . History of vaginal dysplasia 2016  . Hypertension   . Pseudocyst of pancreas   . PSVT (paroxysmal supraventricular tachycardia) ([place])    cardioloigst-  dr Einar Gip--- last visit 2013 per pt and is currently followed by pcp  . Rectal bleeding   . S/P AV (atrioventricular) nodal ablation 01-21-2010    dr Caryl Comes  . Simple renal cyst    bilateral per imaging  . Urgency of urination     Patient Active Problem List   Diagnosis Date Noted  . Acute pancreatitis   . Cough   . Pancreatitis, acute   . Epigastric abdominal pain   . Pancreatitis 03/16/2015  . VAIN I (vaginal intraepithelial neoplasia grade I) 04/17/2014  . TIA (transient ischemic attack) 12/22/2011  . SVT/ PSVT/ PAT 01/13/2010  . RECTAL BLEEDING 08/01/2007  . DIARRHEA 08/01/2007  . ESOPHAGEAL STRICTURE 07/31/2007  . HIATAL HERNIA 07/31/2007  . COLONIC  POLYPS, HX OF 07/31/2007  . HYPERTENSION 03/14/2007  . G E R D 03/14/2007    Past Surgical History:  Procedure Laterality Date  . BREAST EXCISIONAL BIOPSY Right   . CARDIAC ELECTROPHYSIOLOGY MAPPING AND ABLATION  01-21-2010   dr Caryl Comes   ablation AVNRT  . FOOT NEUROMA SURGERY Left 1996  . HEMORRHOID SURGERY N/A 10/28/2016   Procedure: HEMORRHOIDECTOMY AND HEMORRHOIDAL PEXY;  Surgeon: Leighton Ruff, MD;  Location: [facility];  Service: General;  Laterality: N/A;  . LAPAROSCOPY TAKEDOWN AND REPAIR HIATAL HERNIA /  NISSEN FUNDOPLATION  09-01-2005    dr Hassell Done  [facility]  . NASAL SEPTUM SURGERY  1991  . TEE WITHOUT CARDIOVERSION  02/15/2012   Procedure: TRANSESOPHAGEAL ECHOCARDIOGRAM (TEE);  Surgeon: Laverda Page, MD;  Location: [facility] ENDOSCOPY;  Service: Cardiovascular;  Laterality: N/A;  normal LV, normal EF, mild MR, trace TR, trace PI  . TRANSTHORACIC ECHOCARDIOGRAM  12-22-2011    dr Einar Gip   normal echo  . VAGINAL HYSTERECTOMY  1996   w/  BSO    OB History    Gravida  4   Para  4   Term      Preterm  1   AB      Living  3     SAB      TAB  Ectopic      Multiple      Live Births               Home Medications    Prior to Admission medications   Medication Sig Start Date End Date Taking? Authorizing Provider  albuterol (PROVENTIL) (2.5 MG/3ML) 0.083% nebulizer solution Take 6 mLs (5 mg total) by nebulization once. 04/06/15  Yes Margaretann Loveless, MD  aspirin 325 MG tablet Take 325 mg by mouth daily.   Yes [provider]  calcium-vitamin D (OSCAL WITH D) 500-200 MG-UNIT tablet Take 1 tablet by mouth.   Yes [provider]  Cholecalciferol (VITAMIN D3) 125 MCG (5000 UT) CAPS Take by mouth.   Yes [provider]  fluticasone (FLONASE) 50 MCG/ACT nasal spray Place 1 spray into both nostrils as needed.  11/17/16  Yes [provider]  acetaminophen (TYLENOL) 325 MG tablet Take 650 mg by mouth every 6 (six) hours as  needed for mild pain.    [provider]  albuterol (PROVENTIL) (2.5 MG/3ML) 0.083% nebulizer solution Take 3 mLs (2.5 mg total) by nebulization every 6 (six) hours as needed for wheezing or shortness of breath. 02/28/18   Tasia Catchings, Dynasty Holquin V, PA-C  ALPRAZolam Duanne Moron) 0.25 MG tablet Take 0.25 mg by mouth at bedtime as needed for anxiety.    [provider]  doxycycline (VIBRAMYCIN) 100 MG capsule Take 1 capsule (100 mg total) by mouth 2 (two) times daily. 02/28/18   Tasia Catchings, Declyn Delsol V, PA-C  ibuprofen (ADVIL,MOTRIN) 200 MG tablet Take 400 mg by mouth every 6 (six) hours.    [provider]  ipratropium (ATROVENT) 0.06 % nasal spray Place 2 sprays into both nostrils 4 (four) times daily. 02/28/18   Tasia Catchings, Hannalee Castor V, PA-C  triamcinolone cream (KENALOG) 0.1 % Apply 1 application topically 2 (two) times daily as needed.     [provider]    Family History Family History  Problem Relation Age of Onset  . Heart disease Mother 41       Heart failure >> death at age 4  . Dementia Mother   . Hypertension Mother   . Cancer Mother        Breast and colon  . Breast cancer Mother 59  . Heart disease Father 66       MI  . COPD Father   . Heart disease Brother   . Mental illness Brother   . Cancer Brother 13       Lung and bone  . Cancer Brother        Lung and bone  . Cancer Brother        Colon  . Breast cancer Maternal Aunt   . Breast cancer Cousin     Social History Social History   Tobacco Use  . Smoking status: Never Smoker  . Smokeless tobacco: Never Used  Substance Use Topics  . Alcohol use: No  . Drug use: No     Allergies   Patient has no known allergies.   Review of Systems Review of Systems  Reason unable to perform ROS: See HPI as above.     Physical Exam Triage Vital Signs ED Triage Vitals  Enc Vitals Group     BP 02/28/18 1138 (!) 131/103     Pulse Rate 02/28/18 1138 73     Resp 02/28/18 1138 20     Temp 02/28/18 1138 98 F (36.7 C)     Temp  Source 02/28/18 1138  Oral     SpO2 02/28/18 1138 97 %     Weight --      Height --      Head Circumference --      Peak Flow --      Pain Score 02/28/18 1140 0     Pain Loc --      Pain Edu? --      Excl. in [place]? --    No data found.  Updated Vital Signs BP (!) 131/103 (BP Location: Left Arm)   Pulse 73   Temp 98 F (36.7 C) (Oral)   Resp 20   SpO2 97%   Physical Exam Constitutional:      General: She is not in acute distress.    Appearance: She is well-developed. She is not ill-appearing, toxic-appearing or diaphoretic.  HENT:     Head: Normocephalic and atraumatic.     Right Ear: Tympanic membrane, ear canal and external ear normal. Tympanic membrane is not erythematous or bulging.     Left Ear: Tympanic membrane, ear canal and external ear normal. Tympanic membrane is not erythematous or bulging.     Nose:     Right Sinus: Maxillary sinus tenderness present. No frontal sinus tenderness.     Left Sinus: Maxillary sinus tenderness present. No frontal sinus tenderness.     Mouth/Throat:     Pharynx: Uvula midline.  Eyes:     Conjunctiva/sclera: Conjunctivae normal.     Pupils: Pupils are equal, round, and reactive to light.  Neck:     Musculoskeletal: Normal range of motion and neck supple.  Cardiovascular:     Rate and Rhythm: Normal rate and regular rhythm.     Heart sounds: Normal heart sounds. No murmur. No friction rub. No gallop.   Pulmonary:     Effort: Pulmonary effort is normal.     Breath sounds: Normal breath sounds. No decreased breath sounds, wheezing, rhonchi or rales.  Lymphadenopathy:     Cervical: No cervical adenopathy.  Skin:    General: Skin is warm and dry.  Neurological:     Mental Status: She is alert and oriented to person, place, and time.  Psychiatric:        Behavior: Behavior normal.        Judgment: Judgment normal.      UC Treatments / Results  Labs (all labs ordered are listed, but only abnormal results are displayed) Labs  Reviewed - No data to display  EKG None  Radiology No results found.  Procedures Procedures (including critical care time)  Medications Ordered in UC Medications - No data to display  Initial Impression / Assessment and Plan / UC Course  I have reviewed the triage vital signs and the nursing notes.  Pertinent labs & imaging results that were available during my care of the patient were reviewed by me and considered in my medical decision making (see chart for details).    Start doxycycline and prednisone as directed.  Albuterol as needed for shortness of breath and wheezing.  Other symptomatic treatment discussed.  Push fluids.  Return precautions given.  Patient expresses understanding and agrees to plan.  Patient has left over prednisone pack from prior treatment.  States had an extra called in, but never started, and her provider had discontinued prednisone given improvement of symptoms.  States medication is not expired.  Patient can take for current treatment.  Final Clinical Impressions(s) / UC Diagnoses   Final diagnoses:  Acute non-recurrent maxillary sinusitis  ED Prescriptions    Medication Sig Dispense Auth. Provider   albuterol (PROVENTIL) (2.5 MG/3ML) 0.083% nebulizer solution Take 3 mLs (2.5 mg total) by nebulization every 6 (six) hours as needed for wheezing or shortness of breath. 75 mL Viraj Liby V, PA-C   ipratropium (ATROVENT) 0.06 % nasal spray Place 2 sprays into both nostrils 4 (four) times daily. 15 mL Analeia Ismael V, PA-C   doxycycline (VIBRAMYCIN) 100 MG capsule Take 1 capsule (100 mg total) by mouth 2 (two) times daily. 20 capsule Tobin Chad, [place] 02/28/18 1201

## 2018-02-28 NOTE — Discharge Instructions (Signed)
Start doxycycline as directed. Start your prednisone pack as directed. Start flonase, atrovent nasal spray for nasal congestion/drainage. You can use over the counter nasal saline rinse such as neti pot for nasal congestion. Keep hydrated, your urine should be clear to pale yellow in color. Tylenol/motrin for fever and pain. Monitor for any worsening of symptoms, chest pain, shortness of breath, wheezing, swelling of the throat, follow up for reevaluation.   For sore throat/cough try using a honey-based tea. Use 3 teaspoons of honey with juice squeezed from half lemon. Place shaved pieces of ginger into 1/2-1 cup of water and warm over stove top. Then mix the ingredients and repeat every 4 hours as needed.

## 2018-04-21 ENCOUNTER — Other Ambulatory Visit: Payer: Self-pay

## 2018-04-21 ENCOUNTER — Encounter: Payer: Self-pay | Admitting: *Deleted

## 2018-04-21 ENCOUNTER — Emergency Department
Admission: EM | Admit: 2018-04-21 | Discharge: 2018-04-22 | Disposition: A | Discharge status: Home / Self Care | Payer: Medicare HMO | Attending: Emergency Medicine | Admitting: Emergency Medicine

## 2018-04-21 DIAGNOSIS — Z7982 Long term (current) use of aspirin: Secondary | ICD-10-CM | POA: Insufficient documentation

## 2018-04-21 DIAGNOSIS — R002 Palpitations: Secondary | ICD-10-CM | POA: Insufficient documentation

## 2018-04-21 DIAGNOSIS — R55 Syncope and collapse: Secondary | ICD-10-CM

## 2018-04-21 DIAGNOSIS — Z79899 Other long term (current) drug therapy: Secondary | ICD-10-CM | POA: Insufficient documentation

## 2018-04-21 DIAGNOSIS — J45909 Unspecified asthma, uncomplicated: Secondary | ICD-10-CM | POA: Insufficient documentation

## 2018-04-21 LAB — HEPATIC FUNCTION PANEL
ALT: 12 U/L (ref 0–44)
AST: 16 U/L (ref 15–41)
Albumin: 3.6 g/dL (ref 3.5–5.0)
Alkaline Phosphatase: 64 U/L (ref 38–126)
Bilirubin, Direct: <0.1 mg/dL (ref 0.0–0.2)
Total Bilirubin: 0.5 mg/dL (ref 0.3–1.2)
Total Protein: 6.5 g/dL (ref 6.5–8.1)

## 2018-04-21 LAB — LIPASE, BLOOD: Lipase: 27 U/L (ref 11–51)

## 2018-04-21 LAB — BASIC METABOLIC PANEL
Anion gap: 4 — ABNORMAL LOW (ref 5–15)
BUN: 17 mg/dL (ref 8–23)
CO2: 24 mmol/L (ref 22–32)
Calcium: 8.8 mg/dL — ABNORMAL LOW (ref 8.9–10.3)
Chloride: 111 mmol/L (ref 98–111)
Creatinine, Ser: 0.81 mg/dL (ref 0.44–1.00)
GFR calc Af Amer: >60 mL/min (ref >60)
GFR calc non Af Amer: >60 mL/min (ref >60)
Glucose, Bld: 130 mg/dL — ABNORMAL HIGH (ref 70–99)
Potassium: 3.8 mmol/L (ref 3.5–5.1)
Sodium: 139 mmol/L (ref 135–145)

## 2018-04-21 LAB — CBC
HCT: 43.9 % (ref 36.0–46.0)
Hemoglobin: 14.7 g/dL (ref 12.0–15.0)
MCH: 31.2 pg (ref 26.0–34.0)
MCHC: 33.5 g/dL (ref 30.0–36.0)
MCV: 93.2 fL (ref 80.0–100.0)
Platelets: 259 10*3/uL (ref 150–400)
RBC: 4.71 MIL/uL (ref 3.87–5.11)
RDW: 12.8 % (ref 11.5–15.5)
WBC: 7.9 10*3/uL (ref 4.0–10.5)
nRBC: 0 % (ref 0.0–0.2)

## 2018-04-21 LAB — URINALYSIS, COMPLETE (UACMP) WITH MICROSCOPIC
Bilirubin Urine: NEGATIVE
Glucose, UA: NEGATIVE mg/dL
Hgb urine dipstick: NEGATIVE
Ketones, ur: NEGATIVE mg/dL
Nitrite: NEGATIVE
Protein, ur: NEGATIVE mg/dL
Specific Gravity, Urine: 1.019 (ref 1.005–1.030)
pH: 5 (ref 5.0–8.0)

## 2018-04-21 LAB — MAGNESIUM: Magnesium: 1.9 mg/dL (ref 1.7–2.4)

## 2018-04-21 LAB — TROPONIN I: Troponin I: <0.03 ng/mL (ref <0.03)

## 2018-04-21 NOTE — ED Triage Notes (Signed)
Pt reports she has been having multiple episodes of what feels like hot flashes. Pt reports her face becomes red and she feels as though her heart is beating irregularly. PT has a hx of Afib and irregular heart beats. Pt reports a near syncopal episode each of the past three days. nO syncope, SOB, CP or cough. No fevers.

## 2018-04-21 NOTE — ED Notes (Signed)
Pt states she had heart palpitations since Wednesday, pt states it feels like her heart is flipping, when she feels this she becomes lightheaded.

## 2018-04-21 NOTE — Discharge Instructions (Signed)
Your workup in the Emergency Department today was reassuring.  We did not find any specific abnormalities.  We recommend you drink plenty of fluids, take your regular medications and/or any new ones prescribed today, and follow up with the doctor(s) listed in these documents as recommended.  Return to the Emergency Department if you develop new or worsening symptoms that concern you.  

## 2018-04-21 NOTE — ED Provider Notes (Signed)
[facility] Emergency Department Provider Note  ____________________________________________   First MD Initiated Contact with Patient 04/21/18 2313     (approximate)  I have reviewed the triage vital signs and the nursing notes.   HISTORY  Chief Complaint Near Syncope    HPI Pamela Daniel is a 68 y.o. female with medical history as listed below whose medical history includes issues with PSVT status post ablation who presents for evaluation of several episodes of palpitations and near syncope over the last 3 days.  She is very diligent about checking her blood pressure with a wrist blood pressure cuff and said that her first episode was 2 days ago while she was at church.  She felt like her heart was jumping around in her chest and that it happened again yesterday and again tonight.  She checks her blood pressure after it happens and her blood pressure is frequently elevated but then other times it will be lower than anticipated.  Her episode tonight was more worrisome to her because she was lying down at the time, not straining, and her heart rate dropped down into the 40s, which was unusual for her because if anything she usually has episodes of having too fast of a heart rate.  She has not been on any cardiac medication since having her ablation which was in 2013 or 2011 (she cannot remember exactly when).  She does not drink, smoke, or use drugs and has not had any medication changes recently.  She is otherwise been feeling well and denies fever/chills, chest pain, shortness of breath, nausea, vomiting, abdominal pain, diarrhea, and constipation.  Nothing in particular seems to make the symptoms better or worse.  She states she did not want to come in, particularly during the COVID-19 pandemic, but was concerned about her episode tonight so felt she should be checked out.  She is not currently managed by a cardiologist although she is Dr. Virl Axe in the past  for the SVT/ablation.         Past Medical History:  Diagnosis Date   Anxiety    Arthritis    SPINE   Asthma    First degree heart block    Frequency of urination    Hemorrhoids    Hiatal hernia    POST RESIDUAL SMALL HH PER IMAGING   History of acute pancreatitis 03/16/2015   History of adenomatous polyp of colon    tubular adenoma's 04-08-2014/   hyperplastic bening polyp 2000   History of concussion 2012   no residual   History of transient ischemic attack (TIA) 12/22/2011   no residual   History of vaginal dysplasia 2016   Hypertension    Pseudocyst of pancreas    PSVT (paroxysmal supraventricular tachycardia) ([place])    cardioloigst-  dr Einar Gip--- last visit 2013 per pt and is currently followed by pcp   Rectal bleeding    S/P AV (atrioventricular) nodal ablation 01-21-2010    dr Caryl Comes   Simple renal cyst    bilateral per imaging   Urgency of urination     Patient Active Problem List   Diagnosis Date Noted   Acute pancreatitis    Cough    Pancreatitis, acute    Epigastric abdominal pain    Pancreatitis 03/16/2015   VAIN I (vaginal intraepithelial neoplasia grade I) 04/17/2014   TIA (transient ischemic attack) 12/22/2011   SVT/ PSVT/ PAT 01/13/2010   RECTAL BLEEDING 08/01/2007   DIARRHEA 08/01/2007   ESOPHAGEAL  STRICTURE 07/31/2007   HIATAL HERNIA 07/31/2007   COLONIC POLYPS, HX OF 07/31/2007   HYPERTENSION 03/14/2007   G E R D 03/14/2007    Past Surgical History:  Procedure Laterality Date   BREAST EXCISIONAL BIOPSY Right    CARDIAC ELECTROPHYSIOLOGY MAPPING AND ABLATION  01-21-2010   dr Caryl Comes   ablation AVNRT   FOOT NEUROMA SURGERY Left 1996   HEMORRHOID SURGERY N/A 10/28/2016   Procedure: HEMORRHOIDECTOMY AND HEMORRHOIDAL PEXY;  Surgeon: Leighton Ruff, MD;  Location: [facility];  Service: General;  Laterality: N/A;   LAPAROSCOPY TAKEDOWN AND REPAIR HIATAL HERNIA /  NISSEN FUNDOPLATION   09-01-2005    dr Hassell Done  [facility]   NASAL SEPTUM SURGERY  1991   TE[facility] [place]  02/15/2012   Procedure: TRANSESOPHAGEAL ECHOCARDIOGRAM (TEE);  Surgeon: Laverda Page, MD;  Location: [facility];  Service: Cardiovascular;  Laterality: N/A;  normal LV, normal EF, mild MR, trace TR, trace PI   TRANSTHORACIC ECHOCARDIOGRAM  12-22-2011    dr Einar Gip   normal echo   [place]   w/  BSO    Prior to Admission medications   Medication Sig Start Date End Date Taking? Authorizing Provider  acetaminophen (TYLENOL) 325 MG tablet Take 650 mg by mouth every 6 (six) hours as needed for mild pain.    [provider]  albuterol (PROVENTIL) (2.5 MG/3ML) 0.083% nebulizer solution Take 6 mLs (5 mg total) by nebulization once. 04/06/15   Margaretann Loveless, MD  albuterol (PROVENTIL) (2.5 MG/3ML) 0.083% nebulizer solution Take 3 mLs (2.5 mg total) by nebulization every 6 (six) hours as needed for wheezing or shortness of breath. 02/28/18   Tasia Catchings, Amy V, PA-C  ALPRAZolam Duanne Moron) 0.25 MG tablet Take 0.25 mg by mouth at bedtime as needed for anxiety.    [provider]  aspirin 325 MG tablet Take 325 mg by mouth daily.    [provider]  calcium-vitamin D (OSCAL WITH D) 500-200 MG-UNIT tablet Take 1 tablet by mouth.    [provider]  Cholecalciferol (VITAMIN D3) 125 MCG (5000 UT) CAPS Take by mouth.    [provider]  doxycycline (VIBRAMYCIN) 100 MG capsule Take 1 capsule (100 mg total) by mouth 2 (two) times daily. 02/28/18   Tasia Catchings, Amy V, PA-C  fluticasone (FLONASE) 50 MCG/ACT nasal spray Place 1 spray into both nostrils as needed.  11/17/16   [provider]  ibuprofen (ADVIL,MOTRIN) 200 MG tablet Take 400 mg by mouth every 6 (six) hours.    [provider]  ipratropium (ATROVENT) 0.06 % nasal spray Place 2 sprays into both nostrils 4 (four) times daily. 02/28/18   Tasia Catchings, Amy V, PA-C  triamcinolone cream (KENALOG) 0.1 % Apply 1  application topically 2 (two) times daily as needed.     [provider]    Allergies Patient has no known allergies.  Family History  Problem Relation Age of Onset   Heart disease Mother 34       Heart failure >> death at age 68   Dementia Mother    Hypertension Mother    Cancer Mother        Breast and colon   Breast cancer Mother 71   Heart disease Father 42       MI   COPD Father    Heart disease Brother    Mental illness Brother    Cancer Brother 106       Lung and bone  Cancer Brother        Lung and bone   Cancer Brother        Colon   Breast cancer Maternal Aunt    Breast cancer Cousin     Social History Social History   Tobacco Use   Smoking status: Never Smoker   Smokeless tobacco: Never Used  Substance Use Topics   Alcohol use: No   Drug use: No    Review of Systems Constitutional: No fever/chills Eyes: No visual changes. ENT: No sore throat. Cardiovascular: Denies chest pain.  Palpitations or the sensation of the heart "jumping around" Respiratory: Denies shortness of breath. Gastrointestinal: No abdominal pain.  No nausea, no vomiting.  No diarrhea.  No constipation. Genitourinary: Negative for dysuria. Musculoskeletal: Negative for neck pain.  Negative for back pain. Integumentary: Negative for rash. Neurological: Negative for headaches, focal weakness or numbness.   ____________________________________________   PHYSICAL EXAM:  VITAL SIGNS: ED Triage Vitals  Enc Vitals Group     BP --      Pulse Rate 04/21/18 2031 81     Resp 04/21/18 2031 16     Temp 04/21/18 2031 98 F (36.7 C)     Temp Source 04/21/18 2031 Oral     SpO2 04/21/18 2031 96 %     Weight 04/21/18 2032 75.8 kg (167 lb)     Height 04/21/18 2032 1.524 m (5')     Head Circumference --      Peak Flow --      Pain Score 04/21/18 2032 0     Pain Loc --      Pain Edu? --      Excl. in [place]? --     Constitutional: Alert and oriented. Well  appearing and in no acute distress. Eyes: Conjunctivae are normal.  Head: Atraumatic. Nose: No congestion/rhinnorhea. Mouth/Throat: Mucous membranes are moist. Neck: No stridor.  No meningeal signs.   Cardiovascular: Normal rate, regular rhythm. Good peripheral circulation. Grossly normal heart sounds. Respiratory: Normal respiratory effort.  No retractions. Lungs CTAB. Gastrointestinal: Soft and nontender. No distention.  Musculoskeletal: No lower extremity tenderness nor edema. No gross deformities of extremities. Neurologic:  Normal speech and language. No gross focal neurologic deficits are appreciated.  Skin:  Skin is warm, dry and intact. No rash noted. Psychiatric: Mood and affect are normal. Speech and behavior are normal.  ____________________________________________   LABS (all labs ordered are listed, but only abnormal results are displayed)  Labs Reviewed  BASIC METABOLIC PANEL - Abnormal; Notable for the following components:      Result Value   Glucose, Bld 130 (*)    Calcium 8.8 (*)    Anion gap 4 (*)    All other components within normal limits  URINALYSIS, COMPLETE (UACMP) WITH MICROSCOPIC - Abnormal; Notable for the following components:   Color, Urine YELLOW (*)    APPearance CLEAR (*)    Leukocytes,Ua TRACE (*)    Bacteria, UA RARE (*)    All other components within normal limits  CBC  TROPONIN I  HEPATIC FUNCTION PANEL  LIPASE, BLOOD  MAGNESIUM  CBG MONITORING, ED   ____________________________________________  EKG  ED ECG REPORT I, Hinda Kehr, the attending physician, personally viewed and interpreted this ECG.  Date: 04/21/2018 EKG Time: 20:26 Rate: 87 Rhythm: normal sinus rhythm with occasional premature atrial complexes QRS Axis: normal Intervals: normal ST/T Wave abnormalities: No concerning abnormalities in the T waves or ST segments. Narrative Interpretation: no definitive evidence of acute  ischemia; does not meet STEMI  criteria.   ____________________________________________  RADIOLOGY   ED MD interpretation:  No indication for imaging  Official radiology report(s): No results found.  ____________________________________________   PROCEDURES   Procedure(s) performed (including Critical Care):  Procedures   ____________________________________________   INITIAL IMPRESSION / MDM / [place] / ED COURSE  As part of my medical decision making, I reviewed the following data within the [place] notes reviewed and incorporated, Labs reviewed , EKG interpreted , Old chart reviewed and Notes from prior ED visits  Pamela Daniel was evaluated in Emergency Department on 04/21/2018 for the symptoms described in the history of present illness. She was evaluated in the context of the global COVID-19 pandemic, which necessitated consideration that the patient might be at risk for infection with the SARS-CoV-2 virus that causes COVID-19. Institutional protocols and algorithms that pertain to the evaluation of patients at risk for COVID-19 are in a state of rapid change based on information released by regulatory bodies including the CDC and federal and state organizations. These policies and algorithms were followed during the patient's care in the ED.      Differential diagnosis includes, but is not limited to, PSVT, PVCs, premature atrial complexes, potentially life-threatening arrhythmias including but not limited to V. tach or A. fib, sinus arrhythmia, vasovagal episode, heart block.  The patient is well-appearing and in no distress.  EKG is reassuring with some PACs but otherwise reassuring.  No evidence of heart block.  Lab work is within normal limits including troponin, electrolytes's, hepatic function panel, lipase.  She has had no urinary symptoms but has a few white blood cells in the urine with some rare bacteria.  I think this is likely asymptomatic bacteriuria  and not representative of UTI but given the leukocytes and rare bacteria I will treat empirically with 1 dose of fosfomycin 3 g IV which should be sufficient for a mild UTI.  I provided some reassurance but explained that I do recommend that she follow-up with cardiologist the next available opportunity.  At the time I am completing the note there was no blood pressure recorded in the system but I saw multiple blood pressure measurements while I was in the room and her blood pressure ranged between 401 systolic and 027 systolic and the diastolic was typically between the 70s and 80s.  I explained that there is no indication for medication at this time but she should follow-up with cardiology or her primary care doctor and discuss perhaps getting a Holter monitor.  She states that she understands and I gave my usual customary return precautions.  There is no indication for repeat troponin; her symptoms have been going on for a couple of days and her episode tonight was more than 4 hours ago when she is low risk for ACS.  Wells score for PE is zero.     ____________________________________________  FINAL CLINICAL IMPRESSION(S) / ED DIAGNOSES  Final diagnoses:  Near syncope  Palpitations     MEDICATIONS GIVEN DURING THIS VISIT:  Medications - No data to display   ED Discharge Orders    None       Note:  This document was prepared using Dragon voice recognition software and may include unintentional dictation errors.   Hinda Kehr, MD 04/21/18 [phone]

## 2018-04-22 MED ORDER — FOSFOMYCIN TROMETHAMINE 3 G PO PACK
3.0000 g | PACK | Freq: Once | ORAL | Status: AC
  Administered 2018-04-22: 3 g via ORAL
  Filled 2018-04-22: qty 3

## 2018-04-30 ENCOUNTER — Emergency Department (HOSPITAL_COMMUNITY): Payer: Medicare HMO

## 2018-04-30 ENCOUNTER — Emergency Department (HOSPITAL_COMMUNITY)
Admission: EM | Admit: 2018-04-30 | Discharge: 2018-04-30 | Disposition: A | Discharge status: Home / Self Care | Payer: Medicare HMO | Attending: Emergency Medicine | Admitting: Emergency Medicine

## 2018-04-30 ENCOUNTER — Other Ambulatory Visit: Payer: Self-pay

## 2018-04-30 DIAGNOSIS — J45909 Unspecified asthma, uncomplicated: Secondary | ICD-10-CM | POA: Diagnosis not present

## 2018-04-30 DIAGNOSIS — I10 Essential (primary) hypertension: Secondary | ICD-10-CM | POA: Insufficient documentation

## 2018-04-30 DIAGNOSIS — R0602 Shortness of breath: Secondary | ICD-10-CM | POA: Insufficient documentation

## 2018-04-30 DIAGNOSIS — Z7982 Long term (current) use of aspirin: Secondary | ICD-10-CM | POA: Insufficient documentation

## 2018-04-30 DIAGNOSIS — Z79899 Other long term (current) drug therapy: Secondary | ICD-10-CM | POA: Insufficient documentation

## 2018-04-30 LAB — CBC WITH DIFFERENTIAL/PLATELET
Abs Immature Granulocytes: 0.01 10*3/uL (ref 0.00–0.07)
Basophils Absolute: 0.1 10*3/uL (ref 0.0–0.1)
Basophils Relative: 1 %
Eosinophils Absolute: 0.3 10*3/uL (ref 0.0–0.5)
Eosinophils Relative: 4 %
HCT: 45.7 % (ref 36.0–46.0)
Hemoglobin: 14.6 g/dL (ref 12.0–15.0)
Immature Granulocytes: 0 %
Lymphocytes Relative: 26 %
Lymphs Abs: 1.8 10*3/uL (ref 0.7–4.0)
MCH: 30.3 pg (ref 26.0–34.0)
MCHC: 31.9 g/dL (ref 30.0–36.0)
MCV: 94.8 fL (ref 80.0–100.0)
Monocytes Absolute: 0.8 10*3/uL (ref 0.1–1.0)
Monocytes Relative: 11 %
Neutro Abs: 4 10*3/uL (ref 1.7–7.7)
Neutrophils Relative %: 58 %
Platelets: 257 10*3/uL (ref 150–400)
RBC: 4.82 MIL/uL (ref 3.87–5.11)
RDW: 12.9 % (ref 11.5–15.5)
WBC: 7 10*3/uL (ref 4.0–10.5)
nRBC: 0 % (ref 0.0–0.2)

## 2018-04-30 LAB — TROPONIN I: Troponin I: <0.03 ng/mL (ref <0.03)

## 2018-04-30 LAB — D-DIMER, QUANTITATIVE: D-Dimer, Quant: 0.47 ug/mL-FEU (ref 0.00–0.50)

## 2018-04-30 LAB — BASIC METABOLIC PANEL
Anion gap: 11 (ref 5–15)
BUN: 10 mg/dL (ref 8–23)
CO2: 23 mmol/L (ref 22–32)
Calcium: 9.2 mg/dL (ref 8.9–10.3)
Chloride: 105 mmol/L (ref 98–111)
Creatinine, Ser: 0.74 mg/dL (ref 0.44–1.00)
GFR calc Af Amer: >60 mL/min (ref >60)
GFR calc non Af Amer: >60 mL/min (ref >60)
Glucose, Bld: 73 mg/dL (ref 70–99)
Potassium: 3.9 mmol/L (ref 3.5–5.1)
Sodium: 139 mmol/L (ref 135–145)

## 2018-04-30 LAB — BRAIN NATRIURETIC PEPTIDE: B Natriuretic Peptide: 59 pg/mL (ref 0.0–100.0)

## 2018-04-30 IMAGING — CR CHEST - 2 VIEW
2 series · 2 of 2 positions shown · non-contrast
Comparison: Chest x-rays dated [DATE] and [DATE].

CLINICAL DATA: Hypertension and shortness of breath with exertion
for 3 weeks.

EXAM:
CHEST - 2 VIEW

[chest pa]
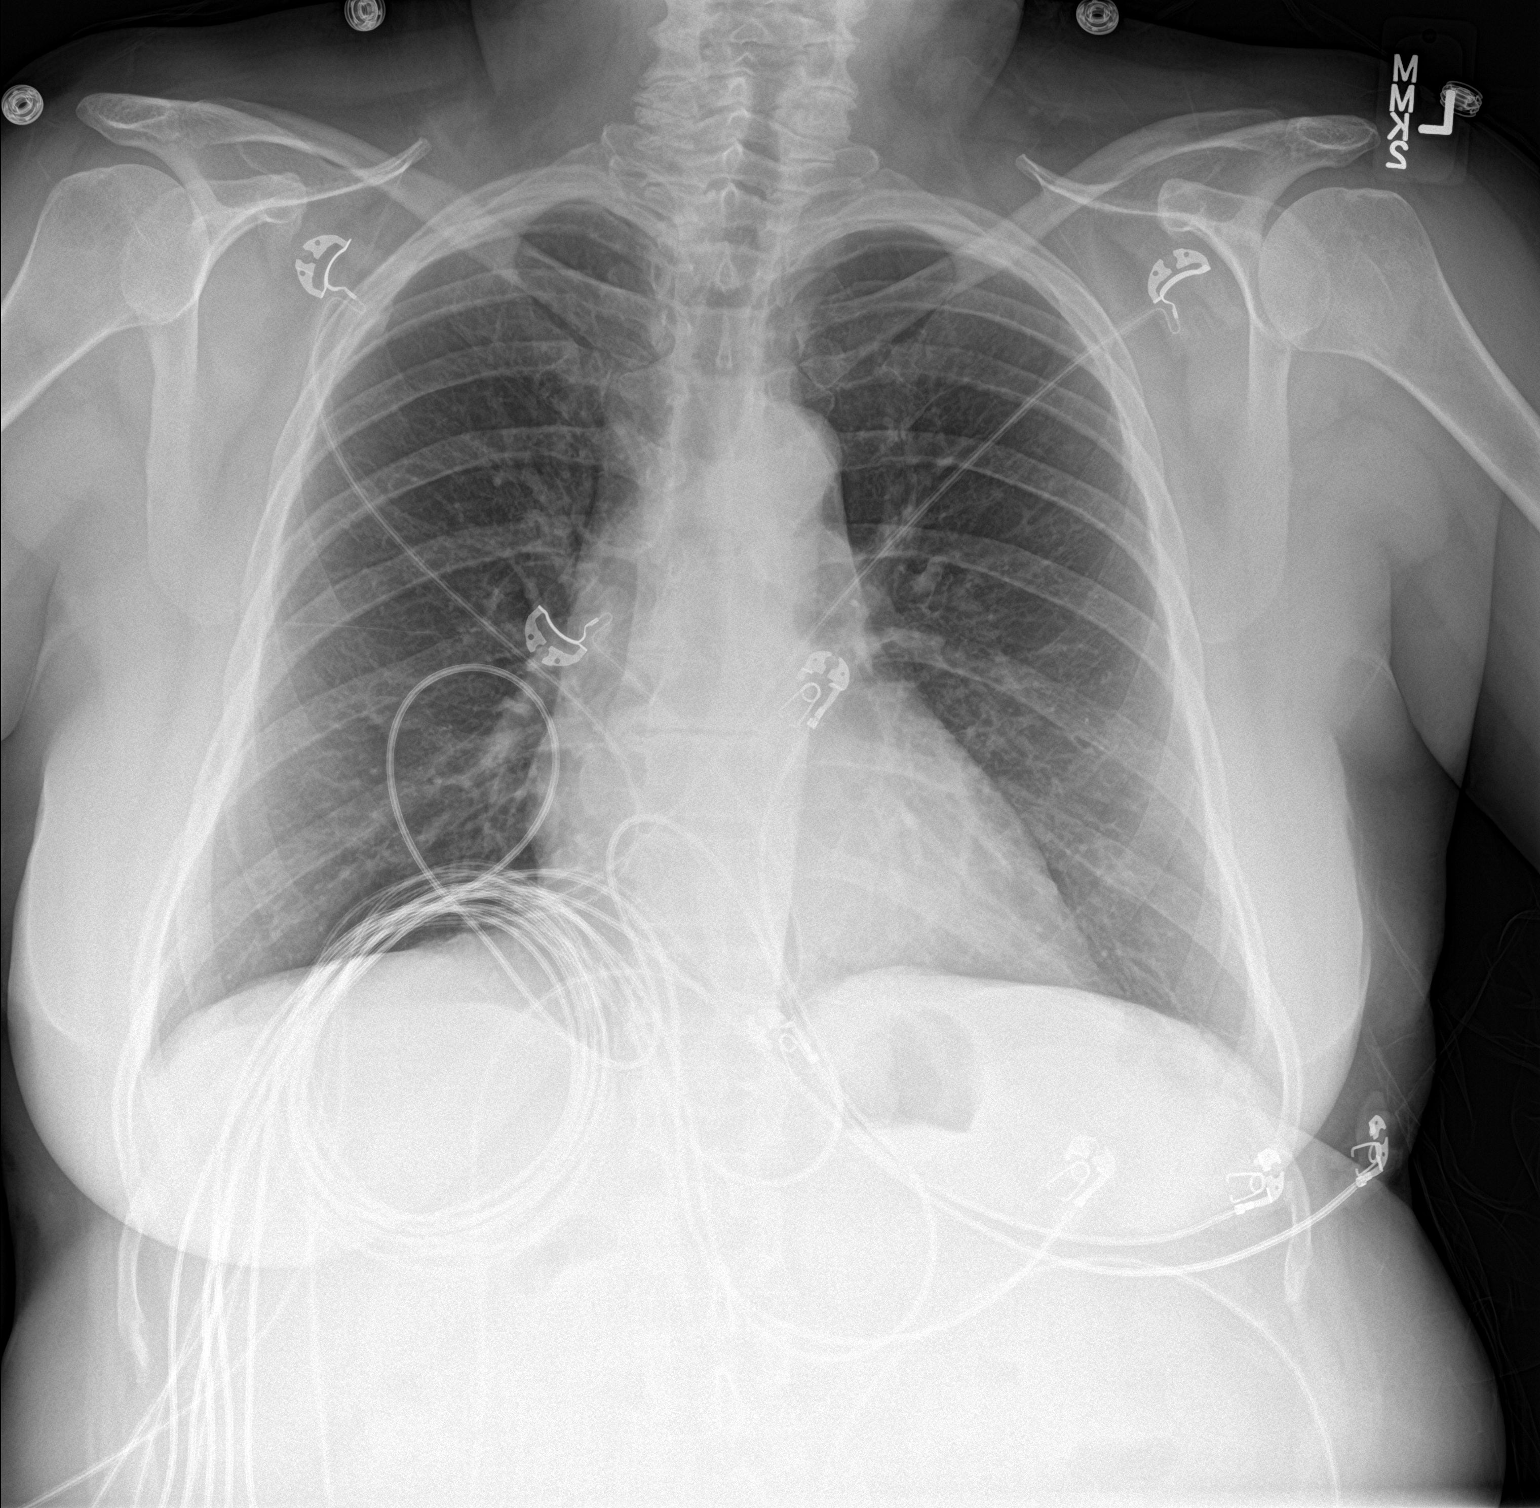

[chest lat]
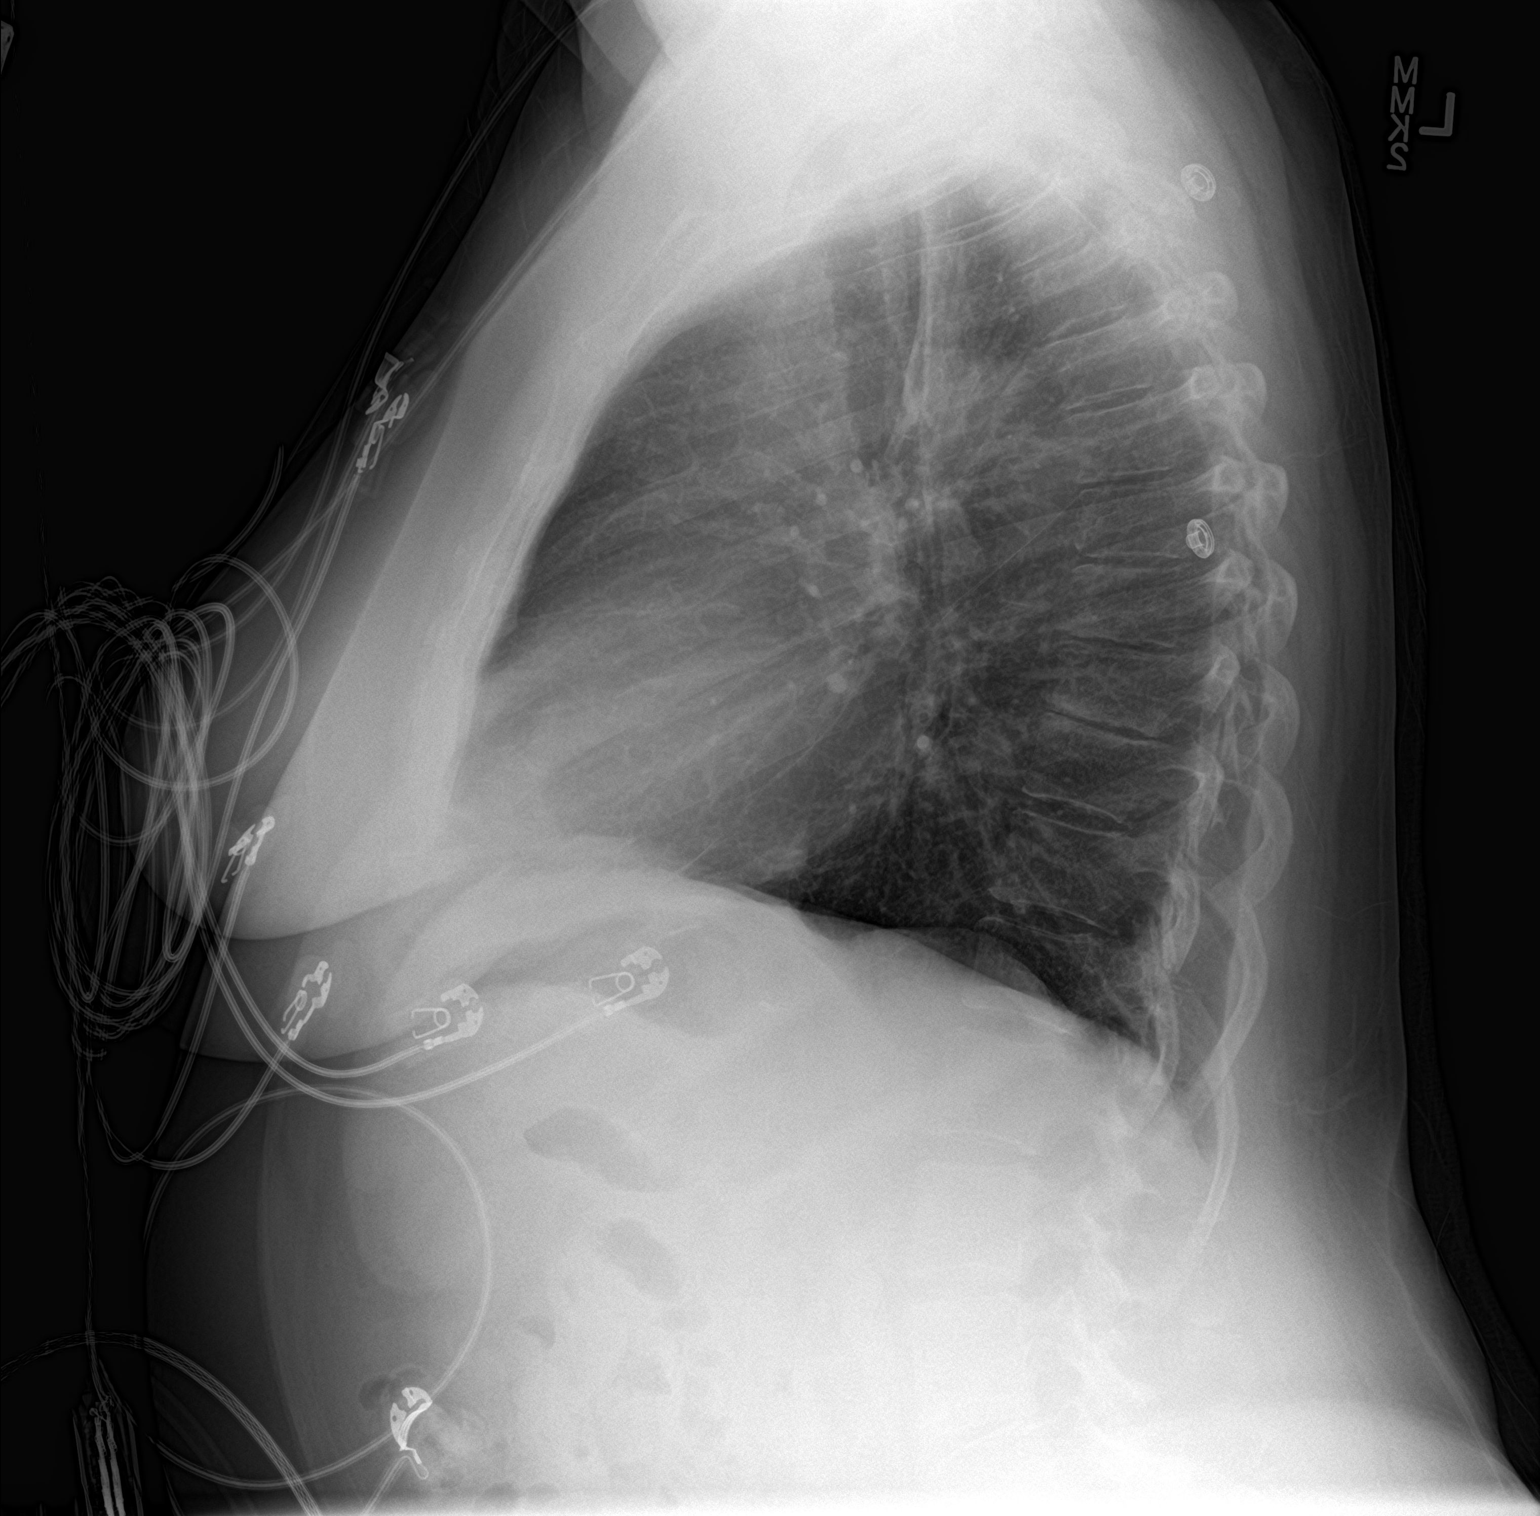

[2 of 2 positions shown; findings below may reference images not displayed]

FINDINGS: Heart size and mediastinal contours are within normal limits. Lungs
are clear. No pleural effusion or pneumothorax seen. Osseous
structures about the chest are unremarkable.
IMPRESSION: No active cardiopulmonary disease. No evidence of pneumonia or
pulmonary edema.

## 2018-04-30 MED ORDER — AMLODIPINE BESYLATE 2.5 MG PO TABS: 2.5000 mg | ORAL_TABLET | Freq: Every day | ORAL | 0 refills | Status: DC

## 2018-04-30 NOTE — ED Notes (Signed)
Patient verbalizes understanding of discharge instructions. Opportunity for questioning and answers were provided. Armband removed by staff, pt discharged from ED.  

## 2018-04-30 NOTE — ED Triage Notes (Signed)
Pt here for hypertension and sob with exertion x 3 weeks. Doesn't take meds for high bp.  Denies chest pain, cough, or fever.

## 2018-04-30 NOTE — ED Provider Notes (Signed)
[facility] EMERGENCY DEPARTMENT Provider Note   CSN: [id number] Arrival date & time: 04/30/18  1238    History   Chief Complaint Chief Complaint  Patient presents with  . Shortness of Breath  . Hypertension    HPI Pamela Daniel is a 68 y.o. female.     The history is provided by the patient and medical records. No language interpreter was used.  Shortness of Breath  Hypertension  Associated symptoms include shortness of breath.   Pamela Daniel is a 68 y.o. female who presents to the Emergency Department complaining of sob. She presents to the emergency department for evaluation of shortness of breath and hypertension. She states that she has hypertension but is not on any current medications. She has been taking her blood pressure multiple times at home and noticed that the blood pressures have gotten up to 323 systolic. On review of her blood pressure log blood pressures range from 140s to 170 is predominantly an average out to be in the 557 systolic. She states her blood pressure is worse when she is performing activity and goes down to near normal when she is sitting in a chair. She denies any fevers but she feels flushed in her head at times. She also has shortness of breath and dyspnea on exertion. Her shortness of breath is been present for the last three weeks. She also feels of fluttering in her chest at times. She denies any cough, nausea, vomiting, abdominal pain. She has chronic lower extremity edema and this is unchanged from her baseline. No known sick contacts. She has been attempting to follow-up with cardiology but has been unable to make an appointment due to the [place] pandemic. Past Medical History:  Diagnosis Date  . Anxiety   . Arthritis    SPINE  . Asthma   . First degree heart block   . Frequency of urination   . Hemorrhoids   . Hiatal hernia    POST RESIDUAL SMALL HH PER IMAGING  . History of acute pancreatitis 03/16/2015  .  History of adenomatous polyp of colon    tubular adenoma's 04-08-2014/   hyperplastic bening polyp 2000  . History of concussion 2012   no residual  . History of transient ischemic attack (TIA) 12/22/2011   no residual  . History of vaginal dysplasia 2016  . Hypertension   . Pseudocyst of pancreas   . PSVT (paroxysmal supraventricular tachycardia) ([place])    cardioloigst-  dr Einar Gip--- last visit 2013 per pt and is currently followed by pcp  . Rectal bleeding   . S/P AV (atrioventricular) nodal ablation 01-21-2010    dr Caryl Comes  . Simple renal cyst    bilateral per imaging  . Urgency of urination     Patient Active Problem List   Diagnosis Date Noted  . Acute pancreatitis   . Cough   . Pancreatitis, acute   . Epigastric abdominal pain   . Pancreatitis 03/16/2015  . VAIN I (vaginal intraepithelial neoplasia grade I) 04/17/2014  . TIA (transient ischemic attack) 12/22/2011  . SVT/ PSVT/ PAT 01/13/2010  . RECTAL BLEEDING 08/01/2007  . DIARRHEA 08/01/2007  . ESOPHAGEAL STRICTURE 07/31/2007  . HIATAL HERNIA 07/31/2007  . COLONIC POLYPS, HX OF 07/31/2007  . HYPERTENSION 03/14/2007  . G E R D 03/14/2007    Past Surgical History:  Procedure Laterality Date  . BREAST EXCISIONAL BIOPSY Right   . CARDIAC ELECTROPHYSIOLOGY MAPPING AND ABLATION  01-21-2010   dr Caryl Comes  ablation AVNRT  . FOOT NEUROMA SURGERY Left 1996  . HEMORRHOID SURGERY N/A 10/28/2016   Procedure: HEMORRHOIDECTOMY AND HEMORRHOIDAL PEXY;  Surgeon: Leighton Ruff, MD;  Location: [facility];  Service: General;  Laterality: N/A;  . LAPAROSCOPY TAKEDOWN AND REPAIR HIATAL HERNIA /  NISSEN FUNDOPLATION  09-01-2005    dr Hassell Done  [facility]  . NASAL SEPTUM SURGERY  1991  . TEE WITHOUT CARDIOVERSION  02/15/2012   Procedure: TRANSESOPHAGEAL ECHOCARDIOGRAM (TEE);  Surgeon: Laverda Page, MD;  Location: [facility] ENDOSCOPY;  Service: Cardiovascular;  Laterality: N/A;  normal LV, normal EF, mild MR, trace TR, trace PI  .  TRANSTHORACIC ECHOCARDIOGRAM  12-22-2011    dr Einar Gip   normal echo  . VAGINAL HYSTERECTOMY  1996   w/  BSO     OB History    Gravida  4   Para  4   Term      Preterm  1   AB      Living  3     SAB      TAB      Ectopic      Multiple      Live Births               Home Medications    Prior to Admission medications   Medication Sig Start Date End Date Taking? Authorizing Provider  acetaminophen (TYLENOL) 325 MG tablet Take 650 mg by mouth every 6 (six) hours as needed for mild pain.    [provider]  albuterol (PROVENTIL) (2.5 MG/3ML) 0.083% nebulizer solution Take 6 mLs (5 mg total) by nebulization once. 04/06/15   Margaretann Loveless, MD  albuterol (PROVENTIL) (2.5 MG/3ML) 0.083% nebulizer solution Take 3 mLs (2.5 mg total) by nebulization every 6 (six) hours as needed for wheezing or shortness of breath. 02/28/18   Tasia Catchings, Amy V, PA-C  ALPRAZolam Duanne Moron) 0.25 MG tablet Take 0.25 mg by mouth at bedtime as needed for anxiety.    [provider]  amLODipine (NORVASC) 2.5 MG tablet Take 1 tablet (2.5 mg total) by mouth daily. 04/30/18   Quintella Reichert, MD  aspirin 325 MG tablet Take 325 mg by mouth daily.    [provider]  calcium-vitamin D (OSCAL WITH D) 500-200 MG-UNIT tablet Take 1 tablet by mouth.    [provider]  Cholecalciferol (VITAMIN D3) 125 MCG (5000 UT) CAPS Take by mouth.    [provider]  doxycycline (VIBRAMYCIN) 100 MG capsule Take 1 capsule (100 mg total) by mouth 2 (two) times daily. 02/28/18   Tasia Catchings, Amy V, PA-C  fluticasone (FLONASE) 50 MCG/ACT nasal spray Place 1 spray into both nostrils as needed.  11/17/16   [provider]  ibuprofen (ADVIL,MOTRIN) 200 MG tablet Take 400 mg by mouth every 6 (six) hours.    [provider]  ipratropium (ATROVENT) 0.06 % nasal spray Place 2 sprays into both nostrils 4 (four) times daily. 02/28/18   Tasia Catchings, Amy V, PA-C  triamcinolone cream (KENALOG) 0.1 % Apply 1  application topically 2 (two) times daily as needed.     [provider]    Family History Family History  Problem Relation Age of Onset  . Heart disease Mother 9       Heart failure >> death at age 37  . Dementia Mother   . Hypertension Mother   . Cancer Mother        Breast and colon  . Breast cancer Mother 21  .  Heart disease Father 25       MI  . COPD Father   . Heart disease Brother   . Mental illness Brother   . Cancer Brother 62       Lung and bone  . Cancer Brother        Lung and bone  . Cancer Brother        Colon  . Breast cancer Maternal Aunt   . Breast cancer Cousin     Social History Social History   Tobacco Use  . Smoking status: Never Smoker  . Smokeless tobacco: Never Used  Substance Use Topics  . Alcohol use: No  . Drug use: No     Allergies   Patient has no known allergies.   Review of Systems Review of Systems  Respiratory: Positive for shortness of breath.   All other systems reviewed and are negative.    Physical Exam Updated Vital Signs BP 122/60   Pulse 69   Temp 98.1 F (36.7 C) (Oral)   Resp 16   Ht 5' (1.524 m)   Wt 75.8 kg   SpO2 97%   BMI 32.62 kg/m   Physical Exam Vitals signs and nursing note reviewed.  Constitutional:      Appearance: She is well-developed.  HENT:     Head: Normocephalic and atraumatic.  Cardiovascular:     Rate and Rhythm: Normal rate and regular rhythm.     Heart sounds: No murmur.  Pulmonary:     Effort: Pulmonary effort is normal. No respiratory distress.     Breath sounds: Normal breath sounds.  Abdominal:     Palpations: Abdomen is soft.     Tenderness: There is no abdominal tenderness. There is no guarding or rebound.  Musculoskeletal:        General: No swelling or tenderness.  Skin:    General: Skin is warm and dry.     Capillary Refill: Capillary refill takes less than 2 seconds.  Neurological:     Mental Status: She is alert and oriented to person, place, and  time.  Psychiatric:        Behavior: Behavior normal.      ED Treatments / Results  Labs (all labs ordered are listed, but only abnormal results are displayed) Labs Reviewed  BASIC METABOLIC PANEL  BRAIN NATRIURETIC PEPTIDE  TROPONIN I  CBC WITH DIFFERENTIAL/PLATELET  D-DIMER, QUANTITATIVE (NOT AT [facility])    EKG EKG Interpretation  Date/Time:  Sunday April 30 2018 12:48:18 EDT Ventricular Rate:  69 PR Interval:    QRS Duration: 72 QT Interval:  389 QTC Calculation: 417 R Axis:   45 Text Interpretation:  Sinus rhythm Low voltage, precordial leads Confirmed by Quintella Reichert [phone]) on 04/30/2018 1:00:40 PM   Radiology Dg Chest 2 View  Result Date: 04/30/2018 CLINICAL DATA:  Hypertension and shortness of breath with exertion for 3 weeks. EXAM: CHEST - 2 VIEW COMPARISON:  Chest x-rays dated 10/09/2017 and 06/30/2016. FINDINGS: Heart size and mediastinal contours are within normal limits. Lungs are clear. No pleural effusion or pneumothorax seen. Osseous structures about the chest are unremarkable. IMPRESSION: No active cardiopulmonary disease. No evidence of pneumonia or pulmonary edema. Electronically Signed   By: Franki Cabot M.D.   On: 04/30/2018 13:42    Procedures Procedures (including critical care time)  Medications Ordered in ED Medications - No data to display   Initial Impression / Assessment and Plan / ED Course  I have reviewed the triage vital  signs and the nursing notes.  Pertinent labs & imaging results that were available during my care of the patient were reviewed by me and considered in my medical decision making (see chart for details).        Patient presents to the emergency department complaining of elevated blood pressure as well as shortness of breath. She is well appearing on evaluation in no acute distress. Current presentation is not consistent with hypertensive urgency, ACS, PE, dissection, acute CHF. Discussed with patient home care for  elevated blood pressure as well as hypertension. Discussed outpatient follow-up and return precautions. Final Clinical Impressions(s) / ED Diagnoses   Final diagnoses:  Essential hypertension  Shortness of breath    ED Discharge Orders         Ordered    amLODipine (NORVASC) 2.5 MG tablet  Daily     04/30/18 1437           Quintella Reichert, MD 04/30/18 1438

## 2018-05-12 DIAGNOSIS — I10 Essential (primary) hypertension: Secondary | ICD-10-CM | POA: Diagnosis not present

## 2018-05-12 DIAGNOSIS — R0609 Other forms of dyspnea: Secondary | ICD-10-CM | POA: Diagnosis not present

## 2018-05-12 DIAGNOSIS — I471 Supraventricular tachycardia: Secondary | ICD-10-CM | POA: Diagnosis not present

## 2018-05-26 DIAGNOSIS — I471 Supraventricular tachycardia: Secondary | ICD-10-CM | POA: Diagnosis not present

## 2018-05-26 DIAGNOSIS — R0609 Other forms of dyspnea: Secondary | ICD-10-CM | POA: Diagnosis not present

## 2018-05-26 DIAGNOSIS — I10 Essential (primary) hypertension: Secondary | ICD-10-CM | POA: Diagnosis not present

## 2018-08-14 DIAGNOSIS — J3489 Other specified disorders of nose and nasal sinuses: Secondary | ICD-10-CM | POA: Diagnosis not present

## 2018-08-14 DIAGNOSIS — R05 Cough: Secondary | ICD-10-CM | POA: Diagnosis not present

## 2018-08-14 DIAGNOSIS — Z20828 Contact with and (suspected) exposure to other viral communicable diseases: Secondary | ICD-10-CM | POA: Diagnosis not present

## 2018-10-27 DIAGNOSIS — R1032 Left lower quadrant pain: Secondary | ICD-10-CM | POA: Diagnosis not present

## 2018-10-27 DIAGNOSIS — K635 Polyp of colon: Secondary | ICD-10-CM | POA: Diagnosis not present

## 2018-10-27 DIAGNOSIS — K5792 Diverticulitis of intestine, part unspecified, without perforation or abscess without bleeding: Secondary | ICD-10-CM | POA: Diagnosis not present

## 2018-10-27 DIAGNOSIS — F419 Anxiety disorder, unspecified: Secondary | ICD-10-CM | POA: Diagnosis not present

## 2018-10-27 DIAGNOSIS — R338 Other retention of urine: Secondary | ICD-10-CM | POA: Diagnosis not present

## 2018-10-27 DIAGNOSIS — R350 Frequency of micturition: Secondary | ICD-10-CM | POA: Diagnosis not present

## 2018-11-16 ENCOUNTER — Ambulatory Visit: Payer: Medicare HMO | Admitting: Gastroenterology

## 2018-11-21 DIAGNOSIS — N3944 Nocturnal enuresis: Secondary | ICD-10-CM | POA: Diagnosis not present

## 2018-11-21 DIAGNOSIS — R351 Nocturia: Secondary | ICD-10-CM | POA: Diagnosis not present

## 2018-11-21 DIAGNOSIS — R350 Frequency of micturition: Secondary | ICD-10-CM | POA: Diagnosis not present

## 2018-11-21 DIAGNOSIS — N3946 Mixed incontinence: Secondary | ICD-10-CM | POA: Diagnosis not present

## 2018-12-27 DIAGNOSIS — R350 Frequency of micturition: Secondary | ICD-10-CM | POA: Diagnosis not present

## 2018-12-27 DIAGNOSIS — N3946 Mixed incontinence: Secondary | ICD-10-CM | POA: Diagnosis not present

## 2019-01-16 DIAGNOSIS — R0609 Other forms of dyspnea: Secondary | ICD-10-CM | POA: Diagnosis not present

## 2019-01-16 DIAGNOSIS — K5792 Diverticulitis of intestine, part unspecified, without perforation or abscess without bleeding: Secondary | ICD-10-CM | POA: Diagnosis not present

## 2019-01-16 DIAGNOSIS — I10 Essential (primary) hypertension: Secondary | ICD-10-CM | POA: Diagnosis not present

## 2019-01-16 DIAGNOSIS — M859 Disorder of bone density and structure, unspecified: Secondary | ICD-10-CM | POA: Diagnosis not present

## 2019-01-23 DIAGNOSIS — K219 Gastro-esophageal reflux disease without esophagitis: Secondary | ICD-10-CM | POA: Diagnosis not present

## 2019-01-23 DIAGNOSIS — Z1331 Encounter for screening for depression: Secondary | ICD-10-CM | POA: Diagnosis not present

## 2019-01-23 DIAGNOSIS — F419 Anxiety disorder, unspecified: Secondary | ICD-10-CM | POA: Diagnosis not present

## 2019-01-23 DIAGNOSIS — J45909 Unspecified asthma, uncomplicated: Secondary | ICD-10-CM | POA: Diagnosis not present

## 2019-01-23 DIAGNOSIS — Z1339 Encounter for screening examination for other mental health and behavioral disorders: Secondary | ICD-10-CM | POA: Diagnosis not present

## 2019-01-23 DIAGNOSIS — Z0000 Encounter for general adult medical examination without abnormal findings: Secondary | ICD-10-CM | POA: Diagnosis not present

## 2019-01-23 DIAGNOSIS — I10 Essential (primary) hypertension: Secondary | ICD-10-CM | POA: Diagnosis not present

## 2019-01-23 DIAGNOSIS — M8580 Other specified disorders of bone density and structure, unspecified site: Secondary | ICD-10-CM | POA: Diagnosis not present

## 2019-01-23 DIAGNOSIS — K635 Polyp of colon: Secondary | ICD-10-CM | POA: Diagnosis not present

## 2019-01-23 DIAGNOSIS — E559 Vitamin D deficiency, unspecified: Secondary | ICD-10-CM | POA: Diagnosis not present

## 2019-01-23 DIAGNOSIS — I471 Supraventricular tachycardia: Secondary | ICD-10-CM | POA: Diagnosis not present

## 2019-01-24 ENCOUNTER — Ambulatory Visit
Admission: RE | Admit: 2019-01-24 | Discharge: 2019-01-24 | Disposition: A | Discharge status: Home / Self Care | Payer: Medicare HMO | Source: Ambulatory Visit | Attending: Internal Medicine | Admitting: Internal Medicine

## 2019-01-24 ENCOUNTER — Other Ambulatory Visit: Payer: Self-pay | Admitting: Internal Medicine

## 2019-01-24 DIAGNOSIS — R109 Unspecified abdominal pain: Secondary | ICD-10-CM

## 2019-01-24 DIAGNOSIS — K5730 Diverticulosis of large intestine without perforation or abscess without bleeding: Secondary | ICD-10-CM | POA: Diagnosis not present

## 2019-01-24 MED ORDER — IOPAMIDOL (ISOVUE-300) INJECTION 61%
100.0000 mL | Freq: Once | INTRAVENOUS | Status: AC | PRN
  Administered 2019-01-24: 10:00:00 100 mL via INTRAVENOUS

## 2019-01-31 ENCOUNTER — Encounter: Payer: Self-pay | Admitting: Gastroenterology

## 2019-01-31 ENCOUNTER — Ambulatory Visit: Payer: Medicare HMO | Admitting: Gastroenterology

## 2019-01-31 VITALS — BP 138/80 | HR 64 | Temp 98.1°F | Ht 60.0 in | Wt 170.0 lb

## 2019-01-31 DIAGNOSIS — R140 Abdominal distension (gaseous): Secondary | ICD-10-CM

## 2019-01-31 DIAGNOSIS — Z800 Family history of malignant neoplasm of digestive organs: Secondary | ICD-10-CM

## 2019-01-31 DIAGNOSIS — K862 Cyst of pancreas: Secondary | ICD-10-CM | POA: Diagnosis not present

## 2019-01-31 DIAGNOSIS — Z01818 Encounter for other preprocedural examination: Secondary | ICD-10-CM

## 2019-01-31 MED ORDER — NA SULFATE-K SULFATE-MG SULF 17.5-3.13-1.6 GM/177ML PO SOLN: 1.0000 | Freq: Once | ORAL | 0 refills | Status: AC

## 2019-01-31 NOTE — Progress Notes (Signed)
[facility] Progress Note  Chief Complaint: Generalized abdominal pain  Subjective  History: Last seen August 2018 for grade 3 internal hemorrhoids with bleeding, was referred to colorectal surgery.  She had hemorrhoidectomy by Dr. Marcello Moores with resolution of symptoms.  Ariahna was referred back by primary care for abdominal bloating and distention. For about the last 3 months she has had frequent abdominal loading where she becomes visibly distended and uncomfortable, sometimes occurs after meals, or when she has been up and around active in the house such as doing laundry or cleaning.  Abdomen become hard and uncomfortable particularly bilateral lower abdomen. She has had no change in bowel habits, and has a regular BM 1 or 2 times a day without the need to strain.  She has not had excess gas production.  She will occasionally have nausea if her stomach is distended like this, but denies vomiting dysphagia anorexia or weight loss.  Jarrod told me she had been under significantly increased stress in the last several months as her daughter has been struggling with some mental health issues. Her greatest concern related to the symptoms was that he has a family history of colon cancer in mother and a sibling.  Her last colonoscopy with 2 adenomatous and several hyperplastic polyps was in March 2016 by Dr. Deatra Ina. CT scan abdomen recently ordered by primary care with report as noted below.  ROS: Cardiovascular:  no chest pain Respiratory: no dyspnea Anxiety Arthralgias Remainder of systems negative except as above The patient's Past Medical, Family and Social History were reviewed and are on file in the EMR. Past Medical History:  Diagnosis Date  . Anxiety   . Arthritis    SPINE  . Asthma   . Fatty liver   . Fatty pancreas   . First degree heart block   . Frequency of urination   . GERD (gastroesophageal reflux disease)   . Hemorrhoids   . Hepatic steatosis   . Hiatal  hernia    POST RESIDUAL SMALL HH PER IMAGING  . History of acute pancreatitis 03/16/2015  . History of adenomatous polyp of colon    tubular adenoma's 04-08-2014/   hyperplastic bening polyp 2000  . History of concussion 2012   no residual  . History of transient ischemic attack (TIA) 12/22/2011   no residual  . History of vaginal dysplasia 2016  . Hypertension   . Pancreatic cyst   . Pseudocyst of pancreas   . PSVT (paroxysmal supraventricular tachycardia) ([place])    cardioloigst-  dr Einar Gip--- last visit 2013 per pt and is currently followed by pcp  . Rectal bleeding   . S/P AV (atrioventricular) nodal ablation 01-21-2010    dr Caryl Comes  . Simple renal cyst    bilateral per imaging  . Urgency of urination   . Urinary frequency   . Urinary leakage    Past Surgical History:  Procedure Laterality Date  . BREAST EXCISIONAL BIOPSY Right   . CARDIAC ELECTROPHYSIOLOGY MAPPING AND ABLATION  01-21-2010   dr Caryl Comes   ablation AVNRT  . FOOT NEUROMA SURGERY Left 1996  . HEMORRHOID SURGERY N/A 10/28/2016   Procedure: HEMORRHOIDECTOMY AND HEMORRHOIDAL PEXY;  Surgeon: Leighton Ruff, MD;  Location: [facility];  Service: General;  Laterality: N/A;  . LAPAROSCOPY TAKEDOWN AND REPAIR HIATAL HERNIA /  NISSEN FUNDOPLATION  09-01-2005    dr Hassell Done  [facility]  . NASAL SEPTUM SURGERY  1991  . TEE WITHOUT CARDIOVERSION  02/15/2012  Procedure: TRANSESOPHAGEAL ECHOCARDIOGRAM (TEE);  Surgeon: Laverda Page, MD;  Location: [facility] ENDOSCOPY;  Service: Cardiovascular;  Laterality: N/A;  normal LV, normal EF, mild MR, trace TR, trace PI  . TRANSTHORACIC ECHOCARDIOGRAM  12-22-2011    dr Einar Gip   normal echo  . VAGINAL HYSTERECTOMY  1996   w/  BSO   Objective:  Med list reviewed  Current Outpatient Medications:  .  acetaminophen (TYLENOL) 325 MG tablet, Take 650 mg by mouth every 6 (six) hours as needed for mild pain., Disp: , Rfl:  .  albuterol (PROVENTIL) (2.5 MG/3ML) 0.083% nebulizer solution,  Take 6 mLs (5 mg total) by nebulization once., Disp: 75 mL, Rfl: 12 .  albuterol (PROVENTIL) (2.5 MG/3ML) 0.083% nebulizer solution, Take 3 mLs (2.5 mg total) by nebulization every 6 (six) hours as needed for wheezing or shortness of breath., Disp: 75 mL, Rfl: 0 .  ALPRAZolam (XANAX) 0.25 MG tablet, Take 0.25 mg by mouth at bedtime as needed for anxiety., Disp: , Rfl:  .  aspirin 325 MG tablet, Take 325 mg by mouth daily., Disp: , Rfl:  .  Cholecalciferol (VITAMIN D3) 125 MCG (5000 UT) CAPS, Take by mouth., Disp: , Rfl:  .  ibuprofen (ADVIL,MOTRIN) 200 MG tablet, Take 400 mg by mouth every 6 (six) hours., Disp: , Rfl:  .  ipratropium (ATROVENT) 0.06 % nasal spray, Place 2 sprays into both nostrils 4 (four) times daily., Disp: 15 mL, Rfl: 0 .  Na Sulfate-K Sulfate-Mg Sulf 17.5-3.13-1.6 GM/177ML SOLN, Take 1 kit by mouth once for 1 dose., Disp: 354 mL, Rfl: 0   Vital signs in last 24 hrs: Vitals:   01/31/19 1531  BP: 138/80  Pulse: 64  Temp: 98.1 F (36.7 C)    Physical Exam  She is well-appearing  HEENT: sclera anicteric, oral mucosa moist without lesions  Neck: supple, no thyromegaly, JVD or lymphadenopathy  Cardiac: RRR without murmurs, S1S2 heard, no peripheral edema  Pulm: clear to auscultation bilaterally, normal RR and effort noted  Abdomen: soft, mild bilateral lower tenderness, with active bowel sounds. No guarding or palpable hepatosplenomegaly.  Not distended (says it is not as bad today)  Skin; warm and dry, no jaundice or rash  Recent Labs:  Recent primary care labs: Normal CMP (albumin 3.4) , normal CBC nml TSH  Radiologic studies:  CLINICAL DATA:  Swelling, mid to lower abdominal spasms, cramping left lower quadrant with tenderness, past history of hysterectomy, Nissen fundoplication, gerd, hypertension, pancreatic pseudocyst   EXAM: CT ABDOMEN AND PELVIS WITH CONTRAST   TECHNIQUE: Multidetector CT imaging of the abdomen and pelvis was performed using  the standard protocol following bolus administration of intravenous contrast. Sagittal and coronal MPR images reconstructed from axial data set.   CONTRAST:  134m ISOVUE-300 IOPAMIDOL (ISOVUE-300) INJECTION 61% IV. No oral contrast.   COMPARISON:  09/11/2016   FINDINGS: Lower chest: Lung bases clear.   Hepatobiliary: Fatty infiltration of liver. Gallbladder and liver otherwise normal appearance   Pancreas: Partial fatty replacement of pancreas. Small cystic lesions at the pancreatic head, 8 x 7 mm decreased from 13 x 11 mm and 11 x 11 mm minimally increased from 8 x 7 mm. Remainder of pancreas unremarkable.   Spleen: Normal appearance   Adrenals/Urinary Tract: Adrenal glands, kidneys, ureters, and bladder normal appearance   Stomach/Bowel: Normal appendix. Scattered colonic diverticula without evidence of diverticulitis. Prior Nissen fundoplication without obvious recurrent hiatal hernia. Stomach and bowel loops otherwise unremarkable   Vascular/Lymphatic: Atherosclerotic calcifications aorta and  iliac arteries without aneurysm. Circumaortic LEFT renal vein. No adenopathy.   Reproductive: Uterus surgically absent with nonvisualization of ovaries   Other: No free air or free fluid. No definite hernia or acute inflammatory process.   Musculoskeletal: Grade 1 spondylolisthesis L4-L5 secondary to degenerative disc and facet disease   IMPRESSION: Fatty infiltration of liver.   Small cystic lesions at pancreatic head, 1 slightly decreased in size and second minimally increased since 2018.   Colonic diverticulosis without evidence of diverticulitis.   No acute intra-abdominal or intrapelvic abnormalities.   Aortic Atherosclerosis (ICD10-I70.0).     Electronically Signed   By: Lavonia Dana M.D.   On: 01/24/2019 10:53   _0 @ Assessment: Encounter Diagnoses  Name Primary?  . Abdominal bloating Yes  . Family history of colon cancer   .  Preprocedural examination   . Pancreatic cyst    This appears to be functional bloating, perhaps triggered by recent stressors.  CT scan reassuring with no evidence of neoplasia, obstruction or ascites. Stable benign-appearing pancreatic cyst found incidentally.  I reassured her there is no evidence of colon cancer.  I do not think antispasmodic medicine will be very helpful for her with the symptoms, especially with her chronic diarrhea.  We gave her some samples of IBgard to try 2 capsules once a day.  Behavioral and perhaps pharmacologic attention to her recent stressors and anxiety would most likely be helpful alleviating the symptoms.  I recommended she address this with primary care.  Joetta is due for a colonoscopy for history of colon polyps and family history colon cancer. She was agreeable after discussion of procedure and risks, and arrangements were made today.  The benefits and risks of the planned procedure were described in detail with the patient or (when appropriate) their health care proxy.  Risks were outlined as including, but not limited to, bleeding, infection, perforation, adverse medication reaction leading to cardiac or pulmonary decompensation, pancreatitis (if ERCP).  The limitation of incomplete mucosal visualization was also discussed.  No guarantees or warranties were given.  Total time 30 minutes, over half spent face-to-face with patient in counseling and coordination of care.   Nelida Meuse III

## 2019-01-31 NOTE — Patient Instructions (Addendum)
If you are age 70 or older, your body mass index should be between 23-30. Your Body mass index is 33.2 kg/m. If this is out of the aforementioned range listed, please consider follow up with your Primary Care Provider.  If you are age 23 or younger, your body mass index should be between 19-25. Your Body mass index is 33.2 kg/m. If this is out of the aformentioned range listed, please consider follow up with your Primary Care Provider.   You have been scheduled for a colonoscopy. Please follow written instructions given to you at your visit today.  Please pick up your prep supplies at the pharmacy within the next 1-3 days. If you use inhalers (even only as needed), please bring them with you on the day of your procedure.  Medication Samples have been provided to the patient.  Drug name: Lenis Noon: [id number]  LOT: [id number]  Exp.Date: 07-2020  Dosing instructions: 2 capsules three times a day 30 min. before meals  The patient has been instructed regarding the correct time, dose, and frequency of taking this medication, including desired effects and most common side effects.   It was a pleasure to see you today!  Dr. Loletha Carrow

## 2019-02-06 ENCOUNTER — Other Ambulatory Visit (HOSPITAL_COMMUNITY): Payer: Medicare HMO

## 2019-02-09 DIAGNOSIS — N3946 Mixed incontinence: Secondary | ICD-10-CM | POA: Diagnosis not present

## 2019-02-09 DIAGNOSIS — R350 Frequency of micturition: Secondary | ICD-10-CM | POA: Diagnosis not present

## 2019-03-05 ENCOUNTER — Encounter: Payer: Self-pay | Admitting: Gastroenterology

## 2019-03-07 ENCOUNTER — Other Ambulatory Visit: Payer: Self-pay | Admitting: Gastroenterology

## 2019-03-07 ENCOUNTER — Ambulatory Visit (INDEPENDENT_AMBULATORY_CARE_PROVIDER_SITE_OTHER): Payer: Medicare HMO

## 2019-03-07 DIAGNOSIS — Z1159 Encounter for screening for other viral diseases: Secondary | ICD-10-CM

## 2019-03-07 LAB — SARS CORONAVIRUS 2 (TAT 6-24 HRS): SARS Coronavirus 2: NEGATIVE

## 2019-03-09 ENCOUNTER — Ambulatory Visit (AMBULATORY_SURGERY_CENTER): Payer: Medicare HMO | Admitting: Gastroenterology

## 2019-03-09 ENCOUNTER — Other Ambulatory Visit: Payer: Self-pay

## 2019-03-09 ENCOUNTER — Encounter: Payer: Medicare HMO | Admitting: Gastroenterology

## 2019-03-09 ENCOUNTER — Encounter: Payer: Self-pay | Admitting: Gastroenterology

## 2019-03-09 VITALS — BP 129/62 | HR 65 | Temp 97.1°F | Resp 13 | Ht 60.0 in | Wt 170.0 lb

## 2019-03-09 DIAGNOSIS — Z800 Family history of malignant neoplasm of digestive organs: Secondary | ICD-10-CM

## 2019-03-09 DIAGNOSIS — D123 Benign neoplasm of transverse colon: Secondary | ICD-10-CM

## 2019-03-09 DIAGNOSIS — D122 Benign neoplasm of ascending colon: Secondary | ICD-10-CM

## 2019-03-09 DIAGNOSIS — R140 Abdominal distension (gaseous): Secondary | ICD-10-CM | POA: Diagnosis not present

## 2019-03-09 MED ORDER — SODIUM CHLORIDE 0.9 % IV SOLN: 500.0000 mL | Freq: Once | INTRAVENOUS | Status: DC

## 2019-03-09 NOTE — Progress Notes (Signed)
Called to room to assist during endoscopic procedure.  Patient ID and intended procedure confirmed with present staff. Received instructions for my participation in the procedure from the performing physician.  

## 2019-03-09 NOTE — Progress Notes (Signed)
A/ox3, pleased with MAC, report to RN 

## 2019-03-09 NOTE — Patient Instructions (Signed)
YOU HAD AN ENDOSCOPIC PROCEDURE TODAY AT [facility]:   Refer to the procedure report that was given to you for any specific questions about what was found during the examination.  If the procedure report does not answer your questions, please call your gastroenterologist to clarify.  If you requested that your care partner not be given the details of your procedure findings, then the procedure report has been included in a sealed envelope for you to review at your convenience later.  **Handouts given on Polyps and diverticulosis**  YOU SHOULD EXPECT: Some feelings of bloating in the abdomen. Passage of more gas than usual.  Walking can help get rid of the air that was put into your GI tract during the procedure and reduce the bloating. If you had a lower endoscopy (such as a colonoscopy or flexible sigmoidoscopy) you may notice spotting of blood in your stool or on the toilet paper. If you underwent a bowel prep for your procedure, you may not have a normal bowel movement for a few days.  Please Note:  You might notice some irritation and congestion in your nose or some drainage.  This is from the oxygen used during your procedure.  There is no need for concern and it should clear up in a day or so.  SYMPTOMS TO REPORT IMMEDIATELY:   Following lower endoscopy (colonoscopy or flexible sigmoidoscopy):  Excessive amounts of blood in the stool  Significant tenderness or worsening of abdominal pains  Swelling of the abdomen that is new, acute  Fever of 100F or higher  For urgent or emergent issues, a gastroenterologist can be reached at any hour by calling [phone].   DIET:  We do recommend a small meal at first, but then you may proceed to your regular diet.  Drink plenty of fluids but you should avoid alcoholic beverages for 24 hours.  ACTIVITY:  You should plan to take it easy for the rest of today and you should NOT DRIVE or use heavy machinery until tomorrow (because of  the sedation medicines used during the test).    FOLLOW UP: Our staff will call the number listed on your records 48-72 hours following your procedure to check on you and address any questions or concerns that you may have regarding the information given to you following your procedure. If we do not reach you, we will leave a message.  We will attempt to reach you two times.  During this call, we will ask if you have developed any symptoms of COVID 19. If you develop any symptoms (ie: fever, flu-like symptoms, shortness of breath, cough etc.) before then, please call [phone].  If you test positive for Covid 19 in the 2 weeks post procedure, please call and report this information to [place].    If any biopsies were taken you will be contacted by phone or by letter within the next 1-3 weeks.  Please call us at [phone] if you have not heard about the biopsies in 3 weeks.    SIGNATURES/CONFIDENTIALITY: You and/or your care partner have signed paperwork which will be entered into your electronic medical record.  These signatures attest to the fact that that the information above on your After Visit Summary has been reviewed and is understood.  Full responsibility of the confidentiality of this discharge information lies with you and/or your care-partner.

## 2019-03-09 NOTE — Op Note (Signed)
[facility] Patient Name: Fatemah Mehrotra Procedure Date: 03/09/2019 10:31 AM MRN: [id number] Endoscopist: Mallie Mussel L. Loletha Carrow , MD Age: 69 Referring MD:  Date of Birth: 05-04-1950 Gender: Female Account #: [id number] Procedure:                Colonoscopy Indications:              Colon cancer screening in patient at increased                            risk: Colorectal cancer in mother (patient also had                            2 tbular adenomas in 2016)                           Incidental note of abdominal bloating - recent CTAP                            performed Medicines:                Monitored Anesthesia Care Procedure:                Pre-Anesthesia Assessment:                           - Prior to the procedure, a History and Physical                            was performed, and patient medications and                            allergies were reviewed. The patient's tolerance of                            previous anesthesia was also reviewed. The risks                            and benefits of the procedure and the sedation                            options and risks were discussed with the patient.                            All questions were answered, and informed consent                            was obtained. Prior Anticoagulants: The patient has                            taken no previous anticoagulant or antiplatelet                            agents except for aspirin. ASA Grade Assessment: II                            -  A patient with mild systemic disease. After                            reviewing the risks and benefits, the patient was                            deemed in satisfactory condition to undergo the                            procedure.                           After obtaining informed consent, the colonoscope                            was passed under direct vision. Throughout the                            procedure, the patient's  blood pressure, pulse, and                            oxygen saturations were monitored continuously. The                            Colonoscope was introduced through the anus and                            advanced to the the cecum, identified by                            appendiceal orifice and ileocecal valve. The                            colonoscopy was performed without difficulty. The                            patient tolerated the procedure well. The quality                            of the bowel preparation was good. The ileocecal                            valve, appendiceal orifice, and rectum were                            photographed. The bowel preparation used was SUPREP. Scope In: 10:40:03 AM Scope Out: 10:57:43 AM Scope Withdrawal Time: 0 hours 14 minutes 59 seconds  Total Procedure Duration: 0 hours 17 minutes 40 seconds  Findings:                 The perianal and digital rectal examinations were                            normal.  Three sessile polyps were found in the ascending                            colon. The polyps were diminutive in size. These                            polyps were removed with a cold snare. Resection                            and retrieval were complete.                           Three sessile polyps were found in the transverse                            colon. The polyps were diminutive in size. These                            polyps were removed with a cold snare. Resection                            and retrieval were complete.                           Multiple diverticula were found in the ascending                            colon.                           Evidence of prior surgical hemorrhoidectomy in                            distal rectum resulting in nodularity of mucosa.                           The exam was otherwise without abnormality on                            direct and retroflexion  views. Complications:            No immediate complications. Estimated Blood Loss:     Estimated blood loss was minimal. Impression:               - Three diminutive polyps in the ascending colon,                            removed with a cold snare. Resected and retrieved.                           - Three diminutive polyps in the transverse colon,                            removed with a cold snare. Resected and retrieved.                           -  Diverticulosis in the ascending colon.                           - The examination was otherwise normal on direct                            and retroflexion views. Recommendation:           - Patient has a contact number available for                            emergencies. The signs and symptoms of potential                            delayed complications were discussed with the                            patient. Return to normal activities tomorrow.                            Written discharge instructions were provided to the                            patient.                           - Resume previous diet.                           - Continue present medications.                           - Await pathology results.                           - Repeat colonoscopy is recommended for                            surveillance. The colonoscopy date will be                            determined after pathology results from today's                            exam become available for review. Demetris Capell L. Loletha Carrow, MD 03/09/2019 11:05:46 AM This report has been signed electronically.

## 2019-03-09 NOTE — Progress Notes (Signed)
Temp by LC, vitals by DT 

## 2019-03-12 ENCOUNTER — Telehealth: Payer: Self-pay | Admitting: *Deleted

## 2019-03-12 ENCOUNTER — Telehealth: Payer: Self-pay

## 2019-03-12 NOTE — Telephone Encounter (Signed)
  Follow up Call-  Call back number 03/09/2019  Post procedure Call Back phone  # [phone]  Permission to leave phone message Yes  Some recent data might be hidden     Patient questions:  Do you have a fever, pain , or abdominal swelling? No. Pain Score  0 *  Have you tolerated food without any problems? Yes.    Have you been able to return to your normal activities? Yes.    Do you have any questions about your discharge instructions: Diet   No. Medications  No. Follow up visit  No.  Do you have questions or concerns about your Care? No.  Actions: * If pain score is 4 or above: No action needed, pain <4.  1. Have you developed a fever since your procedure? no  2.   Have you had an respiratory symptoms (SOB or cough) since your procedure? no  3.   Have you tested positive for COVID 19 since your procedure no  4.   Have you had any family members/close contacts diagnosed with the COVID 19 since your procedure?  no   If yes to any of these questions please route to Joylene John, RN and Alphonsa Gin, Therapist, sports.

## 2019-03-12 NOTE — Telephone Encounter (Signed)
Left message on f/u call 

## 2019-03-15 ENCOUNTER — Encounter: Payer: Self-pay | Admitting: Gastroenterology

## 2019-04-11 ENCOUNTER — Emergency Department
Admission: EM | Admit: 2019-04-11 | Discharge: 2019-04-11 | Disposition: A | Discharge status: Home / Self Care | Payer: Medicare HMO | Attending: Emergency Medicine | Admitting: Emergency Medicine

## 2019-04-11 ENCOUNTER — Emergency Department: Payer: Medicare HMO

## 2019-04-11 ENCOUNTER — Other Ambulatory Visit: Payer: Self-pay

## 2019-04-11 DIAGNOSIS — M79602 Pain in left arm: Secondary | ICD-10-CM | POA: Diagnosis not present

## 2019-04-11 DIAGNOSIS — J45909 Unspecified asthma, uncomplicated: Secondary | ICD-10-CM | POA: Insufficient documentation

## 2019-04-11 DIAGNOSIS — Z8673 Personal history of transient ischemic attack (TIA), and cerebral infarction without residual deficits: Secondary | ICD-10-CM | POA: Insufficient documentation

## 2019-04-11 DIAGNOSIS — I10 Essential (primary) hypertension: Secondary | ICD-10-CM | POA: Diagnosis not present

## 2019-04-11 DIAGNOSIS — R079 Chest pain, unspecified: Secondary | ICD-10-CM | POA: Diagnosis not present

## 2019-04-11 DIAGNOSIS — Z7982 Long term (current) use of aspirin: Secondary | ICD-10-CM | POA: Insufficient documentation

## 2019-04-11 DIAGNOSIS — Z79899 Other long term (current) drug therapy: Secondary | ICD-10-CM | POA: Insufficient documentation

## 2019-04-11 DIAGNOSIS — R0789 Other chest pain: Secondary | ICD-10-CM | POA: Diagnosis not present

## 2019-04-11 LAB — CBC
HCT: 46.2 % — ABNORMAL HIGH (ref 36.0–46.0)
Hemoglobin: 15.7 g/dL — ABNORMAL HIGH (ref 12.0–15.0)
MCH: 31.4 pg (ref 26.0–34.0)
MCHC: 34 g/dL (ref 30.0–36.0)
MCV: 92.4 fL (ref 80.0–100.0)
Platelets: 259 10*3/uL (ref 150–400)
RBC: 5 MIL/uL (ref 3.87–5.11)
RDW: 12.7 % (ref 11.5–15.5)
WBC: 6.2 10*3/uL (ref 4.0–10.5)
nRBC: 0 % (ref 0.0–0.2)

## 2019-04-11 LAB — TROPONIN I (HIGH SENSITIVITY)
Troponin I (High Sensitivity): 4 ng/L (ref <18)
Troponin I (High Sensitivity): 3 ng/L (ref <18)

## 2019-04-11 LAB — BASIC METABOLIC PANEL
Anion gap: 8 (ref 5–15)
BUN: 12 mg/dL (ref 8–23)
CO2: 26 mmol/L (ref 22–32)
Calcium: 9 mg/dL (ref 8.9–10.3)
Chloride: 106 mmol/L (ref 98–111)
Creatinine, Ser: 0.78 mg/dL (ref 0.44–1.00)
GFR calc Af Amer: >60 mL/min (ref >60)
GFR calc non Af Amer: >60 mL/min (ref >60)
Glucose, Bld: 106 mg/dL — ABNORMAL HIGH (ref 70–99)
Potassium: 4 mmol/L (ref 3.5–5.1)
Sodium: 140 mmol/L (ref 135–145)

## 2019-04-11 MED ORDER — SODIUM CHLORIDE 0.9% FLUSH: 3.0000 mL | Freq: Once | INTRAVENOUS | Status: DC

## 2019-04-11 NOTE — ED Triage Notes (Addendum)
Pt c/o left upper chest pain that radiates into the back and left arm since last night with some intermittent episodes of feeling clammy and having SOB and nausea, pt is in NAD at present. Skin is warn and dry.

## 2019-04-11 NOTE — ED Notes (Signed)
Pt signed physical discharge form.

## 2019-04-11 NOTE — ED Provider Notes (Signed)
[name] Emergency Department Provider Note  Time seen: 10:24 AM  I have reviewed the triage vital signs and the nursing notes.   HISTORY  Chief Complaint Chest Pain   HPI Pamela Daniel is a 69 y.o. female with a past medical history of anxiety, gastric reflux, hypertension, SVT status post ablation, presents to the emergency department for chest pain.  According to the patient around 9 PM last night she developed chest pain with left arm pain states that lasted 3 or 4 minutes before resolving.  Patient states again around 12 AM last night she developed similar symptoms lasting 3 to 4 minutes and then resolved.  Patient has not had any symptoms since that time.  Describes it more as a pressure sensation in her chest and arm which have since resolved.  Denies any pain with movement of the arm.  Denies any shortness of breath nausea or diaphoresis.  Patient had an ablation approximately 10 years ago, denies any other cardiac history.  Patient believes she did have a stress test approximately 10 years ago as well that was normal per patient.  Patient denies any recent fever cough or illness but has noted some fatigue over the past several days.   Past Medical History:  Diagnosis Date  . Anxiety   . Arthritis    SPINE  . Asthma   . Blood transfusion without reported diagnosis   . Fatty liver   . Fatty pancreas   . First degree heart block   . Frequency of urination   . GERD (gastroesophageal reflux disease)   . Hemorrhoids   . Hepatic steatosis   . Hiatal hernia    POST RESIDUAL SMALL HH PER IMAGING  . History of acute pancreatitis 03/16/2015  . History of adenomatous polyp of colon    tubular adenoma's 04-08-2014/   hyperplastic bening polyp 2000  . History of concussion 2012   no residual  . History of transient ischemic attack (TIA) 12/22/2011   no residual  . History of vaginal dysplasia 2016  . Hypertension   . Pancreatic cyst   . Pseudocyst of  pancreas   . PSVT (paroxysmal supraventricular tachycardia) ([place])    cardioloigst-  dr Einar Gip--- last visit 2013 per pt and is currently followed by pcp  . Rectal bleeding   . S/P AV (atrioventricular) nodal ablation 01-21-2010    dr Caryl Comes  . Simple renal cyst    bilateral per imaging  . Urgency of urination   . Urinary frequency   . Urinary leakage     Patient Active Problem List   Diagnosis Date Noted  . Acute pancreatitis   . Cough   . Pancreatitis, acute   . Epigastric abdominal pain   . Pancreatitis 03/16/2015  . VAIN I (vaginal intraepithelial neoplasia grade I) 04/17/2014  . TIA (transient ischemic attack) 12/22/2011  . SVT/ PSVT/ PAT 01/13/2010  . RECTAL BLEEDING 08/01/2007  . DIARRHEA 08/01/2007  . ESOPHAGEAL STRICTURE 07/31/2007  . HIATAL HERNIA 07/31/2007  . COLONIC POLYPS, HX OF 07/31/2007  . HYPERTENSION 03/14/2007  . G E R D 03/14/2007    Past Surgical History:  Procedure Laterality Date  . BREAST EXCISIONAL BIOPSY Right   . CARDIAC ELECTROPHYSIOLOGY MAPPING AND ABLATION  01-21-2010   dr Caryl Comes   ablation AVNRT  . FOOT NEUROMA SURGERY Left 1996  . HEMORRHOID SURGERY N/A 10/28/2016   Procedure: HEMORRHOIDECTOMY AND HEMORRHOIDAL PEXY;  Surgeon: Leighton Ruff, MD;  Location[facility];  Service: General;  Laterality: N/A;  . LAPAROSCOPY TAKEDOWN AND REPAIR HIATAL HERNIA /  NISSEN FUNDOPLATION  09-01-2005    dr Hassell Done  [facility]  . NASAL SEPTUM SURGERY  1991  . TEE WITHOUT CARDIOVERSION  02/15/2012   Procedure: TRANSESOPHAGEAL ECHOCARDIOGRAM (TEE);  Surgeon: Laverda Page, MD;  Location: [facility] ENDOSCOPY;  Service: Cardiovascular;  Laterality: N/A;  normal LV, normal EF, mild MR, trace TR, trace PI  . TRANSTHORACIC ECHOCARDIOGRAM  12-22-2011    dr Einar Gip   normal echo  . VAGINAL HYSTERECTOMY  1996   w/  BSO    Prior to Admission medications   Medication Sig Start Date End Date Taking? Authorizing Provider  acetaminophen (TYLENOL) 325 MG tablet  Take 650 mg by mouth every 6 (six) hours as needed for mild pain.    [provider]  albuterol (PROVENTIL) (2.5 MG/3ML) 0.083% nebulizer solution Take 6 mLs (5 mg total) by nebulization once. 04/06/15   Margaretann Loveless, MD  albuterol (PROVENTIL) (2.5 MG/3ML) 0.083% nebulizer solution Take 3 mLs (2.5 mg total) by nebulization every 6 (six) hours as needed for wheezing or shortness of breath. 02/28/18   Tasia Catchings, Amy V, PA-C  ALPRAZolam Duanne Moron) 0.25 MG tablet Take 0.25 mg by mouth at bedtime as needed for anxiety.    [provider]  aspirin 325 MG tablet Take 325 mg by mouth daily.    [provider]  Cholecalciferol (VITAMIN D3) 125 MCG (5000 UT) CAPS Take by mouth.    [provider]  ibuprofen (ADVIL,MOTRIN) 200 MG tablet Take 400 mg by mouth every 6 (six) hours.    [provider]  ipratropium (ATROVENT) 0.06 % nasal spray Place 2 sprays into both nostrils 4 (four) times daily. 02/28/18   Tasia Catchings, Amy V, PA-C    No Known Allergies  Family History  Problem Relation Age of Onset  . Heart disease Mother 14       Heart failure >> death at age 80  . Dementia Mother   . Hypertension Mother   . Breast cancer Mother 50  . Colon cancer Mother   . Heart disease Father 23       MI  . COPD Father   . Heart disease Brother   . Mental illness Brother   . Lung cancer Brother 66  . Bone cancer Brother   . Lung cancer Brother   . Bone cancer Brother   . Colon cancer Brother   . Breast cancer Maternal Aunt   . Breast cancer Cousin   . Esophageal cancer Neg Hx   . Stomach cancer Neg Hx   . Rectal cancer Neg Hx     Social History Social History   Tobacco Use  . Smoking status: Never Smoker  . Smokeless tobacco: Never Used  Substance Use Topics  . Alcohol use: No  . Drug use: No    Review of Systems Constitutional: Negative for fever.  Positive for fatigue over the past 3 to 4 days. Cardiovascular: 2 episodes of chest pain the last of which occurred  greater than 10 hours ago. Respiratory: Negative for shortness of breath. Gastrointestinal: Negative for abdominal pain, vomiting and diarrhea. Genitourinary: Negative for urinary compaints Musculoskeletal: Negative for musculoskeletal complaints Neurological: Negative for headache All other ROS negative  ____________________________________________   PHYSICAL EXAM:  VITAL SIGNS: ED Triage Vitals  Enc Vitals Group     BP 04/11/19 0733 140/82     Pulse Rate 04/11/19 0733 70     Resp  04/11/19 0733 16     Temp 04/11/19 0733 98.5 F (36.9 C)     Temp Source 04/11/19 0733 Oral     SpO2 04/11/19 0733 96 %     Weight 04/11/19 0730 176 lb (79.8 kg)     Height 04/11/19 0730 5\' 1"  (1.549 m)     Head Circumference --      Peak Flow --      Pain Score 04/11/19 0730 0     Pain Loc --      Pain Edu? --      Excl. in [place]? --    Constitutional: Alert and oriented. Well appearing and in no distress. Eyes: Normal exam ENT      Head: Normocephalic and atraumatic.      Mouth/Throat: Mucous membranes are moist. Cardiovascular: Normal rate, regular rhythm. No murmur Respiratory: Normal respiratory effort without tachypnea nor retractions. Breath sounds are clear  Gastrointestinal: Soft and nontender. No distention.   Musculoskeletal: Nontender with normal range of motion in all extremities. Neurologic:  Normal speech and language. No gross focal neurologic deficits Skin:  Skin is warm, dry and intact.  Psychiatric: Mood and affect are normal.   ____________________________________________    EKG  EKG viewed and interpreted by myself shows a normal sinus rhythm at 70 bpm with a narrow QRS, normal axis, normal intervals, no concerning ST changes.  ____________________________________________    RADIOLOGY  Chest x-ray is negative  ____________________________________________   INITIAL IMPRESSION / ASSESSMENT AND PLAN / ED COURSE  Pertinent labs & imaging results that were  available during my care of the patient were reviewed by me and considered in my medical decision making (see chart for details).   Patient presents to the emergency department for 2 episodes of chest pain overnight last night.  Currently the patient denies any symptoms, overall appears very well.  Patient's labs thus far reassuring including a negative troponin.  Chest x-ray is negative and EKG is reassuring.  We will repeat a troponin.  If the patient's repeat troponin is negative and the patient remained symptom-free we will discussed with cardiology for possible stress test.  Patient agreeable to plan of care.  Patient's repeat troponin is negative.  Attempting to reach cardiology to schedule an outpatient follow-up for the patient.  Patient agreeable to plan of care.  I spoke to Dr. Humphrey Rolls of cardiology.  He will see the patient [phone] tomorrow morning.  Patient agreeable to plan of care  VALERYA DERSHEM was evaluated in Emergency Department on 04/11/2019 for the symptoms described in the history of present illness. She was evaluated in the context of the global COVID-19 pandemic, which necessitated consideration that the patient might be at risk for infection with the SARS-CoV-2 virus that causes COVID-19. Institutional protocols and algorithms that pertain to the evaluation of patients at risk for COVID-19 are in a state of rapid change based on information released by regulatory bodies including the CDC and federal and state organizations. These policies and algorithms were followed during the patient's care in the ED.  ____________________________________________   FINAL CLINICAL IMPRESSION(S) / ED DIAGNOSES  Chest pain   Harvest Dark, MD 04/11/19 1154

## 2019-04-11 NOTE — ED Notes (Signed)
Pt transported to xray via wheel chair

## 2019-04-12 DIAGNOSIS — R0602 Shortness of breath: Secondary | ICD-10-CM | POA: Diagnosis not present

## 2019-04-12 DIAGNOSIS — R609 Edema, unspecified: Secondary | ICD-10-CM | POA: Diagnosis not present

## 2019-04-12 DIAGNOSIS — R079 Chest pain, unspecified: Secondary | ICD-10-CM | POA: Diagnosis not present

## 2019-04-12 DIAGNOSIS — I10 Essential (primary) hypertension: Secondary | ICD-10-CM | POA: Diagnosis not present

## 2019-04-12 DIAGNOSIS — R9431 Abnormal electrocardiogram [ECG] [EKG]: Secondary | ICD-10-CM | POA: Diagnosis not present

## 2019-04-12 DIAGNOSIS — J45998 Other asthma: Secondary | ICD-10-CM | POA: Diagnosis not present

## 2019-04-17 DIAGNOSIS — R079 Chest pain, unspecified: Secondary | ICD-10-CM | POA: Diagnosis not present

## 2019-04-17 DIAGNOSIS — R0602 Shortness of breath: Secondary | ICD-10-CM | POA: Diagnosis not present

## 2019-04-19 DIAGNOSIS — R609 Edema, unspecified: Secondary | ICD-10-CM | POA: Diagnosis not present

## 2019-04-19 DIAGNOSIS — I340 Nonrheumatic mitral (valve) insufficiency: Secondary | ICD-10-CM | POA: Diagnosis not present

## 2019-04-19 DIAGNOSIS — E669 Obesity, unspecified: Secondary | ICD-10-CM | POA: Diagnosis not present

## 2019-04-19 DIAGNOSIS — R0602 Shortness of breath: Secondary | ICD-10-CM | POA: Diagnosis not present

## 2019-04-19 DIAGNOSIS — I10 Essential (primary) hypertension: Secondary | ICD-10-CM | POA: Diagnosis not present

## 2019-04-19 DIAGNOSIS — R079 Chest pain, unspecified: Secondary | ICD-10-CM | POA: Diagnosis not present

## 2019-04-19 DIAGNOSIS — R42 Dizziness and giddiness: Secondary | ICD-10-CM | POA: Diagnosis not present

## 2019-05-04 ENCOUNTER — Other Ambulatory Visit: Payer: Self-pay | Admitting: Internal Medicine

## 2019-05-04 DIAGNOSIS — Z1231 Encounter for screening mammogram for malignant neoplasm of breast: Secondary | ICD-10-CM

## 2019-05-14 DIAGNOSIS — R42 Dizziness and giddiness: Secondary | ICD-10-CM | POA: Diagnosis not present

## 2019-05-16 DIAGNOSIS — I10 Essential (primary) hypertension: Secondary | ICD-10-CM | POA: Diagnosis not present

## 2019-05-16 DIAGNOSIS — R609 Edema, unspecified: Secondary | ICD-10-CM | POA: Diagnosis not present

## 2019-05-16 DIAGNOSIS — E669 Obesity, unspecified: Secondary | ICD-10-CM | POA: Diagnosis not present

## 2019-05-16 DIAGNOSIS — R5381 Other malaise: Secondary | ICD-10-CM | POA: Diagnosis not present

## 2019-05-16 DIAGNOSIS — E782 Mixed hyperlipidemia: Secondary | ICD-10-CM | POA: Diagnosis not present

## 2019-05-16 DIAGNOSIS — R0602 Shortness of breath: Secondary | ICD-10-CM | POA: Diagnosis not present

## 2019-05-31 DIAGNOSIS — G4733 Obstructive sleep apnea (adult) (pediatric): Secondary | ICD-10-CM | POA: Diagnosis not present

## 2019-06-21 DIAGNOSIS — G4733 Obstructive sleep apnea (adult) (pediatric): Secondary | ICD-10-CM | POA: Diagnosis not present

## 2019-06-29 DIAGNOSIS — E785 Hyperlipidemia, unspecified: Secondary | ICD-10-CM | POA: Diagnosis not present

## 2019-06-29 DIAGNOSIS — R609 Edema, unspecified: Secondary | ICD-10-CM | POA: Diagnosis not present

## 2019-06-29 DIAGNOSIS — I509 Heart failure, unspecified: Secondary | ICD-10-CM | POA: Diagnosis not present

## 2019-06-29 DIAGNOSIS — I10 Essential (primary) hypertension: Secondary | ICD-10-CM | POA: Diagnosis not present

## 2019-06-29 DIAGNOSIS — G4739 Other sleep apnea: Secondary | ICD-10-CM | POA: Diagnosis not present

## 2019-07-16 DIAGNOSIS — G4733 Obstructive sleep apnea (adult) (pediatric): Secondary | ICD-10-CM | POA: Diagnosis not present

## 2019-07-27 DIAGNOSIS — F419 Anxiety disorder, unspecified: Secondary | ICD-10-CM | POA: Diagnosis not present

## 2019-07-27 DIAGNOSIS — R635 Abnormal weight gain: Secondary | ICD-10-CM | POA: Diagnosis not present

## 2019-07-27 DIAGNOSIS — E559 Vitamin D deficiency, unspecified: Secondary | ICD-10-CM | POA: Diagnosis not present

## 2019-07-27 DIAGNOSIS — I10 Essential (primary) hypertension: Secondary | ICD-10-CM | POA: Diagnosis not present

## 2019-07-27 DIAGNOSIS — G4733 Obstructive sleep apnea (adult) (pediatric): Secondary | ICD-10-CM | POA: Diagnosis not present

## 2019-07-27 DIAGNOSIS — J45909 Unspecified asthma, uncomplicated: Secondary | ICD-10-CM | POA: Diagnosis not present

## 2019-08-15 DIAGNOSIS — G4733 Obstructive sleep apnea (adult) (pediatric): Secondary | ICD-10-CM | POA: Diagnosis not present

## 2019-08-16 DIAGNOSIS — G4733 Obstructive sleep apnea (adult) (pediatric): Secondary | ICD-10-CM | POA: Diagnosis not present

## 2019-08-29 DIAGNOSIS — E785 Hyperlipidemia, unspecified: Secondary | ICD-10-CM | POA: Diagnosis not present

## 2019-08-29 DIAGNOSIS — I10 Essential (primary) hypertension: Secondary | ICD-10-CM | POA: Diagnosis not present

## 2019-08-29 DIAGNOSIS — I509 Heart failure, unspecified: Secondary | ICD-10-CM | POA: Diagnosis not present

## 2019-08-29 DIAGNOSIS — G4731 Primary central sleep apnea: Secondary | ICD-10-CM | POA: Diagnosis not present

## 2019-09-15 DIAGNOSIS — G4733 Obstructive sleep apnea (adult) (pediatric): Secondary | ICD-10-CM | POA: Diagnosis not present

## 2019-09-21 DIAGNOSIS — G4733 Obstructive sleep apnea (adult) (pediatric): Secondary | ICD-10-CM | POA: Diagnosis not present

## 2019-10-16 DIAGNOSIS — G4733 Obstructive sleep apnea (adult) (pediatric): Secondary | ICD-10-CM | POA: Diagnosis not present

## 2019-11-15 DIAGNOSIS — G4733 Obstructive sleep apnea (adult) (pediatric): Secondary | ICD-10-CM | POA: Diagnosis not present

## 2019-12-16 DIAGNOSIS — G4733 Obstructive sleep apnea (adult) (pediatric): Secondary | ICD-10-CM | POA: Diagnosis not present

## 2019-12-31 DIAGNOSIS — I10 Essential (primary) hypertension: Secondary | ICD-10-CM | POA: Diagnosis not present

## 2019-12-31 DIAGNOSIS — R0602 Shortness of breath: Secondary | ICD-10-CM | POA: Diagnosis not present

## 2019-12-31 DIAGNOSIS — I340 Nonrheumatic mitral (valve) insufficiency: Secondary | ICD-10-CM | POA: Diagnosis not present

## 2019-12-31 DIAGNOSIS — R079 Chest pain, unspecified: Secondary | ICD-10-CM | POA: Diagnosis not present

## 2020-01-15 DIAGNOSIS — G4733 Obstructive sleep apnea (adult) (pediatric): Secondary | ICD-10-CM | POA: Diagnosis not present

## 2020-01-16 ENCOUNTER — Ambulatory Visit: Payer: Self-pay | Admitting: Cardiovascular Disease

## 2020-01-16 ENCOUNTER — Other Ambulatory Visit: Payer: Self-pay | Admitting: Cardiovascular Disease

## 2020-01-16 DIAGNOSIS — I10 Essential (primary) hypertension: Secondary | ICD-10-CM | POA: Diagnosis not present

## 2020-01-16 DIAGNOSIS — R079 Chest pain, unspecified: Secondary | ICD-10-CM | POA: Insufficient documentation

## 2020-01-16 DIAGNOSIS — I200 Unstable angina: Secondary | ICD-10-CM | POA: Insufficient documentation

## 2020-01-16 DIAGNOSIS — I209 Angina pectoris, unspecified: Secondary | ICD-10-CM | POA: Diagnosis not present

## 2020-01-16 DIAGNOSIS — G4733 Obstructive sleep apnea (adult) (pediatric): Secondary | ICD-10-CM | POA: Diagnosis not present

## 2020-01-16 DIAGNOSIS — E785 Hyperlipidemia, unspecified: Secondary | ICD-10-CM | POA: Diagnosis not present

## 2020-01-16 MED ORDER — SODIUM CHLORIDE 0.9% FLUSH
3.0000 mL | Freq: Two times a day (BID) | INTRAVENOUS | Status: DC
  Filled 2020-01-16: qty 3

## 2020-01-17 ENCOUNTER — Encounter
Admission: RE | Disposition: A | Discharge status: Home / Self Care | Payer: Self-pay | Source: Home / Self Care | Attending: Cardiovascular Disease

## 2020-01-17 ENCOUNTER — Encounter: Payer: Self-pay | Admitting: Cardiovascular Disease

## 2020-01-17 ENCOUNTER — Other Ambulatory Visit: Payer: Self-pay

## 2020-01-17 ENCOUNTER — Ambulatory Visit
Admission: RE | Admit: 2020-01-17 | Discharge: 2020-01-17 | Disposition: A | Discharge status: Home / Self Care | Payer: Medicare HMO | Attending: Cardiovascular Disease | Admitting: Cardiovascular Disease

## 2020-01-17 DIAGNOSIS — I200 Unstable angina: Secondary | ICD-10-CM

## 2020-01-17 DIAGNOSIS — R079 Chest pain, unspecified: Secondary | ICD-10-CM | POA: Diagnosis not present

## 2020-01-17 HISTORY — PX: LEFT HEART CATH AND CORONARY ANGIOGRAPHY: CATH118249

## 2020-01-17 SURGERY — LEFT HEART CATH AND CORONARY ANGIOGRAPHY
Anesthesia: Moderate Sedation

## 2020-01-17 MED ORDER — HEPARIN (PORCINE) IN NACL 1000-0.9 UT/500ML-% IV SOLN
INTRAVENOUS | Status: AC
  Filled 2020-01-17: qty 1000

## 2020-01-17 MED ORDER — SODIUM CHLORIDE 0.9 % WEIGHT BASED INFUSION
3.0000 mL/kg/h | INTRAVENOUS | Status: DC
  Administered 2020-01-17: 08:00:00 3 mL/kg/h via INTRAVENOUS

## 2020-01-17 MED ORDER — ACETAMINOPHEN 325 MG PO TABS: 650.0000 mg | ORAL_TABLET | ORAL | Status: DC | PRN

## 2020-01-17 MED ORDER — LIDOCAINE HCL (PF) 1 % IJ SOLN
INTRAMUSCULAR | Status: DC | PRN
  Administered 2020-01-17: 15 mL

## 2020-01-17 MED ORDER — SODIUM CHLORIDE 0.9 % IV SOLN: 250.0000 mL | INTRAVENOUS | Status: DC | PRN

## 2020-01-17 MED ORDER — SODIUM CHLORIDE 0.9% FLUSH: 3.0000 mL | INTRAVENOUS | Status: DC | PRN

## 2020-01-17 MED ORDER — ASPIRIN 81 MG PO CHEW
CHEWABLE_TABLET | ORAL | Status: AC
  Filled 2020-01-17: qty 1

## 2020-01-17 MED ORDER — MIDAZOLAM HCL 2 MG/2ML IJ SOLN
INTRAMUSCULAR | Status: DC | PRN
  Administered 2020-01-17: 1 mg via INTRAVENOUS

## 2020-01-17 MED ORDER — LIDOCAINE HCL (PF) 1 % IJ SOLN
INTRAMUSCULAR | Status: AC
  Filled 2020-01-17: qty 30

## 2020-01-17 MED ORDER — IOHEXOL 300 MG/ML  SOLN
INTRAMUSCULAR | Status: DC | PRN
  Administered 2020-01-17: 10:00:00 55 mL

## 2020-01-17 MED ORDER — SODIUM CHLORIDE 0.9 % WEIGHT BASED INFUSION: 1.0000 mL/kg/h | INTRAVENOUS | Status: DC

## 2020-01-17 MED ORDER — SODIUM CHLORIDE 0.9% FLUSH: 3.0000 mL | Freq: Two times a day (BID) | INTRAVENOUS | Status: DC

## 2020-01-17 MED ORDER — LABETALOL HCL 5 MG/ML IV SOLN
10.0000 mg | INTRAVENOUS | Status: DC | PRN
Start: 2020-01-17 — End: 2020-01-17

## 2020-01-17 MED ORDER — FENTANYL CITRATE (PF) 100 MCG/2ML IJ SOLN
INTRAMUSCULAR | Status: DC | PRN
  Administered 2020-01-17: 50 ug via INTRAVENOUS

## 2020-01-17 MED ORDER — ASPIRIN 81 MG PO CHEW
81.0000 mg | CHEWABLE_TABLET | ORAL | Status: AC
  Administered 2020-01-17: 08:00:00 81 mg via ORAL

## 2020-01-17 MED ORDER — FENTANYL CITRATE (PF) 100 MCG/2ML IJ SOLN
INTRAMUSCULAR | Status: AC
  Filled 2020-01-17: qty 2

## 2020-01-17 MED ORDER — HEPARIN (PORCINE) IN NACL 1000-0.9 UT/500ML-% IV SOLN
INTRAVENOUS | Status: DC | PRN
  Administered 2020-01-17: 500 mL

## 2020-01-17 MED ORDER — MIDAZOLAM HCL 2 MG/2ML IJ SOLN
INTRAMUSCULAR | Status: AC
  Filled 2020-01-17: qty 2

## 2020-01-17 MED ORDER — ONDANSETRON HCL 4 MG/2ML IJ SOLN: 4.0000 mg | Freq: Four times a day (QID) | INTRAMUSCULAR | Status: DC | PRN

## 2020-01-17 MED ORDER — HYDRALAZINE HCL 20 MG/ML IJ SOLN: 10.0000 mg | INTRAMUSCULAR | Status: DC | PRN

## 2020-01-17 MED ORDER — SODIUM CHLORIDE 0.9 % WEIGHT BASED INFUSION: 3.0000 mL/kg/h | INTRAVENOUS | Status: DC

## 2020-01-17 SURGICAL SUPPLY — 9 items
CATH INFINITI 5FR ANG PIGTAIL (CATHETERS) IMPLANT
CATH INFINITI 5FR JL4 (CATHETERS) ×2 IMPLANT
CATH INFINITI JR4 5F (CATHETERS) ×2 IMPLANT
DEVICE CLOSURE MYNXGRIP 5F (Vascular Products) ×2 IMPLANT
KIT MANI 3VAL PERCEP (MISCELLANEOUS) ×2 IMPLANT
NEEDLE PERC 18GX7CM (NEEDLE) ×2 IMPLANT
PACK CARDIAC CATH (CUSTOM PROCEDURE TRAY) ×2 IMPLANT
SHEATH AVANTI 5FR X 11CM (SHEATH) ×2 IMPLANT
WIRE GUIDERIGHT .035X150 (WIRE) ×2 IMPLANT

## 2020-01-17 NOTE — Discharge Instructions (Signed)
Angiogram, Care After This sheet gives you information about how to care for yourself after your procedure. Your health care provider may also give you more specific instructions. If you have problems or questions, contact your health care provider. What can I expect after the procedure? After the procedure, it is common to have bruising and tenderness at the catheter insertion area. Follow these instructions at home: Insertion site care  Follow instructions from your health care provider about how to take care of your insertion site. Make sure you: ? Wash your hands with soap and water before you change your bandage (dressing). If soap and water are not available, use hand sanitizer. ? Change your dressing as told by your health care provider. ? Leave stitches (sutures), skin glue, or adhesive strips in place. These skin closures may need to stay in place for 2 weeks or longer. If adhesive strip edges start to loosen and curl up, you may trim the loose edges. Do not remove adhesive strips completely unless your health care provider tells you to do that.  Do not take baths, swim, or use a hot tub until your health care provider approves.  You may shower 24-48 hours after the procedure or as told by your health care provider. ? Gently wash the site with plain soap and water. ? Pat the area dry with a clean towel. ? Do not rub the site. This may cause bleeding.  Do not apply powder or lotion to the site. Keep the site clean and dry.  Check your insertion site every day for signs of infection. Check for: ? Redness, swelling, or pain. ? Fluid or blood. ? Warmth. ? Pus or a bad smell. Activity  Rest as told by your health care provider, usually for 1-2 days.  Do not lift anything that is heavier than 10 lbs. (4.5 kg) or as told by your health care provider.  Do not drive for 24 hours if you were given a medicine to help you relax (sedative).  Do not drive or use heavy machinery while  taking prescription pain medicine. General instructions   Return to your normal activities as told by your health care provider, usually in about a week. Ask your health care provider what activities are safe for you.  If the catheter site starts bleeding, lie flat and put pressure on the site. If the bleeding does not stop, get help right away. This is a medical emergency.  Drink enough fluid to keep your urine clear or pale yellow. This helps flush the contrast dye from your body.  Take over-the-counter and prescription medicines only as told by your health care provider.  Keep all follow-up visits as told by your health care provider. This is important. Contact a health care provider if:  You have a fever or chills.  You have redness, swelling, or pain around your insertion site.  You have fluid or blood coming from your insertion site.  The insertion site feels warm to the touch.  You have pus or a bad smell coming from your insertion site.  You have bruising around the insertion site.  You notice blood collecting in the tissue around the catheter site (hematoma). The hematoma may be painful to the touch. Get help right away if:  You have severe pain at the catheter insertion area.  The catheter insertion area swells very fast.  The catheter insertion area is bleeding, and the bleeding does not stop when you hold steady pressure on the area.    The area near or just beyond the catheter insertion site becomes pale, cool, tingly, or numb. These symptoms may represent a serious problem that is an emergency. Do not wait to see if the symptoms will go away. Get medical help right away. Call your local emergency services (911 in the [place] Do not drive yourself to the hospital. Summary  After the procedure, it is common to have bruising and tenderness at the catheter insertion area.  After the procedure, it is important to rest and drink plenty of fluids.  Do not take baths,  swim, or use a hot tub until your health care provider says it is okay to do so. You may shower 24-48 hours after the procedure or as told by your health care provider.  If the catheter site starts bleeding, lie flat and put pressure on the site. If the bleeding does not stop, get help right away. This is a medical emergency. This information is not intended to replace advice given to you by your health care provider. Make sure you discuss any questions you have with your health care provider. Document Revised: 12/17/2016 Document Reviewed: 12/10/2015 Elsevier Patient Education  2020 [employer]  

## 2020-01-30 DIAGNOSIS — E559 Vitamin D deficiency, unspecified: Secondary | ICD-10-CM | POA: Diagnosis not present

## 2020-01-30 DIAGNOSIS — I10 Essential (primary) hypertension: Secondary | ICD-10-CM | POA: Diagnosis not present

## 2020-01-31 DIAGNOSIS — M8589 Other specified disorders of bone density and structure, multiple sites: Secondary | ICD-10-CM | POA: Diagnosis not present

## 2020-01-31 DIAGNOSIS — E559 Vitamin D deficiency, unspecified: Secondary | ICD-10-CM | POA: Diagnosis not present

## 2020-02-05 DIAGNOSIS — M545 Low back pain, unspecified: Secondary | ICD-10-CM | POA: Diagnosis not present

## 2020-02-05 DIAGNOSIS — K219 Gastro-esophageal reflux disease without esophagitis: Secondary | ICD-10-CM | POA: Diagnosis not present

## 2020-02-05 DIAGNOSIS — E559 Vitamin D deficiency, unspecified: Secondary | ICD-10-CM | POA: Diagnosis not present

## 2020-02-05 DIAGNOSIS — G4733 Obstructive sleep apnea (adult) (pediatric): Secondary | ICD-10-CM | POA: Diagnosis not present

## 2020-02-05 DIAGNOSIS — Z0000 Encounter for general adult medical examination without abnormal findings: Secondary | ICD-10-CM | POA: Diagnosis not present

## 2020-02-05 DIAGNOSIS — M8580 Other specified disorders of bone density and structure, unspecified site: Secondary | ICD-10-CM | POA: Diagnosis not present

## 2020-02-05 DIAGNOSIS — I10 Essential (primary) hypertension: Secondary | ICD-10-CM | POA: Diagnosis not present

## 2020-02-05 DIAGNOSIS — I471 Supraventricular tachycardia: Secondary | ICD-10-CM | POA: Diagnosis not present

## 2020-02-05 DIAGNOSIS — F419 Anxiety disorder, unspecified: Secondary | ICD-10-CM | POA: Diagnosis not present

## 2020-02-15 DIAGNOSIS — G4733 Obstructive sleep apnea (adult) (pediatric): Secondary | ICD-10-CM | POA: Diagnosis not present

## 2020-02-25 DIAGNOSIS — R079 Chest pain, unspecified: Secondary | ICD-10-CM | POA: Diagnosis not present

## 2020-02-25 DIAGNOSIS — R0602 Shortness of breath: Secondary | ICD-10-CM | POA: Diagnosis not present

## 2020-02-25 DIAGNOSIS — I10 Essential (primary) hypertension: Secondary | ICD-10-CM | POA: Diagnosis not present

## 2020-02-25 DIAGNOSIS — E785 Hyperlipidemia, unspecified: Secondary | ICD-10-CM | POA: Diagnosis not present

## 2020-02-27 ENCOUNTER — Other Ambulatory Visit: Payer: Self-pay | Admitting: Internal Medicine

## 2020-02-27 DIAGNOSIS — N644 Mastodynia: Secondary | ICD-10-CM

## 2020-03-17 DIAGNOSIS — G4733 Obstructive sleep apnea (adult) (pediatric): Secondary | ICD-10-CM | POA: Diagnosis not present

## 2020-04-08 ENCOUNTER — Other Ambulatory Visit: Payer: Medicare HMO

## 2020-04-11 ENCOUNTER — Ambulatory Visit
Admission: RE | Admit: 2020-04-11 | Discharge: 2020-04-11 | Disposition: A | Discharge status: Home / Self Care | Payer: Medicare HMO | Source: Ambulatory Visit | Attending: Internal Medicine | Admitting: Internal Medicine

## 2020-04-11 ENCOUNTER — Other Ambulatory Visit: Payer: Self-pay

## 2020-04-11 DIAGNOSIS — R922 Inconclusive mammogram: Secondary | ICD-10-CM | POA: Diagnosis not present

## 2020-04-11 DIAGNOSIS — R928 Other abnormal and inconclusive findings on diagnostic imaging of breast: Secondary | ICD-10-CM | POA: Diagnosis not present

## 2020-04-11 DIAGNOSIS — N644 Mastodynia: Secondary | ICD-10-CM

## 2020-04-14 DIAGNOSIS — G4733 Obstructive sleep apnea (adult) (pediatric): Secondary | ICD-10-CM | POA: Diagnosis not present

## 2020-04-29 ENCOUNTER — Ambulatory Visit
Payer: Medicare HMO | Attending: Student in an Organized Health Care Education/Training Program | Admitting: Student in an Organized Health Care Education/Training Program

## 2020-04-29 ENCOUNTER — Other Ambulatory Visit: Payer: Self-pay

## 2020-04-29 ENCOUNTER — Encounter: Payer: Self-pay | Admitting: Student in an Organized Health Care Education/Training Program

## 2020-04-29 VITALS — BP 136/75 | HR 66 | Temp 96.6°F | Resp 14 | Ht 60.0 in | Wt 179.0 lb

## 2020-04-29 DIAGNOSIS — M5136 Other intervertebral disc degeneration, lumbar region: Secondary | ICD-10-CM | POA: Diagnosis not present

## 2020-04-29 DIAGNOSIS — G894 Chronic pain syndrome: Secondary | ICD-10-CM | POA: Diagnosis not present

## 2020-04-29 DIAGNOSIS — M47816 Spondylosis without myelopathy or radiculopathy, lumbar region: Secondary | ICD-10-CM | POA: Diagnosis not present

## 2020-04-29 NOTE — Patient Instructions (Addendum)
Stop ASA 325 7 days prior, start 81 mg ASA instead  ____________________________________________________________________________________________  Preparing for Procedure with Sedation  Procedure appointments are limited to planned procedures: . No Prescription Refills. . No disability issues will be discussed. . No medication changes will be discussed.  Instructions: . Oral Intake: Do not eat or drink anything for at least 8 hours prior to your procedure. (Exception: Blood Pressure Medication. See below.) . Transportation: Unless otherwise stated by your physician, you may drive yourself after the procedure. . Blood Pressure Medicine: Do not forget to take your blood pressure medicine with a sip of water the morning of the procedure. If your Diastolic (lower reading)is above 100 mmHg, elective cases will be cancelled/rescheduled. . Blood thinners: These will need to be stopped for procedures. Notify our staff if you are taking any blood thinners. Depending on which one you take, there will be specific instructions on how and when to stop it. . Diabetics on insulin: Notify the staff so that you can be scheduled 1st case in the morning. If your diabetes requires high dose insulin, take only  of your normal insulin dose the morning of the procedure and notify the staff that you have done so. . Preventing infections: Shower with an antibacterial soap the morning of your procedure. . Build-up your immune system: Take 1000 mg of Vitamin C with every meal (3 times a day) the day prior to your procedure. Marland Kitchen Antibiotics: Inform the staff if you have a condition or reason that requires you to take antibiotics before dental procedures. . Pregnancy: If you are pregnant, call and cancel the procedure. . Sickness: If you have a cold, fever, or any active infections, call and cancel the procedure. . Arrival: You must be in the facility at least 30 minutes prior to your scheduled procedure. . Children: Do not  bring children with you. . Dress appropriately: Bring dark clothing that you would not mind if they get stained. . Valuables: Do not bring any jewelry or valuables.  Reasons to call and reschedule or cancel your procedure: (Following these recommendations will minimize the risk of a serious complication.) . Surgeries: Avoid having procedures within 2 weeks of any surgery. (Avoid for 2 weeks before or after any surgery). . Flu Shots: Avoid having procedures within 2 weeks of a flu shots or . (Avoid for 2 weeks before or after immunizations). . Barium: Avoid having a procedure within 7-10 days after having had a radiological study involving the use of radiological contrast. (Myelograms, Barium swallow or enema study). . Heart attacks: Avoid any elective procedures or surgeries for the initial 6 months after a "Myocardial Infarction" (Heart Attack). . Blood thinners: It is imperative that you stop these medications before procedures. Let us know if you if you take any blood thinner.  . Infection: Avoid procedures during or within two weeks of an infection (including chest colds or gastrointestinal problems). Symptoms associated with infections include: Localized redness, fever, chills, night sweats or profuse sweating, burning sensation when voiding, cough, congestion, stuffiness, runny nose, sore throat, diarrhea, nausea, vomiting, cold or Flu symptoms, recent or current infections. It is specially important if the infection is over the area that we intend to treat. Marland Kitchen Heart and lung problems: Symptoms that may suggest an active cardiopulmonary problem include: cough, chest pain, breathing difficulties or shortness of breath, dizziness, ankle swelling, uncontrolled high or unusually low blood pressure, and/or palpitations. If you are experiencing any of these symptoms, cancel your procedure and contact your primary  care physician for an evaluation.  Remember:  Regular Business hours are:  Monday to  Thursday 8:00 AM to 4:00 PM  Provider's Schedule: Milinda Pointer, MD:  Procedure days: Tuesday and Thursday 7:30 AM to 4:00 PM  Gillis Santa, MD:  Procedure days: Monday and Wednesday 7:30 AM to 4:00 PM ____________________________________________________________________________________________

## 2020-04-29 NOTE — Progress Notes (Signed)
Safety precautions to be maintained throughout the outpatient stay will include: orient to surroundings, keep bed in low position, maintain call bell within reach at all times, provide assistance with transfer out of bed and ambulation.  

## 2020-04-29 NOTE — Progress Notes (Signed)
Patient: Pamela Daniel  Service Category: E/M  Provider: Gillis Santa, MD  DOB: 1950-12-29  DOS: 04/29/2020  Referring Provider: Velna Hatchet, MD  MRN: [id number]  Setting: Ambulatory outpatient  PCP: Velna Hatchet, MD  Type: New Patient  Specialty: Interventional Pain Management    Location: Office  Delivery: Face-to-face     Primary Reason(s) for Visit: Encounter for initial evaluation of one or more chronic problems (new to examiner) potentially causing chronic pain, and posing a threat to normal musculoskeletal function. (Level of risk: High) CC: Back Pain (lower)  HPI  Pamela Daniel is a 70 y.o. year old, female patient, who comes for the first time to our practice referred by Velna Hatchet, MD for our initial evaluation of her chronic pain. She has HYPERTENSION; ESOPHAGEAL STRICTURE; G E R D; HIATAL HERNIA; RECTAL BLEEDING; DIARRHEA; COLONIC POLYPS, HX OF; SVT/ PSVT/ PAT; TIA (transient ischemic attack); VAIN I (vaginal intraepithelial neoplasia grade I); Pancreatitis; Pancreatitis, acute; Epigastric abdominal pain; Acute pancreatitis; Cough; Chest pain; Unstable angina ([place]); Lumbar spondylosis; Lumbar degenerative disc disease; and Chronic pain syndrome on their problem list. Today she comes in for evaluation of her Back Pain (lower)  Pain Assessment: Location: Lower Back Radiating: front of both upper legs, with numbness in left buttock Onset: More than a month ago Duration: Chronic pain Quality: Other (Comment) (excruciating) Severity: 1 /10 (subjective, self-reported pain score)  Effect on ADL: difficulty perfoming daily activities Timing: Intermittent Modifying factors: stretching BP: 136/75  HR: 66  Onset and Duration: Gradual and Present longer than 3 months Cause of pain: Unknown Severity: Getting worse, NAS-11 at its worse: 10/10, NAS-11 at its best: 1/10, NAS-11 now: 1/10 and NAS-11 on the average: 4/10 Timing: Not influenced by the time of the day, During activity  or exercise, After activity or exercise and After a period of immobility Aggravating Factors: Bending, Bowel movements, Climbing, Lifiting, Motion, Prolonged sitting, Prolonged standing, Squatting, Stooping , Twisting, Walking, Walking uphill, Walking downhill and Working Alleviating Factors: Bending, Stretching, Lying down, Resting, Using a brace and Warm showers or baths Associated Problems: Constipation, Depression, Fatigue, Inability to concentrate, Inability to control bladder (urine), Nausea, Numbness, Spasms, Weakness, Pain that wakes patient up and Pain that does not allow patient to sleep Quality of Pain: Agonizing, Constant, Disabling, Distressing, Exhausting, Feeling of constriction, Feeling of weight, Getting longer, Nagging, Pressure-like, Sharp, Stabbing and Toothache-like Previous Examinations or Tests: Bone scan, Endoscopy, MRI scan, Nerve block, X-rays and Neurological evaluation Previous Treatments: Chiropractic manipulations, Facet blocks, Physical Therapy and Stretching exercises  Tuwanna Krausz presents with a chief complaint of low back pain that radiates into bilateral buttocks.  This is related to lumbar facet arthropathy and lumbar spondylosis.  Of note, patient has seen me over 2 years ago for lumbar facet medial branch nerve blocks which were effective for her and she went on to have a therapeutic lumbar radiofrequency ablation.  This was done on the right side at L3, L4, L5 on 06/29/2017 and on the left side at L3, L4, L5 on 08/03/2017.  Patient states that she did exceptionally well for approximately 2 years with almost 95% reduction in her pain, improvement in her functional status and a decrease in utilization of as needed over-the-counter pain medications including NSAIDs and acetaminophen.  She states that over the last 4 to 5 weeks, her pain has gone worse and she has pain with lumbar extension and lateral rotation.  Given that has been over 2 years since her previous lumbar  radiofrequency  ablation, recommend repeating as the patient has likely had regeneration of her medial branch nerves.  Risks and benefits of this procedure were discussed.  Patient instructed to stop her aspirin 325 7 days prior to procedure and transition to 81 mg aspirin.  Of note, she has completed 6 weeks of physical therapy in the past with limited response to her low back pain.  She tries to do home stretching exercises at home.  She takes ibuprofen and acetaminophen as needed.  Meds   Current Outpatient Medications:  .  acetaminophen (TYLENOL) 325 MG tablet, Take 650 mg by mouth every 6 (six) hours as needed for mild pain., Disp: , Rfl:  .  albuterol (PROVENTIL) (2.5 MG/3ML) 0.083% nebulizer solution, Take 3 mLs (2.5 mg total) by nebulization every 6 (six) hours as needed for wheezing or shortness of breath., Disp: 75 mL, Rfl: 0 .  ALPRAZolam (XANAX) 0.25 MG tablet, Take by mouth daily as needed for anxiety., Disp: , Rfl:  .  aspirin 325 MG tablet, Take 325 mg by mouth daily., Disp: , Rfl:  .  budesonide-formoterol (SYMBICORT) 160-4.5 MCG/ACT inhaler, Inhale 2 puffs into the lungs daily as needed (Shortness of breath)., Disp: , Rfl:  .  Cholecalciferol (VITAMIN D3) 1.25 MG (50000 UT) CAPS, 1 capsule, Disp: , Rfl:  .  ibuprofen (ADVIL,MOTRIN) 200 MG tablet, Take 800 mg by mouth every 8 (eight) hours as needed for mild pain., Disp: , Rfl:  .  nitroGLYCERIN (NITROSTAT) 0.4 MG SL tablet, Place 0.4 mg under the tongue every 5 (five) minutes as needed for chest pain., Disp: , Rfl:  .  rosuvastatin (CRESTOR) 20 MG tablet, Take 20 mg by mouth at bedtime., Disp: , Rfl:  .  spironolactone (ALDACTONE) 25 MG tablet, Take 25 mg by mouth daily., Disp: , Rfl:  .  pantoprazole (PROTONIX) 40 MG tablet, , Disp: , Rfl:  No current facility-administered medications for this visit.  Facility-Administered Medications Ordered in Other Visits:  .  sodium chloride flush (NS) 0.9 % injection 3 mL, 3 mL, Intravenous,  Q12H, Dionisio David, MD  Imaging Review  Cervical Imaging: Cervical MR wo contrast: Results for orders placed during the hospital encounter of 01/24/17  MR CERVICAL SPINE WO CONTRAST  Narrative CLINICAL DATA:  Neck pain  EXAM: MRI CERVICAL SPINE WITHOUT CONTRAST  TECHNIQUE: Multiplanar, multisequence MR imaging of the cervical spine was performed. No intravenous contrast was administered.  COMPARISON:  None.  FINDINGS: Alignment: Normal  Vertebrae: Negative for fracture or mass  Cord: Normal signal and morphology  Posterior Fossa, vertebral arteries, paraspinal tissues: Negative  Disc levels:  C2-3:  Negative  C3-4: Mild facet degeneration on the left. No significant spinal or foraminal stenosis.  C4-5: Disc degeneration with mild uncinate spurring bilaterally. Mild facet hypertrophy bilaterally. Mild spinal and foraminal stenosis bilaterally due to spurring  C5-6:  Mild degenerative change without stenosis  C6-7:  Mild degenerative change without stenosis  C7-T1:  Negative  IMPRESSION: Mild cervical spine degenerative change. Mild spinal and foraminal stenosis bilaterally at C4-5 due to spurring.   Electronically Signed By: Franchot Gallo M.D. On: 01/24/2017 10:12 MR LUMBAR SPINE WO CONTRAST  Narrative CLINICAL DATA:  Chronic low back pain  EXAM: MRI LUMBAR SPINE WITHOUT CONTRAST  TECHNIQUE: Multiplanar, multisequence MR imaging of the lumbar spine was performed. No intravenous contrast was administered.  COMPARISON:  CT abdomen pelvis 09/10/2016, MRI lumbar spine 11/23/2006  FINDINGS: Segmentation: Normal segmentation. After review of the prior CT abdomen pelvis, there  are small ribs at T12. Therefore L5 is a transitional vertebra, largely incorporated into the sacrum. This places grade 1 anterolisthesis at L4-5. This numbering appears more accurate however is a change from the prior MRI report 2008.  Alignment:  7 mm anterolisthesis  L4-5.  Remaining alignment normal  Vertebrae:  Negative for fracture or mass.  Conus medullaris and cauda equina: Conus extends to the mid L1 level. Conus and cauda equina appear normal.  Paraspinal and other soft tissues: Small renal cysts bilaterally. No retroperitoneal mass  Disc levels:  T12-L1:  Negative  L1-2:  Negative  L2-3:  Mild disc and facet degeneration without stenosis  L3-4: Severe facet degeneration with bony overgrowth and prominent bilateral facet joint effusions, progressive since 2008. No significant spinal or foraminal stenosis.  L4-5: Grade 1 anterolisthesis with severe facet degeneration. Mild left foraminal narrowing without neural compression. Spinal canal adequate in size  L5-S1: Rudimentary disc space without degenerative change or stenosis.  IMPRESSION: Based on small ribs at T12, L5 is incorporated into the sacrum.  Grade 1 anterolisthesis L4-5 with severe facet degeneration and no significant spinal stenosis  Severe facet degeneration L3-4 has progressed in the interval without significant spinal stenosis.   Electronically Signed By: Franchot Gallo M.D. On: 01/24/2017 10:09 MR Lumbar Spine W Wo Contrast  Narrative Clinical Data: Back pain radiating down the left leg. Assess for possible fracture. MRI LUMBAR SPINE WITHOUT AND WITH CONTRAST: Technique: Multiplanar and multiecho pulse sequences of the lumbar spine, to include the lower thoracic region and upper sacral regions, were obtained according to standard protocol before and after administration of intravenous contrast. Contrast: 15 cc Magnevist. Findings: The scan covers the region from S2 to T8. At T8-9, there is discogenic edema within the anterior vertebral body marrow. No evidence of a fracture. No disc protrusion. No abnormality is seen below that as far down as L4-5. The patient does have some facet degeneration at the L4-5 level but there is no encroachment upon the canal or  foramina. At L5-S1, there is bilateral facet arthropathy with anterolisthesis of 6 mm. There is bulging of the disc. No compressive stenosis is evident.  Impression 1. No evidence of compression fracture. 2. Degenerative disc disease at T8-9 with discogenic edema within the anterior aspect of the T8 and T9 vertebral bodies. 3. Facet arthropathy at L4-5 without slippage or encroachment upon the neural spaces. The facet arthropathy could be symptomatic in and of itself. 4. L5-S1: Facet arthropathy with 6 mm of anterolisthesis. No apparent compressive stenosis.  Provider: Gwendel Hanson  Narrative FINDINGS CLINICAL DATA:  LOW BACK PAIN AND LEFT RADICULAR SYMPTOMS FIVE VIEWS LUMBAR SPINE (SUPPLEMENTED WITH AP VIEW THORACIC SPINE): THE PATIENT HAS TWELVE RIBS WITH RUDIMENTARY RIBS AT T12.  THERE ARE FOUR NON-RIB-BEARING LUMBAR- TYPE VERTEBRA WITH TRANSITIONAL ANATOMY AT L5.  GRADE I ANTEROLISTHESIS L4-L5 IS DEMONSTRATED. FACET OSTEOARTHRITIC CHANGES OF L4-5 LEVEL DEMONSTRATED. IMPRESSION   Complexity Note: Imaging results reviewed. Results shared with Ms. Lira, using Layman's terms.                         ROS  Psychological-Psychiatric History: Anxiousness, Depressed and Difficulty sleeping and or falling asleep Gastrointestinal History: Vomiting blood (Ulcers), Heartburn due to stomach pushing into lungs (Hiatal hernia), Reflux or heatburn, Inflamed pancreas (Pancreatitis) and Irregular, infrequent bowel movements (Constipation) Genitourinary History: No reported renal or genitourinary signs or symptoms such as difficulty voiding or producing urine, peeing blood, non-functioning kidney, kidney stones,  difficulty emptying the bladder, difficulty controlling the flow of urine, or chronic kidney disease Hematological History: No reported hematological signs or symptoms such as prolonged bleeding, low or poor functioning platelets, bruising or bleeding easily, hereditary bleeding problems,  low energy levels due to low hemoglobin or being anemic Endocrine History: No reported endocrine signs or symptoms such as high or low blood sugar, rapid heart rate due to high thyroid levels, obesity or weight gain due to slow thyroid or thyroid disease Rheumatologic History: No reported rheumatological signs and symptoms such as fatigue, joint pain, tenderness, swelling, redness, heat, stiffness, decreased range of motion, with or without associated rash Musculoskeletal History: Negative for myasthenia gravis, muscular dystrophy, multiple sclerosis or malignant hyperthermia Work History: Retired  Allergies  Ms. Trahan has No Known Allergies.  Laboratory Chemistry Profile   Renal Lab Results  Component Value Date   BUN 12 04/11/2019   CREATININE 0.78 04/11/2019   GFRAA >60 04/11/2019   GFRNONAA >60 04/11/2019   SPECGRAV 1.015 03/15/2014   PHUR 7.0 03/15/2014   PROTEINUR NEGATIVE 04/21/2018     Electrolytes Lab Results  Component Value Date   NA 140 04/11/2019   K 4.0 04/11/2019   CL 106 04/11/2019   CALCIUM 9.0 04/11/2019   MG 1.9 04/21/2018     Hepatic Lab Results  Component Value Date   AST 16 04/21/2018   ALT 12 04/21/2018   ALBUMIN 3.6 04/21/2018   ALKPHOS 64 04/21/2018   LIPASE 27 04/21/2018     ID Lab Results  Component Value Date   SARSCOV2NAA RESULT: NEGATIVE 03/07/2019   HCVAB NEGATIVE 03/15/2014     Bone No results found for: VD25OH, RC789FY1OFB, PZ0258NI7, PO2423NT6, 25OHVITD1, 25OHVITD2, 25OHVITD3, TESTOFREE, TESTOSTERONE   Endocrine Lab Results  Component Value Date   GLUCOSE 106 (H) 04/11/2019   GLUCOSEU NEGATIVE 04/21/2018   HGBA1C 5.6 12/22/2011   TSH 1.252 11/18/2014     Neuropathy Lab Results  Component Value Date   VITAMINB12 541 03/10/2012   FOLATE 11.6 08/01/2007   HGBA1C 5.6 12/22/2011     CNS No results found for: COLORCSF, APPEARCSF, RBCCOUNTCSF, WBCCSF, POLYSCSF, LYMPHSCSF, EOSCSF, PROTEINCSF, GLUCCSF, JCVIRUS, CSFOLI,  IGGCSF, LABACHR, ACETBL, LABACHR, ACETBL   Inflammation (CRP: Acute  ESR: Chronic) Lab Results  Component Value Date   ESRSEDRATE 10 08/01/2007   LATICACIDVEN 2.2 ([place]) 10/09/2017     Rheumatology No results found for: RF, ANA, LABURIC, URICUR, LYMEIGGIGMAB, LYMEABIGMQN, HLAB27   Coagulation Lab Results  Component Value Date   INR 0.9 ratio 01/14/2010   LABPROT 9.5 (L) 01/14/2010   APTT 27.2 01/14/2010   PLT 259 04/11/2019   DDIMER 0.47 04/30/2018     Cardiovascular Lab Results  Component Value Date   BNP 59.0 04/30/2018   TROPONINI <0.03 04/30/2018   HGB 15.7 (H) 04/11/2019   HCT 46.2 (H) 04/11/2019     Screening Lab Results  Component Value Date   SARSCOV2NAA RESULT: NEGATIVE 03/07/2019   HCVAB NEGATIVE 03/15/2014     Cancer No results found for: CEA, CA125, LABCA2   Allergens No results found for: ALMOND, APPLE, ASPARAGUS, AVOCADO, BANANA, BARLEY, BASIL, BAYLEAF, GREENBEAN, LIMABEAN, WHITEBEAN, BEEFIGE, REDBEET, BLUEBERRY, BROCCOLI, CABBAGE, MELON, CARROT, CASEIN, CASHEWNUT, CAULIFLOWER, CELERY     Note: Lab results reviewed.  [place]  Drug: Ms. Salomone  reports no history of drug use. Alcohol:  reports no history of alcohol use. Tobacco:  reports that she has never smoked. She has never used smokeless tobacco. Medical:  has a past medical history  of Anxiety, Arthritis, Asthma, Blood transfusion without reported diagnosis, Fatty liver, Fatty pancreas, First degree heart block, Frequency of urination, GERD (gastroesophageal reflux disease), Hemorrhoids, Hepatic steatosis, Hiatal hernia, History of acute pancreatitis (03/16/2015), History of adenomatous polyp of colon, History of concussion (2012), History of transient ischemic attack (TIA) (12/22/2011), History of vaginal dysplasia (2016), Hypertension, Pancreatic cyst, Pseudocyst of pancreas, PSVT (paroxysmal supraventricular tachycardia) ([place]), Rectal bleeding, S/P AV (atrioventricular) nodal ablation (01-21-2010    dr  Caryl Comes), Simple renal cyst, Urgency of urination, Urinary frequency, and Urinary leakage. Family: family history includes Bone cancer in her brother and brother; Breast cancer in her cousin and maternal aunt; Breast cancer (age of onset: 77) in her mother; COPD in her father; Colon cancer in her brother and mother; Dementia in her mother; Heart disease in her brother; Heart disease (age of onset: 82) in her mother; Heart disease (age of onset: 35) in her father; Hypertension in her mother; Lung cancer in her brother; Lung cancer (age of onset: 68) in her brother; Mental illness in her brother.  Past Surgical History:  Procedure Laterality Date  . BREAST EXCISIONAL BIOPSY Right   . CARDIAC ELECTROPHYSIOLOGY MAPPING AND ABLATION  01-21-2010   dr Caryl Comes   ablation AVNRT  . FOOT NEUROMA SURGERY Left 1996  . HEMORRHOID SURGERY N/A 10/28/2016   Procedure: HEMORRHOIDECTOMY AND HEMORRHOIDAL PEXY;  Surgeon: Leighton Ruff, MD;  Location: [facility];  Service: General;  Laterality: N/A;  . LAPAROSCOPY TAKEDOWN AND REPAIR HIATAL HERNIA /  NISSEN FUNDOPLATION  09-01-2005    dr Hassell Done  [facility]  . LEFT HEART CATH AND CORONARY ANGIOGRAPHY N/A 01/17/2020   Procedure: LEFT HEART CATH AND CORONARY ANGIOGRAPHY;  Surgeon: Dionisio David, MD;  Location: [place] [facility];  Service: Cardiovascular;  Laterality: N/A;  . NASAL SEPTUM SURGERY  1991  . TEE WITHOUT CARDIOVERSION  02/15/2012   Procedure: TRANSESOPHAGEAL ECHOCARDIOGRAM (TEE);  Surgeon: Laverda Page, MD;  Location: [facility];  Service: Cardiovascular;  Laterality: N/A;  normal LV, normal EF, mild MR, trace TR, trace PI  . TRANSTHORACIC ECHOCARDIOGRAM  12-22-2011    dr Einar Gip   normal echo  . VAGINAL HYSTERECTOMY  1996   w/  BSO   Active Ambulatory Problems    Diagnosis Date Noted  . HYPERTENSION 03/14/2007  . ESOPHAGEAL STRICTURE 07/31/2007  . G E R D 03/14/2007  . HIATAL HERNIA 07/31/2007  . RECTAL BLEEDING 08/01/2007  .  DIARRHEA 08/01/2007  . COLONIC POLYPS, HX OF 07/31/2007  . SVT/ PSVT/ PAT 01/13/2010  . TIA (transient ischemic attack) 12/22/2011  . VAIN I (vaginal intraepithelial neoplasia grade I) 04/17/2014  . Pancreatitis 03/16/2015  . Pancreatitis, acute   . Epigastric abdominal pain   . Acute pancreatitis   . Cough   . Chest pain 01/16/2020  . Unstable angina ([place]) 01/16/2020  . Lumbar spondylosis 04/29/2020  . Lumbar degenerative disc disease 04/29/2020  . Chronic pain syndrome 04/29/2020   Resolved Ambulatory Problems    Diagnosis Date Noted  . HYPERLIPIDEMIA 03/16/2007  . Cough 03/16/2007   Past Medical History:  Diagnosis Date  . Anxiety   . Arthritis   . Asthma   . Blood transfusion without reported diagnosis   . Fatty liver   . Fatty pancreas   . First degree heart block   . Frequency of urination   . GERD (gastroesophageal reflux disease)   . Hemorrhoids   . Hepatic steatosis   . Hiatal hernia   . History  of acute pancreatitis 03/16/2015  . History of adenomatous polyp of colon   . History of concussion 2012  . History of transient ischemic attack (TIA) 12/22/2011  . History of vaginal dysplasia 2016  . Hypertension   . Pancreatic cyst   . Pseudocyst of pancreas   . PSVT (paroxysmal supraventricular tachycardia) ([place])   . Rectal bleeding   . S/P AV (atrioventricular) nodal ablation 01-21-2010    dr Caryl Comes  . Simple renal cyst   . Urgency of urination   . Urinary frequency   . Urinary leakage    Constitutional Exam  General appearance: Well nourished, well developed, and well hydrated. In no apparent acute distress Vitals:   04/29/20 1301  BP: 136/75  Pulse: 66  Resp: 14  Temp: (!) 96.6 F (35.9 C)  TempSrc: Temporal  SpO2: 97%  Weight: 179 lb (81.2 kg)  Height: 5' (1.524 m)  HC: 1" (2.5 cm)   BMI Assessment: Estimated body mass index is 34.96 kg/m as calculated from the following:   Height as of this encounter: 5' (1.524 m).   Weight as of this  encounter: 179 lb (81.2 kg).  BMI interpretation table: BMI level Category Range association with higher incidence of chronic pain  <18 kg/m2 Underweight   18.5-24.9 kg/m2 Ideal body weight   25-29.9 kg/m2 Overweight Increased incidence by 20%  30-34.9 kg/m2 Obese (Class I) Increased incidence by 68%  35-39.9 kg/m2 Severe obesity (Class II) Increased incidence by 136%  >40 kg/m2 Extreme obesity (Class III) Increased incidence by 254%   Patient's current BMI Ideal Body weight  Body mass index is 34.96 kg/m. Ideal body weight: 45.5 kg (100 lb 4.9 oz) Adjusted ideal body weight: 59.8 kg (131 lb 12.6 oz)   BMI Readings from Last 4 Encounters:  04/29/20 34.96 kg/m  01/17/20 33.44 kg/m  04/11/19 33.25 kg/m  03/09/19 33.20 kg/m   Wt Readings from Last 4 Encounters:  04/29/20 179 lb (81.2 kg)  01/17/20 177 lb (80.3 kg)  04/11/19 176 lb (79.8 kg)  03/09/19 170 lb (77.1 kg)    Psych/Mental status: Alert, oriented x 3 (person, place, & time)       Eyes: PERLA Respiratory: No evidence of acute respiratory distress   Lumbar Spine Area Exam  Skin & Axial Inspection: No masses, redness, or swelling Alignment: Symmetrical Functional ROM: Unrestricted ROM      Stability: No instability detected Muscle Tone/Strength: Functionally intact. No obvious neuro-muscular anomalies detected. Sensory (Neurological): Unimpaired Palpation: Complains of area being tender to palpation Bilateral Fist Percussion Test Provocative Tests: Lumbar Hyperextension and rotation test: Positive bilaterally for facet joint pain. Lumbar Lateral bending test: Positive due to pain. Patrick's Maneuver: evaluation deferred today                    Gait & Posture Assessment  Ambulation: Unassisted Gait: Relatively normal for age and body habitus Posture: WNL   Lower Extremity Exam    Side: Right lower extremity  Side: Left lower extremity  Skin & Extremity Inspection: Skin color, temperature, and hair  growth are WNL. No peripheral edema or cyanosis. No masses, redness, swelling, asymmetry, or associated skin lesions. No contractures.  Skin & Extremity Inspection: Skin color, temperature, and hair growth are WNL. No peripheral edema or cyanosis. No masses, redness, swelling, asymmetry, or associated skin lesions. No contractures.  Functional ROM: Unrestricted ROM          Functional ROM: Unrestricted ROM  Muscle Tone/Strength: Functionally intact. No obvious neuro-muscular anomalies detected.  Muscle Tone/Strength: Functionally intact. No obvious neuro-muscular anomalies detected.  Sensory (Neurological): Unimpaired  Sensory (Neurological): Unimpaired  Palpation: No palpable anomalies  Palpation: No palpable anomalies     Assessment  Primary Diagnosis & Pertinent Problem List: The primary encounter diagnosis was Lumbar facet arthropathy. Diagnoses of Lumbar spondylosis, Lumbar degenerative disc disease, and Chronic pain syndrome were also pertinent to this visit.  Visit Diagnosis (New problems to examiner): 1. Lumbar facet arthropathy   2. Lumbar spondylosis   3. Lumbar degenerative disc disease   4. Chronic pain syndrome    Plan of Care (Initial workup plan)   Status post radiofrequency ablation more than 2 years ago on right side at L3, L4, L5 on 06/29/2017 and on the left side at L3, L4, L5 on 08/03/2017.  Patient states that she did exceptionally well for approximately 2 years with almost 95% reduction in her low back pain, improvement in her functional status and a decrease in utilization of as needed over-the-counter pain medications including NSAIDs and acetaminophen.  She states that over the last 4 to 5 weeks, her pain has gone worse and she has pain with lumbar extension and lateral rotation.  Given that has been over 2 years since her previous lumbar radiofrequency ablation, recommend repeating as the patient has likely had regeneration of her medial branch nerves.  Risks  and benefits of this procedure were discussed.  Patient instructed to stop her aspirin 325 7 days prior to procedure and transition to 81 mg aspirin.  Of note, she has completed 6 weeks of physical therapy in the past with limited response to her low back pain.  She tries to do home stretching exercises at home.  She takes ibuprofen and acetaminophen as needed.  Repeat lumbar radiofrequency ablation for lumbar facet arthropathy, lumbar spondylosis.   Procedure Orders     Radiofrequency,Lumbar     Radiofrequency,Lumbar   Provider-requested follow-up: Return in about 2 weeks (around 05/13/2020) for R L3,4,5 RFA with sedation.  No future appointments.  Note by: Gillis Santa, MD Date: 04/29/2020; Time: 2:10 PM

## 2020-05-07 ENCOUNTER — Other Ambulatory Visit: Payer: Self-pay

## 2020-05-07 ENCOUNTER — Ambulatory Visit: Payer: Medicare HMO | Admitting: Student in an Organized Health Care Education/Training Program

## 2020-05-07 ENCOUNTER — Ambulatory Visit (HOSPITAL_BASED_OUTPATIENT_CLINIC_OR_DEPARTMENT_OTHER): Payer: Medicare HMO | Admitting: Student in an Organized Health Care Education/Training Program

## 2020-05-07 ENCOUNTER — Encounter: Payer: Self-pay | Admitting: Student in an Organized Health Care Education/Training Program

## 2020-05-07 ENCOUNTER — Ambulatory Visit
Admission: RE | Admit: 2020-05-07 | Discharge: 2020-05-07 | Disposition: A | Discharge status: Home / Self Care | Payer: Medicare HMO | Source: Ambulatory Visit | Attending: Student in an Organized Health Care Education/Training Program | Admitting: Student in an Organized Health Care Education/Training Program

## 2020-05-07 VITALS — BP 115/73 | HR 71 | Temp 97.5°F | Resp 21 | Ht 61.0 in | Wt 179.0 lb

## 2020-05-07 DIAGNOSIS — G894 Chronic pain syndrome: Secondary | ICD-10-CM | POA: Insufficient documentation

## 2020-05-07 DIAGNOSIS — M47816 Spondylosis without myelopathy or radiculopathy, lumbar region: Secondary | ICD-10-CM | POA: Insufficient documentation

## 2020-05-07 DIAGNOSIS — M5136 Other intervertebral disc degeneration, lumbar region: Secondary | ICD-10-CM | POA: Insufficient documentation

## 2020-05-07 MED ORDER — IOHEXOL 180 MG/ML  SOLN: 10.0000 mL | Freq: Once | INTRAMUSCULAR | Status: DC

## 2020-05-07 MED ORDER — LIDOCAINE HCL 2 % IJ SOLN
20.0000 mL | Freq: Once | INTRAMUSCULAR | Status: DC
  Filled 2020-05-07: qty 40

## 2020-05-07 MED ORDER — ROPIVACAINE HCL 2 MG/ML IJ SOLN
9.0000 mL | Freq: Once | INTRAMUSCULAR | Status: DC
  Filled 2020-05-07: qty 10

## 2020-05-07 MED ORDER — FENTANYL CITRATE (PF) 100 MCG/2ML IJ SOLN
25.0000 ug | INTRAMUSCULAR | Status: DC | PRN
  Administered 2020-05-07: 100 ug via INTRAVENOUS
  Filled 2020-05-07: qty 2

## 2020-05-07 MED ORDER — DEXAMETHASONE SODIUM PHOSPHATE 10 MG/ML IJ SOLN
10.0000 mg | Freq: Once | INTRAMUSCULAR | Status: DC
  Filled 2020-05-07: qty 1

## 2020-05-07 NOTE — Progress Notes (Signed)
Safety precautions to be maintained throughout the outpatient stay will include: orient to surroundings, keep bed in low position, maintain call bell within reach at all times, provide assistance with transfer out of bed and ambulation.  

## 2020-05-07 NOTE — Progress Notes (Signed)
PROVIDER NOTE: Information contained herein reflects review and annotations entered in association with encounter. Interpretation of such information and data should be left to medically-trained personnel. Information provided to patient can be located elsewhere in the medical record under "Patient Instructions". Document created using STT-dictation technology, any transcriptional errors that may result from process are unintentional.    Patient: Pamela Daniel  Service Category: Procedure  Provider: Gillis Santa, MD  DOB: December 17, 1950  DOS: 05/07/2020  Location: [facility]  MRN: [id number]  Setting: Ambulatory - outpatient  Referring Provider: Velna Hatchet, MD  Type: Established Patient  Specialty: Interventional Pain Management  PCP: Velna Hatchet, MD   Primary Reason for Visit: Interventional Pain Management Treatment. CC: Back Pain  Procedure:          Anesthesia, Analgesia, Anxiolysis:  Type: Thermal Lumbar Facet, Medial Branch Radiofrequency Ablation/Neurotomy  #1  Primary Purpose: Therapeutic Region: Posterolateral Lumbosacral Spine Level:L3, L4, L5,  Medial Branch Level(s). These levels will denervate the L3-4, L4-5,lumbar facet joints. Laterality: Right  Type: Moderate (Conscious) Sedation combined with Local Anesthesia Indication(s): Analgesia and Anxiety Route: Intravenous (IV) IV Access: Secured Sedation: Meaningful verbal contact was maintained at all times during the procedure  Local Anesthetic: Lidocaine 1-2%  Position: Prone   Indications: 1. Lumbar facet arthropathy   2. Lumbar spondylosis   3. Lumbar degenerative disc disease   4. Chronic pain syndrome    [name] has been dealing with the above chronic pain for longer than three months and has either failed to respond, was unable to tolerate, or simply did not get enough benefit from other more conservative therapies including, but not limited to: 1. Over-the-counter medications 2.  Anti-inflammatory medications 3. Muscle relaxants 4. Membrane stabilizers 5. Opioids 6. Physical therapy and/or chiropractic manipulation 7. Modalities (Heat, ice, etc.) 8. Invasive techniques such as nerve blocks. [name] has attained more than 50% relief of the pain from a series of diagnostic injections conducted in separate occasions.  Pain Score: Pre-procedure: 1 /10 Post-procedure: 0-No pain/10   Pre-op H&P Assessment:  Pamela Daniel is a 70 y.o. (year old), female patient, seen today for interventional treatment. She  has a past surgical history that includes Cardiac electrophysiology mapping and ablation (01-21-2010   dr Caryl Comes); Foot neuroma surgery (Left, 1996); Nasal septum surgery (1991); TEE without cardioversion (02/15/2012); Vaginal hysterectomy (1996); LAPAROSCOPY TAKEDOWN AND REPAIR HIATAL HERNIA /  NISSEN FUNDOPLATION (09-01-2005    dr Hassell Done  [facility]); transthoracic echocardiogram (12-22-2011    dr Einar Gip); Hemorrhoid surgery (N/A, 10/28/2016); Breast excisional biopsy (Right); and LEFT HEART CATH AND CORONARY ANGIOGRAPHY (N/A, 01/17/2020). Pamela Daniel has a current medication list which includes the following prescription(s): acetaminophen, albuterol, alprazolam, aspirin, budesonide-formoterol, vitamin d3, ibuprofen, nitroglycerin, pantoprazole, rosuvastatin, and spironolactone, and the following Facility-Administered Medications: dexamethasone, fentanyl, iohexol, lidocaine, ropivacaine (pf) 2 mg/ml (0.2%), and sodium chloride flush. Her primarily concern today is the Back Pain  Initial Vital Signs:  Pulse/HCG Rate: 71ECG Heart Rate: 62 Temp: (!) 97.1 F (36.2 C) Resp: 17 BP: (!) 150/69 SpO2: 98 %  BMI: Estimated body mass index is 33.82 kg/m as calculated from the following:   Height as of this encounter: 5\' 1"  (1.549 m).   Weight as of this encounter: 179 lb (81.2 kg).  Risk Assessment: Allergies: Reviewed. She has No Known Allergies.  Allergy Precautions: None  required Coagulopathies: Reviewed. None identified.  Blood-thinner therapy: None at this time Active Infection(s): Reviewed. None identified. Pamela Daniel is afebrile  Site Confirmation: Pamela Daniel  was asked to confirm the procedure and laterality before marking the site Procedure checklist: Completed Consent: Before the procedure and under the influence of no sedative(s), amnesic(s), or anxiolytics, the patient was informed of the treatment options, risks and possible complications. To fulfill our ethical and legal obligations, as recommended by the American Medical Association's Code of Ethics, I have informed the patient of my clinical impression; the nature and purpose of the treatment or procedure; the risks, benefits, and possible complications of the intervention; the alternatives, including doing nothing; the risk(s) and benefit(s) of the alternative treatment(s) or procedure(s); and the risk(s) and benefit(s) of doing nothing. The patient was provided information about the general risks and possible complications associated with the procedure. These may include, but are not limited to: failure to achieve desired goals, infection, bleeding, organ or nerve damage, allergic reactions, paralysis, and death. In addition, the patient was informed of those risks and complications associated to Spine-related procedures, such as failure to decrease pain; infection (i.e.: Meningitis, epidural or intraspinal abscess); bleeding (i.e.: epidural hematoma, subarachnoid hemorrhage, or any other type of intraspinal or peri-dural bleeding); organ or nerve damage (i.e.: Any type of peripheral nerve, nerve root, or spinal cord injury) with subsequent damage to sensory, motor, and/or autonomic systems, resulting in permanent pain, numbness, and/or weakness of one or several areas of the body; allergic reactions; (i.e.: anaphylactic reaction); and/or death. Furthermore, the patient was informed of those risks and  complications associated with the medications. These include, but are not limited to: allergic reactions (i.e.: anaphylactic or anaphylactoid reaction(s)); adrenal axis suppression; blood sugar elevation that in diabetics may result in ketoacidosis or comma; water retention that in patients with history of congestive heart failure may result in shortness of breath, pulmonary edema, and decompensation with resultant heart failure; weight gain; swelling or edema; medication-induced neural toxicity; particulate matter embolism and blood vessel occlusion with resultant organ, and/or nervous system infarction; and/or aseptic necrosis of one or more joints. Finally, the patient was informed that Medicine is not an exact science; therefore, there is also the possibility of unforeseen or unpredictable risks and/or possible complications that may result in a catastrophic outcome. The patient indicated having understood very clearly. We have given the patient no guarantees and we have made no promises. Enough time was given to the patient to ask questions, all of which were answered to the patient's satisfaction. Ms. Ramey has indicated that she wanted to continue with the procedure. Attestation: I, the ordering provider, attest that I have discussed with the patient the benefits, risks, side-effects, alternatives, likelihood of achieving goals, and potential problems during recovery for the procedure that I have provided informed consent. Date  Time: 05/07/2020  9:15 AM  Pre-Procedure Preparation:  Monitoring: As per clinic protocol. Respiration, ETCO2, SpO2, BP, heart rate and rhythm monitor placed and checked for adequate function Safety Precautions: Patient was assessed for positional comfort and pressure points before starting the procedure. Time-out: I initiated and conducted the "Time-out" before starting the procedure, as per protocol. The patient was asked to participate by confirming the accuracy of the  "Time Out" information. Verification of the correct person, site, and procedure were performed and confirmed by me, the nursing staff, and the patient. "Time-out" conducted as per Joint Commission's Universal Protocol (UP.01.01.01). Time: 1019  Description of Procedure:          Laterality: Right Levels:L3, L4, L5, Medial Branch Level(s), at the L3-4, L4-5, umbar facet joints. Area Prepped: Lumbosacral DuraPrep (Iodine Povacrylex [0.7% available  iodine] and Isopropyl Alcohol, 74% w/w) Safety Precautions: Aspiration looking for blood return was conducted prior to all injections. At no point did we inject any substances, as a needle was being advanced. Before injecting, the patient was told to immediately notify me if she was experiencing any new onset of "ringing in the ears, or metallic taste in the mouth". No attempts were made at seeking any paresthesias. Safe injection practices and needle disposal techniques used. Medications properly checked for expiration dates. SDV (single dose vial) medications used. After the completion of the procedure, all disposable equipment used was discarded in the proper designated medical waste containers. Local Anesthesia: Protocol guidelines were followed. The patient was positioned over the fluoroscopy table. The area was prepped in the usual manner. The time-out was completed. The target area was identified using fluoroscopy. A 12-in long, straight, sterile hemostat was used with fluoroscopic guidance to locate the targets for each level blocked. Once located, the skin was marked with an approved surgical skin marker. Once all sites were marked, the skin (epidermis, dermis, and hypodermis), as well as deeper tissues (fat, connective tissue and muscle) were infiltrated with a small amount of a short-acting local anesthetic, loaded on a 10cc syringe with a 25G, 1.5-in  Needle. An appropriate amount of time was allowed for local anesthetics to take effect before proceeding  to the next step. Local Anesthetic: Lidocaine 2.0% The unused portion of the local anesthetic was discarded in the proper designated containers. Technical explanation of process:  Radiofrequency Ablation (RFA) L3 Medial Branch Nerve RFA: The target area for the L3 medial branch is at the junction of the postero-lateral aspect of the superior articular process and the superior, posterior, and medial edge of the transverse process of L4. Under fluoroscopic guidance, a Radiofrequency needle was inserted until contact was made with os over the superior postero-lateral aspect of the pedicular shadow (target area). Sensory and motor testing was conducted to properly adjust the position of the needle. Once satisfactory placement of the needle was achieved, the numbing solution was slowly injected after negative aspiration for blood. 2.0 mL of the nerve block solution was injected without difficulty or complication. After waiting for at least 3 minutes, the ablation was performed. Once completed, the needle was removed intact. L4 Medial Branch Nerve RFA: The target area for the L4 medial branch is at the junction of the postero-lateral aspect of the superior articular process and the superior, posterior, and medial edge of the transverse process of L5. Under fluoroscopic guidance, a Radiofrequency needle was inserted until contact was made with os over the superior postero-lateral aspect of the pedicular shadow (target area). Sensory and motor testing was conducted to properly adjust the position of the needle. Once satisfactory placement of the needle was achieved, the numbing solution was slowly injected after negative aspiration for blood. 2.0 mL of the nerve block solution was injected without difficulty or complication. After waiting for at least 3 minutes, the ablation was performed. Once completed, the needle was removed intact. L5 Medial Branch Nerve RFA: The target area for the L5 medial branch is at the  junction of the postero-lateral aspect of the superior articular process of S1 and the superior, posterior, and medial edge of the sacral ala. Under fluoroscopic guidance, a Radiofrequency needle was inserted until contact was made with os over the superior postero-lateral aspect of the pedicular shadow (target area). Sensory and motor testing was conducted to properly adjust the position of the needle. Once satisfactory placement of  the needle was achieved, the numbing solution was slowly injected after negative aspiration for blood. 2.0 mL of the nerve block solution was injected without difficulty or complication. After waiting for at least 3 minutes, the ablation was performed. Once completed, the needle was removed intact.  Radiofrequency lesioning (ablation):  Radiofrequency Generator: NeuroTherm [id number] Sensory Stimulation Parameters: 50 Hz was used to locate & identify the nerve, making sure that the needle was positioned such that there was no sensory stimulation below 0.3 V or above 0.7 V. Motor Stimulation Parameters: 2 Hz was used to evaluate the motor component. Care was taken not to lesion any nerves that demonstrated motor stimulation of the lower extremities at an output of less than 2.5 times that of the sensory threshold, or a maximum of 2.0 V. Lesioning Technique Parameters: Standard Radiofrequency settings. (Not bipolar or pulsed.) Temperature Settings: 80 degrees C Lesioning time: 60 seconds Intra-operative Compliance: Compliant Materials & Medications: Needle(s) (Electrode/Cannula) Type: Teflon-coated, curved tip, Radiofrequency needle(s) Gauge: 22G Length: 10cm Numbing solution: 6 cc solution made of 5 cc of 0.2% Ropivacaine and 1 cc of Decadron 10 mg/cc. 2 cc injected at each level above on the right prior to lesioning after sensorimotor testing.  Once the entire procedure was completed, the treated area was cleaned, making sure to leave some of the prepping solution back to  take advantage of its long term bactericidal properties.    Illustration of the posterior view of the lumbar spine and the posterior neural structures. Laminae of L2 through S1 are labeled. DPRL5, dorsal primary ramus of L5; DPRS1, dorsal primary ramus of S1; DPR3, dorsal primary ramus of L3; FJ, facet (zygapophyseal) joint L3-L4; I, inferior articular process of L4; LB1, lateral branch of dorsal primary ramus of L1; IAB, inferior articular branches from L3 medial branch (supplies L4-L5 facet joint); IBP, intermediate branch plexus; MB3, medial branch of dorsal primary ramus of L3; NR3, third lumbar nerve root; S, superior articular process of L5; SAB, superior articular branches from L4 (supplies L4-5 facet joint also); TP3, transverse process of L3.  Vitals:   05/07/20 1032 05/07/20 1040 05/07/20 1050 05/07/20 1100  BP: (!) 160/75 (!) 139/105 (!) 70/54 115/73  Pulse:      Resp: 18 11 17  (!) 21  Temp:  98 F (36.7 C)  (!) 97.5 F (36.4 C)  TempSrc:  Temporal  Temporal  SpO2: 99% 96% 97% 97%  Weight:      Height:       Start Time: 1019 hrs. End Time: 1032 hrs.  Imaging Guidance (Spinal):          Type of Imaging Technique: Fluoroscopy Guidance (Spinal) Indication(s): Assistance in needle guidance and placement for procedures requiring needle placement in or near specific anatomical locations not easily accessible without such assistance. Exposure Time: Please see nurses notes. Contrast: None used. Fluoroscopic Guidance: I was personally present during the use of fluoroscopy. "Tunnel Vision Technique" used to obtain the best possible view of the target area. Parallax error corrected before commencing the procedure. "Direction-depth-direction" technique used to introduce the needle under continuous pulsed fluoroscopy. Once target was reached, antero-posterior, oblique, and lateral fluoroscopic projection used confirm needle placement in all planes. Images permanently stored in  EMR. Interpretation: No contrast injected. I personally interpreted the imaging intraoperatively. Adequate needle placement confirmed in multiple planes. Permanent images saved into the patient's record.  Post-operative Assessment:  Post-procedure Vital Signs:  Pulse/HCG Rate: 71(!) 59 Temp: (!) 97.5 F (36.4 C) Resp: (!) 21 BP:  115/73 SpO2: 97 %  EBL: None  Complications: No immediate post-treatment complications observed by team, or reported by patient.  Note: The patient tolerated the entire procedure well. A repeat set of vitals were taken after the procedure and the patient was kept under observation following institutional policy, for this type of procedure. Post-procedural neurological assessment was performed, showing return to baseline, prior to discharge. The patient was provided with post-procedure discharge instructions, including a section on how to identify potential problems. Should any problems arise concerning this procedure, the patient was given instructions to immediately contact us, at any time, without hesitation. In any case, we plan to contact the patient by telephone for a follow-up status report regarding this interventional procedure.  Comments:  No additional relevant information.  Plan of Care  Orders:  Orders Placed This Encounter  Procedures  . [facility] C-ARM 1-60 MIN NO REPORT    Intraoperative interpretation by procedural physician at [place].    Standing Status:   Standing    Number of Occurrences:   1    Order Specific Question:   Reason for exam:    Answer:   Assistance in needle guidance and placement for procedures requiring needle placement in or near specific anatomical locations not easily accessible without such assistance.   Medications ordered for procedure: Meds ordered this encounter  Medications  . iohexol (OMNIPAQUE) 180 MG/ML injection 10 mL    Must be Myelogram-compatible. If not available, you may substitute  with a water-soluble, non-ionic, hypoallergenic, myelogram-compatible radiological contrast medium.  Marland Kitchen lidocaine (XYLOCAINE) 2 % (with pres) injection 400 mg  . fentaNYL (SUBLIMAZE) injection 25-50 mcg    Make sure Narcan is available in the pyxis when using this medication. In the event of respiratory depression (RR< 8/min): Titrate NARCAN (naloxone) in increments of 0.1 to 0.2 mg IV at 2-3 minute intervals, until desired degree of reversal.  . ropivacaine (PF) 2 mg/mL (0.2%) (NAROPIN) injection 9 mL  . dexamethasone (DECADRON) injection 10 mg   Medications administered: We administered fentaNYL.  See the medical record for exact dosing, route, and time of administration.  Follow-up plan:   Return for Keep sch. appt, Contra-lateral Procedure.    Recent Visits Date Type Provider Dept  04/29/20 Office Visit Gillis Santa, MD [facility]  Showing recent visits within past 90 days and meeting all other requirements Today's Visits Date Type Provider Dept  05/07/20 Procedure visit Gillis Santa, MD [facility]  Showing today's visits and meeting all other requirements Future Appointments Date Type Provider Dept  05/28/20 Appointment Gillis Santa, MD [facility]  Showing future appointments within next 90 days and meeting all other requirements  Disposition: Discharge home  Discharge (Date  Time): 05/07/2020; 1101 hrs.   Primary Care Physician: Velna Hatchet, MD Location: [facility] Note by: Gillis Santa, MD Date: 05/07/2020; Time: 11:46 AM  Disclaimer:  Medicine is not an exact science. The only guarantee in medicine is that nothing is guaranteed. It is important to note that the decision to proceed with this intervention was based on the information collected from the patient. The Data and conclusions were drawn from the patient's questionnaire, the interview, and the physical examination. Because the information was  provided in large part by the patient, it cannot be guaranteed that it has not been purposely or unconsciously manipulated. Every effort has been made to obtain as much relevant data as possible for this evaluation. It is important to  note that the conclusions that lead to this procedure are derived in large part from the available data. Always take into account that the treatment will also be dependent on availability of resources and existing treatment guidelines, considered by other Pain Management Practitioners as being common knowledge and practice, at the time of the intervention. For Medico-Legal purposes, it is also important to point out that variation in procedural techniques and pharmacological choices are the acceptable norm. The indications, contraindications, technique, and results of the above procedure should only be interpreted and judged by a Board-Certified Interventional Pain Specialist with extensive familiarity and expertise in the same exact procedure and technique.

## 2020-05-07 NOTE — Patient Instructions (Signed)

## 2020-05-08 ENCOUNTER — Telehealth: Payer: Self-pay | Admitting: *Deleted

## 2020-05-08 DIAGNOSIS — I10 Essential (primary) hypertension: Secondary | ICD-10-CM | POA: Diagnosis not present

## 2020-05-08 DIAGNOSIS — I2510 Atherosclerotic heart disease of native coronary artery without angina pectoris: Secondary | ICD-10-CM | POA: Diagnosis not present

## 2020-05-08 DIAGNOSIS — I340 Nonrheumatic mitral (valve) insufficiency: Secondary | ICD-10-CM | POA: Diagnosis not present

## 2020-05-08 DIAGNOSIS — R0602 Shortness of breath: Secondary | ICD-10-CM | POA: Diagnosis not present

## 2020-05-08 NOTE — Telephone Encounter (Signed)
No problems post procedure. 

## 2020-05-15 DIAGNOSIS — G4733 Obstructive sleep apnea (adult) (pediatric): Secondary | ICD-10-CM | POA: Diagnosis not present

## 2020-05-28 ENCOUNTER — Other Ambulatory Visit: Payer: Self-pay

## 2020-05-28 ENCOUNTER — Ambulatory Visit
Admission: RE | Admit: 2020-05-28 | Discharge: 2020-05-28 | Disposition: A | Discharge status: Home / Self Care | Payer: Medicare HMO | Source: Ambulatory Visit | Attending: Student in an Organized Health Care Education/Training Program | Admitting: Student in an Organized Health Care Education/Training Program

## 2020-05-28 ENCOUNTER — Ambulatory Visit (HOSPITAL_BASED_OUTPATIENT_CLINIC_OR_DEPARTMENT_OTHER): Payer: Medicare HMO | Admitting: Student in an Organized Health Care Education/Training Program

## 2020-05-28 ENCOUNTER — Ambulatory Visit: Payer: Medicare HMO | Admitting: Student in an Organized Health Care Education/Training Program

## 2020-05-28 ENCOUNTER — Encounter: Payer: Self-pay | Admitting: Student in an Organized Health Care Education/Training Program

## 2020-05-28 DIAGNOSIS — M47816 Spondylosis without myelopathy or radiculopathy, lumbar region: Secondary | ICD-10-CM

## 2020-05-28 DIAGNOSIS — G894 Chronic pain syndrome: Secondary | ICD-10-CM

## 2020-05-28 MED ORDER — ROPIVACAINE HCL 2 MG/ML IJ SOLN
9.0000 mL | Freq: Once | INTRAMUSCULAR | Status: AC
  Administered 2020-05-28: 10 mL via PERINEURAL

## 2020-05-28 MED ORDER — FENTANYL CITRATE (PF) 100 MCG/2ML IJ SOLN
INTRAMUSCULAR | Status: AC
  Filled 2020-05-28: qty 2

## 2020-05-28 MED ORDER — ROPIVACAINE HCL 2 MG/ML IJ SOLN
INTRAMUSCULAR | Status: AC
  Filled 2020-05-28: qty 10

## 2020-05-28 MED ORDER — DEXAMETHASONE SODIUM PHOSPHATE 10 MG/ML IJ SOLN
INTRAMUSCULAR | Status: AC
  Filled 2020-05-28: qty 1

## 2020-05-28 MED ORDER — FENTANYL CITRATE (PF) 100 MCG/2ML IJ SOLN
25.0000 ug | INTRAMUSCULAR | Status: DC | PRN
  Administered 2020-05-28: 100 ug via INTRAVENOUS

## 2020-05-28 MED ORDER — LIDOCAINE HCL 2 % IJ SOLN
20.0000 mL | Freq: Once | INTRAMUSCULAR | Status: AC
  Administered 2020-05-28: 400 mg

## 2020-05-28 MED ORDER — DEXAMETHASONE SODIUM PHOSPHATE 10 MG/ML IJ SOLN
10.0000 mg | Freq: Once | INTRAMUSCULAR | Status: AC
  Administered 2020-05-28: 10 mg

## 2020-05-28 MED ORDER — LIDOCAINE HCL 2 % IJ SOLN
INTRAMUSCULAR | Status: AC
  Filled 2020-05-28: qty 20

## 2020-05-28 NOTE — Patient Instructions (Signed)
Pain Management Discharge Instructions  General Discharge Instructions :  If you need to reach your doctor call: Monday-Friday 8:00 am - 4:00 pm at [phone] or toll free [phone].  After clinic hours [phone] to have operator reach doctor.  Bring all of your medication bottles to all your appointments in the pain clinic.  To cancel or reschedule your appointment with Pain Management please remember to call 24 hours in advance to avoid a fee.  Refer to the educational materials which you have been given on: General Risks, I had my Procedure. Discharge Instructions, Post Sedation.  Post Procedure Instructions:  The drugs you were given will stay in your system until tomorrow, so for the next 24 hours you should not drive, make any legal decisions or drink any alcoholic beverages.  You may eat anything you prefer, but it is better to start with liquids then soups and crackers, and gradually work up to solid foods.  Please notify your doctor immediately if you have any unusual bleeding, trouble breathing or pain that is not related to your normal pain.  Depending on the type of procedure that was done, some parts of your body may feel week and/or numb.  This usually clears up by tonight or the next day.  Walk with the use of an assistive device or accompanied by an adult for the 24 hours.  You may use ice on the affected area for the first 24 hours.  Put ice in a Ziploc bag and cover with a towel and place against area 15 minutes on 15 minutes off.  You may switch to heat after 24 hours.Radiofrequency Lesioning Radiofrequency lesioning is a procedure that is performed to relieve pain. The procedure is often used for back, neck, or arm pain. Radiofrequency lesioning involves the use of a machine that creates radio waves to make heat. During the procedure, the heat is applied to the nerve that carries the pain signal. The heat damages the nerve and interferes with the pain signal.  Pain relief usually starts about 2 weeks after the procedure and lasts for 6 months to 1 year. You will be awake during the procedure. You will need to be able to talk with the health care provider during the procedure. Tell a health care provider about:  Any allergies you have.  All medicines you are taking, including vitamins, herbs, eye drops, creams, and over-the-counter medicines.  Any problems you or family members have had with anesthetic medicines.  Any blood disorders you have.  Any surgeries you have had.  Any medical conditions you have or have had.  Whether you are pregnant or may be pregnant. What are the risks? Generally, this is a safe procedure. However, problems may occur, including:  Pain or soreness at the injection site.  Allergic reaction to medicines given during the procedure.  Bleeding.  Infection at the injection site.  Damage to nerves or blood vessels. What happens before the procedure? Staying hydrated Follow instructions from your health care provider about hydration, which may include:  Up to 2 hours before the procedure - you may continue to drink clear liquids, such as water, clear fruit juice, black coffee, and plain tea. Eating and drinking Follow instructions from your health care provider about eating and drinking, which may include:  8 hours before the procedure - stop eating heavy meals or foods, such as meat, fried foods, or fatty foods.  6 hours before the procedure - stop eating light meals or foods, such as toast   or cereal.  6 hours before the procedure - stop drinking milk or drinks that contain milk.  2 hours before the procedure - stop drinking clear liquids. Medicines Ask your health care provider about:  Changing or stopping your regular medicines. This is especially important if you are taking diabetes medicines or blood thinners.  Taking medicines such as aspirin and ibuprofen. These medicines can thin your blood. Do  not take these medicines unless your health care provider tells you to take them.  Taking over-the-counter medicines, vitamins, herbs, and supplements. General instructions  Plan to have someone take you home from the hospital or clinic.  If you will be going home right after the procedure, plan to have someone with you for 24 hours.  Ask your health care provider what steps will be taken to help prevent infection. These may include: ? Removing hair at the procedure site. ? Washing skin with a germ-killing soap. ? Taking antibiotic medicine. What happens during the procedure?  An IV will be inserted into one of your veins.  You will be given one or more of the following: ? A medicine to help you relax (sedative). ? A medicine to numb the area (local anesthetic).  Your health care provider will insert a radiofrequency needle into the area to be treated. This is done with the help of a type of X-ray (fluoroscopy).  A wire that carries the radio waves (electrode) will be put through the radiofrequency needle.  An electrical pulse will be sent through the electrode to verify the correct nerve that is causing your pain. You will feel a tingling sensation, and you may have muscle twitching.  The tissue around the needle tip will be heated by an electric current that comes from the radiofrequency machine. This will numb the nerves.  The needle will be removed.  A bandage (dressing) will be put on the insertion area. The procedure may vary among health care providers and hospitals.   What happens after the procedure?  Your blood pressure, heart rate, breathing rate, and blood oxygen level will be monitored until you leave the hospital or clinic.  Return to your normal activities as told by your health care provider. Ask your health care provider what activities are safe for you.  Do not drive for 24 hours if you were given a sedative during your procedure. Summary  Radiofrequency  lesioning is a procedure that is performed to relieve pain. The procedure is often used for back, neck, or arm pain.  Radiofrequency lesioning involves the use of a machine that creates radio waves to make heat.  Plan to have someone take you home from the hospital or clinic. Do not drive for 24 hours if you were given a sedative during your procedure.  Return to your normal activities as told by your health care provider. Ask your health care provider what activities are safe for you. This information is not intended to replace advice given to you by your health care provider. Make sure you discuss any questions you have with your health care provider. Document Revised: 09/22/2017 Document Reviewed: 09/22/2017 [employer] Patient Education  2021 [employer]  

## 2020-05-28 NOTE — Progress Notes (Signed)
Safety precautions to be maintained throughout the outpatient stay will include: orient to surroundings, keep bed in low position, maintain call bell within reach at all times, provide assistance with transfer out of bed and ambulation.  

## 2020-05-28 NOTE — Progress Notes (Signed)
PROVIDER NOTE: Information contained herein reflects review and annotations entered in association with encounter. Interpretation of such information and data should be left to medically-trained personnel. Information provided to patient can be located elsewhere in the medical record under "Patient Instructions". Document created using STT-dictation technology, any transcriptional errors that may result from process are unintentional.    Patient: Pamela Daniel  Service Category: Procedure  Provider: Gillis Santa, MD  DOB: 1950-11-07  DOS: 05/28/2020  Location: [facility]  MRN: [id number]  Setting: Ambulatory - outpatient  Referring Provider: Gillis Santa, MD  Type: Established Patient  Specialty: Interventional Pain Management  PCP: Velna Hatchet, MD   Primary Reason for Visit: Interventional Pain Management Treatment. CC: Back Pain  Procedure:          Anesthesia, Analgesia, Anxiolysis:  Type: Thermal Lumbar Facet, Medial Branch Radiofrequency Ablation/Neurotomy  #1  Primary Purpose: Therapeutic Region: Posterolateral Lumbosacral Spine Level:L3, L4, L5,  Medial Branch Level(s). These levels will denervate the L3-4, L4-5,lumbar facet joints. Laterality: Left  Type: Moderate (Conscious) Sedation combined with Local Anesthesia Indication(s): Analgesia and Anxiety Route: Intravenous (IV) IV Access: Secured Sedation: Meaningful verbal contact was maintained at all times during the procedure  Local Anesthetic: Lidocaine 1-2%  Position: Prone   Indications: 1. Lumbar facet arthropathy   2. Lumbar spondylosis   3. Chronic pain syndrome    Ms. Pamela Daniel has been dealing with the above chronic pain for longer than three months and has either failed to respond, was unable to tolerate, or simply did not get enough benefit from other more conservative therapies including, but not limited to: 1. Over-the-counter medications 2. Anti-inflammatory medications 3. Muscle  relaxants 4. Membrane stabilizers 5. Opioids 6. Physical therapy and/or chiropractic manipulation 7. Modalities (Heat, ice, etc.) 8. Invasive techniques such as nerve blocks. Ms. Pamela Daniel has attained more than 50% relief of the pain from a series of diagnostic injections conducted in separate occasions.  Pain Score: Pre-procedure: 4 /10 Post-procedure: 0-No pain/10   Pre-op H&P Assessment:  Ms. Pamela Daniel is a 70 y.o. (year old), female patient, seen today for interventional treatment. She  has a past surgical history that includes Cardiac electrophysiology mapping and ablation (01-21-2010   dr Caryl Comes); Foot neuroma surgery (Left, 1996); Nasal septum surgery (1991); TEE without cardioversion (02/15/2012); Vaginal hysterectomy (1996); LAPAROSCOPY TAKEDOWN AND REPAIR HIATAL HERNIA /  NISSEN FUNDOPLATION (09-01-2005    dr Hassell Done  [facility]); transthoracic echocardiogram (12-22-2011    dr Einar Gip); Hemorrhoid surgery (N/A, 10/28/2016); Breast excisional biopsy (Right); and LEFT HEART CATH AND CORONARY ANGIOGRAPHY (N/A, 01/17/2020). Ms. Pamela Daniel has a current medication list which includes the following prescription(s): acetaminophen, albuterol, alprazolam, aspirin, vitamin d3, ibuprofen, nitroglycerin, pantoprazole, rosuvastatin, spironolactone, and budesonide-formoterol, and the following Facility-Administered Medications: fentanyl and sodium chloride flush. Her primarily concern today is the Back Pain  Initial Vital Signs:  Pulse/HCG Rate: 71ECG Heart Rate: 68 Temp: (!) 97.1 F (36.2 C) Resp: 18 BP: (!) 135/54 SpO2: 97 %  BMI: Estimated body mass index is 34.58 kg/m as calculated from the following:   Height as of this encounter: 5\' 1"  (1.549 m).   Weight as of this encounter: 183 lb (83 kg).  Risk Assessment: Allergies: Reviewed. She has No Known Allergies.  Allergy Precautions: None required Coagulopathies: Reviewed. None identified.  Blood-thinner therapy: None at this time Active Infection(s):  Reviewed. None identified. Ms. Pamela Daniel is afebrile  Site Confirmation: Ms. Pamela Daniel was asked to confirm the procedure and laterality before marking the site Procedure checklist: Completed  Consent: Before the procedure and under the influence of no sedative(s), amnesic(s), or anxiolytics, the patient was informed of the treatment options, risks and possible complications. To fulfill our ethical and legal obligations, as recommended by the American Medical Association's Code of Ethics, I have informed the patient of my clinical impression; the nature and purpose of the treatment or procedure; the risks, benefits, and possible complications of the intervention; the alternatives, including doing nothing; the risk(s) and benefit(s) of the alternative treatment(s) or procedure(s); and the risk(s) and benefit(s) of doing nothing. The patient was provided information about the general risks and possible complications associated with the procedure. These may include, but are not limited to: failure to achieve desired goals, infection, bleeding, organ or nerve damage, allergic reactions, paralysis, and death. In addition, the patient was informed of those risks and complications associated to Spine-related procedures, such as failure to decrease pain; infection (i.e.: Meningitis, epidural or intraspinal abscess); bleeding (i.e.: epidural hematoma, subarachnoid hemorrhage, or any other type of intraspinal or peri-dural bleeding); organ or nerve damage (i.e.: Any type of peripheral nerve, nerve root, or spinal cord injury) with subsequent damage to sensory, motor, and/or autonomic systems, resulting in permanent pain, numbness, and/or weakness of one or several areas of the body; allergic reactions; (i.e.: anaphylactic reaction); and/or death. Furthermore, the patient was informed of those risks and complications associated with the medications. These include, but are not limited to: allergic reactions (i.e.:  anaphylactic or anaphylactoid reaction(s)); adrenal axis suppression; blood sugar elevation that in diabetics may result in ketoacidosis or comma; water retention that in patients with history of congestive heart failure may result in shortness of breath, pulmonary edema, and decompensation with resultant heart failure; weight gain; swelling or edema; medication-induced neural toxicity; particulate matter embolism and blood vessel occlusion with resultant organ, and/or nervous system infarction; and/or aseptic necrosis of one or more joints. Finally, the patient was informed that Medicine is not an exact science; therefore, there is also the possibility of unforeseen or unpredictable risks and/or possible complications that may result in a catastrophic outcome. The patient indicated having understood very clearly. We have given the patient no guarantees and we have made no promises. Enough time was given to the patient to ask questions, all of which were answered to the patient's satisfaction. Ms. Krzyzanowski has indicated that she wanted to continue with the procedure. Attestation: I, the ordering provider, attest that I have discussed with the patient the benefits, risks, side-effects, alternatives, likelihood of achieving goals, and potential problems during recovery for the procedure that I have provided informed consent. Date  Time: 05/28/2020 12:48 PM  Pre-Procedure Preparation:  Monitoring: As per clinic protocol. Respiration, ETCO2, SpO2, BP, heart rate and rhythm monitor placed and checked for adequate function Safety Precautions: Patient was assessed for positional comfort and pressure points before starting the procedure. Time-out: I initiated and conducted the "Time-out" before starting the procedure, as per protocol. The patient was asked to participate by confirming the accuracy of the "Time Out" information. Verification of the correct person, site, and procedure were performed and confirmed by  me, the nursing staff, and the patient. "Time-out" conducted as per Joint Commission's Universal Protocol (UP.01.01.01). Time: 1337  Description of Procedure:          Laterality: Left Levels:L3, L4, L5, Medial Branch Level(s), at the L3-4, L4-5, umbar facet joints. Area Prepped: Lumbosacral DuraPrep (Iodine Povacrylex [0.7% available iodine] and Isopropyl Alcohol, 74% w/w) Safety Precautions: Aspiration looking for blood return was conducted prior  to all injections. At no point did we inject any substances, as a needle was being advanced. Before injecting, the patient was told to immediately notify me if she was experiencing any new onset of "ringing in the ears, or metallic taste in the mouth". No attempts were made at seeking any paresthesias. Safe injection practices and needle disposal techniques used. Medications properly checked for expiration dates. SDV (single dose vial) medications used. After the completion of the procedure, all disposable equipment used was discarded in the proper designated medical waste containers. Local Anesthesia: Protocol guidelines were followed. The patient was positioned over the fluoroscopy table. The area was prepped in the usual manner. The time-out was completed. The target area was identified using fluoroscopy. A 12-in long, straight, sterile hemostat was used with fluoroscopic guidance to locate the targets for each level blocked. Once located, the skin was marked with an approved surgical skin marker. Once all sites were marked, the skin (epidermis, dermis, and hypodermis), as well as deeper tissues (fat, connective tissue and muscle) were infiltrated with a small amount of a short-acting local anesthetic, loaded on a 10cc syringe with a 25G, 1.5-in  Needle. An appropriate amount of time was allowed for local anesthetics to take effect before proceeding to the next step. Local Anesthetic: Lidocaine 2.0% The unused portion of the local anesthetic was discarded in  the proper designated containers. Technical explanation of process:  Radiofrequency Ablation (RFA) L3 Medial Branch Nerve RFA: The target area for the L3 medial branch is at the junction of the postero-lateral aspect of the superior articular process and the superior, posterior, and medial edge of the transverse process of L4. Under fluoroscopic guidance, a Radiofrequency needle was inserted until contact was made with os over the superior postero-lateral aspect of the pedicular shadow (target area). Sensory and motor testing was conducted to properly adjust the position of the needle. Once satisfactory placement of the needle was achieved, the numbing solution was slowly injected after negative aspiration for blood. 2.0 mL of the nerve block solution was injected without difficulty or complication. After waiting for at least 3 minutes, the ablation was performed. Once completed, the needle was removed intact. L4 Medial Branch Nerve RFA: The target area for the L4 medial branch is at the junction of the postero-lateral aspect of the superior articular process and the superior, posterior, and medial edge of the transverse process of L5. Under fluoroscopic guidance, a Radiofrequency needle was inserted until contact was made with os over the superior postero-lateral aspect of the pedicular shadow (target area). Sensory and motor testing was conducted to properly adjust the position of the needle. Once satisfactory placement of the needle was achieved, the numbing solution was slowly injected after negative aspiration for blood. 2.0 mL of the nerve block solution was injected without difficulty or complication. After waiting for at least 3 minutes, the ablation was performed. Once completed, the needle was removed intact. L5 Medial Branch Nerve RFA: The target area for the L5 medial branch is at the junction of the postero-lateral aspect of the superior articular process of S1 and the superior, posterior, and  medial edge of the sacral ala. Under fluoroscopic guidance, a Radiofrequency needle was inserted until contact was made with os over the superior postero-lateral aspect of the pedicular shadow (target area). Sensory and motor testing was conducted to properly adjust the position of the needle. Once satisfactory placement of the needle was achieved, the numbing solution was slowly injected after negative aspiration for blood. 2.0  mL of the nerve block solution was injected without difficulty or complication. After waiting for at least 3 minutes, the ablation was performed. Once completed, the needle was removed intact.  Radiofrequency lesioning (ablation):  Radiofrequency Generator: NeuroTherm [id number] Sensory Stimulation Parameters: 50 Hz was used to locate & identify the nerve, making sure that the needle was positioned such that there was no sensory stimulation below 0.3 V or above 0.7 V. Motor Stimulation Parameters: 2 Hz was used to evaluate the motor component. Care was taken not to lesion any nerves that demonstrated motor stimulation of the lower extremities at an output of less than 2.5 times that of the sensory threshold, or a maximum of 2.0 V. Lesioning Technique Parameters: Standard Radiofrequency settings. (Not bipolar or pulsed.) Temperature Settings: 80 degrees C Lesioning time: 60 seconds Intra-operative Compliance: Compliant Materials & Medications: Needle(s) (Electrode/Cannula) Type: Teflon-coated, curved tip, Radiofrequency needle(s) Gauge: 22G Length: 10cm Numbing solution: 6 cc solution made of 5 cc of 0.2% Ropivacaine and 1 cc of Decadron 10 mg/cc. 2 cc injected at each level above on the left prior to lesioning after sensorimotor testing.  Once the entire procedure was completed, the treated area was cleaned, making sure to leave some of the prepping solution back to take advantage of its long term bactericidal properties.    Illustration of the posterior view of the lumbar  spine and the posterior neural structures. Laminae of L2 through S1 are labeled. DPRL5, dorsal primary ramus of L5; DPRS1, dorsal primary ramus of S1; DPR3, dorsal primary ramus of L3; FJ, facet (zygapophyseal) joint L3-L4; I, inferior articular process of L4; LB1, lateral branch of dorsal primary ramus of L1; IAB, inferior articular branches from L3 medial branch (supplies L4-L5 facet joint); IBP, intermediate branch plexus; MB3, medial branch of dorsal primary ramus of L3; NR3, third lumbar nerve root; S, superior articular process of L5; SAB, superior articular branches from L4 (supplies L4-5 facet joint also); TP3, transverse process of L3.  Vitals:   05/28/20 1344 05/28/20 1349 05/28/20 1354 05/28/20 1358  BP: (!) 154/82 (!) 154/86 (!) 149/81 (!) 148/81  Pulse:      Resp: 14 13 10 13   Temp:      TempSrc:      SpO2: 99% 94% 95% 96%  Weight:      Height:       Start Time: 1337 hrs. End Time:   hrs.  Imaging Guidance (Spinal):          Type of Imaging Technique: Fluoroscopy Guidance (Spinal) Indication(s): Assistance in needle guidance and placement for procedures requiring needle placement in or near specific anatomical locations not easily accessible without such assistance. Exposure Time: Please see nurses notes. Contrast: None used. Fluoroscopic Guidance: I was personally present during the use of fluoroscopy. "Tunnel Vision Technique" used to obtain the best possible view of the target area. Parallax error corrected before commencing the procedure. "Direction-depth-direction" technique used to introduce the needle under continuous pulsed fluoroscopy. Once target was reached, antero-posterior, oblique, and lateral fluoroscopic projection used confirm needle placement in all planes. Images permanently stored in EMR. Interpretation: No contrast injected. I personally interpreted the imaging intraoperatively. Adequate needle placement confirmed in multiple planes. Permanent images saved into  the patient's record.  Post-operative Assessment:  Post-procedure Vital Signs:  Pulse/HCG Rate: 7165 Temp: (!) 97.1 F (36.2 C) Resp: 13 BP: (!) 148/81 SpO2: 96 %  EBL: None  Complications: No immediate post-treatment complications observed by team, or reported by patient.  Note: The  patient tolerated the entire procedure well. A repeat set of vitals were taken after the procedure and the patient was kept under observation following institutional policy, for this type of procedure. Post-procedural neurological assessment was performed, showing return to baseline, prior to discharge. The patient was provided with post-procedure discharge instructions, including a section on how to identify potential problems. Should any problems arise concerning this procedure, the patient was given instructions to immediately contact us, at any time, without hesitation. In any case, we plan to contact the patient by telephone for a follow-up status report regarding this interventional procedure.  Comments:  No additional relevant information.  Plan of Care  Orders:  Orders Placed This Encounter  Procedures  . [facility] PA[facility] C-ARM 1-60 MIN NO REPORT    Intraoperative interpretation by procedural physician at [place].    Standing Status:   Standing    Number of Occurrences:   1    Order Specific Question:   Reason for exam:    Answer:   Assistance in needle guidance and placement for procedures requiring needle placement in or near specific anatomical locations not easily accessible without such assistance.   Medications ordered for procedure: Meds ordered this encounter  Medications  . lidocaine (XYLOCAINE) 2 % (with pres) injection 400 mg  . fentaNYL (SUBLIMAZE) injection 25-50 mcg    Make sure Narcan is available in the pyxis when using this medication. In the event of respiratory depression (RR< 8/min): Titrate NARCAN (naloxone) in increments of 0.1 to 0.2 mg IV at 2-3 minute  intervals, until desired degree of reversal.  . ropivacaine (PF) 2 mg/mL (0.2%) (NAROPIN) injection 9 mL  . dexamethasone (DECADRON) injection 10 mg   Medications administered: We administered lidocaine, fentaNYL, ropivacaine (PF) 2 mg/mL (0.2%), and dexamethasone.  See the medical record for exact dosing, route, and time of administration.  Follow-up plan:   Return in about 6 weeks (around 07/09/2020) for Post Procedure Evaluation, virtual.    Recent Visits Date Type Provider Dept  05/07/20 Procedure visit Gillis Santa, MD [facility]  04/29/20 Office Visit Gillis Santa, MD [facility]  Showing recent visits within past 90 days and meeting all other requirements Today's Visits Date Type Provider Dept  05/28/20 Procedure visit Gillis Santa, MD [facility]  Showing today's visits and meeting all other requirements Future Appointments No visits were found meeting these conditions. Showing future appointments within next 90 days and meeting all other requirements  Disposition: Discharge home  Discharge (Date  Time): 05/28/2020;   hrs.   Primary Care Physician: Velna Hatchet, MD Location: [facility] Note by: Gillis Santa, MD Date: 05/28/2020; Time: 2:12 PM  Disclaimer:  Medicine is not an Chief Strategy Officer. The only guarantee in medicine is that nothing is guaranteed. It is important to note that the decision to proceed with this intervention was based on the information collected from the patient. The Data and conclusions were drawn from the patient's questionnaire, the interview, and the physical examination. Because the information was provided in large part by the patient, it cannot be guaranteed that it has not been purposely or unconsciously manipulated. Every effort has been made to obtain as much relevant data as possible for this evaluation. It is important to note that the conclusions that lead to this procedure are  derived in large part from the available data. Always take into account that the treatment will also be dependent on availability of resources and existing treatment  guidelines, considered by other Pain Management Practitioners as being common knowledge and practice, at the time of the intervention. For Medico-Legal purposes, it is also important to point out that variation in procedural techniques and pharmacological choices are the acceptable norm. The indications, contraindications, technique, and results of the above procedure should only be interpreted and judged by a Board-Certified Interventional Pain Specialist with extensive familiarity and expertise in the same exact procedure and technique.

## 2020-05-29 ENCOUNTER — Telehealth: Payer: Self-pay | Admitting: *Deleted

## 2020-05-29 NOTE — Telephone Encounter (Signed)
No problems post procedure. 

## 2020-06-14 DIAGNOSIS — G4733 Obstructive sleep apnea (adult) (pediatric): Secondary | ICD-10-CM | POA: Diagnosis not present

## 2020-07-08 ENCOUNTER — Encounter: Payer: Self-pay | Admitting: Student in an Organized Health Care Education/Training Program

## 2020-07-09 ENCOUNTER — Encounter: Payer: Self-pay | Admitting: Student in an Organized Health Care Education/Training Program

## 2020-07-09 ENCOUNTER — Ambulatory Visit
Payer: Medicare HMO | Attending: Student in an Organized Health Care Education/Training Program | Admitting: Student in an Organized Health Care Education/Training Program

## 2020-07-09 ENCOUNTER — Other Ambulatory Visit: Payer: Self-pay

## 2020-07-09 DIAGNOSIS — M47816 Spondylosis without myelopathy or radiculopathy, lumbar region: Secondary | ICD-10-CM | POA: Insufficient documentation

## 2020-07-09 DIAGNOSIS — G894 Chronic pain syndrome: Secondary | ICD-10-CM | POA: Insufficient documentation

## 2020-07-09 NOTE — Progress Notes (Signed)
Patient: Pamela Daniel  Service Category: E/M  Provider: Gillis Santa, MD  DOB: Dec 06, 1950  DOS: 07/09/2020  Location: Office  MRN: [id number]  Setting: Ambulatory outpatient  Referring Provider: Velna Hatchet, MD  Type: Established Patient  Specialty: Interventional Pain Management  PCP: Velna Hatchet, MD  Location: Remote location  Delivery: TeleHealth     Virtual Encounter - Pain Management PROVIDER NOTE: Information contained herein reflects review and annotations entered in association with encounter. Interpretation of such information and data should be left to medically-trained personnel. Information provided to patient can be located elsewhere in the medical record under "Patient Instructions". Document created using STT-dictation technology, any transcriptional errors that may result from process are unintentional.    Contact & Pharmacy Preferred: [phone] Home: [phone] (home) Mobile: [phone] (mobile) E-mail: [email or web address], [place] - [place] [address] Phone: [phone] Fax: [phone]   Pre-screening  [name] offered "in-person" vs "virtual" encounter. She indicated preferring virtual for this encounter.   Reason COVID-19*  Social distancing based on CDC and AMA recommendations.   I contacted DARINDA STUTEVILLE on 07/09/2020 via video conference.      I clearly identified myself as Gillis Santa, MD. I verified that I was speaking with the correct person using two identifiers (Name: Pamela Daniel, and date of birth: [birth year]).  Consent I sought verbal advanced consent from [name] for virtual visit interactions. I informed [name] of possible security and privacy concerns, risks, and limitations associated with providing "not-in-person" medical evaluation and management services. I also informed [name] of the availability of "in-person" appointments. Finally, I  informed her that there would be a charge for the virtual visit and that she could be  personally, fully or partially, financially responsible for it. Ms. Ausburn expressed understanding and agreed to proceed.   Historic Elements   Pamela Daniel is a 70 y.o. year old, female patient evaluated today after our last contact on 05/28/2020. [name]  has a past medical history of Anxiety, Arthritis, Asthma, Blood transfusion without reported diagnosis, Fatty liver, Fatty pancreas, First degree heart block, Frequency of urination, GERD (gastroesophageal reflux disease), Hemorrhoids, Hepatic steatosis, Hiatal hernia, History of acute pancreatitis (03/16/2015), History of adenomatous polyp of colon, History of concussion (2012), History of transient ischemic attack (TIA) (12/22/2011), History of vaginal dysplasia (2016), Hypertension, Pancreatic cyst, Pseudocyst of pancreas, PSVT (paroxysmal supraventricular tachycardia) ([place]), Rectal bleeding, S/P AV (atrioventricular) nodal ablation (01-21-2010    dr Caryl Comes), Simple renal cyst, Urgency of urination, Urinary frequency, and Urinary leakage. She also  has a past surgical history that includes Cardiac electrophysiology mapping and ablation (01-21-2010   dr Caryl Comes); Foot neuroma surgery (Left, 1996); Nasal septum surgery (1991); TEE without cardioversion (02/15/2012); Vaginal hysterectomy (1996); LAPAROSCOPY TAKEDOWN AND REPAIR HIATAL HERNIA /  NISSEN FUNDOPLATION (09-01-2005    dr Hassell Done  [facility]); transthoracic echocardiogram (12-22-2011    dr Einar Gip); Hemorrhoid surgery (N/A, 10/28/2016); Breast excisional biopsy (Right); and LEFT HEART CATH AND CORONARY ANGIOGRAPHY (N/A, 01/17/2020). [name] has a current medication list which includes the following prescription(s): acetaminophen, albuterol, alprazolam, aspirin, vitamin d3, ibuprofen, nitroglycerin, pantoprazole, rosuvastatin, spironolactone, and budesonide-formoterol, and the following Facility-Administered  Medications: sodium chloride flush. She  reports that she has never smoked. She has never used smokeless tobacco. She reports that she does not drink alcohol and does not use drugs. [name] has No Known Allergies.   HPI  Today, she is being  contacted for a post-procedure assessment.   Post-Procedure Evaluation  Procedure (05/28/2020):   Type: Thermal Lumbar Facet, Medial Branch Radiofrequency Ablation/Neurotomy  #1  Primary Purpose: Therapeutic Region: Posterolateral Lumbosacral Spine Level:L3, L4, L5,  Medial Branch Level(s). These levels will denervate the L3-4, L4-5,lumbar facet joints. Laterality: Left  Sedation: Please see nurses note.  Effectiveness during initial hour after procedure(Ultra-Short Term Relief): 100 %   Local anesthetic used: Long-acting (4-6 hours) Effectiveness: Defined as any analgesic benefit obtained secondary to the administration of local anesthetics. This carries significant diagnostic value as to the etiological location, or anatomical origin, of the pain. Duration of benefit is expected to coincide with the duration of the local anesthetic used.  Effectiveness during initial 4-6 hours after procedure(Short-Term Relief): 100 %   Long-term benefit: Defined as any relief past the pharmacologic duration of the local anesthetics.  Effectiveness past the initial 6 hours after procedure(Long-Term Relief): 50 %   Current benefits: Defined as benefit that persist at this time.   Analgesia:  50% improved Function: [name] reports improvement in function ROM: [name] reports improvement in ROM    Laboratory Chemistry Profile   Renal Lab Results  Component Value Date   BUN 12 04/11/2019   CREATININE 0.78 04/11/2019   GFRAA >60 04/11/2019   GFRNONAA >60 04/11/2019     Hepatic Lab Results  Component Value Date   AST 16 04/21/2018   ALT 12 04/21/2018   ALBUMIN 3.6 04/21/2018   ALKPHOS 64 04/21/2018   HCVAB NEGATIVE 03/15/2014   LIPASE 27  04/21/2018     Electrolytes Lab Results  Component Value Date   NA 140 04/11/2019   K 4.0 04/11/2019   CL 106 04/11/2019   CALCIUM 9.0 04/11/2019   MG 1.9 04/21/2018     Bone No results found for: VD25OH, VD125OH2TOT, EZ6629UT6, LY6503TW6, 25OHVITD1, 25OHVITD2, 25OHVITD3, TESTOFREE, TESTOSTERONE   Inflammation (CRP: Acute Phase) (ESR: Chronic Phase) Lab Results  Component Value Date   ESRSEDRATE 10 08/01/2007   LATICACIDVEN 2.2 ([place]) 10/09/2017       Note: Above Lab results reviewed.    Assessment  The primary encounter diagnosis was Lumbar facet arthropathy. Diagnoses of Lumbar spondylosis and Chronic pain syndrome were also pertinent to this visit.  Plan of Care   Ms. YUNIQUE DEARCOS has a current medication list which includes the following long-term medication(s): albuterol, nitroglycerin, rosuvastatin, and spironolactone.   Postprocedural evaluation after left L3, L4, L5 facet medial branch nerve block 1 on 05/28/2020.  Patient states that she experienced 100% pain relief for the first 2 days which gradually decreased to 50% pain relief at 2 weeks.  She states that she was able to ambulate and less pain and had better range of motion along her left side for the first week.  We discussed repeating left L3, L4, L5 facet medial branch nerve block #2 and then considering lumbar radiofrequency ablation for the purpose of obtaining longer-term pain relief.  Risks and benefits reviewed and patient like to proceed.  Orders:  Orders Placed This Encounter  Procedures   LUMBAR FACET(MEDIAL BRANCH NERVE BLOCK) MBNB    Standing Status:   Future    Standing Expiration Date:   08/08/2020    Scheduling Instructions:     Procedure: Lumbar facet block (AKA.: Lumbosacral medial branch nerve block)     Side: LEFT     Level: L3-4, L4-5, Facets (L3, L4, L5, Medial Branch Nerves)     Sedation: with  Timeframe: ASAA    Order Specific Question:   Where will this procedure be performed?     Answer:   [facility]    Follow-up plan:   Return in about 1 week (around 07/16/2020) for Left L3, 4, 5 Fct Block #2, with sedation.    Recent Visits Date Type Provider Dept  05/28/20 Procedure visit Gillis Santa, MD [facility]  05/07/20 Procedure visit Gillis Santa, MD [facility]  04/29/20 Office Visit Gillis Santa, MD [facility]  Showing recent visits within past 90 days and meeting all other requirements Today's Visits Date Type Provider Dept  07/09/20 Telemedicine Gillis Santa, MD [facility]  Showing today's visits and meeting all other requirements Future Appointments No visits were found meeting these conditions. Showing future appointments within next 90 days and meeting all other requirements I discussed the assessment and treatment plan with the patient. The patient was provided an opportunity to ask questions and all were answered. The patient agreed with the plan and demonstrated an understanding of the instructions.  Patient advised to call back or seek an in-person evaluation if the symptoms or condition worsens.  Duration of encounter: 66mnutes.  Note by: BGillis Santa MD Date: 07/09/2020; Time: 11:28 AM

## 2020-07-11 DIAGNOSIS — R300 Dysuria: Secondary | ICD-10-CM | POA: Diagnosis not present

## 2020-07-15 ENCOUNTER — Telehealth: Payer: Self-pay

## 2020-07-15 DIAGNOSIS — G4733 Obstructive sleep apnea (adult) (pediatric): Secondary | ICD-10-CM | POA: Diagnosis not present

## 2020-07-15 NOTE — Telephone Encounter (Signed)
[employer] denied auth for facets because she just had an RFA on 05/28/20. They states its a contraindication to order facets after RFA last month.

## 2020-07-29 ENCOUNTER — Emergency Department
Admission: EM | Admit: 2020-07-29 | Discharge: 2020-07-29 | Disposition: A | Discharge status: Home / Self Care | Payer: Medicare HMO | Attending: Emergency Medicine | Admitting: Emergency Medicine

## 2020-07-29 ENCOUNTER — Encounter: Payer: Self-pay | Admitting: Medical Oncology

## 2020-07-29 ENCOUNTER — Emergency Department: Payer: Medicare HMO

## 2020-07-29 DIAGNOSIS — M79605 Pain in left leg: Secondary | ICD-10-CM | POA: Diagnosis not present

## 2020-07-29 DIAGNOSIS — R531 Weakness: Secondary | ICD-10-CM | POA: Diagnosis not present

## 2020-07-29 DIAGNOSIS — I10 Essential (primary) hypertension: Secondary | ICD-10-CM | POA: Insufficient documentation

## 2020-07-29 DIAGNOSIS — R5383 Other fatigue: Secondary | ICD-10-CM | POA: Diagnosis not present

## 2020-07-29 DIAGNOSIS — M79604 Pain in right leg: Secondary | ICD-10-CM | POA: Insufficient documentation

## 2020-07-29 DIAGNOSIS — R0602 Shortness of breath: Secondary | ICD-10-CM | POA: Insufficient documentation

## 2020-07-29 DIAGNOSIS — J45909 Unspecified asthma, uncomplicated: Secondary | ICD-10-CM | POA: Insufficient documentation

## 2020-07-29 DIAGNOSIS — Z20822 Contact with and (suspected) exposure to covid-19: Secondary | ICD-10-CM | POA: Insufficient documentation

## 2020-07-29 DIAGNOSIS — Z7982 Long term (current) use of aspirin: Secondary | ICD-10-CM | POA: Diagnosis not present

## 2020-07-29 LAB — CK: Total CK: 29 U/L — ABNORMAL LOW (ref 38–234)

## 2020-07-29 LAB — CBC WITH DIFFERENTIAL/PLATELET
Abs Immature Granulocytes: 0 10*3/uL (ref 0.00–0.07)
Basophils Absolute: 0 10*3/uL (ref 0.0–0.1)
Basophils Relative: 1 %
Eosinophils Absolute: 0.1 10*3/uL (ref 0.0–0.5)
Eosinophils Relative: 4 %
HCT: 44.5 % (ref 36.0–46.0)
Hemoglobin: 15.2 g/dL — ABNORMAL HIGH (ref 12.0–15.0)
Immature Granulocytes: 0 %
Lymphocytes Relative: 34 %
Lymphs Abs: 1.1 10*3/uL (ref 0.7–4.0)
MCH: 31.5 pg (ref 26.0–34.0)
MCHC: 34.2 g/dL (ref 30.0–36.0)
MCV: 92.1 fL (ref 80.0–100.0)
Monocytes Absolute: 0.3 10*3/uL (ref 0.1–1.0)
Monocytes Relative: 9 %
Neutro Abs: 1.7 10*3/uL (ref 1.7–7.7)
Neutrophils Relative %: 52 %
Platelets: 142 10*3/uL — ABNORMAL LOW (ref 150–400)
RBC: 4.83 MIL/uL (ref 3.87–5.11)
RDW: 12.8 % (ref 11.5–15.5)
Smear Review: NORMAL
WBC: 3.2 10*3/uL — ABNORMAL LOW (ref 4.0–10.5)
nRBC: 0 % (ref 0.0–0.2)

## 2020-07-29 LAB — URINALYSIS, COMPLETE (UACMP) WITH MICROSCOPIC
Bacteria, UA: NONE SEEN
Bilirubin Urine: NEGATIVE
Glucose, UA: NEGATIVE mg/dL
Hgb urine dipstick: NEGATIVE
Ketones, ur: NEGATIVE mg/dL
Leukocytes,Ua: NEGATIVE
Nitrite: NEGATIVE
Protein, ur: NEGATIVE mg/dL
Specific Gravity, Urine: 1.019 (ref 1.005–1.030)
Squamous Epithelial / HPF: NONE SEEN (ref 0–5)
pH: 6 (ref 5.0–8.0)

## 2020-07-29 LAB — COMPREHENSIVE METABOLIC PANEL
ALT: 97 U/L — ABNORMAL HIGH (ref 0–44)
AST: 126 U/L — ABNORMAL HIGH (ref 15–41)
Albumin: 3.6 g/dL (ref 3.5–5.0)
Alkaline Phosphatase: 68 U/L (ref 38–126)
Anion gap: 7 (ref 5–15)
BUN: 11 mg/dL (ref 8–23)
CO2: 25 mmol/L (ref 22–32)
Calcium: 8.8 mg/dL — ABNORMAL LOW (ref 8.9–10.3)
Chloride: 104 mmol/L (ref 98–111)
Creatinine, Ser: 0.71 mg/dL (ref 0.44–1.00)
GFR, Estimated: >60 mL/min (ref >60)
Glucose, Bld: 165 mg/dL — ABNORMAL HIGH (ref 70–99)
Potassium: 3.8 mmol/L (ref 3.5–5.1)
Sodium: 136 mmol/L (ref 135–145)
Total Bilirubin: 0.7 mg/dL (ref 0.3–1.2)
Total Protein: 6.8 g/dL (ref 6.5–8.1)

## 2020-07-29 LAB — TROPONIN I (HIGH SENSITIVITY)
Troponin I (High Sensitivity): 7 ng/L (ref <18)
Troponin I (High Sensitivity): 7 ng/L (ref <18)

## 2020-07-29 LAB — RESP PANEL BY RT-PCR (FLU A&B, COVID) ARPGX2
Influenza A by PCR: NEGATIVE
Influenza B by PCR: NEGATIVE
SARS Coronavirus 2 by RT PCR: NEGATIVE

## 2020-07-29 LAB — LACTIC ACID, PLASMA: Lactic Acid, Venous: 1.3 mmol/L (ref 0.5–1.9)

## 2020-07-29 LAB — BRAIN NATRIURETIC PEPTIDE: B Natriuretic Peptide: 56.4 pg/mL (ref 0.0–100.0)

## 2020-07-29 LAB — D-DIMER, QUANTITATIVE: D-Dimer, Quant: 7.3 ug/mL-FEU — ABNORMAL HIGH (ref 0.00–0.50)

## 2020-07-29 LAB — LIPASE, BLOOD: Lipase: 24 U/L (ref 11–51)

## 2020-07-29 MED ORDER — SODIUM CHLORIDE 0.9 % IV BOLUS
500.0000 mL | Freq: Once | INTRAVENOUS | Status: AC
  Administered 2020-07-29: 500 mL via INTRAVENOUS

## 2020-07-29 MED ORDER — PREDNISONE 50 MG PO TABS: ORAL_TABLET | ORAL | 0 refills | Status: DC

## 2020-07-29 MED ORDER — IOHEXOL 350 MG/ML SOLN
100.0000 mL | Freq: Once | INTRAVENOUS | Status: AC | PRN
  Administered 2020-07-29: 100 mL via INTRAVENOUS
  Filled 2020-07-29: qty 100

## 2020-07-29 NOTE — Discharge Instructions (Addendum)
Take one tablet once daily for five days.

## 2020-07-29 NOTE — ED Provider Notes (Signed)
[facility]-EMERGENCY DEPARTMENT  ____________________________________________  Time seen: Approximately 4:45 PM  I have reviewed the triage vital signs and the nursing notes.   HISTORY  Chief Complaint Shortness of Breath (/)   Historian Patient     HPI Pamela Daniel is a 70 y.o. female with a history of anxiety, asthma, pancreatitis, GERD, presents to the emergency department with weakness, fatigue, bilateral leg pain and progressive shortness of breath.  Patient reports that she normally has shortness of breath at baseline stating that she has a history of asthma.  Patient reports that her shortness of breath has been worse over the past 2 to 3 days with exertion.  She denies chest tightness or chest pain.  No pain in the upper back.  She denies cough, fever, chills or other viral URI-like symptoms.  Patient reports that symptoms she has been sleeping way more than usual.  She does state that she has been around a family member who recently returned from [place] and has potential sick contacts.  She denies daily smoking, prolonged immobilization or recent surgery.  No prior history of DVT or PE.   Past Medical History:  Diagnosis Date   Anxiety    Arthritis    SPINE   Asthma    Blood transfusion without reported diagnosis    Fatty liver    Fatty pancreas    First degree heart block    Frequency of urination    GERD (gastroesophageal reflux disease)    Hemorrhoids    Hepatic steatosis    Hiatal hernia    POST RESIDUAL SMALL HH PER IMAGING   History of acute pancreatitis 03/16/2015   History of adenomatous polyp of colon    tubular adenoma's 04-08-2014/   hyperplastic bening polyp 2000   History of concussion 2012   no residual   History of transient ischemic attack (TIA) 12/22/2011   no residual   History of vaginal dysplasia 2016   Hypertension    Pancreatic cyst    Pseudocyst of pancreas    PSVT (paroxysmal supraventricular tachycardia) ([place])    cardioloigst-  dr  Einar Gip--- last visit 2013 per pt and is currently followed by pcp   Rectal bleeding    S/P AV (atrioventricular) nodal ablation 01-21-2010    dr Caryl Comes   Simple renal cyst    bilateral per imaging   Urgency of urination    Urinary frequency    Urinary leakage      Immunizations up to date:  Yes.     Past Medical History:  Diagnosis Date   Anxiety    Arthritis    SPINE   Asthma    Blood transfusion without reported diagnosis    Fatty liver    Fatty pancreas    First degree heart block    Frequency of urination    GERD (gastroesophageal reflux disease)    Hemorrhoids    Hepatic steatosis    Hiatal hernia    POST RESIDUAL SMALL HH PER IMAGING   History of acute pancreatitis 03/16/2015   History of adenomatous polyp of colon    tubular adenoma's 04-08-2014/   hyperplastic bening polyp 2000   History of concussion 2012   no residual   History of transient ischemic attack (TIA) 12/22/2011   no residual   History of vaginal dysplasia 2016   Hypertension    Pancreatic cyst    Pseudocyst of pancreas    PSVT (paroxysmal supraventricular tachycardia) ([place])    cardioloigst-  dr Einar Gip--- last  visit 2013 per pt and is currently followed by pcp   Rectal bleeding    S/P AV (atrioventricular) nodal ablation 01-21-2010    dr Caryl Comes   Simple renal cyst    bilateral per imaging   Urgency of urination    Urinary frequency    Urinary leakage     Patient Active Problem List   Diagnosis Date Noted   Lumbar facet arthropathy 04/29/2020   Lumbar degenerative disc disease 04/29/2020   Chronic pain syndrome 04/29/2020   Chest pain 01/16/2020   Unstable angina ([place]) 01/16/2020   Acute pancreatitis    Cough    Pancreatitis, acute    Epigastric abdominal pain    Pancreatitis 03/16/2015   VAIN I (vaginal intraepithelial neoplasia grade I) 04/17/2014   TIA (transient ischemic attack) 12/22/2011   SVT/ PSVT/ PAT 01/13/2010   RECTAL BLEEDING 08/01/2007   DIARRHEA 08/01/2007    ESOPHAGEAL STRICTURE 07/31/2007   HIATAL HERNIA 07/31/2007   COLONIC POLYPS, HX OF 07/31/2007   HYPERTENSION 03/14/2007   G E R D 03/14/2007    Past Surgical History:  Procedure Laterality Date   BREAST EXCISIONAL BIOPSY Right    CARDIAC ELECTROPHYSIOLOGY MAPPING AND ABLATION  01-21-2010   dr Caryl Comes   ablation AVNRT   FOOT NEUROMA SURGERY Left 1996   HEMORRHOID SURGERY N/A 10/28/2016   Procedure: HEMORRHOIDECTOMY AND HEMORRHOIDAL PEXY;  Surgeon: Leighton Ruff, MD;  Location: [place];  Service: General;  Laterality: N/A;   LAPAROSCOPY TAKEDOWN AND REPAIR HIATAL HERNIA /  NISSEN FUNDOPLATION  09-01-2005    dr Hassell Done  [facility]   LEFT HEART CATH AND CORONARY ANGIOGRAPHY N/A 01/17/2020   Procedure: LEFT HEART CATH AND CORONARY ANGIOGRAPHY;  Surgeon: Dionisio David, MD;  Location: [place] [facility];  Service: Cardiovascular;  Laterality: N/A;   NASAL SEPTUM SURGERY  1991   TEE WITHOUT CARDIOVERSION  02/15/2012   Procedure: TRANSESOPHAGEAL ECHOCARDIOGRAM (TEE);  Surgeon: Laverda Page, MD;  Location: [place];  Service: Cardiovascular;  Laterality: N/A;  normal LV, normal EF, mild MR, trace TR, trace PI   TRANSTHORACIC ECHOCARDIOGRAM  12-22-2011    dr Einar Gip   normal echo   [place]   w/  BSO    Prior to Admission medications   Medication Sig Start Date End Date Taking? Authorizing Provider  predniSONE (DELTASONE) 50 MG tablet Take one tablet once daily for five days. 07/29/20  Yes Vallarie Mare M, PA-C  acetaminophen (TYLENOL) 325 MG tablet Take 650 mg by mouth every 6 (six) hours as needed for mild pain.    [provider]  albuterol (PROVENTIL) (2.5 MG/3ML) 0.083% nebulizer solution Take 3 mLs (2.5 mg total) by nebulization every 6 (six) hours as needed for wheezing or shortness of breath. 02/28/18   Tasia Catchings, Amy V, PA-C  ALPRAZolam Duanne Moron) 0.25 MG tablet Take by mouth daily as needed for anxiety. .5mg     [provider]  aspirin 325  MG tablet Take 325 mg by mouth daily.    [provider]  budesonide-formoterol (SYMBICORT) 160-4.5 MCG/ACT inhaler Inhale 2 puffs into the lungs daily as needed (Shortness of breath). Patient not taking: No sig reported    [provider]  Cholecalciferol (VITAMIN D3) 1.25 MG (50000 UT) CAPS 1 capsule 02/05/20   [provider]  ibuprofen (ADVIL,MOTRIN) 200 MG tablet Take 800 mg by mouth every 8 (eight) hours as needed for mild pain.    [provider]  nitroGLYCERIN (NITROSTAT) 0.4  MG SL tablet Place 0.4 mg under the tongue every 5 (five) minutes as needed for chest pain.    [provider]  pantoprazole (PROTONIX) 40 MG tablet  04/02/20   [provider]  rosuvastatin (CRESTOR) 20 MG tablet Take 20 mg by mouth at bedtime. 01/16/20   [provider]  spironolactone (ALDACTONE) 25 MG tablet Take 25 mg by mouth daily. 11/24/19   [provider]    Allergies Patient has no known allergies.  Family History  Problem Relation Age of Onset   Heart disease Mother 63       Heart failure >> death at age 63   Dementia Mother    Hypertension Mother    Breast cancer Mother 40   Colon cancer Mother    Heart disease Father 83       MI   COPD Father    Heart disease Brother    Mental illness Brother    Lung cancer Brother 16   Bone cancer Brother    Lung cancer Brother    Bone cancer Brother    Colon cancer Brother    Breast cancer Maternal Aunt    Breast cancer Cousin    Esophageal cancer Neg Hx    Stomach cancer Neg Hx    Rectal cancer Neg Hx     Social History Social History   Tobacco Use   Smoking status: Never   Smokeless tobacco: Never  Vaping Use   Vaping Use: Never used  Substance Use Topics   Alcohol use: No   Drug use: No     Review of Systems  Constitutional: No fever/chills Eyes:  No discharge ENT: No upper respiratory complaints. Respiratory: Patient has SOB.  Gastrointestinal:   No nausea,  no vomiting.  No diarrhea.  No constipation. Musculoskeletal: Negative for musculoskeletal pain. Skin: Negative for rash, abrasions, lacerations, ecchymosis.   ____________________________________________   PHYSICAL EXAM:  VITAL SIGNS: ED Triage Vitals [07/29/20 1547]  Enc Vitals Group     BP (!) 167/89     Pulse Rate (!) 101     Resp 20     Temp 99.8 F (37.7 C)     Temp Source Oral     SpO2 96 %     Weight 182 lb (82.6 kg)     Height 5\' 1"  (1.549 m)     Head Circumference      Peak Flow      Pain Score 3     Pain Loc      Pain Edu?      Excl. in [place]?      Constitutional: Alert and oriented. Well appearing and in no acute distress. Eyes: Conjunctivae are normal. PERRL. EOMI. Head: Atraumatic. ENT:      Nose: No congestion/rhinnorhea.      Mouth/Throat: Mucous membranes are moist.  Neck: No stridor.  No cervical spine tenderness to palpation. Cardiovascular: Normal rate, regular rhythm. Normal S1 and S2.  Good peripheral circulation. Respiratory: Normal respiratory effort without tachypnea or retractions. Lungs CTAB. Good air entry to the bases with no decreased or absent breath sounds Gastrointestinal: Bowel sounds x 4 quadrants. Soft and nontender to palpation. No guarding or rigidity. No distention. Musculoskeletal: Full range of motion to all extremities. No obvious deformities noted Neurologic:  Normal for age. No gross focal neurologic deficits are appreciated.  Skin:  Skin is warm, dry and intact. No rash noted. Psychiatric: Mood and affect are normal for age. Speech and behavior are  normal.   ____________________________________________   LABS (all labs ordered are listed, but only abnormal results are displayed)  Labs Reviewed  CBC WITH DIFFERENTIAL/PLATELET - Abnormal; Notable for the following components:      Result Value   WBC 3.2 (*)    Hemoglobin 15.2 (*)    Platelets 142 (*)    All other components within normal limits  COMPREHENSIVE METABOLIC  PANEL - Abnormal; Notable for the following components:   Glucose, Bld 165 (*)    Calcium 8.8 (*)    AST 126 (*)    ALT 97 (*)    All other components within normal limits  CK - Abnormal; Notable for the following components:   Total CK 29 (*)    All other components within normal limits  D-DIMER, QUANTITATIVE - Abnormal; Notable for the following components:   D-Dimer, Quant 7.30 (*)    All other components within normal limits  URINALYSIS, COMPLETE (UACMP) WITH MICROSCOPIC - Abnormal; Notable for the following components:   Color, Urine YELLOW (*)    APPearance CLEAR (*)    All other components within normal limits  RESP PANEL BY RT-PCR (FLU A&B, COVID) ARPGX2  CULTURE, BLOOD (ROUTINE X 2)  CULTURE, BLOOD (ROUTINE X 2)  LIPASE, BLOOD  BRAIN NATRIURETIC PEPTIDE  LACTIC ACID, PLASMA  HEPATITIS PANEL, ACUTE  TROPONIN I (HIGH SENSITIVITY)  TROPONIN I (HIGH SENSITIVITY)   ____________________________________________  EKG   ____________________________________________  RADIOLOGY Unk Pinto, personally viewed and evaluated these images (plain radiographs) as part of my medical decision making, as well as reviewing the written report by the radiologist.    DG Chest 2 View  Result Date: 07/29/2020 CLINICAL DATA:  Shortness of breath and fatigue for approximately 3 weeks. EXAM: CHEST - 2 VIEW COMPARISON:  PA and lateral chest 04/11/2019. FINDINGS: The lungs are clear. Heart size is normal. No pneumothorax or pleural fluid. No acute or focal bony abnormality. IMPRESSION: Negative chest. Electronically Signed   By: Inge Rise M.D.   On: 07/29/2020 16:52   CT Angio Chest PE W and/or Wo Contrast  Result Date: 07/29/2020 CLINICAL DATA:  Shortness of breath and fatigue x3 weeks EXAM: CT ANGIOGRAPHY CHEST WITH CONTRAST TECHNIQUE: Multidetector CT imaging of the chest was performed using the standard protocol during bolus administration of intravenous contrast. Multiplanar CT  image reconstructions and MIPs were obtained to evaluate the vascular anatomy. CONTRAST:  160mL OMNIPAQUE IOHEXOL 350 MG/ML SOLN COMPARISON:  October 09, 2017 FINDINGS: Cardiovascular: Satisfactory opacification of the pulmonary arteries to the segmental level. No evidence of pulmonary embolism. Normal caliber central pulmonary vessels. Aortic atherosclerosis without aneurysmal dilation. Normal heart size. No significant pericardial effusion/thickening. No suspicious intracardiac filling defect. Mediastinum/Nodes: No discrete thyroid nodule. No pathologically enlarged mediastinal, hilar or axillary lymph nodes. The trachea and esophagus appear unremarkable. Lungs/Pleura: No suspicious pulmonary nodules or masses. No airspace consolidation. No pleural effusion. No pneumothorax. Upper Abdomen: Hepatic steatosis. Postsurgical changes at the GE junction. Musculoskeletal: Mild thoracic spondylosis. No acute osseous abnormality. Review of the MIP images confirms the above findings. IMPRESSION: 1. No evidence of pulmonary embolism or other acute cardiopulmonary process. 2. Hepatic steatosis. 3.  Aortic Atherosclerosis (ICD10-I70.0). Electronically Signed   By: Dahlia Bailiff MD   On: 07/29/2020 19:27    ____________________________________________    PROCEDURES  Procedure(s) performed:     Procedures     Medications  sodium chloride 0.9 % bolus 500 mL (0 mLs Intravenous Stopped 07/29/20 1901)  iohexol (OMNIPAQUE) 350  MG/ML injection 100 mL (100 mLs Intravenous Contrast Given 07/29/20 1804)     ____________________________________________   INITIAL IMPRESSION / ASSESSMENT AND PLAN / ED COURSE  Pertinent labs & imaging results that were available during my care of the patient were reviewed by me and considered in my medical decision making (see chart for details).      Assessment and Plan:  Weakness Shortness of breath: 70 year old female presents to the emergency department with shortness  of breath, malaise and myalgias for the past 3 weeks.  Patient was hypertensive and tachycardic at triage but vital signs were otherwise reassuring.  She was satting at 96 percent on room air with no increased work of breathing.  CBC indicated leukopenia, 3.2 and mild thrombocytopenia, 142.  AST and ALT were elevated at 126 and 97 respectively.  CK was within reference range.  D-dimer was elevated at 7.3.  Both sets of troponin were within reference range.  Lactic was within reference range.  BNP was within reference range.  Patient tested negative for COVID-19 and influenza.  CTA showed no evidence of PE.  No signs of pneumonia or pneumothorax on chest x-ray.  Patient reported that her fatigue and myalgias had improved some with supplemental fluids in the emergency department and patient reports that she has not been drinking very much at home.  I sent off a hepatitis panel given elevated AST and ALT.  Patient denied and right upper quadrant abdominal pain or back pain.  Do not feel that additional imaging is warranted at this time.  Recommended hydration at home.  Question whether patient shortness of breath and malaise may be secondary to early asthma exacerbation.  We will treat patient with prednisone burst for 5 days to assess for symptomatic improvement.  Patient was cautioned to return to the emergency department for reevaluation if symptoms seem to be worsening.  Also recommended follow-up with PCP.    ____________________________________________  FINAL CLINICAL IMPRESSION(S) / ED DIAGNOSES  Final diagnoses:  Shortness of breath      NEW MEDICATIONS STARTED DURING THIS VISIT:  ED Discharge Orders          Ordered    predniSONE (DELTASONE) 50 MG tablet        07/29/20 1940                This chart was dictated using voice recognition software/Dragon. Despite best efforts to proofread, errors can occur which can change the meaning. Any change was purely  unintentional.     Lannie Fields, PA-C 07/29/20 1948    Lucrezia Starch, MD 07/31/20 [phone]

## 2020-07-29 NOTE — ED Notes (Signed)
Patient transported to X-ray 

## 2020-07-29 NOTE — ED Triage Notes (Signed)
Pt reports that for the past 3 weeks she has been having sob and feeling fatigued/sleepy a lot. Pt reports pain that starts in her feet and radiates up into her hips.

## 2020-08-03 LAB — CULTURE, BLOOD (ROUTINE X 2)
Culture: NO GROWTH
Culture: NO GROWTH

## 2020-08-04 DIAGNOSIS — R7989 Other specified abnormal findings of blood chemistry: Secondary | ICD-10-CM | POA: Diagnosis not present

## 2020-08-04 DIAGNOSIS — R5383 Other fatigue: Secondary | ICD-10-CM | POA: Diagnosis not present

## 2020-08-04 DIAGNOSIS — R0602 Shortness of breath: Secondary | ICD-10-CM | POA: Diagnosis not present

## 2020-08-04 DIAGNOSIS — J45901 Unspecified asthma with (acute) exacerbation: Secondary | ICD-10-CM | POA: Diagnosis not present

## 2020-08-04 DIAGNOSIS — I471 Supraventricular tachycardia: Secondary | ICD-10-CM | POA: Diagnosis not present

## 2020-08-04 DIAGNOSIS — I10 Essential (primary) hypertension: Secondary | ICD-10-CM | POA: Diagnosis not present

## 2020-08-04 DIAGNOSIS — D61818 Other pancytopenia: Secondary | ICD-10-CM | POA: Diagnosis not present

## 2020-08-05 ENCOUNTER — Other Ambulatory Visit: Payer: Self-pay | Admitting: Internal Medicine

## 2020-08-05 DIAGNOSIS — R7989 Other specified abnormal findings of blood chemistry: Secondary | ICD-10-CM | POA: Diagnosis not present

## 2020-08-05 DIAGNOSIS — D61818 Other pancytopenia: Secondary | ICD-10-CM | POA: Diagnosis not present

## 2020-08-08 ENCOUNTER — Ambulatory Visit
Admission: RE | Admit: 2020-08-08 | Discharge: 2020-08-08 | Disposition: A | Discharge status: Home / Self Care | Payer: Medicare HMO | Source: Ambulatory Visit | Attending: Internal Medicine | Admitting: Internal Medicine

## 2020-08-08 DIAGNOSIS — R7989 Other specified abnormal findings of blood chemistry: Secondary | ICD-10-CM

## 2020-08-08 DIAGNOSIS — R7401 Elevation of levels of liver transaminase levels: Secondary | ICD-10-CM | POA: Diagnosis not present

## 2020-08-11 ENCOUNTER — Emergency Department (HOSPITAL_COMMUNITY): Payer: Medicare HMO

## 2020-08-11 ENCOUNTER — Other Ambulatory Visit: Payer: Self-pay

## 2020-08-11 ENCOUNTER — Emergency Department (HOSPITAL_COMMUNITY)
Admission: EM | Admit: 2020-08-11 | Discharge: 2020-08-11 | Disposition: A | Discharge status: Home / Self Care | Payer: Medicare HMO | Attending: Emergency Medicine | Admitting: Emergency Medicine

## 2020-08-11 ENCOUNTER — Encounter (HOSPITAL_COMMUNITY): Payer: Self-pay

## 2020-08-11 DIAGNOSIS — R0602 Shortness of breath: Secondary | ICD-10-CM | POA: Diagnosis not present

## 2020-08-11 DIAGNOSIS — J45909 Unspecified asthma, uncomplicated: Secondary | ICD-10-CM | POA: Insufficient documentation

## 2020-08-11 DIAGNOSIS — I10 Essential (primary) hypertension: Secondary | ICD-10-CM | POA: Diagnosis not present

## 2020-08-11 DIAGNOSIS — Z7982 Long term (current) use of aspirin: Secondary | ICD-10-CM | POA: Diagnosis not present

## 2020-08-11 DIAGNOSIS — Z7951 Long term (current) use of inhaled steroids: Secondary | ICD-10-CM | POA: Insufficient documentation

## 2020-08-11 DIAGNOSIS — R509 Fever, unspecified: Secondary | ICD-10-CM | POA: Diagnosis not present

## 2020-08-11 DIAGNOSIS — R112 Nausea with vomiting, unspecified: Secondary | ICD-10-CM | POA: Insufficient documentation

## 2020-08-11 DIAGNOSIS — R1084 Generalized abdominal pain: Secondary | ICD-10-CM | POA: Diagnosis not present

## 2020-08-11 DIAGNOSIS — Z79899 Other long term (current) drug therapy: Secondary | ICD-10-CM | POA: Diagnosis not present

## 2020-08-11 DIAGNOSIS — K760 Fatty (change of) liver, not elsewhere classified: Secondary | ICD-10-CM | POA: Diagnosis not present

## 2020-08-11 DIAGNOSIS — R109 Unspecified abdominal pain: Secondary | ICD-10-CM | POA: Diagnosis not present

## 2020-08-11 DIAGNOSIS — R1011 Right upper quadrant pain: Secondary | ICD-10-CM | POA: Insufficient documentation

## 2020-08-11 DIAGNOSIS — Z20822 Contact with and (suspected) exposure to covid-19: Secondary | ICD-10-CM | POA: Insufficient documentation

## 2020-08-11 DIAGNOSIS — R000 Tachycardia, unspecified: Secondary | ICD-10-CM | POA: Diagnosis not present

## 2020-08-11 DIAGNOSIS — R5383 Other fatigue: Secondary | ICD-10-CM | POA: Insufficient documentation

## 2020-08-11 LAB — CBC WITH DIFFERENTIAL/PLATELET
Abs Immature Granulocytes: 0.03 10*3/uL (ref 0.00–0.07)
Basophils Absolute: 0.2 10*3/uL — ABNORMAL HIGH (ref 0.0–0.1)
Basophils Relative: 2 %
Eosinophils Absolute: 0.1 10*3/uL (ref 0.0–0.5)
Eosinophils Relative: 1 %
HCT: 48.8 % — ABNORMAL HIGH (ref 36.0–46.0)
Hemoglobin: 16.1 g/dL — ABNORMAL HIGH (ref 12.0–15.0)
Immature Granulocytes: 0 %
Lymphocytes Relative: 65 %
Lymphs Abs: 7 10*3/uL — ABNORMAL HIGH (ref 0.7–4.0)
MCH: 30.4 pg (ref 26.0–34.0)
MCHC: 33 g/dL (ref 30.0–36.0)
MCV: 92.1 fL (ref 80.0–100.0)
Monocytes Absolute: 0.7 10*3/uL (ref 0.1–1.0)
Monocytes Relative: 6 %
Neutro Abs: 2.8 10*3/uL (ref 1.7–7.7)
Neutrophils Relative %: 26 %
Platelets: 138 10*3/uL — ABNORMAL LOW (ref 150–400)
RBC: 5.3 MIL/uL — ABNORMAL HIGH (ref 3.87–5.11)
RDW: 13.2 % (ref 11.5–15.5)
WBC: 10.7 10*3/uL — ABNORMAL HIGH (ref 4.0–10.5)
nRBC: 0 % (ref 0.0–0.2)

## 2020-08-11 LAB — COMPREHENSIVE METABOLIC PANEL
ALT: 134 U/L — ABNORMAL HIGH (ref 0–44)
AST: 137 U/L — ABNORMAL HIGH (ref 15–41)
Albumin: 2.9 g/dL — ABNORMAL LOW (ref 3.5–5.0)
Alkaline Phosphatase: 99 U/L (ref 38–126)
Anion gap: 9 (ref 5–15)
BUN: 11 mg/dL (ref 8–23)
CO2: 27 mmol/L (ref 22–32)
Calcium: 9 mg/dL (ref 8.9–10.3)
Chloride: 97 mmol/L — ABNORMAL LOW (ref 98–111)
Creatinine, Ser: 0.88 mg/dL (ref 0.44–1.00)
GFR, Estimated: >60 mL/min (ref >60)
Glucose, Bld: 147 mg/dL — ABNORMAL HIGH (ref 70–99)
Potassium: 4.4 mmol/L (ref 3.5–5.1)
Sodium: 133 mmol/L — ABNORMAL LOW (ref 135–145)
Total Bilirubin: 0.7 mg/dL (ref 0.3–1.2)
Total Protein: 6.9 g/dL (ref 6.5–8.1)

## 2020-08-11 LAB — URINALYSIS, ROUTINE W REFLEX MICROSCOPIC
Bilirubin Urine: NEGATIVE
Glucose, UA: NEGATIVE mg/dL
Hgb urine dipstick: NEGATIVE
Ketones, ur: NEGATIVE mg/dL
Leukocytes,Ua: NEGATIVE
Nitrite: NEGATIVE
Protein, ur: NEGATIVE mg/dL
Specific Gravity, Urine: 1.019 (ref 1.005–1.030)
pH: 5 (ref 5.0–8.0)

## 2020-08-11 LAB — SEDIMENTATION RATE: Sed Rate: 5 mm/hr (ref 0–22)

## 2020-08-11 LAB — LIPASE, BLOOD: Lipase: 46 U/L (ref 11–51)

## 2020-08-11 LAB — C-REACTIVE PROTEIN: CRP: 3.6 mg/dL — ABNORMAL HIGH (ref <1.0)

## 2020-08-11 LAB — RESP PANEL BY RT-PCR (FLU A&B, COVID) ARPGX2
Influenza A by PCR: NEGATIVE
Influenza B by PCR: NEGATIVE
SARS Coronavirus 2 by RT PCR: NEGATIVE

## 2020-08-11 LAB — LACTIC ACID, PLASMA: Lactic Acid, Venous: 1.4 mmol/L (ref 0.5–1.9)

## 2020-08-11 MED ORDER — ONDANSETRON HCL 4 MG/2ML IJ SOLN: 4.0000 mg | Freq: Once | INTRAMUSCULAR | Status: DC

## 2020-08-11 MED ORDER — ONDANSETRON 4 MG PO TBDP: ORAL_TABLET | ORAL | 0 refills | Status: DC

## 2020-08-11 MED ORDER — MORPHINE SULFATE (PF) 4 MG/ML IV SOLN
4.0000 mg | Freq: Once | INTRAVENOUS | Status: AC
  Administered 2020-08-11: 4 mg via INTRAVENOUS
  Filled 2020-08-11: qty 1

## 2020-08-11 MED ORDER — IOHEXOL 300 MG/ML  SOLN
100.0000 mL | Freq: Once | INTRAMUSCULAR | Status: AC | PRN
  Administered 2020-08-11: 100 mL via INTRAVENOUS

## 2020-08-11 MED ORDER — ONDANSETRON 4 MG PO TBDP
4.0000 mg | ORAL_TABLET | Freq: Once | ORAL | Status: DC
  Administered 2020-08-11: 4 mg via ORAL
  Filled 2020-08-11: qty 1

## 2020-08-11 MED ORDER — DICYCLOMINE HCL 20 MG PO TABS: 20.0000 mg | ORAL_TABLET | Freq: Two times a day (BID) | ORAL | 0 refills | Status: DC

## 2020-08-11 MED ORDER — SODIUM CHLORIDE 0.9 % IV BOLUS
1000.0000 mL | Freq: Once | INTRAVENOUS | Status: AC
  Administered 2020-08-11: 1000 mL via INTRAVENOUS

## 2020-08-11 NOTE — ED Provider Notes (Signed)
With nightEmergency Medicine Provider Triage Evaluation Note  Pamela Daniel , a 70 y.o. female  was evaluated in triage.  Pt complains of nausea, vomiting, abdominal pain.  Symptoms of been going on for 3 days.  Additionally, she has had weakness and fatigue for a month time fevers and leg pain.  Review of Systems  Positive: N/v, abd pain Negative: diarrhea  Physical Exam  BP 140/85 (BP Location: Left Arm)   Pulse (!) 120   Temp 99.4 F (37.4 C) (Oral)   Resp 18   SpO2 94%  Gen:   Awake, no distress   Resp:  Normal effort  MSK:   Moves extremities without difficulty  Other:  Mild TTP of lower abdomen, worse on the left side.  No CVA tenderness  Medical Decision Making  Medically screening exam initiated at 11:58 AM.  Appropriate orders placed.  Pamela Daniel was informed that the remainder of the evaluation will be completed by another provider, this initial triage assessment does not replace that evaluation, and the importance of remaining in the ED until their evaluation is complete.  Labs, ua, meds.   Franchot Heidelberg, PA-C 08/11/20 1159    Daleen Bo, MD 08/12/20 1746

## 2020-08-11 NOTE — ED Provider Notes (Signed)
[facility] EMERGENCY DEPARTMENT Provider Note   CSN: [id number] Arrival date & time: 08/11/20  1045     History No chief complaint on file.   Pamela Daniel is a 70 y.o. female.  Pamela Daniel is a 70 y.o. female  with a PMH of CHF and asthma who presents to the ED complaining of not being able to eat or drink secondary to nausea, vomiting and abdominal pain.  She reports that the nausea started this past Thursday, she has not started vomiting.  She states she has not been able to keep food down secondary to her nausea. She took Metoclopramide with some benefit, and was able to eat stew following one dose.  Symptoms worsened today.  She has not been able to keep down liquids or solids.  Patient reports that she feels hungry but that when she goes to eat nothing seems appetizing.  She reports she has had diffuse abdominal discomfort and cramping and often feels bloated.  Patient does report that she has been having normal daily bowel movements and passing gas.  Patient also reports that over the past 3 weeks she has been having severe pain in bilateral legs at night primarily.  She reports with this she feels like her feet get cold and then she develops a fever.  She reports she is checked her temperature several times and it is often around 101.  She reports this pain often keeps her from sleeping, but in the morning she is no longer febrile and pain seems to resolve.  With this she is also felt extremely fatigued and generally weak.  No syncope.  Patient also thinks that she has lost about 10 pounds unintentionally over the past month.  She has been seen by her PCP once since the symptoms began, he sent her for a right upper quadrant ultrasound that was overall unremarkable, she was noted to have mildly elevated liver enzymes.  Patient reports that her doctor expressed concern that she may have cancer and wanted to refer her to an oncologist for further work-up.  The  history is provided by the patient and a relative.      Past Medical History:  Diagnosis Date   Anxiety    Arthritis    SPINE   Asthma    Blood transfusion without reported diagnosis    Fatty liver    Fatty pancreas    First degree heart block    Frequency of urination    GERD (gastroesophageal reflux disease)    Hemorrhoids    Hepatic steatosis    Hiatal hernia    POST RESIDUAL SMALL HH PER IMAGING   History of acute pancreatitis 03/16/2015   History of adenomatous polyp of colon    tubular adenoma's 04-08-2014/   hyperplastic bening polyp 2000   History of concussion 2012   no residual   History of transient ischemic attack (TIA) 12/22/2011   no residual   History of vaginal dysplasia 2016   Hypertension    Pancreatic cyst    Pseudocyst of pancreas    PSVT (paroxysmal supraventricular tachycardia) ([place])    cardioloigst-  dr Einar Gip--- last visit 2013 per pt and is currently followed by pcp   Rectal bleeding    S/P AV (atrioventricular) nodal ablation 01-21-2010    dr Caryl Comes   Simple renal cyst    bilateral per imaging   Urgency of urination    Urinary frequency    Urinary leakage  Patient Active Problem List   Diagnosis Date Noted   Lumbar facet arthropathy 04/29/2020   Lumbar degenerative disc disease 04/29/2020   Chronic pain syndrome 04/29/2020   Chest pain 01/16/2020   Unstable angina ([place]) 01/16/2020   Acute pancreatitis    Cough    Pancreatitis, acute    Epigastric abdominal pain    Pancreatitis 03/16/2015   VAIN I (vaginal intraepithelial neoplasia grade I) 04/17/2014   TIA (transient ischemic attack) 12/22/2011   SVT/ PSVT/ PAT 01/13/2010   RECTAL BLEEDING 08/01/2007   DIARRHEA 08/01/2007   ESOPHAGEAL STRICTURE 07/31/2007   HIATAL HERNIA 07/31/2007   COLONIC POLYPS, HX OF 07/31/2007   HYPERTENSION 03/14/2007   G E R D 03/14/2007    Past Surgical History:  Procedure Laterality Date   BREAST EXCISIONAL BIOPSY Right    CARDIAC  ELECTROPHYSIOLOGY MAPPING AND ABLATION  01-21-2010   dr Caryl Comes   ablation AVNRT   FOOT NEUROMA SURGERY Left 1996   HEMORRHOID SURGERY N/A 10/28/2016   Procedure: HEMORRHOIDECTOMY AND HEMORRHOIDAL PEXY;  Surgeon: Leighton Ruff, MD;  Location: [facility];  Service: General;  Laterality: N/A;   LAPAROSCOPY TAKEDOWN AND REPAIR HIATAL HERNIA /  NISSEN FUNDOPLATION  09-01-2005    dr Hassell Done  [facility]   LEFT HEART CATH AND CORONARY ANGIOGRAPHY N/A 01/17/2020   Procedure: LEFT HEART CATH AND CORONARY ANGIOGRAPHY;  Surgeon: Dionisio David, MD;  Location: [facility];  Service: Cardiovascular;  Laterality: N/A;   NASAL SEPTUM SURGERY  1991   TEE WITHOUT CARDIOVERSION  02/15/2012   Procedure: TRANSESOPHAGEAL ECHOCARDIOGRAM (TEE);  Surgeon: Laverda Page, MD;  Location: [facility] ENDOSCOPY;  Service: Cardiovascular;  Laterality: N/A;  normal LV, normal EF, mild MR, trace TR, trace PI   TRANSTHORACIC ECHOCARDIOGRAM  12-22-2011    dr Einar Gip   normal echo   VAGINAL HYSTERECTOMY  1996   w/  BSO     OB History     Gravida  4   Para  4   Term      Preterm  1   AB      Living  3      SAB      IAB      Ectopic      Multiple      Live Births              Family History  Problem Relation Age of Onset   Heart disease Mother 22       Heart failure >> death at age 37   Dementia Mother    Hypertension Mother    Breast cancer Mother 14   Colon cancer Mother    Heart disease Father 58       MI   COPD Father    Heart disease Brother    Mental illness Brother    Lung cancer Brother 36   Bone cancer Brother    Lung cancer Brother    Bone cancer Brother    Colon cancer Brother    Breast cancer Maternal Aunt    Breast cancer Cousin    Esophageal cancer Neg Hx    Stomach cancer Neg Hx    Rectal cancer Neg Hx     Social History   Tobacco Use   Smoking status: Never   Smokeless tobacco: Never  Vaping Use   Vaping Use: Never used  Substance Use Topics    Alcohol use: No   Drug use: No    Home  Medications Prior to Admission medications   Medication Sig Start Date End Date Taking? Authorizing Provider  dicyclomine (BENTYL) 20 MG tablet Take 1 tablet (20 mg total) by mouth 2 (two) times daily. 08/11/20  Yes Jacqlyn Larsen, PA-C  ondansetron (ZOFRAN ODT) 4 MG disintegrating tablet 63m ODT q4 hours prn nausea/vomit 08/11/20  Yes FJacqlyn Larsen PA-C  acetaminophen (TYLENOL) 325 MG tablet Take 650 mg by mouth every 6 (six) hours as needed for mild pain.    [provider]  albuterol (PROVENTIL) (2.5 MG/3ML) 0.083% nebulizer solution Take 3 mLs (2.5 mg total) by nebulization every 6 (six) hours as needed for wheezing or shortness of breath. 02/28/18   YTasia Catchings Amy V, PA-C  ALPRAZolam (Duanne Moron 0.25 MG tablet Take by mouth daily as needed for anxiety. .554m   [provider]  aspirin 325 MG tablet Take 325 mg by mouth daily.    [provider]  budesonide-formoterol (SYMBICORT) 160-4.5 MCG/ACT inhaler Inhale 2 puffs into the lungs daily as needed (Shortness of breath). Patient not taking: No sig reported    [provider]  Cholecalciferol (VITAMIN D3) 1.25 MG (50000 UT) CAPS 1 capsule 02/05/20   [provider]  ibuprofen (ADVIL,MOTRIN) 200 MG tablet Take 800 mg by mouth every 8 (eight) hours as needed for mild pain.    [provider]  nitroGLYCERIN (NITROSTAT) 0.4 MG SL tablet Place 0.4 mg under the tongue every 5 (five) minutes as needed for chest pain.    [provider]  pantoprazole (PROTONIX) 40 MG tablet  04/02/20   [provider]  predniSONE (DELTASONE) 50 MG tablet Take one tablet once daily for five days. 07/29/20   WoLannie FieldsPA-C  rosuvastatin (CRESTOR) 20 MG tablet Take 20 mg by mouth at bedtime. 01/16/20   [provider]  spironolactone (ALDACTONE) 25 MG tablet Take 25 mg by mouth daily. 11/24/19   [provider]    Allergies    Patient has no known  allergies.  Review of Systems   Review of Systems  Constitutional:  Positive for chills, fatigue, fever and unexpected weight change.  HENT: Negative.    Respiratory:  Negative for cough and shortness of breath.   Cardiovascular:  Negative for chest pain.  Gastrointestinal:  Positive for abdominal pain, nausea and vomiting. Negative for constipation and diarrhea.  Genitourinary:  Negative for dysuria and frequency.  Musculoskeletal:  Positive for myalgias. Negative for arthralgias.  Skin:  Negative for color change, rash and wound.  Neurological:  Negative for dizziness, syncope, weakness, light-headedness, numbness and headaches.  All other systems reviewed and are negative.  Physical Exam Updated Vital Signs BP 128/77 (BP Location: Left Arm)   Pulse (!) 112   Temp 99.6 F (37.6 C) (Oral)   Resp 16   SpO2 93%   Physical Exam Vitals and nursing note reviewed.  Constitutional:      General: She is not in acute distress.    Appearance: Normal appearance. She is well-developed. She is not diaphoretic.     Comments: Alert, chronically ill-appearing but in no acute distress.  HENT:     Head: Normocephalic and atraumatic.  Eyes:     General:        Right eye: No discharge.        Left eye: No discharge.     Pupils: Pupils are equal, round, and reactive to light.  Cardiovascular:     Rate and Rhythm: Normal rate and regular rhythm.  Pulses: Normal pulses.          Radial pulses are 2+ on the right side and 2+ on the left side.       Dorsalis pedis pulses are 2+ on the right side and 2+ on the left side.     Heart sounds: Normal heart sounds.  Pulmonary:     Effort: Pulmonary effort is normal. No respiratory distress.     Breath sounds: Normal breath sounds. No wheezing or rales.     Comments: Respirations equal and unlabored, patient able to speak in full sentences, lungs clear to auscultation bilaterally  Abdominal:     General: Bowel sounds are normal. There is no  distension.     Palpations: Abdomen is soft. There is no mass.     Tenderness: There is abdominal tenderness. There is no guarding.     Comments: Abdomen is soft, nondistended, bowel sounds present throughout, there is minimal generalized tenderness that does not localize to 1 quadrant, no guarding or peritoneal signs  Musculoskeletal:        General: No deformity.     Cervical back: Neck supple.     Right lower leg: No edema.     Left lower leg: No edema.  Skin:    General: Skin is warm and dry.     Capillary Refill: Capillary refill takes less than 2 seconds.  Neurological:     Mental Status: She is alert and oriented to person, place, and time.     Coordination: Coordination normal.     Comments: Speech is clear, able to follow commands CN III-XII intact Normal strength in upper and lower extremities bilaterally including dorsiflexion and plantar flexion, strong and equal grip strength Sensation normal to light and sharp touch Moves extremities without ataxia, coordination intact  Psychiatric:        Mood and Affect: Mood normal.        Behavior: Behavior normal.    ED Results / Procedures / Treatments   Labs (all labs ordered are listed, but only abnormal results are displayed) Labs Reviewed  CBC WITH DIFFERENTIAL/PLATELET - Abnormal; Notable for the following components:      Result Value   WBC 10.7 (*)    RBC 5.30 (*)    Hemoglobin 16.1 (*)    HCT 48.8 (*)    Platelets 138 (*)    Lymphs Abs 7.0 (*)    Basophils Absolute 0.2 (*)    All other components within normal limits  COMPREHENSIVE METABOLIC PANEL - Abnormal; Notable for the following components:   Sodium 133 (*)    Chloride 97 (*)    Glucose, Bld 147 (*)    Albumin 2.9 (*)    AST 137 (*)    ALT 134 (*)    All other components within normal limits  URINALYSIS, ROUTINE W REFLEX MICROSCOPIC - Abnormal; Notable for the following components:   Color, Urine AMBER (*)    All other components within normal limits   C-REACTIVE PROTEIN - Abnormal; Notable for the following components:   CRP 3.6 (*)    All other components within normal limits  RESP PANEL BY RT-PCR (FLU A&B, COVID) ARPGX2  CULTURE, BLOOD (ROUTINE X 2)  CULTURE, BLOOD (ROUTINE X 2)  LIPASE, BLOOD  HEPATITIS PANEL, ACUTE  SEDIMENTATION RATE  LACTIC ACID, PLASMA    EKG EKG Interpretation  Date/Time:  Monday August 11 2020 11:44:38 EDT Ventricular Rate:  116 PR Interval:  160 QRS Duration: 68 QT Interval:  324 QTC Calculation: 450 R Axis:   42 Text Interpretation: Sinus tachycardia with Premature atrial complexes Otherwise normal ECG Since last tracing rate faster Confirmed by Wandra Arthurs [phone]) on 08/11/2020 5:05:04 PM  Radiology DG Chest 2 View  Result Date: 08/11/2020 CLINICAL DATA:  fever, n/v, shortness of breath EXAM: CHEST - 2 VIEW COMPARISON:  07/29/2020 FINDINGS: The heart size and mediastinal contours are within normal limits. Atherosclerotic calcification of the aortic knob. No focal airspace consolidation, pleural effusion, or pneumothorax. No acute bony findings. IMPRESSION: No active cardiopulmonary disease. Electronically Signed   By: Davina Poke D.O.   On: 08/11/2020 18:56   CT ABDOMEN PELVIS W CONTRAST  Result Date: 08/11/2020 CLINICAL DATA:  Abdominal pain, acute, nonlocalized EXAM: CT ABDOMEN AND PELVIS WITH CONTRAST TECHNIQUE: Multidetector CT imaging of the abdomen and pelvis was performed using the standard protocol following bolus administration of intravenous contrast. CONTRAST:  19m OMNIPAQUE IOHEXOL 300 MG/ML  SOLN COMPARISON:  CT abdomen pelvis 01/24/2019 FINDINGS: Lower chest: No acute abnormality. Hepatobiliary: Hepatic steatosis. No gallstones, gallbladder wall thickening, or biliary dilatation. Pancreas: Fatty atrophy, unchanged from prior exam. No ductal dilation. Spleen: Normal in size without focal abnormality. Adrenals/Urinary Tract: Adrenal glands are unremarkable. No hydronephrosis. No  nephrolithiasis. There are small bilateral renal cysts. Bladder is minimally distended. Stomach/Bowel: Postsurgical changes near the gastroesophageal junction. The stomach is within normal limits otherwise. There is no evidence of bowel obstruction. The appendix is normal. The large bowel is unremarkable. Vascular/Lymphatic: No significant vascular findings are present. No enlarged abdominal or pelvic lymph nodes. Reproductive: Prior hysterectomy. Other: No abdominal wall hernia or abnormality. No abdominopelvic ascites. Musculoskeletal: No acute osseous abnormality. Sacralized L5 with grade 1 anterolisthesis at L4-L5. Bilateral facet arthropathy at L4-L5. IMPRESSION: No acute abdominopelvic abnormality.  Normal appendix. Hepatic steatosis. Electronically Signed   By: JMaurine Simmering  On: 08/11/2020 20:13    Procedures Procedures   Medications Ordered in ED Medications  ondansetron ([facility] injection 4 mg (4 mg Intravenous Not Given 08/11/20 1845)  sodium chloride 0.9 % bolus 1,000 mL (0 mLs Intravenous Stopped 08/11/20 2225)  morphine 4 MG/ML injection 4 mg (4 mg Intravenous Given 08/11/20 1931)  iohexol (OMNIPAQUE) 300 MG/ML solution 100 mL (100 mLs Intravenous Contrast Given 08/11/20 1948)    ED Course  I have reviewed the triage vital signs and the nursing notes.  Pertinent labs & imaging results that were available during my care of the patient were reviewed by me and considered in my medical decision making (see chart for details).  MDM Rules/Calculators/A&P                          70y.o. female presents to the ED with complaints of abdominal pain, nausea and vomiting as well as 3 weeks of intermittent fevers and leg cramping, this involves an extensive number of treatment options, and is a complaint that carries with it a high risk of complications and morbidity.  We will evaluate for acute intra-abdominal pathology with labs and CT abdomen pelvis, will also do some additional evaluation of  these persistent fevers.  Today patient is currently not experiencing any leg pain and bilateral lower extremities are warm and well-perfused, low suspicion for emergent vascular issue.  On arrival pt is nontoxic, vitals significant for mild tachycardia, otherwise unremarkable.  Fortunately patient is afebrile today.  Exam significant for mild generalized abdominal tenderness.  All extremities with 2+ distal pulses.  No other  focal findings on exam.  Additional history obtained from daughter at bedside. Previous records obtained and reviewed via EMR  I ordered IV fluids, pain and nausea medication  Lab Tests:  I Ordered, reviewed, and interpreted labs, which included:  CBC: Minimal leukocytosis, hemoglobin is slightly elevated as well, suspect hemoconcentration in the setting of vomiting and poor p.o. intake CMP: Mild hyponatremia and hypochloremia, again likely in the setting of dehydration, glucose 147, normal renal function, LFTs minimally elevated, normal alk phos and bili.   Lactic acid: WNL Blood culture: Pending CRP is mildly elevated, but ESR is normal Hepatitis panel: Pending UA: No signs of infection   Imaging Studies ordered:  I ordered imaging studies which included chest x-ray, CT abdomen pelvis, I independently visualized and interpreted imaging which showed  CXR-no active cardiopulmonary disease, patient also had recent CT angio which was reassuring CTAP-no acute abdominal pelvic abnormalities  ED Course:   I discussed reassuring work-up with patient and her daughter, CT is reassuring without mass or other abnormality and lab work looks good as well.  Patient is aware she has 1 cultures pending will be called if these return positive.  Recommend she continue to follow with her primary care doctor for continued evaluation of the symptoms.  Will prescribe Bentyl and Zofran for help with symptomatic control.  Patient has been able to tolerate p.o. fluids here after medication  and is feeling much better.  Discharged home in good condition.    Portions of this note were generated with [employer]. Dictation errors may occur despite best attempts at proofreading.   Final Clinical Impression(s) / ED Diagnoses Final diagnoses:  Generalized abdominal pain  Non-intractable vomiting with nausea, unspecified vomiting type    Rx / DC Orders ED Discharge Orders          Ordered    ondansetron (ZOFRAN ODT) 4 MG disintegrating tablet        08/11/20 2205    dicyclomine (BENTYL) 20 MG tablet  2 times daily        08/11/20 2205             Jacqlyn Larsen, PA-C 08/13/20 0156    Drenda Freeze, MD 08/13/20 [phone]

## 2020-08-11 NOTE — Discharge Instructions (Addendum)
Your evaluation today has been reassuring.  CT scan does not show any masses or other acute abnormalities.  Use Zofran as needed for nausea and vomiting and Bentyl to help with abdominal discomfort.  Advance your diet slowly.  Please continue to follow-up with your regular doctor regarding the symptoms.

## 2020-08-11 NOTE — ED Triage Notes (Signed)
Patient complains of ongoing weakness and fatigue x 1 month. States that her MD thinks she may have cancer. Reports at night chills and fever. States at night has pain in legs and feet

## 2020-08-11 NOTE — ED Notes (Signed)
Pt discharged and wheeled out of the ED in a wheel chair without difficulty. 

## 2020-08-12 LAB — HEPATITIS PANEL, ACUTE
HCV Ab: NONREACTIVE
Hep A IgM: NONREACTIVE
Hep B C IgM: NONREACTIVE
Hepatitis B Surface Ag: NONREACTIVE

## 2020-08-14 DIAGNOSIS — G4733 Obstructive sleep apnea (adult) (pediatric): Secondary | ICD-10-CM | POA: Diagnosis not present

## 2020-08-15 ENCOUNTER — Telehealth: Payer: Self-pay | Admitting: Hematology and Oncology

## 2020-08-15 NOTE — Telephone Encounter (Signed)
Received a new hem referral from Dr. Ardeth Perfect for pancytopenia. Pamela Daniel has been cld and scheduled to see Dr. Lorenso Courier on 8/5 at 11am. Pt aware to arrive 20 minutes early. I cld and lft [name] at [facility] a vm to fax the pt's labs and office notes that weren't attached to the referral.

## 2020-08-16 LAB — CULTURE, BLOOD (ROUTINE X 2)
Culture: NO GROWTH
Culture: NO GROWTH
Special Requests: ADEQUATE

## 2020-08-22 ENCOUNTER — Inpatient Hospital Stay: Payer: Medicare HMO

## 2020-08-22 ENCOUNTER — Other Ambulatory Visit: Payer: Self-pay

## 2020-08-22 ENCOUNTER — Inpatient Hospital Stay: Payer: Medicare HMO | Attending: Hematology and Oncology | Admitting: Hematology and Oncology

## 2020-08-22 VITALS — BP 142/76 | HR 98 | Temp 98.3°F | Resp 19 | Ht 61.0 in | Wt 180.1 lb

## 2020-08-22 DIAGNOSIS — K760 Fatty (change of) liver, not elsewhere classified: Secondary | ICD-10-CM | POA: Diagnosis not present

## 2020-08-22 DIAGNOSIS — D61818 Other pancytopenia: Secondary | ICD-10-CM | POA: Insufficient documentation

## 2020-08-22 DIAGNOSIS — D696 Thrombocytopenia, unspecified: Secondary | ICD-10-CM | POA: Diagnosis not present

## 2020-08-22 DIAGNOSIS — R531 Weakness: Secondary | ICD-10-CM | POA: Insufficient documentation

## 2020-08-22 DIAGNOSIS — D751 Secondary polycythemia: Secondary | ICD-10-CM

## 2020-08-22 LAB — CBC WITH DIFFERENTIAL (CANCER CENTER ONLY)
Abs Immature Granulocytes: 0.02 10*3/uL (ref 0.00–0.07)
Basophils Absolute: 0.1 10*3/uL (ref 0.0–0.1)
Basophils Relative: 1 %
Eosinophils Absolute: 0.2 10*3/uL (ref 0.0–0.5)
Eosinophils Relative: 3 %
HCT: 41.1 % (ref 36.0–46.0)
Hemoglobin: 13.7 g/dL (ref 12.0–15.0)
Immature Granulocytes: 0 %
Lymphocytes Relative: 61 %
Lymphs Abs: 3.5 10*3/uL (ref 0.7–4.0)
MCH: 30.8 pg (ref 26.0–34.0)
MCHC: 33.3 g/dL (ref 30.0–36.0)
MCV: 92.4 fL (ref 80.0–100.0)
Monocytes Absolute: 0.7 10*3/uL (ref 0.1–1.0)
Monocytes Relative: 11 %
Neutro Abs: 1.4 10*3/uL — ABNORMAL LOW (ref 1.7–7.7)
Neutrophils Relative %: 24 %
Platelet Count: 152 10*3/uL (ref 150–400)
RBC: 4.45 MIL/uL (ref 3.87–5.11)
RDW: 14.3 % (ref 11.5–15.5)
WBC Count: 5.8 10*3/uL (ref 4.0–10.5)
nRBC: 0 % (ref 0.0–0.2)

## 2020-08-22 LAB — CMP (CANCER CENTER ONLY)
ALT: 33 U/L (ref 0–44)
AST: 43 U/L — ABNORMAL HIGH (ref 15–41)
Albumin: 2.7 g/dL — ABNORMAL LOW (ref 3.5–5.0)
Alkaline Phosphatase: 86 U/L (ref 38–126)
Anion gap: 8 (ref 5–15)
BUN: 10 mg/dL (ref 8–23)
CO2: 25 mmol/L (ref 22–32)
Calcium: 9 mg/dL (ref 8.9–10.3)
Chloride: 105 mmol/L (ref 98–111)
Creatinine: 0.7 mg/dL (ref 0.44–1.00)
GFR, Estimated: >60 mL/min (ref >60)
Glucose, Bld: 122 mg/dL — ABNORMAL HIGH (ref 70–99)
Potassium: 4 mmol/L (ref 3.5–5.1)
Sodium: 138 mmol/L (ref 135–145)
Total Bilirubin: 0.6 mg/dL (ref 0.3–1.2)
Total Protein: 6.7 g/dL (ref 6.5–8.1)

## 2020-08-22 LAB — VITAMIN B12: Vitamin B-12: 408 pg/mL (ref 180–914)

## 2020-08-22 LAB — TSH: TSH: 2.824 u[IU]/mL (ref 0.308–3.960)

## 2020-08-22 LAB — SAVE SMEAR(SSMR), FOR PROVIDER SLIDE REVIEW

## 2020-08-22 LAB — FOLATE: Folate: 14.4 ng/mL (ref >5.9)

## 2020-08-22 LAB — LACTATE DEHYDROGENASE: LDH: 248 U/L — ABNORMAL HIGH (ref 98–192)

## 2020-08-22 NOTE — Progress Notes (Signed)
[place] [phone]   Fax:([phone] [place] NOTE  Patient Care Team: Velna Hatchet, MD as PCP - General (Internal Medicine) Terrance Mass, MD (Inactive) as Consulting Physician (Gynecology) Adrian Prows, MD as Consulting Physician (Cardiology)  Hematological/Oncological History # Thrombocytopenia # Polycythemia 04/11/2019: WBC 6.2, Hgb 15.7, MCV 92.4, Plt 259 07/29/2020: WBC 3.2, Hgb 15.2, MCV 92.1, Plt 142 08/11/2020: WBC 10.7, Hgb 16.1, MCV 92.1, Plt 138 08/22/2020: establish care with Dr. Lorenso Courier   CHIEF COMPLAINTS/PURPOSE OF CONSULTATION:  "Pancytopenia "  HISTORY OF PRESENTING ILLNESS:  Pamela Daniel 71 y.o. female with medical history significant for NAFLD, GERD, hemorrhoids, HTN, and pancreatic cyst who presents with thrombocytopenia and polycythemia.   On review of the previous records Pamela Daniel has a history of mild polycythemia and recent thrombocytopenia.  On 04/11/2019 the patient was found to have a hemoglobin of 15.7, MCV of 92.4, and a platelet count of 259.  Most recently on 08/12/2018 the patient was found to have hemoglobin 16.1 and a platelet count of 138.  Due to concern for this patient's hematological abnormalities that she was referred to hematology for further evaluation and management.  On exam today Pamela Daniel notes that she has been having trouble with weakness.  She notes that she does not feel like she has normal strength and that her legs are weaker than they should be.  She notes that her legs are cold and aching.  She notes that it does respond well to ibuprofen.  She notes that it took "effort to hold up her body".  She notes that she eats a regular diet and eats red meat about 2-3 times per week.  She does not eat any fish.  She does endorse having some occasional bruising on her extremities but no frank sources of bleeding.  She notes that she can "barely bumped the chair" and that she gets bruising.  She  does have an elevation in her LFTs but has not established with a hepatologist.  On further discussion she is a never smoker and does not currently drink alcohol.  She currently works as a Buyer, retail.  Her family history is remarkable for anemia in her mother due to vitamin B12 deficiency.  She notes that she is having some mental fogging and occasional episodes of shortness of breath.  She notes that she does have OSA and uses a CPAP machine.  A full 10 point ROS is otherwise negative.  MEDICAL HISTORY:  Past Medical History:  Diagnosis Date   Anxiety    Arthritis    SPINE   Asthma    Blood transfusion without reported diagnosis    Fatty liver    Fatty pancreas    First degree heart block    Frequency of urination    GERD (gastroesophageal reflux disease)    Hemorrhoids    Hepatic steatosis    Hiatal hernia    POST RESIDUAL SMALL HH PER IMAGING   History of acute pancreatitis 03/16/2015   History of adenomatous polyp of colon    tubular adenoma's 04-08-2014/   hyperplastic bening polyp 2000   History of concussion 2012   no residual   History of transient ischemic attack (TIA) 12/22/2011   no residual   History of vaginal dysplasia 2016   Hypertension    Pancreatic cyst    Pseudocyst of pancreas    PSVT (paroxysmal supraventricular tachycardia) ([place])    cardioloigst-  dr Einar Gip--- last visit 2013 per pt  and is currently followed by pcp   Rectal bleeding    S/P AV (atrioventricular) nodal ablation 01-21-2010    dr Caryl Comes   Simple renal cyst    bilateral per imaging   Urgency of urination    Urinary frequency    Urinary leakage     SURGICAL HISTORY: Past Surgical History:  Procedure Laterality Date   BREAST EXCISIONAL BIOPSY Right    CARDIAC ELECTROPHYSIOLOGY MAPPING AND ABLATION  01-21-2010   dr Caryl Comes   ablation AVNRT   FOOT NEUROMA SURGERY Left 1996   HEMORRHOID SURGERY N/A 10/28/2016   Procedure: HEMORRHOIDECTOMY AND HEMORRHOIDAL PEXY;  Surgeon: Leighton Ruff, MD;  Location: [facility];  Service: General;  Laterality: N/A;   LAPAROSCOPY TAKEDOWN AND REPAIR HIATAL HERNIA /  NISSEN FUNDOPLATION  09-01-2005    dr Hassell Done  [facility]   LEFT HEART CATH AND CORONARY ANGIOGRAPHY N/A 01/17/2020   Procedure: LEFT HEART CATH AND CORONARY ANGIOGRAPHY;  Surgeon: Dionisio David, MD;  Location: [facility];  Service: Cardiovascular;  Laterality: N/A;   NASAL SEPTUM SURGERY  1991   TEE WITHOUT CARDIOVERSION  02/15/2012   Procedure: TRANSESOPHAGEAL ECHOCARDIOGRAM (TEE);  Surgeon: Laverda Page, MD;  Location: [facility];  Service: Cardiovascular;  Laterality: N/A;  normal LV, normal EF, mild MR, trace TR, trace PI   TRANSTHORACIC ECHOCARDIOGRAM  12-22-2011    dr Einar Gip   normal echo   VAGINAL HYSTERECTOMY  1996   w/  BSO    SOCIAL HISTORY: Social History   Socioeconomic History   Marital status: Divorced    Spouse name: Not on file   Number of children: 3   Years of education: Not on file   Highest education level: Not on file  Occupational History   Not on file  Tobacco Use   Smoking status: Never   Smokeless tobacco: Never  Vaping Use   Vaping Use: Never used  Substance and Sexual Activity   Alcohol use: No   Drug use: No   Sexual activity: Yes    Comment: 1st intercourse 70 yo-More than 5 partners  Other Topics Concern   Not on file  Social History Narrative   Retired - previously employed at [employer]   Social Determinants of Radio broadcast assistant Strain: Not on file  Food Insecurity: Not on file  Transportation Needs: Not on file  Physical Activity: Not on file  Stress: Not on file  Social Connections: Not on file  Intimate Partner Violence: Not on file    FAMILY HISTORY: Family History  Problem Relation Age of Onset   Heart disease Mother 52       Heart failure >> death at age 44   Dementia Mother    Hypertension Mother    Breast cancer Mother 52   Colon cancer Mother    Heart  disease Father 65       MI   COPD Father    Heart disease Brother    Mental illness Brother    Lung cancer Brother 81   Bone cancer Brother    Lung cancer Brother    Bone cancer Brother    Colon cancer Brother    Breast cancer Maternal Aunt    Breast cancer Cousin    Esophageal cancer Neg Hx    Stomach cancer Neg Hx    Rectal cancer Neg Hx     ALLERGIES:  has No Known Allergies.  MEDICATIONS:  Current Outpatient Medications  Medication Sig Dispense Refill   budesonide-formoterol (SYMBICORT) 160-4.5 MCG/ACT inhaler Inhale 2 puffs into the lungs daily as needed (Shortness of breath).     albuterol (PROVENTIL) (2.5 MG/3ML) 0.083% nebulizer solution Take 3 mLs (2.5 mg total) by nebulization every 6 (six) hours as needed for wheezing or shortness of breath. 75 mL 0   ALPRAZolam (XANAX) 0.5 MG tablet Take 0.5 mg by mouth daily as needed.     aspirin 325 MG tablet Take 325 mg by mouth daily.     Cholecalciferol (VITAMIN D3) 1.25 MG (50000 UT) CAPS 1 capsule     dicyclomine (BENTYL) 20 MG tablet Take 1 tablet (20 mg total) by mouth 2 (two) times daily. 20 tablet 0   fluticasone (FLONASE) 50 MCG/ACT nasal spray 1 spray in each nostril     ibuprofen (ADVIL,MOTRIN) 200 MG tablet Take 800 mg by mouth every 8 (eight) hours as needed for mild pain.     nitroGLYCERIN (NITROSTAT) 0.4 MG SL tablet Place 0.4 mg under the tongue every 5 (five) minutes as needed for chest pain.     ondansetron (ZOFRAN ODT) 4 MG disintegrating tablet '4mg'$  ODT q4 hours prn nausea/vomit 10 tablet 0   pantoprazole (PROTONIX) 40 MG tablet      rosuvastatin (CRESTOR) 20 MG tablet Take 20 mg by mouth at bedtime.     spironolactone (ALDACTONE) 25 MG tablet Take 25 mg by mouth daily.     No current facility-administered medications for this visit.   Facility-Administered Medications Ordered in Other Visits  Medication Dose Route Frequency Provider Last Rate Last Admin   sodium chloride flush (NS) 0.9 % injection 3 mL  3 mL  Intravenous Q12H Dionisio David, MD        REVIEW OF SYSTEMS:   Constitutional: ( - ) fevers, ( - )  chills , ( - ) night sweats Eyes: ( - ) blurriness of vision, ( - ) double vision, ( - ) watery eyes Ears, nose, mouth, throat, and face: ( - ) mucositis, ( - ) sore throat Respiratory: ( - ) cough, ( - ) dyspnea, ( - ) wheezes Cardiovascular: ( - ) palpitation, ( - ) chest discomfort, ( - ) lower extremity swelling Gastrointestinal:  ( - ) nausea, ( - ) heartburn, ( - ) change in bowel habits Skin: ( - ) abnormal skin rashes Lymphatics: ( - ) new lymphadenopathy, ( - ) easy bruising Neurological: ( - ) numbness, ( - ) tingling, ( - ) new weaknesses Behavioral/Psych: ( - ) mood change, ( - ) new changes  All other systems were reviewed with the patient and are negative.  PHYSICAL EXAMINATION:  Vitals:   08/22/20 1054  BP: (!) 142/76  Pulse: 98  Resp: 19  Temp: 98.3 F (36.8 C)  SpO2: 99%   Filed Weights   08/22/20 1054  Weight: 180 lb 1.6 oz (81.7 kg)    GENERAL: well appearing elderly Caucasian female in NAD  SKIN: skin color, texture, turgor are normal, no rashes or significant lesions EYES: conjunctiva are pink and non-injected, sclera clear OROPHARYNX: no exudate, no erythema; lips, buccal mucosa, and tongue normal  NECK: supple, non-tender LYMPH:  no palpable lymphadenopathy in the cervical, axillary or supraclavicular lymph nodes.  LUNGS: clear to auscultation and percussion with normal breathing effort HEART: regular rate & rhythm and no murmurs and no lower extremity edema ABDOMEN: soft, non-tender, non-distended, normal bowel sounds Musculoskeletal: no cyanosis of digits and no clubbing  PSYCH:  alert & oriented x 3, fluent speech NEURO: no focal motor/sensory deficits  LABORATORY DATA:  I have reviewed the data as listed CBC Latest Ref Rng & Units 08/22/2020 08/11/2020 07/29/2020  WBC 4.0 - 10.5 K/uL 5.8 10.7(H) 3.2(L)  Hemoglobin 12.0 - 15.0 g/dL 13.7 16.1(H)  15.2(H)  Hematocrit 36.0 - 46.0 % 41.1 48.8(H) 44.5  Platelets 150 - 400 K/uL 152 138(L) 142(L)    CMP Latest Ref Rng & Units 08/22/2020 08/11/2020 07/29/2020  Glucose 70 - 99 mg/dL 122(H) 147(H) 165(H)  BUN 8 - 23 mg/dL '10 11 11  '$ Creatinine 0.44 - 1.00 mg/dL 0.70 0.88 0.71  Sodium 135 - 145 mmol/L 138 133(L) 136  Potassium 3.5 - 5.1 mmol/L 4.0 4.4 3.8  Chloride 98 - 111 mmol/L 105 97(L) 104  CO2 22 - 32 mmol/L '25 27 25  '$ Calcium 8.9 - 10.3 mg/dL 9.0 9.0 8.8(L)  Total Protein 6.5 - 8.1 g/dL 6.7 6.9 6.8  Total Bilirubin 0.3 - 1.2 mg/dL 0.6 0.7 0.7  Alkaline Phos 38 - 126 U/L 86 99 68  AST 15 - 41 U/L 43(H) 137(H) 126(H)  ALT 0 - 44 U/L 33 134(H) 97(H)    RADIOGRAPHIC STUDIES: DG Chest 2 View  Result Date: 08/11/2020 CLINICAL DATA:  fever, n/v, shortness of breath EXAM: CHEST - 2 VIEW COMPARISON:  07/29/2020 FINDINGS: The heart size and mediastinal contours are within normal limits. Atherosclerotic calcification of the aortic knob. No focal airspace consolidation, pleural effusion, or pneumothorax. No acute bony findings. IMPRESSION: No active cardiopulmonary disease. Electronically Signed   By: Davina Poke D.O.   On: 08/11/2020 18:56   CT ABDOMEN PELVIS W CONTRAST  Result Date: 08/11/2020 CLINICAL DATA:  Abdominal pain, acute, nonlocalized EXAM: CT ABDOMEN AND PELVIS WITH CONTRAST TECHNIQUE: Multidetector CT imaging of the abdomen and pelvis was performed using the standard protocol following bolus administration of intravenous contrast. CONTRAST:  166m OMNIPAQUE IOHEXOL 300 MG/ML  SOLN COMPARISON:  CT abdomen pelvis 01/24/2019 FINDINGS: Lower chest: No acute abnormality. Hepatobiliary: Hepatic steatosis. No gallstones, gallbladder wall thickening, or biliary dilatation. Pancreas: Fatty atrophy, unchanged from prior exam. No ductal dilation. Spleen: Normal in size without focal abnormality. Adrenals/Urinary Tract: Adrenal glands are unremarkable. No hydronephrosis. No nephrolithiasis.  There are small bilateral renal cysts. Bladder is minimally distended. Stomach/Bowel: Postsurgical changes near the gastroesophageal junction. The stomach is within normal limits otherwise. There is no evidence of bowel obstruction. The appendix is normal. The large bowel is unremarkable. Vascular/Lymphatic: No significant vascular findings are present. No enlarged abdominal or pelvic lymph nodes. Reproductive: Prior hysterectomy. Other: No abdominal wall hernia or abnormality. No abdominopelvic ascites. Musculoskeletal: No acute osseous abnormality. Sacralized L5 with grade 1 anterolisthesis at L4-L5. Bilateral facet arthropathy at L4-L5. IMPRESSION: No acute abdominopelvic abnormality.  Normal appendix. Hepatic steatosis. Electronically Signed   By: JMaurine Simmering  On: 08/11/2020 20:13   UKoreaAbdomen Limited RUQ (LIVER/GB)  Result Date: 08/08/2020 CLINICAL DATA:  70year old female with elevated liver enzymes. EXAM: ULTRASOUND ABDOMEN LIMITED RIGHT UPPER QUADRANT COMPARISON:  01/24/2019, 07/29/2020 FINDINGS: Gallbladder: No gallstones, pericholecystic fluid, or gallbladder wall thickening visualized. No sonographic Murphy sign noted by sonographer. Common bile duct: Diameter: 3 mm Liver: Limited evaluation of the hepatic parenchyma. Diffusely increased echogenicity, difficult to quantify. No definite evidence of hepatoma. Portal vein is patent on color Doppler imaging with normal direction of blood flow towards the liver. Other: No perihepatic ascites. IMPRESSION: Limited sonographic evaluation of the hepatic parenchyma, though suggestive of hepatic steatosis. Dylan  Serafina Royals, MD [facility] Electronically Signed   By: Ruthann Cancer MD   On: 08/08/2020 14:10    [place] 70 y.o. female with medical history significant for NAFLD, GERD, hemorrhoids, HTN, and pancreatic cyst who presents with thrombocytopenia and polycythemia.    After review of the labs, review of the records, and discussion with the patient the patients findings are most consistent with 2 different disease processes.  The polycythemia is likely secondary to the patient's history of OSA.  She does use CPAP machine and it may be in need of adjustment or more compliance.  The patient's thrombocytopenia may be secondary to liver disease given her elevations in LFTs and hepatic steatosis seen on imaging.  In order to rule out other etiologies we will perform a full nutritional panel (a full hepatitis panels already been ordered).  At this time I have low suspicion of a primary hematological process causing these CBC abnormalities.  If there is no concerning abnormalities in our work-up we will have the patient return to clinic on an as-needed basis.  # Thrombocytopenia # Polycythemia -- At this time I do not believe that these 2 conditions are related. --Polycythemia is most likely secondary to the patient's history of OSA.  Encouraged her to recontact with sleep specialist in order to adjust her CPAP machine --Patient does have CT imaging from 08/11/2020 which is consistent with hepatic steatosis. --Liver dysfunction is most likely cause of this patient's low platelet count.  We will be thorough and order nutritional studies.  Patient had a negative hepatitis panel. --Encouraged follow-up with GI and sleep medicine. --No need for routine follow-up in our clinic.  Orders Placed This Encounter  Procedures   CBC with Differential ([place] Only)    Standing Status:   Future    Number of Occurrences:   1    Standing Expiration Date:   08/22/2021   CMP ([place] only)    Standing Status:   Future    Number of Occurrences:   1    Standing Expiration Date:   08/22/2021   Lactate dehydrogenase (LDH)    Standing Status:   Future    Number of Occurrences:   1    Standing Expiration Date:   08/22/2021   Vitamin B12    Standing Status:   Future    Number  of Occurrences:   1    Standing Expiration Date:   08/22/2021   Folate, Serum    Standing Status:   Future    Number of Occurrences:   1    Standing Expiration Date:   08/22/2021   Erythropoietin    Standing Status:   Future    Number of Occurrences:   1    Standing Expiration Date:   08/22/2021   Save Smear (SSMR)    Standing Status:   Future    Number of Occurrences:   1    Standing Expiration Date:   08/22/2021   TSH    Standing Status:   Future    Number of Occurrences:   1    Standing Expiration Date:   08/22/2021    All questions were answered. The patient knows to call the clinic with any problems, questions or concerns.  A total of more than 60 minutes were spent on this encounter with face-to-face time and non-face-to-face time, including preparing to see the patient, ordering tests and/or medications, counseling the patient and  coordination of care as outlined above.   Ledell Peoples, MD Department of Hematology/Oncology [place] at [facility] Phone: [phone] Pager: [phone] Email: [email or web address]  08/31/2020 6:28 PM

## 2020-08-24 LAB — ERYTHROPOIETIN: Erythropoietin: 8.4 m[IU]/mL (ref 2.6–18.5)

## 2020-08-26 DIAGNOSIS — D61818 Other pancytopenia: Secondary | ICD-10-CM | POA: Diagnosis not present

## 2020-08-26 DIAGNOSIS — I10 Essential (primary) hypertension: Secondary | ICD-10-CM | POA: Diagnosis not present

## 2020-08-26 DIAGNOSIS — I471 Supraventricular tachycardia: Secondary | ICD-10-CM | POA: Diagnosis not present

## 2020-08-26 DIAGNOSIS — R7989 Other specified abnormal findings of blood chemistry: Secondary | ICD-10-CM | POA: Diagnosis not present

## 2020-08-28 ENCOUNTER — Telehealth: Payer: Self-pay | Admitting: *Deleted

## 2020-08-28 ENCOUNTER — Telehealth: Payer: Self-pay | Admitting: Gastroenterology

## 2020-08-28 NOTE — Telephone Encounter (Signed)
Lm on vm for patient to return call to schedule a follow up with Dr. Loletha Carrow.

## 2020-08-28 NOTE — Telephone Encounter (Signed)
-----   Message from Orson Slick, MD sent at 08/27/2020  8:09 AM EDT ----- Please let Mrs. Cofrancesco know that her labs have normalized since our last visit. Her Hgb and Plt are back to normal. This was most likely transient. No other lab abnormalities noted. There is no need for routine f/u  ----- Message ----- From: Buel Ream, Lab In [place] Sent: 08/22/2020  12:16 PM EDT To: Orson Slick, MD

## 2020-08-28 NOTE — Telephone Encounter (Signed)
MY CHART message from patient is as follows:  "Was in [facility] Aug 5. They wanted me to see Dr Loletha Carrow concerning  my liver. I was wondering if  they referred or if I was supposed to make my own appointment.  Checking with  referrals. Thank you, Pamela Daniel dob 05/31/1950. Concerning elevated liver enzymes"

## 2020-08-28 NOTE — Telephone Encounter (Signed)
TCT patient regarding recent lab results. No answer.  VM message left for pt to return call to [phone] at her earliest convenience

## 2020-08-28 NOTE — Telephone Encounter (Signed)
Patient returned call, schedulers assisted her with scheduling a follow up with Dr. Loletha Carrow on Tuesday, 10/14/20 at 2:20 PM.

## 2020-08-28 NOTE — Telephone Encounter (Signed)
Difficult to know what the clinical concern is-mild elevation of 1 liver liver labs, recent imaging showing fatty liver.  Hematology clinic note from last week is incomplete.  Please set the patient up with a clinic visit to see me.  I saw her in early 2021

## 2020-08-28 NOTE — Telephone Encounter (Signed)
Dr. Loletha Carrow, please see note from patient below. Please advise, thanks.

## 2020-09-08 DIAGNOSIS — I2510 Atherosclerotic heart disease of native coronary artery without angina pectoris: Secondary | ICD-10-CM | POA: Diagnosis not present

## 2020-09-08 DIAGNOSIS — I6389 Other cerebral infarction: Secondary | ICD-10-CM | POA: Diagnosis not present

## 2020-09-08 DIAGNOSIS — I10 Essential (primary) hypertension: Secondary | ICD-10-CM | POA: Diagnosis not present

## 2020-09-08 DIAGNOSIS — R55 Syncope and collapse: Secondary | ICD-10-CM | POA: Diagnosis not present

## 2020-09-15 DIAGNOSIS — G4733 Obstructive sleep apnea (adult) (pediatric): Secondary | ICD-10-CM | POA: Diagnosis not present

## 2020-09-18 DIAGNOSIS — R55 Syncope and collapse: Secondary | ICD-10-CM | POA: Diagnosis not present

## 2020-09-23 DIAGNOSIS — R55 Syncope and collapse: Secondary | ICD-10-CM | POA: Diagnosis not present

## 2020-09-26 DIAGNOSIS — I10 Essential (primary) hypertension: Secondary | ICD-10-CM | POA: Diagnosis not present

## 2020-10-14 ENCOUNTER — Other Ambulatory Visit (INDEPENDENT_AMBULATORY_CARE_PROVIDER_SITE_OTHER): Payer: Medicare HMO

## 2020-10-14 ENCOUNTER — Encounter: Payer: Self-pay | Admitting: Gastroenterology

## 2020-10-14 ENCOUNTER — Ambulatory Visit: Payer: Medicare HMO | Admitting: Gastroenterology

## 2020-10-14 VITALS — BP 140/70 | HR 85 | Ht 61.0 in | Wt 185.0 lb

## 2020-10-14 DIAGNOSIS — K760 Fatty (change of) liver, not elsewhere classified: Secondary | ICD-10-CM | POA: Diagnosis not present

## 2020-10-14 DIAGNOSIS — R7989 Other specified abnormal findings of blood chemistry: Secondary | ICD-10-CM

## 2020-10-14 LAB — HEPATIC FUNCTION PANEL
ALT: 16 U/L (ref 0–35)
AST: 21 U/L (ref 0–37)
Albumin: 3.9 g/dL (ref 3.5–5.2)
Alkaline Phosphatase: 59 U/L (ref 39–117)
Bilirubin, Direct: 0.1 mg/dL (ref 0.0–0.3)
Total Bilirubin: 0.5 mg/dL (ref 0.2–1.2)
Total Protein: 7 g/dL (ref 6.0–8.3)

## 2020-10-14 LAB — CBC WITH DIFFERENTIAL/PLATELET
Basophils Absolute: 0 10*3/uL (ref 0.0–0.1)
Basophils Relative: 1 % (ref 0.0–3.0)
Eosinophils Absolute: 0.2 10*3/uL (ref 0.0–0.7)
Eosinophils Relative: 4.4 % (ref 0.0–5.0)
HCT: 43.1 % (ref 36.0–46.0)
Hemoglobin: 14.6 g/dL (ref 12.0–15.0)
Lymphocytes Relative: 42.5 % (ref 12.0–46.0)
Lymphs Abs: 2.1 10*3/uL (ref 0.7–4.0)
MCHC: 33.8 g/dL (ref 30.0–36.0)
MCV: 92.6 fl (ref 78.0–100.0)
Monocytes Absolute: 0.6 10*3/uL (ref 0.1–1.0)
Monocytes Relative: 12.1 % — ABNORMAL HIGH (ref 3.0–12.0)
Neutro Abs: 2 10*3/uL (ref 1.4–7.7)
Neutrophils Relative %: 40 % — ABNORMAL LOW (ref 43.0–77.0)
Platelets: 225 10*3/uL (ref 150.0–400.0)
RBC: 4.66 Mil/uL (ref 3.87–5.11)
RDW: 15.1 % (ref 11.5–15.5)
WBC: 5 10*3/uL (ref 4.0–10.5)

## 2020-10-14 NOTE — Patient Instructions (Addendum)
If you are age 71 or older, your body mass index should be between 23-30. Your Body mass index is 34.96 kg/m. If this is out of the aforementioned range listed, please consider follow up with your Primary Care Provider.  If you are age 29 or younger, your body mass index should be between 19-25. Your Body mass index is 34.96 kg/m. If this is out of the aformentioned range listed, please consider follow up with your Primary Care Provider.   __________________________________________________________  The [place] [facility] providers would like to encourage you to use [facility] to communicate with providers for non-urgent requests or questions.  Due to long hold times on the telephone, sending your provider a message by [facility] may be a faster and more efficient way to get a response.  Please allow 48 business hours for a response.  Please remember that this is for non-urgent requests.   Please go to the lab in the basement of our building to have lab work done as you leave today. Hit "B" for basement when you get on the elevator.  When the doors open the lab is on your left.  We will call you with the results. Thank you.  It was a pleasure to see you today!  Thank you for trusting me with your gastrointestinal care!

## 2020-10-14 NOTE — Progress Notes (Signed)
[facility] Progress Note  Chief Complaint: Transaminitis/elevated LFTs  Subjective  History: I last saw Pamela Daniel for a colonoscopy in February 2021.  She had been experiencing abdominal bloating and flatulence and was also due for screening due to family history of colon cancer.  6 subcentimeter polyps removed, pathology showed they were adenomatous and serrated.  3-year recall recommended.  Pamela Daniel was referred back by primary care for elevated LFTs in the setting of other systemic symptoms occurring over the summer.  She was in the ED on July 12 with an acute respiratory illness, no PE found.  Elevated LFTs of unclear cause.  She also described profound fatigue, fevers to 101 and leg pain. She was in the ED again on July 25 with acute onset of abdominal pain nausea and vomiting. Lashana says she cannot get a clear explanation of what was causing all of the symptoms.  Her primary care physician was concerned about the elevated LFTs and other blood count abnormalities and concern she may have cancer. Hematology visit with Dr. Lorenso Courier on 08/22/2020 (note reviewed): Polycythemia felt likely secondary to patient's sleep apnea.  Thrombocytopenia suspected possible to be from liver condition.  It was only in the last couple of weeks that [name] began feeling better, she says her symptoms have abated at this point and she is feeling well.  She was still concerned about what many of the test results meant, whether or not she had hepatitis and what she should do about fatty liver.  ROS: Cardiovascular:  no chest pain Respiratory: no dyspnea Fatigue Anxiety Remainder of systems negative except as above The patient's Past Medical, Family and Social History were reviewed and are on file in the EMR. Past Medical History:  Diagnosis Date   Anxiety    Arthritis    SPINE   Asthma    Blood transfusion without reported diagnosis    Fatty liver    Fatty pancreas    First degree heart block     Frequency of urination    GERD (gastroesophageal reflux disease)    Hemorrhoids    Hepatic steatosis    Hiatal hernia    POST RESIDUAL SMALL HH PER IMAGING   History of acute pancreatitis 03/16/2015   History of adenomatous polyp of colon    tubular adenoma's 04-08-2014/   hyperplastic bening polyp 2000   History of concussion 2012   no residual   History of transient ischemic attack (TIA) 12/22/2011   no residual   History of vaginal dysplasia 2016   Hypertension    Pancreatic cyst    Pseudocyst of pancreas    PSVT (paroxysmal supraventricular tachycardia) ([place])    cardioloigst-  dr Einar Gip--- last visit 2013 per pt and is currently followed by pcp   Rectal bleeding    S/P AV (atrioventricular) nodal ablation 01-21-2010    dr Caryl Comes   Simple renal cyst    bilateral per imaging   Urgency of urination    Urinary frequency    Urinary leakage    Past Surgical History:  Procedure Laterality Date   BREAST EXCISIONAL BIOPSY Right    CARDIAC ELECTROPHYSIOLOGY MAPPING AND ABLATION  01-21-2010   dr Caryl Comes   ablation AVNRT   FOOT NEUROMA SURGERY Left 1996   HEMORRHOID SURGERY N/A 10/28/2016   Procedure: HEMORRHOIDECTOMY AND HEMORRHOIDAL PEXY;  Surgeon: Leighton Ruff, MD;  Location: [place];  Service: General;  Laterality: N/A;   LAPAROSCOPY TAKEDOWN AND REPAIR HIATAL HERNIA /  NISSEN  FUNDOPLATION  09-01-2005    dr Hassell Done  [facility]   LEFT HEART CATH AND CORONARY ANGIOGRAPHY N/A 01/17/2020   Procedure: LEFT HEART CATH AND CORONARY ANGIOGRAPHY;  Surgeon: Dionisio David, MD;  Location: [place] [facility];  Service: Cardiovascular;  Laterality: N/A;   NASAL SEPTUM SURGERY  1991   TEE WITHOUT CARDIOVERSION  02/15/2012   Procedure: TRANSESOPHAGEAL ECHOCARDIOGRAM (TEE);  Surgeon: Laverda Page, MD;  Location: [place];  Service: Cardiovascular;  Laterality: N/A;  normal LV, normal EF, mild MR, trace TR, trace PI   TRANSTHORACIC ECHOCARDIOGRAM  12-22-2011    dr Einar Gip    normal echo   [place]   w/  BSO    Objective:  Med list reviewed  Current Outpatient Medications:    albuterol (PROVENTIL) (2.5 MG/3ML) 0.083% nebulizer solution, Take 3 mLs (2.5 mg total) by nebulization every 6 (six) hours as needed for wheezing or shortness of breath., Disp: 75 mL, Rfl: 0   ALPRAZolam (XANAX) 0.5 MG tablet, Take 0.5 mg by mouth daily as needed., Disp: , Rfl:    aspirin 325 MG tablet, Take 325 mg by mouth daily., Disp: , Rfl:    budesonide-formoterol (SYMBICORT) 160-4.5 MCG/ACT inhaler, Inhale 2 puffs into the lungs daily as needed (Shortness of breath)., Disp: , Rfl:    Cholecalciferol (VITAMIN D3) 1.25 MG (50000 UT) CAPS, 1 capsule, Disp: , Rfl:    dicyclomine (BENTYL) 20 MG tablet, Take 1 tablet (20 mg total) by mouth 2 (two) times daily., Disp: 20 tablet, Rfl: 0   fluticasone (FLONASE) 50 MCG/ACT nasal spray, 1 spray in each nostril, Disp: , Rfl:    ibuprofen (ADVIL,MOTRIN) 200 MG tablet, Take 800 mg by mouth every 8 (eight) hours as needed for mild pain., Disp: , Rfl:    nitroGLYCERIN (NITROSTAT) 0.4 MG SL tablet, Place 0.4 mg under the tongue every 5 (five) minutes as needed for chest pain., Disp: , Rfl:    ondansetron (ZOFRAN ODT) 4 MG disintegrating tablet, 4mg  ODT q4 hours prn nausea/vomit, Disp: 10 tablet, Rfl: 0   pantoprazole (PROTONIX) 40 MG tablet, , Disp: , Rfl:    rosuvastatin (CRESTOR) 20 MG tablet, Take 20 mg by mouth at bedtime., Disp: , Rfl:    spironolactone (ALDACTONE) 25 MG tablet, Take 25 mg by mouth daily., Disp: , Rfl:  No current facility-administered medications for this visit.  Facility-Administered Medications Ordered in Other Visits:    sodium chloride flush (NS) 0.9 % injection 3 mL, 3 mL, Intravenous, Q12H, Dionisio David, MD   Vital signs in last 24 hrs: Vitals:   10/14/20 1412  BP: 140/70  Pulse: 85   Wt Readings from Last 3 Encounters:  10/14/20 185 lb (83.9 kg)  08/22/20 180 lb 1.6 oz (81.7 kg)  07/29/20  182 lb (82.6 kg)    Physical Exam Her sister was present for the entire visit Well-appearing HEENT: sclera anicteric, oral mucosa moist without lesions Neck: supple, no thyromegaly, JVD or lymphadenopathy Cardiac: RRR without murmurs, S1S2 heard, no peripheral edema Pulm: clear to auscultation bilaterally, normal RR and effort noted Abdomen: soft, obese, no tenderness, with active bowel sounds. No guarding or palpable hepatosplenomegaly, limited by body habitus Skin; warm and dry, no jaundice or rash.  No bruising, petechiae or spider nevi Neuro: Normal mentation normal gross motor function, steady gait and fluent speech Labs:  CBC Latest Ref Rng & Units 08/22/2020 08/11/2020 07/29/2020  WBC 4.0 - 10.5 K/uL 5.8 10.7(H) 3.2(L)  Hemoglobin 12.0 -  15.0 g/dL 13.7 16.1(H) 15.2(H)  Hematocrit 36.0 - 46.0 % 41.1 48.8(H) 44.5  Platelets 150 - 400 K/uL 152 138(L) 142(L)   CMP Latest Ref Rng & Units 08/22/2020 08/11/2020 07/29/2020  Glucose 70 - 99 mg/dL 122(H) 147(H) 165(H)  BUN 8 - 23 mg/dL 10 11 11   Creatinine 0.44 - 1.00 mg/dL 0.70 0.88 0.71  Sodium 135 - 145 mmol/L 138 133(L) 136  Potassium 3.5 - 5.1 mmol/L 4.0 4.4 3.8  Chloride 98 - 111 mmol/L 105 97(L) 104  CO2 22 - 32 mmol/L 25 27 25   Calcium 8.9 - 10.3 mg/dL 9.0 9.0 8.8(L)  Total Protein 6.5 - 8.1 g/dL 6.7 6.9 6.8  Total Bilirubin 0.3 - 1.2 mg/dL 0.6 0.7 0.7  Alkaline Phos 38 - 126 U/L 86 99 68  AST 15 - 41 U/L 43(H) 137(H) 126(H)  ALT 0 - 44 U/L 33 134(H) 97(H)   LFTs April 2020 and September 2019 normal  Negative acute viral hepatitis panel 08/11/2020  Normal folate and B12  ___________________________________________ Radiologic studies: CLINICAL DATA:  70 year old female with elevated liver enzymes.   EXAM: ULTRASOUND ABDOMEN LIMITED RIGHT UPPER QUADRANT   COMPARISON:  01/24/2019, 07/29/2020   FINDINGS: Gallbladder:   No gallstones, pericholecystic fluid, or gallbladder wall thickening visualized. No sonographic Murphy  sign noted by sonographer.   Common bile duct:   Diameter: 3 mm   Liver:   Limited evaluation of the hepatic parenchyma. Diffusely increased echogenicity, difficult to quantify. No definite evidence of hepatoma. Portal vein is patent on color Doppler imaging with normal direction of blood flow towards the liver.   Other: No perihepatic ascites.   IMPRESSION: Limited sonographic evaluation of the hepatic parenchyma, though suggestive of hepatic steatosis.   Ruthann Cancer, MD   [facility]   [facility]     Electronically Signed   By: Ruthann Cancer MD   On: 08/08/2020 14:10   _----------____________________________  February 2017 abdominal ultrasound also showing increased hepatic echogenicity   __________________________________________   CLINICAL DATA:  Abdominal pain, acute, nonlocalized   EXAM: CT ABDOMEN AND PELVIS WITH CONTRAST   TECHNIQUE: Multidetector CT imaging of the abdomen and pelvis was performed using the standard protocol following bolus administration of intravenous contrast.   CONTRAST:  147mL OMNIPAQUE IOHEXOL 300 MG/ML  SOLN   COMPARISON:  CT abdomen pelvis 01/24/2019   FINDINGS: Lower chest: No acute abnormality.   Hepatobiliary: Hepatic steatosis. No gallstones, gallbladder wall thickening, or biliary dilatation.   Pancreas: Fatty atrophy, unchanged from prior exam. No ductal dilation.   Spleen: Normal in size without focal abnormality.   Adrenals/Urinary Tract: Adrenal glands are unremarkable. No hydronephrosis. No nephrolithiasis. There are small bilateral renal cysts. Bladder is minimally distended.   Stomach/Bowel: Postsurgical changes near the gastroesophageal junction. The stomach is within normal limits otherwise. There is no evidence of bowel obstruction. The appendix is normal. The large bowel is unremarkable.   Vascular/Lymphatic: No significant vascular findings are  present. No enlarged abdominal or pelvic lymph nodes.   Reproductive: Prior hysterectomy.   Other: No abdominal wall hernia or abnormality. No abdominopelvic ascites.   Musculoskeletal: No acute osseous abnormality. Sacralized L5 with grade 1 anterolisthesis at L4-L5. Bilateral facet arthropathy at L4-L5.   IMPRESSION: No acute abdominopelvic abnormality.  Normal appendix.   Hepatic steatosis.     Electronically Signed   By: Maurine Simmering   On: 08/11/2020 20:13   ____________________________________________ Other: Normal erythropoietin level  _____________________________________________ Assessment & Plan  Assessment: Encounter Diagnoses  Name Primary?   Elevated LFTs Yes   Nonalcoholic fatty liver disease    Keeli had an AST predominant transaminitis and hematologic abnormalities in the setting of what sounds like a prolonged systemic illness of some kind. She has not had a clear diagnosis, but thankfully her symptoms have almost entirely resolved except for some fatigue, and her lab abnormalities have also significantly improved. I doubt this was all from a primary underlying liver condition.  We discussed the ultrasound finding of probable fatty liver.  It is less likely that other chronic liver condition such as autoimmune liver disease may cause this.  She was negative for chronic viral hepatitis, which was a relief to her.  Plan: CBC, hepatic function panel, ANA, alpha-1 antitrypsin, ASMA, AMA  Cut down on starch, cut out concentrated sweets for improvement of fatty liver.  She does not have radiologic evidence of portal hypertension and splenomegaly, so it seems unlikely her thrombocytopenia was due to underlying liver disease.  Follow-up if any to be determined after lab results.  32 minutes were spent on this encounter (including chart review, history/exam, counseling/coordination of care, and documentation) > 50% of that time was spent on counseling and  coordination of care.   Nelida Meuse III

## 2020-10-16 DIAGNOSIS — I2510 Atherosclerotic heart disease of native coronary artery without angina pectoris: Secondary | ICD-10-CM | POA: Diagnosis not present

## 2020-10-16 DIAGNOSIS — R0602 Shortness of breath: Secondary | ICD-10-CM | POA: Diagnosis not present

## 2020-10-16 DIAGNOSIS — I4891 Unspecified atrial fibrillation: Secondary | ICD-10-CM | POA: Diagnosis not present

## 2020-10-16 DIAGNOSIS — I10 Essential (primary) hypertension: Secondary | ICD-10-CM | POA: Diagnosis not present

## 2020-10-16 LAB — ALPHA-1-ANTITRYPSIN: A-1 Antitrypsin, Ser: 172 mg/dL (ref 83–199)

## 2020-10-16 LAB — ANA: Anti Nuclear Antibody (ANA): NEGATIVE

## 2020-10-16 LAB — ANTI-SMOOTH MUSCLE ANTIBODY, IGG: Actin (Smooth Muscle) Antibody (IGG): <20 U (ref <20)

## 2020-10-16 LAB — MITOCHONDRIAL ANTIBODIES: Mitochondrial M2 Ab, IgG: <=20 U (ref <=20.0)

## 2020-10-31 DIAGNOSIS — R9439 Abnormal result of other cardiovascular function study: Secondary | ICD-10-CM | POA: Diagnosis not present

## 2020-11-04 DIAGNOSIS — I340 Nonrheumatic mitral (valve) insufficiency: Secondary | ICD-10-CM | POA: Diagnosis not present

## 2020-11-04 DIAGNOSIS — I4891 Unspecified atrial fibrillation: Secondary | ICD-10-CM | POA: Diagnosis not present

## 2020-11-04 DIAGNOSIS — I519 Heart disease, unspecified: Secondary | ICD-10-CM | POA: Diagnosis not present

## 2020-11-04 DIAGNOSIS — E669 Obesity, unspecified: Secondary | ICD-10-CM | POA: Diagnosis not present

## 2020-11-04 DIAGNOSIS — I10 Essential (primary) hypertension: Secondary | ICD-10-CM | POA: Diagnosis not present

## 2020-12-08 DIAGNOSIS — R0602 Shortness of breath: Secondary | ICD-10-CM | POA: Diagnosis not present

## 2020-12-08 DIAGNOSIS — I10 Essential (primary) hypertension: Secondary | ICD-10-CM | POA: Diagnosis not present

## 2020-12-08 DIAGNOSIS — I509 Heart failure, unspecified: Secondary | ICD-10-CM | POA: Diagnosis not present

## 2020-12-08 DIAGNOSIS — I340 Nonrheumatic mitral (valve) insufficiency: Secondary | ICD-10-CM | POA: Diagnosis not present

## 2020-12-08 DIAGNOSIS — I2510 Atherosclerotic heart disease of native coronary artery without angina pectoris: Secondary | ICD-10-CM | POA: Diagnosis not present

## 2020-12-08 DIAGNOSIS — I4891 Unspecified atrial fibrillation: Secondary | ICD-10-CM | POA: Diagnosis not present

## 2020-12-19 DIAGNOSIS — M5442 Lumbago with sciatica, left side: Secondary | ICD-10-CM | POA: Diagnosis not present

## 2020-12-19 DIAGNOSIS — R7989 Other specified abnormal findings of blood chemistry: Secondary | ICD-10-CM | POA: Diagnosis not present

## 2020-12-19 DIAGNOSIS — G894 Chronic pain syndrome: Secondary | ICD-10-CM | POA: Diagnosis not present

## 2020-12-19 DIAGNOSIS — M4306 Spondylolysis, lumbar region: Secondary | ICD-10-CM | POA: Diagnosis not present

## 2020-12-19 DIAGNOSIS — I10 Essential (primary) hypertension: Secondary | ICD-10-CM | POA: Diagnosis not present

## 2020-12-19 DIAGNOSIS — M5441 Lumbago with sciatica, right side: Secondary | ICD-10-CM | POA: Diagnosis not present

## 2020-12-19 DIAGNOSIS — G8929 Other chronic pain: Secondary | ICD-10-CM | POA: Diagnosis not present

## 2020-12-19 DIAGNOSIS — I480 Paroxysmal atrial fibrillation: Secondary | ICD-10-CM | POA: Diagnosis not present

## 2020-12-24 ENCOUNTER — Other Ambulatory Visit: Payer: Self-pay | Admitting: Internal Medicine

## 2020-12-24 DIAGNOSIS — M545 Low back pain, unspecified: Secondary | ICD-10-CM

## 2020-12-26 ENCOUNTER — Other Ambulatory Visit: Payer: Self-pay

## 2020-12-26 ENCOUNTER — Ambulatory Visit
Admission: RE | Admit: 2020-12-26 | Discharge: 2020-12-26 | Disposition: A | Discharge status: Home / Self Care | Payer: Medicare HMO | Source: Ambulatory Visit | Attending: Internal Medicine | Admitting: Internal Medicine

## 2020-12-26 DIAGNOSIS — M48061 Spinal stenosis, lumbar region without neurogenic claudication: Secondary | ICD-10-CM | POA: Diagnosis not present

## 2020-12-26 DIAGNOSIS — M4316 Spondylolisthesis, lumbar region: Secondary | ICD-10-CM | POA: Diagnosis not present

## 2020-12-26 DIAGNOSIS — M545 Low back pain, unspecified: Secondary | ICD-10-CM

## 2020-12-26 DIAGNOSIS — R200 Anesthesia of skin: Secondary | ICD-10-CM | POA: Diagnosis not present

## 2021-01-05 DIAGNOSIS — M4316 Spondylolisthesis, lumbar region: Secondary | ICD-10-CM | POA: Diagnosis not present

## 2021-01-05 DIAGNOSIS — M545 Low back pain, unspecified: Secondary | ICD-10-CM | POA: Diagnosis not present

## 2021-01-05 DIAGNOSIS — I10 Essential (primary) hypertension: Secondary | ICD-10-CM | POA: Diagnosis not present

## 2021-01-05 DIAGNOSIS — M47816 Spondylosis without myelopathy or radiculopathy, lumbar region: Secondary | ICD-10-CM | POA: Diagnosis not present

## 2021-01-05 DIAGNOSIS — Z6835 Body mass index (BMI) 35.0-35.9, adult: Secondary | ICD-10-CM | POA: Diagnosis not present

## 2021-01-14 ENCOUNTER — Other Ambulatory Visit: Payer: Self-pay | Admitting: Neurosurgery

## 2021-01-16 DIAGNOSIS — I480 Paroxysmal atrial fibrillation: Secondary | ICD-10-CM | POA: Diagnosis not present

## 2021-01-16 DIAGNOSIS — M8580 Other specified disorders of bone density and structure, unspecified site: Secondary | ICD-10-CM | POA: Diagnosis not present

## 2021-01-16 DIAGNOSIS — I10 Essential (primary) hypertension: Secondary | ICD-10-CM | POA: Diagnosis not present

## 2021-01-20 ENCOUNTER — Other Ambulatory Visit: Payer: Self-pay | Admitting: Neurosurgery

## 2021-01-28 DIAGNOSIS — J101 Influenza due to other identified influenza virus with other respiratory manifestations: Secondary | ICD-10-CM | POA: Diagnosis not present

## 2021-01-28 DIAGNOSIS — H9209 Otalgia, unspecified ear: Secondary | ICD-10-CM | POA: Diagnosis not present

## 2021-01-28 DIAGNOSIS — R059 Cough, unspecified: Secondary | ICD-10-CM | POA: Diagnosis not present

## 2021-01-28 DIAGNOSIS — R0981 Nasal congestion: Secondary | ICD-10-CM | POA: Diagnosis not present

## 2021-01-28 DIAGNOSIS — Z20822 Contact with and (suspected) exposure to covid-19: Secondary | ICD-10-CM | POA: Diagnosis not present

## 2021-01-28 DIAGNOSIS — R110 Nausea: Secondary | ICD-10-CM | POA: Diagnosis not present

## 2021-01-29 NOTE — Pre-Procedure Instructions (Signed)
Surgical Instructions    Your procedure is scheduled on Tuesday, February 03, 2021 at 7:30 AM.  Report to [facility] Main Entrance "A" at 5:30 A.M., then check in with the Admitting office.  Call this number if you have problems the morning of surgery:  [phone]   If you have any questions prior to your surgery date call [phone]: Open Monday-Friday 8am-4pm    Remember:  Do not eat after midnight the night before your surgery  You may drink clear liquids until 4:30 AM the morning of your surgery.   Clear liquids allowed are: Water, Non-Citrus Juices (without pulp), Carbonated Beverages, Clear Tea, Black Coffee Only, and Gatorade    Take these medicines the morning of surgery with A SIP OF WATER:  fluticasone (FLONASE) pantoprazole (PROTONIX)  sotalol (BETAPACE)  IF NEEDED: acetaminophen (TYLENOL)  albuterol (PROVENTIL) ALPRAZolam (XANAX) budesonide-formoterol (SYMBICORT) - Bring with you day of surgery. nitroGLYCERIN (NITROSTAT)  Follow your surgeon's instructions on when to stop apixaban (ELIQUIS).  If no instructions were given by your surgeon then you will need to call the office to get those instructions.     As of today, STOP taking any Aspirin (unless otherwise instructed by your surgeon) Aleve, Naproxen, Ibuprofen, Motrin, Advil, Goody's, BC's, all herbal medications, fish oil, and all vitamins.                     Do NOT Smoke (Tobacco/Vaping) or drink Alcohol 24 hours prior to your procedure.  If you use a CPAP at night, you may bring all equipment for your overnight stay.   Contacts, glasses, piercing's, hearing aid's, dentures or partials may not be worn into surgery, please bring cases for these belongings.    For patients admitted to the hospital, discharge time will be determined by your treatment team.   Patients discharged the day of surgery will not be allowed to drive home, and someone needs to stay with them for 24 hours.  NO VISITORS WILL BE  ALLOWED IN PRE-OP WHERE PATIENTS GET READY FOR SURGERY.  ONLY 1 SUPPORT PERSON MAY BE PRESENT IN THE WAITING ROOM WHILE YOU ARE IN SURGERY.  IF YOU ARE TO BE ADMITTED, ONCE YOU ARE IN YOUR ROOM YOU WILL BE ALLOWED TWO (2) VISITORS.  Minor children may have two parents present. Special consideration for safety and communication needs will be reviewed on a case by case basis.   Special instructions:   [place]- Preparing For Surgery  Before surgery, you can play an important role. Because skin is not sterile, your skin needs to be as free of germs as possible. You can reduce the number of germs on your skin by washing with CHG (chlorahexidine gluconate) Soap before surgery.  CHG is an antiseptic cleaner which kills germs and bonds with the skin to continue killing germs even after washing.    Oral Hygiene is also important to reduce your risk of infection.  Remember - BRUSH YOUR TEETH THE MORNING OF SURGERY WITH YOUR REGULAR TOOTHPASTE  Please do not use if you have an allergy to CHG or antibacterial soaps. If your skin becomes reddened/irritated stop using the CHG.  Do not shave (including legs and underarms) for at least 48 hours prior to first CHG shower. It is OK to shave your face.  Please follow these instructions carefully.   Shower the NIGHT BEFORE SURGERY and the MORNING OF SURGERY  If you chose to wash your hair, wash your hair first as usual with  your normal shampoo.  After you shampoo, rinse your hair and body thoroughly to remove the shampoo.  Use CHG Soap as you would any other liquid soap. You can apply CHG directly to the skin and wash gently with a scrungie or a clean washcloth.   Apply the CHG Soap to your body ONLY FROM THE NECK DOWN.  Do not use on open wounds or open sores. Avoid contact with your eyes, ears, mouth and genitals (private parts). Wash Face and genitals (private parts)  with your normal soap.   Wash thoroughly, paying special attention to the area where  your surgery will be performed.  Thoroughly rinse your body with warm water from the neck down.  DO NOT shower/wash with your normal soap after using and rinsing off the CHG Soap.  Pat yourself dry with a CLEAN TOWEL.  Wear CLEAN PAJAMAS to bed the night before surgery  Place CLEAN SHEETS on your bed the night before your surgery  DO NOT SLEEP WITH PETS.   Day of Surgery: Shower with CHG soap. Do not wear jewelry, make up, nail polish, gel polish, artificial nails, or any other type of covering on natural nails including finger and toenails. If patients have artificial nails, gel coating, etc. that need to be removed by a nail salon please have this removed prior to surgery. Surgery may need to be canceled/delayed if the surgeon/ anesthesia feels like the patient is unable to be adequately monitored. Do not wear lotions, powders, perfumes, or deodorant. Do not shave 48 hours prior to surgery. Do not bring valuables to the hospital. [facility] is not responsible for any belongings or valuables. Wear Clean/Comfortable clothing the morning of surgery Remember to brush your teeth WITH YOUR REGULAR TOOTHPASTE.   Please read over the following fact sheets that you were given.   3 days prior to your procedure or After your COVID test   You are not required to quarantine however you are required to wear a well-fitting mask when you are out and around people not in your household. If your mask becomes wet or soiled, replace with a new one.   Wash your hands often with soap and water for 20 seconds or clean your hands with an alcohol-based hand sanitizer that contains at least 60% alcohol.   Do not share personal items.   Notify your provider:  o if you are in close contact with someone who has COVID  o or if you develop a fever of 100.4 or greater, sneezing, cough, sore throat, shortness of breath or body aches.

## 2021-01-30 ENCOUNTER — Encounter (HOSPITAL_COMMUNITY)
Admission: RE | Admit: 2021-01-30 | Discharge: 2021-01-30 | Disposition: A | Discharge status: Home / Self Care | Payer: Medicare HMO | Source: Ambulatory Visit | Attending: Neurosurgery | Admitting: Neurosurgery

## 2021-01-30 ENCOUNTER — Other Ambulatory Visit: Payer: Self-pay

## 2021-01-30 NOTE — Progress Notes (Signed)
Patient arrived for 1000 PAT appt today and made [place] aware that she tested positive for the flu Wednesday 01/28/21. Called Dr. Cleotilde Neer office and spoke with [facility] to make them aware. Patient sent home and instructed to also call surgeon's office.

## 2021-02-05 NOTE — Pre-Procedure Instructions (Signed)
Surgical Instructions                 Your procedure is scheduled on Tuesday, February 10, 2021 at 10:00 AM.             Report to [facility] Main Entrance "A" at 8:00 A.M., then check in with the Admitting office.             Call this number if you have problems the morning of surgery:             [phone]    If you have any questions prior to your surgery date call [phone]: Open Monday-Friday 8am-4pm                 Remember:             Do not eat after midnight the night before your surgery   You may drink clear liquids until 4:30 AM the morning of your surgery.   Clear liquids allowed are: Water, Non-Citrus Juices (without pulp), Carbonated Beverages, Clear Tea, Black Coffee Only (NO MILK, POWDERED/LIQUID CREAMERS OF ANY KIND), and Gatorade.                          Take these medicines the morning of surgery with A SIP OF WATER:   fluticasone (FLONASE) pantoprazole (PROTONIX)  sotalol (BETAPACE)   IF NEEDED: acetaminophen (TYLENOL)  albuterol (PROVENTIL) nebulizer ALPRAZolam (XANAX) budesonide-formoterol (SYMBICORT) - Bring with you day of surgery. nitroGLYCERIN (NITROSTAT)-please let a nurse know if you had to use this.    Per Dr. Humphrey Rolls, hold apixaban Arne Cleveland) 3 days prior to your procedure. Last dose should be Friday, 02/06/21.    As of today, STOP taking any Aspirin (unless otherwise instructed by your surgeon) Aleve, Naproxen, Ibuprofen, Motrin, Advil, Goody's, BC's, all herbal medications, fish oil, and all vitamins.                     Do NOT Smoke (Tobacco/Vaping) for 24 hours prior to your procedure.   If you use a CPAP at night, you may bring all equipment for your overnight stay.   Contacts, glasses, piercing's, hearing aid's, dentures or partials may not be worn into surgery, please bring cases for these belongings.    For patients admitted to the hospital, discharge time will be determined by your treatment team.   Patients discharged the day of  surgery will not be allowed to drive home, and someone needs to stay with them for 24 hours.   NO VISITORS WILL BE ALLOWED IN PRE-OP WHERE PATIENTS GET READY FOR SURGERY.  ONLY 1 SUPPORT PERSON MAY BE PRESENT IN THE WAITING ROOM WHILE YOU ARE IN SURGERY.  IF YOU ARE TO BE ADMITTED, ONCE YOU ARE IN YOUR ROOM YOU WILL BE ALLOWED TWO (2) VISITORS.  (1) VISITOR MAY STAY THE NIGHT BUT MUST BE IN THE ROOM BY 8PM. Minor children may have two parents present. Special consideration for safety and communication needs will be reviewed on a case by case basis.     Special instructions:   [place]- Preparing For Surgery   Before surgery, you can play an important role. Because skin is not sterile, your skin needs to be as free of germs as possible. You can reduce the number of germs on your skin by washing with CHG (chlorahexidine gluconate) Soap before surgery.  CHG is an antiseptic cleaner which kills germs and bonds with  the skin to continue killing germs even after washing.     Oral Hygiene is also important to reduce your risk of infection.  Remember - BRUSH YOUR TEETH THE MORNING OF SURGERY WITH YOUR REGULAR TOOTHPASTE   Please do not use if you have an allergy to CHG or antibacterial soaps. If your skin becomes reddened/irritated stop using the CHG.  Do not shave (including legs and underarms) for at least 48 hours prior to first CHG shower. It is OK to shave your face.   Please follow these instructions carefully.                                                                                                                               Shower the NIGHT BEFORE SURGERY and the MORNING OF SURGERY   If you chose to wash your hair, wash your hair first as usual with your normal shampoo.   After you shampoo, rinse your hair and body thoroughly to remove the shampoo.   Use CHG Soap as you would any other liquid soap. You can apply CHG directly to the skin and wash gently with a scrungie or a clean  washcloth.    Apply the CHG Soap to your body ONLY FROM THE NECK DOWN.  Do not use on open wounds or open sores. Avoid contact with your eyes, ears, mouth and genitals (private parts). Wash Face and genitals (private parts)  with your normal soap.    Wash thoroughly, paying special attention to the area where your surgery will be performed.   Thoroughly rinse your body with warm water from the neck down.   DO NOT shower/wash with your normal soap after using and rinsing off the CHG Soap.   Pat yourself dry with a CLEAN TOWEL.   Wear CLEAN PAJAMAS to bed the night before surgery   Place CLEAN SHEETS on your bed the night before your surgery   DO NOT SLEEP WITH PETS.     Day of Surgery: Shower with CHG soap. Do not wear jewelry, make up, nail polish, gel polish, artificial nails, or any other type of covering on natural nails including finger and toenails. If patients have artificial nails, gel coating, etc. that need to be removed by a nail salon please have this removed prior to surgery. Surgery may need to be canceled/delayed if the surgeon/ anesthesia feels like the patient is unable to be adequately monitored. Do not wear lotions, powders, perfumes, or deodorant. Do not shave 48 hours prior to surgery. Do not bring valuables to the hospital. [facility] is not responsible for any belongings or valuables. Wear Clean/Comfortable clothing the morning of surgery Remember to brush your teeth WITH YOUR REGULAR TOOTHPASTE.   Please read over the following fact sheets that you were given.     3 days prior to your procedure or After your COVID test    You are not required to quarantine however you are required  to wear a well-fitting mask when you are out and around people not in your household. If your mask becomes wet or soiled, replace with a new one.    Wash your hands often with soap and water for 20 seconds or clean your hands with an alcohol-based hand sanitizer that contains  at least 60% alcohol.    Do not share personal items.    Notify your provider:   o if you are in close contact with someone who has COVID   o or if you develop a fever of 100.4 or greater, sneezing, cough, sore throat, shortness of breath or body aches.

## 2021-02-06 ENCOUNTER — Encounter (HOSPITAL_COMMUNITY)
Admission: RE | Admit: 2021-02-06 | Discharge: 2021-02-06 | Disposition: A | Discharge status: Home / Self Care | Payer: Medicare HMO | Source: Ambulatory Visit | Attending: Neurosurgery | Admitting: Neurosurgery

## 2021-02-06 ENCOUNTER — Encounter (HOSPITAL_COMMUNITY): Payer: Self-pay

## 2021-02-06 ENCOUNTER — Other Ambulatory Visit: Payer: Self-pay

## 2021-02-06 VITALS — BP 110/56 | HR 60 | Temp 98.3°F | Resp 18 | Ht 61.0 in | Wt 188.1 lb

## 2021-02-06 DIAGNOSIS — K760 Fatty (change of) liver, not elsewhere classified: Secondary | ICD-10-CM | POA: Diagnosis not present

## 2021-02-06 DIAGNOSIS — I4891 Unspecified atrial fibrillation: Secondary | ICD-10-CM | POA: Diagnosis not present

## 2021-02-06 DIAGNOSIS — Z20822 Contact with and (suspected) exposure to covid-19: Secondary | ICD-10-CM | POA: Insufficient documentation

## 2021-02-06 DIAGNOSIS — Z01818 Encounter for other preprocedural examination: Secondary | ICD-10-CM

## 2021-02-06 DIAGNOSIS — K769 Liver disease, unspecified: Secondary | ICD-10-CM

## 2021-02-06 DIAGNOSIS — Z7901 Long term (current) use of anticoagulants: Secondary | ICD-10-CM | POA: Diagnosis not present

## 2021-02-06 DIAGNOSIS — Z01812 Encounter for preprocedural laboratory examination: Secondary | ICD-10-CM | POA: Insufficient documentation

## 2021-02-06 DIAGNOSIS — G4733 Obstructive sleep apnea (adult) (pediatric): Secondary | ICD-10-CM | POA: Insufficient documentation

## 2021-02-06 HISTORY — DX: Personal history of urinary calculi: Z87.442

## 2021-02-06 LAB — SURGICAL PCR SCREEN
MRSA, PCR: NEGATIVE
Staphylococcus aureus: NEGATIVE

## 2021-02-06 LAB — CBC
HCT: 42.1 % (ref 36.0–46.0)
Hemoglobin: 14.1 g/dL (ref 12.0–15.0)
MCH: 31.4 pg (ref 26.0–34.0)
MCHC: 33.5 g/dL (ref 30.0–36.0)
MCV: 93.8 fL (ref 80.0–100.0)
Platelets: 201 10*3/uL (ref 150–400)
RBC: 4.49 MIL/uL (ref 3.87–5.11)
RDW: 12.7 % (ref 11.5–15.5)
WBC: 6 10*3/uL (ref 4.0–10.5)
nRBC: 0 % (ref 0.0–0.2)

## 2021-02-06 LAB — COMPREHENSIVE METABOLIC PANEL
ALT: 15 U/L (ref 0–44)
AST: 26 U/L (ref 15–41)
Albumin: 3.4 g/dL — ABNORMAL LOW (ref 3.5–5.0)
Alkaline Phosphatase: 49 U/L (ref 38–126)
Anion gap: 7 (ref 5–15)
BUN: 13 mg/dL (ref 8–23)
CO2: 25 mmol/L (ref 22–32)
Calcium: 9.1 mg/dL (ref 8.9–10.3)
Chloride: 104 mmol/L (ref 98–111)
Creatinine, Ser: 0.9 mg/dL (ref 0.44–1.00)
GFR, Estimated: >60 mL/min (ref >60)
Glucose, Bld: 209 mg/dL — ABNORMAL HIGH (ref 70–99)
Potassium: 4.1 mmol/L (ref 3.5–5.1)
Sodium: 136 mmol/L (ref 135–145)
Total Bilirubin: 1.7 mg/dL — ABNORMAL HIGH (ref 0.3–1.2)
Total Protein: 6.2 g/dL — ABNORMAL LOW (ref 6.5–8.1)

## 2021-02-06 LAB — TYPE AND SCREEN
ABO/RH(D): O POS
Antibody Screen: NEGATIVE

## 2021-02-06 LAB — SARS CORONAVIRUS 2 (TAT 6-24 HRS): SARS Coronavirus 2: NEGATIVE

## 2021-02-06 NOTE — Progress Notes (Signed)
PCP - Dr. Velna Hatchet Cardiologist - Dr. Neoma Laming  PPM/ICD - N/A  Chest x-ray - 08/11/20 EKG - 08/12/20 Stress Test - Pt reports a recent ST. Date unknown. Requested from Dr. Laurelyn Sickle office. ECHO - 2014 Cardiac Cath -01/17/20   Sleep Study - OSA+ CPAP - uses nightly  Blood Thinner Instructions: Eliquis, per Dr. Humphrey Rolls hold 3 days pre-op. LD 02/06/21 Aspirin Instructions: n/a  ERAS Protcol -Clear liquids until 0700 dos PRE-SURGERY Ensure or G2- none ordered.  COVID TEST- 02/07/20; done in PAT.  Anesthesia review: Yes, pending cardiac records from Dr. Laurelyn Sickle office.   Patient denies shortness of breath, fever, cough and chest pain at PAT appointment   All instructions explained to the patient, with a verbal understanding of the material. Patient agrees to go over the instructions while at home for a better understanding. Patient also instructed to self quarantine after being tested for COVID-19. The opportunity to ask questions was provided.

## 2021-02-09 NOTE — Progress Notes (Signed)
Anesthesia Chart Review:  Follows with cardiologist Dr. Yancey Flemings in [place] for history of A. fib on Eliquis and chest pain.  Per office notes, patient had cath December 2021 showing normal coronary arteries and normal EF.  More recently, she underwent nuclear stress test 10/31/2020 which was normal, nonischemic.  Patient stated she was advised by Dr. Yancey Flemings to hold Eliquis 3 days preop, reported last dose 02/06/2021.  History of NAFLD with intermittently elevated transaminases.  OSA on CPAP.  Preop labs reviewed, T bili mildly elevated 1.7 (previous mild elevations noted in history), glucose elevated at 209 without diagnosis of diabetes, otherwise unremarkable.  EKG 08/11/2020 (in setting of ED visit for GI illness): Sinus tachycardia with PACs.  Rate 116.  Nuclear stress 10/31/2020 (see copy on chart): Impression: Normal stress test with normal LVEF.   Wynonia Musty [facility] Short Stay Center/Anesthesiology Phone [phone] 02/09/2021 10:49 AM

## 2021-02-09 NOTE — Anesthesia Preprocedure Evaluation (Addendum)
Anesthesia Evaluation  Patient identified by MRN, date of birth, ID band Patient awake    Reviewed: Allergy & Precautions, NPO status , Patient's Chart, lab work & pertinent test results  History of Anesthesia Complications Negative for: history of anesthetic complications  Airway Mallampati: II  TM Distance: >3 FB Neck ROM: Full    Dental no notable dental hx. (+) Dental Advisory Given   Pulmonary asthma , sleep apnea and Continuous Positive Airway Pressure Ventilation ,    Pulmonary exam normal        Cardiovascular hypertension, Pt. on medications Normal cardiovascular exam  S/p ablation  Nuclear stress 10/31/2020 (see copy on chart): Impression: Normal stress test with normal LVEF.   Neuro/Psych PSYCHIATRIC DISORDERS Anxiety TIA   GI/Hepatic Neg liver ROS, GERD  Medicated,  Endo/Other  negative endocrine ROS  Renal/GU negative Renal ROS     Musculoskeletal negative musculoskeletal ROS (+)   Abdominal   Peds  Hematology negative hematology ROS (+)   Anesthesia Other Findings   Reproductive/Obstetrics                           Anesthesia Physical Anesthesia Plan  ASA: 3  Anesthesia Plan: General   Post-op Pain Management: Celebrex PO (pre-op) and Tylenol PO (pre-op)   Induction: Intravenous  PONV Risk Score and Plan: 4 or greater and Ondansetron, Dexamethasone, Diphenhydramine and Treatment may vary due to age or medical condition  Airway Management Planned: Oral ETT  Additional Equipment:   Intra-op Plan:   Post-operative Plan: Extubation in OR  Informed Consent: I have reviewed the patients History and Physical, chart, labs and discussed the procedure including the risks, benefits and alternatives for the proposed anesthesia with the patient or authorized representative who has indicated his/her understanding and acceptance.     Dental advisory given  Plan Discussed  with: Anesthesiologist and CRNA  Anesthesia Plan Comments: (PAT note by Karoline Caldwell, PA-C: Follows with cardiologist Dr. Yancey Flemings in [facility] for history of A. fib on Eliquis and chest pain.  Per office notes, patient had cath December 2021 showing normal coronary arteries and normal EF.  More recently, she underwent nuclear stress test 10/31/2020 which was normal, nonischemic.  Patient stated she was advised by Dr. Yancey Flemings to hold Eliquis 3 days preop, reported last dose 02/06/2021.  History of NAFLD with intermittently elevated transaminases.  OSA on CPAP.  Preop labs reviewed, T bili mildly elevated 1.7 (previous mild elevations noted in history), glucose elevated at 209 without diagnosis of diabetes, otherwise unremarkable.  EKG 08/11/2020 (in setting of ED visit for GI illness): Sinus tachycardia with PACs.  Rate 116.  Nuclear stress 10/31/2020 (see copy on chart): Impression: Normal stress test with normal LVEF. )      Anesthesia Quick Evaluation

## 2021-02-10 ENCOUNTER — Encounter (HOSPITAL_COMMUNITY)
Admission: RE | Disposition: A | Discharge status: Home / Self Care | Payer: Self-pay | Source: Ambulatory Visit | Attending: Neurosurgery

## 2021-02-10 ENCOUNTER — Inpatient Hospital Stay (HOSPITAL_COMMUNITY): Payer: Medicare HMO | Admitting: Physician Assistant

## 2021-02-10 ENCOUNTER — Encounter (HOSPITAL_COMMUNITY): Payer: Self-pay | Admitting: Neurosurgery

## 2021-02-10 ENCOUNTER — Other Ambulatory Visit: Payer: Self-pay

## 2021-02-10 ENCOUNTER — Inpatient Hospital Stay (HOSPITAL_COMMUNITY)
Admission: RE | Admit: 2021-02-10 | Discharge: 2021-02-11 | DRG: 460 | Disposition: A | Discharge status: Home / Self Care | Payer: Medicare HMO | Source: Ambulatory Visit | Attending: Neurosurgery | Admitting: Neurosurgery

## 2021-02-10 ENCOUNTER — Inpatient Hospital Stay (HOSPITAL_COMMUNITY): Payer: Medicare HMO | Admitting: Anesthesiology

## 2021-02-10 ENCOUNTER — Inpatient Hospital Stay (HOSPITAL_COMMUNITY): Payer: Medicare HMO

## 2021-02-10 DIAGNOSIS — I440 Atrioventricular block, first degree: Secondary | ICD-10-CM | POA: Diagnosis present

## 2021-02-10 DIAGNOSIS — Z9989 Dependence on other enabling machines and devices: Secondary | ICD-10-CM | POA: Diagnosis not present

## 2021-02-10 DIAGNOSIS — J45909 Unspecified asthma, uncomplicated: Secondary | ICD-10-CM | POA: Diagnosis present

## 2021-02-10 DIAGNOSIS — M4326 Fusion of spine, lumbar region: Secondary | ICD-10-CM | POA: Diagnosis not present

## 2021-02-10 DIAGNOSIS — Z8673 Personal history of transient ischemic attack (TIA), and cerebral infarction without residual deficits: Secondary | ICD-10-CM | POA: Diagnosis not present

## 2021-02-10 DIAGNOSIS — Z20822 Contact with and (suspected) exposure to covid-19: Secondary | ICD-10-CM | POA: Diagnosis present

## 2021-02-10 DIAGNOSIS — K760 Fatty (change of) liver, not elsewhere classified: Secondary | ICD-10-CM | POA: Diagnosis present

## 2021-02-10 DIAGNOSIS — K219 Gastro-esophageal reflux disease without esophagitis: Secondary | ICD-10-CM | POA: Diagnosis present

## 2021-02-10 DIAGNOSIS — F419 Anxiety disorder, unspecified: Secondary | ICD-10-CM | POA: Diagnosis not present

## 2021-02-10 DIAGNOSIS — G8929 Other chronic pain: Secondary | ICD-10-CM | POA: Diagnosis not present

## 2021-02-10 DIAGNOSIS — I10 Essential (primary) hypertension: Secondary | ICD-10-CM | POA: Diagnosis not present

## 2021-02-10 DIAGNOSIS — Z7951 Long term (current) use of inhaled steroids: Secondary | ICD-10-CM

## 2021-02-10 DIAGNOSIS — Z86010 Personal history of colonic polyps: Secondary | ICD-10-CM

## 2021-02-10 DIAGNOSIS — Z7901 Long term (current) use of anticoagulants: Secondary | ICD-10-CM | POA: Diagnosis not present

## 2021-02-10 DIAGNOSIS — Z79899 Other long term (current) drug therapy: Secondary | ICD-10-CM

## 2021-02-10 DIAGNOSIS — M4316 Spondylolisthesis, lumbar region: Secondary | ICD-10-CM | POA: Diagnosis not present

## 2021-02-10 DIAGNOSIS — G4733 Obstructive sleep apnea (adult) (pediatric): Secondary | ICD-10-CM | POA: Diagnosis not present

## 2021-02-10 DIAGNOSIS — Z419 Encounter for procedure for purposes other than remedying health state, unspecified: Secondary | ICD-10-CM

## 2021-02-10 LAB — ABO/RH: ABO/RH(D): O POS

## 2021-02-10 SURGERY — POSTERIOR LUMBAR FUSION 1 LEVEL
Anesthesia: General | Site: Spine Lumbar

## 2021-02-10 MED ORDER — CELECOXIB 200 MG PO CAPS
200.0000 mg | ORAL_CAPSULE | Freq: Once | ORAL | Status: AC
Start: 2021-02-10 — End: 2021-02-10

## 2021-02-10 MED ORDER — SODIUM CHLORIDE 0.9 % IV SOLN: INTRAVENOUS | Status: DC

## 2021-02-10 MED ORDER — METHOCARBAMOL 1000 MG/10ML IJ SOLN
500.0000 mg | Freq: Four times a day (QID) | INTRAVENOUS | Status: DC | PRN
  Filled 2021-02-10: qty 5

## 2021-02-10 MED ORDER — CEFAZOLIN SODIUM-DEXTROSE 2-4 GM/100ML-% IV SOLN
2.0000 g | INTRAVENOUS | Status: AC
  Administered 2021-02-10: 10:00:00 2 g via INTRAVENOUS

## 2021-02-10 MED ORDER — METHOCARBAMOL 500 MG PO TABS
500.0000 mg | ORAL_TABLET | Freq: Four times a day (QID) | ORAL | Status: DC | PRN
  Administered 2021-02-10 – 2021-02-11 (×2): 500 mg via ORAL
  Filled 2021-02-10 (×3): qty 1

## 2021-02-10 MED ORDER — ONDANSETRON HCL 4 MG/2ML IJ SOLN
4.0000 mg | Freq: Four times a day (QID) | INTRAMUSCULAR | Status: DC | PRN
  Administered 2021-02-10: 15:00:00 4 mg via INTRAVENOUS
  Filled 2021-02-10: qty 2

## 2021-02-10 MED ORDER — ONDANSETRON HCL 4 MG/2ML IJ SOLN
INTRAMUSCULAR | Status: DC | PRN
  Administered 2021-02-10: 4 mg via INTRAVENOUS

## 2021-02-10 MED ORDER — DOCUSATE SODIUM 100 MG PO CAPS
100.0000 mg | ORAL_CAPSULE | Freq: Two times a day (BID) | ORAL | Status: DC
  Administered 2021-02-10 – 2021-02-11 (×2): 100 mg via ORAL
  Filled 2021-02-10 (×2): qty 1

## 2021-02-10 MED ORDER — LIDOCAINE 2% (20 MG/ML) 5 ML SYRINGE
INTRAMUSCULAR | Status: DC | PRN
  Administered 2021-02-10: 100 mg via INTRAVENOUS

## 2021-02-10 MED ORDER — CHLORHEXIDINE GLUCONATE CLOTH 2 % EX PADS: 6.0000 | MEDICATED_PAD | Freq: Once | CUTANEOUS | Status: DC

## 2021-02-10 MED ORDER — FENTANYL CITRATE (PF) 250 MCG/5ML IJ SOLN
INTRAMUSCULAR | Status: AC
  Filled 2021-02-10: qty 5

## 2021-02-10 MED ORDER — ACETAMINOPHEN 500 MG PO TABS: 1000.0000 mg | ORAL_TABLET | Freq: Once | ORAL | Status: AC

## 2021-02-10 MED ORDER — SPIRONOLACTONE 25 MG PO TABS: 25.0000 mg | ORAL_TABLET | Freq: Every day | ORAL | Status: DC

## 2021-02-10 MED ORDER — SENNA 8.6 MG PO TABS
1.0000 | ORAL_TABLET | Freq: Two times a day (BID) | ORAL | Status: DC
  Administered 2021-02-10 – 2021-02-11 (×2): 8.6 mg via ORAL
  Filled 2021-02-10 (×2): qty 1

## 2021-02-10 MED ORDER — ERGOCALCIFEROL 1.25 MG (50000 UT) PO CAPS: 50000.0000 [IU] | ORAL_CAPSULE | ORAL | Status: DC

## 2021-02-10 MED ORDER — PROMETHAZINE HCL 25 MG/ML IJ SOLN: 6.2500 mg | INTRAMUSCULAR | Status: DC | PRN

## 2021-02-10 MED ORDER — PHENOL 1.4 % MT LIQD: 1.0000 | OROMUCOSAL | Status: DC | PRN

## 2021-02-10 MED ORDER — ALBUTEROL SULFATE (2.5 MG/3ML) 0.083% IN NEBU: 2.5000 mg | INHALATION_SOLUTION | Freq: Four times a day (QID) | RESPIRATORY_TRACT | Status: DC | PRN

## 2021-02-10 MED ORDER — NITROGLYCERIN 0.4 MG SL SUBL: 0.4000 mg | SUBLINGUAL_TABLET | SUBLINGUAL | Status: DC | PRN

## 2021-02-10 MED ORDER — ACETAMINOPHEN 325 MG PO TABS
650.0000 mg | ORAL_TABLET | ORAL | Status: DC | PRN
  Administered 2021-02-10 – 2021-02-11 (×2): 650 mg via ORAL
  Filled 2021-02-10 (×2): qty 2

## 2021-02-10 MED ORDER — CAMPHOR-MENTHOL-METHYL SAL 3.1-6-10 % EX PTCH: 1.0000 | MEDICATED_PATCH | Freq: Every day | CUTANEOUS | Status: DC | PRN

## 2021-02-10 MED ORDER — ORAL CARE MOUTH RINSE: 15.0000 mL | Freq: Once | OROMUCOSAL | Status: AC

## 2021-02-10 MED ORDER — LACTATED RINGERS IV SOLN: INTRAVENOUS | Status: DC

## 2021-02-10 MED ORDER — BUPIVACAINE HCL (PF) 0.5 % IJ SOLN
INTRAMUSCULAR | Status: AC
  Filled 2021-02-10: qty 30

## 2021-02-10 MED ORDER — THROMBIN 5000 UNITS EX SOLR
CUTANEOUS | Status: DC | PRN
  Administered 2021-02-10: 11:00:00 5 mL

## 2021-02-10 MED ORDER — HYDROXYZINE HCL 50 MG/ML IM SOLN
50.0000 mg | Freq: Four times a day (QID) | INTRAMUSCULAR | Status: DC | PRN
  Administered 2021-02-10 – 2021-02-11 (×2): 50 mg via INTRAMUSCULAR
  Filled 2021-02-10 (×2): qty 1

## 2021-02-10 MED ORDER — PHENYLEPHRINE HCL (PRESSORS) 10 MG/ML IV SOLN
INTRAVENOUS | Status: AC
  Filled 2021-02-10: qty 1

## 2021-02-10 MED ORDER — SODIUM CHLORIDE 0.9% FLUSH: 3.0000 mL | INTRAVENOUS | Status: DC | PRN

## 2021-02-10 MED ORDER — FENTANYL CITRATE (PF) 100 MCG/2ML IJ SOLN: 25.0000 ug | INTRAMUSCULAR | Status: DC | PRN

## 2021-02-10 MED ORDER — CEFAZOLIN SODIUM-DEXTROSE 2-4 GM/100ML-% IV SOLN
2.0000 g | Freq: Three times a day (TID) | INTRAVENOUS | Status: AC
  Administered 2021-02-10 – 2021-02-11 (×2): 2 g via INTRAVENOUS
  Filled 2021-02-10 (×2): qty 100

## 2021-02-10 MED ORDER — THROMBIN 5000 UNITS EX SOLR
CUTANEOUS | Status: AC
  Filled 2021-02-10: qty 5000

## 2021-02-10 MED ORDER — FENTANYL CITRATE (PF) 250 MCG/5ML IJ SOLN
INTRAMUSCULAR | Status: DC | PRN
  Administered 2021-02-10: 100 ug via INTRAVENOUS
  Administered 2021-02-10 (×3): 50 ug via INTRAVENOUS

## 2021-02-10 MED ORDER — 0.9 % SODIUM CHLORIDE (POUR BTL) OPTIME
TOPICAL | Status: DC | PRN
  Administered 2021-02-10: 11:00:00 1000 mL

## 2021-02-10 MED ORDER — ACETAMINOPHEN 500 MG PO TABS
ORAL_TABLET | ORAL | Status: AC
  Administered 2021-02-10: 08:00:00 1000 mg via ORAL
  Filled 2021-02-10: qty 2

## 2021-02-10 MED ORDER — CHLORHEXIDINE GLUCONATE 0.12 % MT SOLN: 15.0000 mL | Freq: Once | OROMUCOSAL | Status: AC

## 2021-02-10 MED ORDER — OXYCODONE HCL 5 MG PO TABS: 5.0000 mg | ORAL_TABLET | ORAL | Status: DC | PRN

## 2021-02-10 MED ORDER — SODIUM CHLORIDE 0.9% FLUSH: 3.0000 mL | Freq: Two times a day (BID) | INTRAVENOUS | Status: DC

## 2021-02-10 MED ORDER — MORPHINE SULFATE (PF) 2 MG/ML IV SOLN
2.0000 mg | INTRAVENOUS | Status: DC | PRN
  Administered 2021-02-10: 15:00:00 2 mg via INTRAVENOUS
  Filled 2021-02-10: qty 1

## 2021-02-10 MED ORDER — DICYCLOMINE HCL 20 MG PO TABS: 20.0000 mg | ORAL_TABLET | Freq: Two times a day (BID) | ORAL | Status: DC

## 2021-02-10 MED ORDER — PANTOPRAZOLE SODIUM 40 MG PO TBEC
40.0000 mg | DELAYED_RELEASE_TABLET | Freq: Every day | ORAL | Status: DC
  Administered 2021-02-11: 10:00:00 40 mg via ORAL
  Filled 2021-02-10: qty 1

## 2021-02-10 MED ORDER — ROCURONIUM BROMIDE 10 MG/ML (PF) SYRINGE
PREFILLED_SYRINGE | INTRAVENOUS | Status: DC | PRN
  Administered 2021-02-10: 30 mg via INTRAVENOUS
  Administered 2021-02-10: 100 mg via INTRAVENOUS

## 2021-02-10 MED ORDER — LOSARTAN POTASSIUM 50 MG PO TABS: 50.0000 mg | ORAL_TABLET | Freq: Every day | ORAL | Status: DC

## 2021-02-10 MED ORDER — CELECOXIB 200 MG PO CAPS
ORAL_CAPSULE | ORAL | Status: AC
  Administered 2021-02-10: 08:00:00 200 mg via ORAL
  Filled 2021-02-10: qty 1

## 2021-02-10 MED ORDER — CEFAZOLIN SODIUM-DEXTROSE 2-4 GM/100ML-% IV SOLN
INTRAVENOUS | Status: AC
  Filled 2021-02-10: qty 100

## 2021-02-10 MED ORDER — LIDOCAINE-EPINEPHRINE 1 %-1:100000 IJ SOLN
INTRAMUSCULAR | Status: DC | PRN
  Administered 2021-02-10: 5 mL

## 2021-02-10 MED ORDER — MIDAZOLAM HCL 2 MG/2ML IJ SOLN
INTRAMUSCULAR | Status: DC | PRN
  Administered 2021-02-10: 1 mg via INTRAVENOUS

## 2021-02-10 MED ORDER — FUROSEMIDE 20 MG PO TABS: 20.0000 mg | ORAL_TABLET | Freq: Every day | ORAL | Status: DC

## 2021-02-10 MED ORDER — CHLORHEXIDINE GLUCONATE 0.12 % MT SOLN
OROMUCOSAL | Status: AC
  Administered 2021-02-10: 08:00:00 15 mL via OROMUCOSAL
  Filled 2021-02-10: qty 15

## 2021-02-10 MED ORDER — DEXAMETHASONE SODIUM PHOSPHATE 10 MG/ML IJ SOLN
INTRAMUSCULAR | Status: DC | PRN
  Administered 2021-02-10: 10 mg via INTRAVENOUS

## 2021-02-10 MED ORDER — AMISULPRIDE (ANTIEMETIC) 5 MG/2ML IV SOLN: 10.0000 mg | Freq: Once | INTRAVENOUS | Status: DC | PRN

## 2021-02-10 MED ORDER — PROPOFOL 10 MG/ML IV BOLUS
INTRAVENOUS | Status: DC | PRN
  Administered 2021-02-10: 120 mg via INTRAVENOUS

## 2021-02-10 MED ORDER — OXYCODONE HCL 5 MG PO TABS
10.0000 mg | ORAL_TABLET | ORAL | Status: DC | PRN
  Administered 2021-02-10 – 2021-02-11 (×4): 10 mg via ORAL
  Filled 2021-02-10 (×4): qty 2

## 2021-02-10 MED ORDER — SODIUM CHLORIDE 0.9 % IV SOLN: 250.0000 mL | INTRAVENOUS | Status: DC

## 2021-02-10 MED ORDER — MOMETASONE FURO-FORMOTEROL FUM 200-5 MCG/ACT IN AERO: 2.0000 | INHALATION_SPRAY | Freq: Two times a day (BID) | RESPIRATORY_TRACT | Status: DC

## 2021-02-10 MED ORDER — PROPOFOL 10 MG/ML IV BOLUS
INTRAVENOUS | Status: AC
  Filled 2021-02-10: qty 20

## 2021-02-10 MED ORDER — ROSUVASTATIN CALCIUM 20 MG PO TABS
20.0000 mg | ORAL_TABLET | Freq: Every day | ORAL | Status: DC
  Administered 2021-02-10: 20:00:00 20 mg via ORAL
  Filled 2021-02-10: qty 1

## 2021-02-10 MED ORDER — ACETAMINOPHEN 650 MG RE SUPP: 650.0000 mg | RECTAL | Status: DC | PRN

## 2021-02-10 MED ORDER — BISACODYL 10 MG RE SUPP: 10.0000 mg | Freq: Every day | RECTAL | Status: DC | PRN

## 2021-02-10 MED ORDER — SOTALOL HCL 120 MG PO TABS
120.0000 mg | ORAL_TABLET | Freq: Two times a day (BID) | ORAL | Status: DC
  Filled 2021-02-10 (×3): qty 1

## 2021-02-10 MED ORDER — MENTHOL 3 MG MT LOZG: 1.0000 | LOZENGE | OROMUCOSAL | Status: DC | PRN

## 2021-02-10 MED ORDER — LIDOCAINE-EPINEPHRINE 1 %-1:100000 IJ SOLN
INTRAMUSCULAR | Status: AC
  Filled 2021-02-10: qty 1

## 2021-02-10 MED ORDER — ONDANSETRON HCL 4 MG PO TABS: 4.0000 mg | ORAL_TABLET | Freq: Four times a day (QID) | ORAL | Status: DC | PRN

## 2021-02-10 MED ORDER — SUGAMMADEX SODIUM 200 MG/2ML IV SOLN
INTRAVENOUS | Status: DC | PRN
  Administered 2021-02-10 (×2): 100 mg via INTRAVENOUS

## 2021-02-10 MED ORDER — ALPRAZOLAM 0.5 MG PO TABS: 0.5000 mg | ORAL_TABLET | Freq: Every day | ORAL | Status: DC | PRN

## 2021-02-10 MED ORDER — BUPIVACAINE HCL (PF) 0.5 % IJ SOLN
INTRAMUSCULAR | Status: DC | PRN
Start: 2021-02-10 — End: 2021-02-10
  Administered 2021-02-10: 5 mL

## 2021-02-10 MED ORDER — FLUTICASONE PROPIONATE 50 MCG/ACT NA SUSP
2.0000 | Freq: Every day | NASAL | Status: DC
  Filled 2021-02-10: qty 16

## 2021-02-10 MED ORDER — MIDAZOLAM HCL 2 MG/2ML IJ SOLN
INTRAMUSCULAR | Status: AC
  Filled 2021-02-10: qty 2

## 2021-02-10 SURGICAL SUPPLY — 70 items
BAG COUNTER SPONGE SURGICOUNT (BAG) ×2 IMPLANT
BASKET BONE COLLECTION (BASKET) ×2 IMPLANT
BENZOIN TINCTURE PRP APPL 2/3 (GAUZE/BANDAGES/DRESSINGS) ×1 IMPLANT
BLADE CLIPPER SURG (BLADE) IMPLANT
BLADE SURG 11 STRL SS (BLADE) ×2 IMPLANT
BUR MATCHSTICK NEURO 3.0 LAGG (BURR) ×2 IMPLANT
BUR PRECISION FLUTE 5.0 (BURR) ×2 IMPLANT
CAGE INTERBODY PL SHT 7X22.5 (Plate) ×2 IMPLANT
CANISTER SUCT 3000ML PPV (MISCELLANEOUS) ×2 IMPLANT
CARTRIDGE OIL MAESTRO DRILL (MISCELLANEOUS) ×1 IMPLANT
CNTNR URN SCR LID CUP LEK RST (MISCELLANEOUS) ×1 IMPLANT
CONT SPEC 4OZ STRL OR WHT (MISCELLANEOUS) ×2
COVER BACK TABLE 60X90IN (DRAPES) ×2 IMPLANT
DECANTER SPIKE VIAL GLASS SM (MISCELLANEOUS) ×2 IMPLANT
DERMABOND ADVANCED (GAUZE/BANDAGES/DRESSINGS) ×1
DERMABOND ADVANCED .7 DNX12 (GAUZE/BANDAGES/DRESSINGS) ×1 IMPLANT
DIFFUSER DRILL AIR PNEUMATIC (MISCELLANEOUS) ×2 IMPLANT
DRAPE C-ARM 42X72 X-RAY (DRAPES) ×2 IMPLANT
DRAPE C-ARMOR (DRAPES) ×2 IMPLANT
DRAPE LAPAROTOMY 100X72X124 (DRAPES) ×2 IMPLANT
DRAPE SURG 17X23 STRL (DRAPES) ×2 IMPLANT
DRSG OPSITE POSTOP 4X6 (GAUZE/BANDAGES/DRESSINGS) ×1 IMPLANT
DURAPREP 26ML APPLICATOR (WOUND CARE) ×2 IMPLANT
ELECT REM PT RETURN 9FT ADLT (ELECTROSURGICAL) ×2
ELECTRODE REM PT RTRN 9FT ADLT (ELECTROSURGICAL) ×1 IMPLANT
GAUZE 4X4 16PLY ~~LOC~~+RFID DBL (SPONGE) IMPLANT
GAUZE SPONGE 4X4 12PLY STRL (GAUZE/BANDAGES/DRESSINGS) IMPLANT
GLOVE EXAM NITRILE XL STR (GLOVE) IMPLANT
GLOVE SURG ENC MOIS LTX SZ7.5 (GLOVE) IMPLANT
GLOVE SURG LTX SZ7 (GLOVE) ×4 IMPLANT
GLOVE SURG UNDER POLY LF SZ7.5 (GLOVE) ×4 IMPLANT
GOWN STRL REUS W/ TWL LRG LVL3 (GOWN DISPOSABLE) ×4 IMPLANT
GOWN STRL REUS W/ TWL XL LVL3 (GOWN DISPOSABLE) IMPLANT
GOWN STRL REUS W/TWL 2XL LVL3 (GOWN DISPOSABLE) IMPLANT
GOWN STRL REUS W/TWL LRG LVL3 (GOWN DISPOSABLE) ×8
GOWN STRL REUS W/TWL XL LVL3 (GOWN DISPOSABLE)
GRAFT BONE PROTEIOS XS 0.5CC (Orthopedic Implant) ×1 IMPLANT
HEMOSTAT POWDER KIT SURGIFOAM (HEMOSTASIS) ×2 IMPLANT
KIT BASIN OR (CUSTOM PROCEDURE TRAY) ×2 IMPLANT
KIT POSITION SURG JACKSON T1 (MISCELLANEOUS) ×2 IMPLANT
KIT TURNOVER KIT B (KITS) ×2 IMPLANT
MILL MEDIUM DISP (BLADE) ×2 IMPLANT
NDL HYPO 18GX1.5 BLUNT FILL (NEEDLE) IMPLANT
NDL SPNL 18GX3.5 QUINCKE PK (NEEDLE) IMPLANT
NEEDLE HYPO 18GX1.5 BLUNT FILL (NEEDLE) IMPLANT
NEEDLE HYPO 22GX1.5 SAFETY (NEEDLE) ×2 IMPLANT
NEEDLE SPNL 18GX3.5 QUINCKE PK (NEEDLE) IMPLANT
NS IRRIG 1000ML POUR BTL (IV SOLUTION) ×2 IMPLANT
OIL CARTRIDGE MAESTRO DRILL (MISCELLANEOUS) ×2
PACK LAMINECTOMY NEURO (CUSTOM PROCEDURE TRAY) ×2 IMPLANT
PAD ARMBOARD 7.5X6 YLW CONV (MISCELLANEOUS) ×6 IMPLANT
PUTTY GRAFTON DBF 6CC W/DELIVE (Putty) ×1 IMPLANT
ROD CC 30MM (Rod) ×2 IMPLANT
SCREW SET SOLERA (Screw) ×8 IMPLANT
SCREW SET SOLERA TI (Screw) IMPLANT
SCREW SOLERA 6.5X35 (Screw) ×2 IMPLANT
SCREW SOLERA ATS 7.5X35 (Screw) ×2 IMPLANT
SEALANT ADHERUS EXTEND TIP (MISCELLANEOUS) ×1 IMPLANT
SPONGE SURGIFOAM ABS GEL 100 (HEMOSTASIS) IMPLANT
SPONGE T-LAP 4X18 ~~LOC~~+RFID (SPONGE) IMPLANT
STRIP CLOSURE SKIN 1/2X4 (GAUZE/BANDAGES/DRESSINGS) ×1 IMPLANT
SUT PROLENE 6 0 BV (SUTURE) ×1 IMPLANT
SUT VIC AB 0 CT1 18XCR BRD8 (SUTURE) ×1 IMPLANT
SUT VIC AB 0 CT1 8-18 (SUTURE) ×4
SUT VICRYL 3-0 RB1 18 ABS (SUTURE) ×3 IMPLANT
SYR 3ML LL SCALE MARK (SYRINGE) ×6 IMPLANT
TOWEL GREEN STERILE (TOWEL DISPOSABLE) ×2 IMPLANT
TOWEL GREEN STERILE FF (TOWEL DISPOSABLE) ×2 IMPLANT
TRAY FOLEY MTR SLVR 16FR STAT (SET/KITS/TRAYS/PACK) ×2 IMPLANT
WATER STERILE IRR 1000ML POUR (IV SOLUTION) ×2 IMPLANT

## 2021-02-10 NOTE — Op Note (Signed)
NEUROSURGERY OPERATIVE NOTE   PREOP DIAGNOSIS:  1. Spondylolisthesis, L4-5  POSTOP DIAGNOSIS: Same  PROCEDURE: 1. L4 laminectomy with complete facetectomy for decompression of exiting nerve roots, more than would be required for placement of interbody graft 2. Placement of anterior interbody device - [employer] expandable 25mm lordotic cage x2 3. Posterior non-segmental instrumentation using cortical pedicle screws at L4 - L5 4. Interbody arthrodesis, L4-5 5. Use of locally harvested bone autograft 6. Use of non-structural bone allograft - DBM, ProteiOs  SURGEON: Dr. Consuella Lose, MD  ASSISTANT: Dr. Earnie Larsson, MD  ANESTHESIA: General Endotracheal  EBL: 250cc  SPECIMENS: None  DRAINS: None  COMPLICATIONS: None immediate  CONDITION: Hemodynamically stable to PACU  HISTORY: Pamela Daniel is a 71 y.o. female who has been followed in the outpatient clinic with progressively worsening back pain.  Her work-up included MRI and dynamic x-rays revealing mobile spondylolisthesis at L4-5 with associated severe bilateral facet arthropathy.  Multiple conservative treatments were attempted without significant improvement, including physical therapy, medical therapy, and injection therapy.  We ultimately elected to proceed with surgical decompression and fusion. Risks, benefits, and alternative treatments were reviewed in detail in the office. After all questions were answered, informed consent was obtained and witnessed.  PROCEDURE IN DETAIL: The patient was brought to the operating room via stretcher. After induction of general anesthesia, the patient was positioned on the operative table in the prone position. All pressure points were meticulously padded. Incision was then marked out and prepped and draped in the usual sterile fashion.  After timeout was conducted, skin was infiltrated with local anesthetic. Skin incision was then made sharply and Bovie electrocautery was used to  dissect the subcutaneous tissue until the lumbodorsal fascia was identified and incised. The muscle was then elevated in the subperiosteal plane and the L4 lamina and L4-5 facet complexes were identified. Self-retaining retractors were then placed. Lateral fluoroscopy was taken with a dissector in the L4-5 interspace to confirm our location.  At this point attention was turned to decompression. Complete L4 laminectomy was completed with a high-speed drill and Kerrison punches.  Normal dura was identified.  A ball-tipped dissector was then used to identify the foramina bilaterally.  High-speed drill was used to cut across the pars interarticularis and the inferior articulating process of L4 was removed bilaterally.  Kerrison punches were then used to remove the remaining ligamentum flavum.  The medial aspect of the superior articulating process of L5 was then removed bilaterally.  This allowed identification of the traversing L5 nerve roots bilaterally.  The superior portion of the superior articulating process of L5 was then further removed with a Kerrison punches and a high-speed drill.  This allowed access to the L4-5 disc space.    Disc space was then identified, incised bilaterally, and using a combination of shavers, curettes and rongeurs, complete discectomy was completed. Endplates were prepared with curettes, and bone harvested during decompression was mixed with DBM and ProetiOs and packed into the interspace. A 43mm expandable cage was tapped into place bilaterally. Cages were expanded bilaterally and simultaneously to achieve good endplat apposition.  Good position was confirmed with fluoroscopy.  At this point, the entry points for bilateral L4 and L5 cortical pedicle screws were identified using standard anatomic landmarks and lateral fluoro. Pilot holes were then drilled and tapped to 5.5 x 58mm. Screws were then placed in L4 measuring 6.5 x 80mm and 7.5 x 24mm in L5. Prebent 65mm lordotic rod was  then sized and placed into  the pedicle screws. Set screws were placed and final tightened. Final AP and lateral fluoroscopic images confirmed good position.  At this point, I did note a very small pinhole on the dorsal surface of the dura which did appear to be leaking some CSF.  A 6-0 Prolene stitch was placed across this pinhole.  While the CSF egress slowed down, there was some continued CSF leaking.  I therefore cover the region with morselized Gelfoam with thrombin and a layer of polyethylene glycol sealant.  Hemostasis was secured and confirmed with bipolar cautery and morcellized gelfoam with thrombin. The wound was then irrigated with copious amounts of antibiotic saline, then closed in standard fashion using a combination of interrupted 0 and 3-0 Vicryl stitches in the muscular, fascial, and subcutaneous layers. Skin was then closed using standard Dermabond. Sterile dressing was then applied. The patient was then transferred to the stretcher, extubated, and taken to the postanesthesia care unit in stable hemodynamic condition.  At the end of the case all sponge, needle, cottonoid, and instrument counts were correct.   Consuella Lose, MD [facility]

## 2021-02-10 NOTE — Anesthesia Postprocedure Evaluation (Signed)
Anesthesia Post Note  Patient: ANACAREN KOHAN  Procedure(s) Performed: Lumbar Four-Five Posterior Lumbar Interbody Fusion/interbody cages/PNSI/Autograft/allograft (Spine Lumbar)     Patient location during evaluation: PACU Anesthesia Type: General Level of consciousness: sedated Pain management: pain level controlled Vital Signs Assessment: post-procedure vital signs reviewed and stable Respiratory status: spontaneous breathing and respiratory function stable Cardiovascular status: stable Postop Assessment: no apparent nausea or vomiting Anesthetic complications: no   No notable events documented.  Last Vitals:  Vitals:   02/10/21 1353 02/10/21 1408  BP: 126/70 (!) 142/57  Pulse: (!) 51 (!) 51  Resp: 10 10  Temp:    SpO2: 98% 99%    Last Pain:  Vitals:   02/10/21 1408  TempSrc:   PainSc: 4                  Irven Ingalsbe DANIEL

## 2021-02-10 NOTE — Progress Notes (Signed)
Orthopedic Tech Progress Note Patient Details:  Pamela Daniel 07-13-1950 [id number]  Ortho Devices Type of Ortho Device: Lumbar corsett Ortho Device/Splint Location: BACK Ortho Device/Splint Interventions: Ordered   Post Interventions Patient Tolerated: Well Instructions Provided: Care of device  [name] 02/10/2021, 3:44 PM

## 2021-02-10 NOTE — Transfer of Care (Signed)
Immediate Anesthesia Transfer of Care Note  Patient: Pamela Daniel  Procedure(s) Performed: Lumbar Four-Five Posterior Lumbar Interbody Fusion/interbody cages/PNSI/Autograft/allograft (Spine Lumbar)  Patient Location: PACU  Anesthesia Type:General  Level of Consciousness: awake, alert  and oriented  Airway & Oxygen Therapy: Patient Spontanous Breathing and Patient connected to face mask oxygen  Post-op Assessment: Report given to RN and Post -op Vital signs reviewed and stable  Post vital signs: Reviewed and stable  Last Vitals:  Vitals Value Taken Time  BP 118/71 02/10/21 1323  Temp 36.6 C 02/10/21 1323  Pulse 54 02/10/21 1327  Resp 10 02/10/21 1327  SpO2 99 % 02/10/21 1327  Vitals shown include unvalidated device data.  Last Pain:  Vitals:   02/10/21 0809  TempSrc:   PainSc: 3          Complications: No notable events documented.

## 2021-02-10 NOTE — Anesthesia Procedure Notes (Signed)
Procedure Name: Intubation Date/Time: 02/10/2021 9:54 AM Performed by: Trinna Post., CRNA Pre-anesthesia Checklist: Patient identified, Emergency Drugs available, Suction available, Patient being monitored and Timeout performed Patient Re-evaluated:Patient Re-evaluated prior to induction Oxygen Delivery Method: Circle system utilized Preoxygenation: Pre-oxygenation with 100% oxygen Induction Type: IV induction Ventilation: Mask ventilation without difficulty Laryngoscope Size: Mac and 3 Grade View: Grade II Tube type: Oral Tube size: 7.0 mm Number of attempts: 1 Airway Equipment and Method: Stylet Placement Confirmation: ETT inserted through vocal cords under direct vision, positive ETCO2 and breath sounds checked- equal and bilateral Secured at: 21 cm Tube secured with: Tape Dental Injury: Teeth and Oropharynx as per pre-operative assessment

## 2021-02-10 NOTE — H&P (Signed)
Chief Complaint   Back pain  History of Present Illness  Pamela Daniel is a 71 y.o. female initially seen in the outpatient neurosurgery clinic for relatively chronic low back pain.  She has had some form of low back pain for several decades along the last few years it has slowly but progressively been worsening.  She now describes pain severe enough that it affects her ability to perform normal daily activities including getting out of the car and cooking a meal at the stove.  She describes primarily pain in the left midline lower back with some radiation bilaterally into the hips.  No significant amount of pain radiating into the legs nor does she complain of paresthesias in the feet.  Pain is exacerbated when standing or sitting for any length of time.  She has attempted multiple different conservative treatments including a course of physical therapy and facet joint injections which initially provided significant improvement however more recent injections have not provided any significant help.  Past Medical History   Past Medical History:  Diagnosis Date   Anxiety    Arthritis    SPINE   Asthma    Blood transfusion without reported diagnosis    Fatty liver    Fatty pancreas    First degree heart block    Frequency of urination    GERD (gastroesophageal reflux disease)    Hemorrhoids    Hepatic steatosis    Hiatal hernia    POST RESIDUAL SMALL HH PER IMAGING   History of acute pancreatitis 03/16/2015   History of adenomatous polyp of colon    tubular adenoma's 04-08-2014/   hyperplastic bening polyp 2000   History of concussion 2012   no residual   History of kidney stones    History of transient ischemic attack (TIA) 12/22/2011   no residual   History of vaginal dysplasia 2016   Hypertension    Pancreatic cyst    Pseudocyst of pancreas    PSVT (paroxysmal supraventricular tachycardia) ([place])    cardioloigst-  dr Einar Gip--- last visit 2013 per pt and is currently followed  by pcp   Rectal bleeding    S/P AV (atrioventricular) nodal ablation 01-21-2010    dr Caryl Comes   Simple renal cyst    bilateral per imaging   Urgency of urination    Urinary frequency    Urinary leakage     Past Surgical History   Past Surgical History:  Procedure Laterality Date   BREAST EXCISIONAL BIOPSY Right    CARDIAC ELECTROPHYSIOLOGY MAPPING AND ABLATION  01-21-2010   dr Caryl Comes   ablation AVNRT   FOOT NEUROMA SURGERY Left 1996   HEMORRHOID SURGERY N/A 10/28/2016   Procedure: HEMORRHOIDECTOMY AND HEMORRHOIDAL PEXY;  Surgeon: Leighton Ruff, MD;  Location: [place];  Service: General;  Laterality: N/A;   LAPAROSCOPY TAKEDOWN AND REPAIR HIATAL HERNIA /  NISSEN FUNDOPLATION  09-01-2005    dr Hassell Done  [facility]   LEFT HEART CATH AND CORONARY ANGIOGRAPHY N/A 01/17/2020   Procedure: LEFT HEART CATH AND CORONARY ANGIOGRAPHY;  Surgeon: Dionisio David, MD;  Location: [facility];  Service: Cardiovascular;  Laterality: N/A;   NASAL SEPTUM SURGERY  1991   TEE WITHOUT CARDIOVERSION  02/15/2012   Procedure: TRANSESOPHAGEAL ECHOCARDIOGRAM (TEE);  Surgeon: Laverda Page, MD;  Location: [place];  Service: Cardiovascular;  Laterality: N/A;  normal LV, normal EF, mild MR, trace TR, trace PI   TRANSTHORACIC ECHOCARDIOGRAM  12-22-2011    dr Einar Gip  normal echo   VAGINAL HYSTERECTOMY  1996   w/  BSO    Social History   Social History   Tobacco Use   Smoking status: Never   Smokeless tobacco: Never  Vaping Use   Vaping Use: Never used  Substance Use Topics   Alcohol use: No   Drug use: No    Medications   Prior to Admission medications   Medication Sig Start Date End Date Taking? Authorizing Provider  acetaminophen (TYLENOL) 500 MG tablet Take 1,000 mg by mouth every 6 (six) hours as needed for moderate pain.   Yes [provider]  albuterol (PROVENTIL) (2.5 MG/3ML) 0.083% nebulizer solution Take 3 mLs (2.5 mg total) by nebulization every 6 (six)  hours as needed for wheezing or shortness of breath. 02/28/18  Yes Yu, Amy V, PA-C  ALPRAZolam Duanne Moron) 0.5 MG tablet Take 0.5 mg by mouth daily as needed for anxiety. 06/25/20  Yes [provider]  apixaban (ELIQUIS) 5 MG TABS tablet Take 5 mg by mouth 2 (two) times daily.   Yes [provider]  Camphor-Menthol-Methyl Sal (SALONPAS) 3.01-24-08 % PTCH Place 1 patch onto the skin daily as needed (pain).   Yes [provider]  ergocalciferol (VITAMIN D2) 1.25 MG (50000 UT) capsule Take 50,000 Units by mouth once a week.   Yes [provider]  fluticasone (FLONASE) 50 MCG/ACT nasal spray Place 2 sprays into both nostrils daily.   Yes [provider]  furosemide (LASIX) 20 MG tablet Take 20 mg by mouth.   Yes [provider]  losartan (COZAAR) 50 MG tablet Take 50 mg by mouth daily.   Yes [provider]  nitroGLYCERIN (NITROSTAT) 0.4 MG SL tablet Place 0.4 mg under the tongue every 5 (five) minutes as needed for chest pain.   Yes [provider]  NON FORMULARY Pt uses a cpap nightly   Yes [provider]  pantoprazole (PROTONIX) 40 MG tablet Take 40 mg by mouth daily. 04/02/20  Yes [provider]  rosuvastatin (CRESTOR) 20 MG tablet Take 20 mg by mouth at bedtime. 01/16/20  Yes [provider]  sotalol (BETAPACE) 120 MG tablet Take 120 mg by mouth 2 (two) times daily.   Yes [provider]  spironolactone (ALDACTONE) 25 MG tablet Take 25 mg by mouth daily. 11/24/19  Yes [provider]  budesonide-formoterol (SYMBICORT) 160-4.5 MCG/ACT inhaler Inhale 2 puffs into the lungs daily as needed (Shortness of breath).    [provider]  dicyclomine (BENTYL) 20 MG tablet Take 1 tablet (20 mg total) by mouth 2 (two) times daily. Patient not taking: Reported on 01/23/2021 08/11/20   Jacqlyn Larsen, PA-C  ondansetron (ZOFRAN ODT) 4 MG disintegrating tablet 4mg  ODT q4 hours prn nausea/vomit Patient  not taking: Reported on 01/23/2021 08/11/20   Jacqlyn Larsen, PA-C    Allergies  No Known Allergies  Review of Systems  ROS  Neurologic Exam  Awake, alert, oriented Memory and concentration grossly intact Speech fluent, appropriate CN grossly intact Motor exam: Upper Extremities Deltoid Bicep Tricep Grip  Right 5/5 5/5 5/5 5/5  Left 5/5 5/5 5/5 5/5   Lower Extremities IP Quad PF DF EHL  Right 5/5 5/5 5/5 5/5 5/5  Left 5/5 5/5 5/5 5/5 5/5   Sensation grossly intact to LT  Imaging  MRI of the lumbar spine dated 12/26/2020 was personally reviewed.  This demonstrates maintenance of lumbar lordosis.  There is grade 1-2 anterolisthesis of L4 and L5  with note made of transitional lumbosacral anatomy and a largely sacralized L5.  There is also grade 1 anterolisthesis of L3 on L4.  There is significant facet arthropathy bilaterally at L4-5.  Dynamic lumbar spine x-rays were also reviewed and demonstrate again trace grade 1 anterolisthesis of L3 on L4.  The L4-5 spondylolisthesis appears to be mobile increasing from approximately 7 mm in the supine position to approximately 9 mm upright.  Impression  - 71 y.o. female with relatively chronic but worsening low back pain related to mobile spondylolisthesis at L4-5.  She has failed reasonable conservative treatment  Plan  -We will plan on proceeding with instrumented stabilization and fusion at L4-5  I have reviewed the details of the procedure as well as the expected postoperative course and recovery in detail with the patient in the office.  We have discussed the associated risks, benefits, and alternatives to surgery.  All her questions today were answered.  She provided informed consent to proceed.   Consuella Lose, MD [facility]

## 2021-02-11 MED ORDER — HYDROCODONE-ACETAMINOPHEN 5-325 MG PO TABS
1.0000 | ORAL_TABLET | Freq: Four times a day (QID) | ORAL | 0 refills | Status: AC | PRN
Start: 2021-02-11 — End: 2021-02-18

## 2021-02-11 MED ORDER — HYDROCODONE-ACETAMINOPHEN 5-325 MG PO TABS
1.0000 | ORAL_TABLET | ORAL | Status: DC | PRN
  Administered 2021-02-11: 10:00:00 2 via ORAL
  Filled 2021-02-11: qty 2

## 2021-02-11 MED ORDER — METHOCARBAMOL 500 MG PO TABS: 500.0000 mg | ORAL_TABLET | Freq: Four times a day (QID) | ORAL | 0 refills | Status: DC | PRN

## 2021-02-11 MED ORDER — APIXABAN 5 MG PO TABS: 5.0000 mg | ORAL_TABLET | Freq: Two times a day (BID) | ORAL | Status: DC

## 2021-02-11 NOTE — Plan of Care (Signed)
Pt doing well. Pt and daughter given D/C instructions with verbal understanding. Rx's were sent to the pharmacy by MD. Pt's incision is clean and dry with no sign of infection. Pt's IV was removed prior to D/C. Pt received 3-n-1 from [employer] per MD order. Pt D/C'd home via wheelchair per MD order. Pt is stable @ D/C and has no other needs at this time. Holli Humbles, RN

## 2021-02-11 NOTE — Evaluation (Signed)
Occupational Therapy Evaluation Patient Details Name: Pamela Daniel MRN: [id number] DOB: 1950-11-20 Today's Date: 02/11/2021   History of Present Illness Pt is a 71 y.o. F who presents with L4-5 spondylolisthesis now s/p L4-5 PLIF 02/10/2021. Significant PMH: asthma   Clinical Impression   Pt admitted for procedure listed above. PTA pt reported that she was independent with all ADL's and IADL's, however they required increased time due to pain. At this time, pt is able to complete functional mobility with a RW and ADL's with increased time and compensatory strategies. All questions and concerns answered, pt has no further OT needs and acute OT will sign off.       Recommendations for follow up therapy are one component of a multi-disciplinary discharge planning process, led by the attending physician.  Recommendations may be updated based on patient status, additional functional criteria and insurance authorization.   Follow Up Recommendations  No OT follow up    Assistance Recommended at Discharge PRN  Patient can return home with the following Assistance with cooking/housework;Help with stairs or ramp for entrance    Functional Status Assessment  Patient has had a recent decline in their functional status and demonstrates the ability to make significant improvements in function in a reasonable and predictable amount of time.  Equipment Recommendations  BSC/3in1    Recommendations for Other Services       Precautions / Restrictions Precautions Precautions: Back;Fall Precaution Booklet Issued: Yes (comment) Precaution Comments: verbally reviewed, provided written handout Required Braces or Orthoses: Spinal Brace Spinal Brace: Lumbar corset;Applied in sitting position Restrictions Weight Bearing Restrictions: No      Mobility Bed Mobility Overal bed mobility: Modified Independent             General bed mobility comments: cues for log roll technique     Transfers Overall transfer level: Modified independent Equipment used: Rolling walker (2 wheels)                      Balance Overall balance assessment: Mild deficits observed, not formally tested                                         ADL either performed or assessed with clinical judgement   ADL Overall ADL's : Modified independent                                       General ADL Comments: Pt limited by pain, requiring increased time, however able to complete all ADL's with no physical assist following compensatory strategies.     Vision Baseline Vision/History: 0 No visual deficits Ability to See in Adequate Light: 0 Adequate Patient Visual Report: No change from baseline Vision Assessment?: No apparent visual deficits     Perception     Praxis      Pertinent Vitals/Pain Pain Assessment Pain Assessment: Faces Faces Pain Scale: Hurts little more Pain Location: back Pain Descriptors / Indicators: Operative site guarding, Grimacing Pain Intervention(s): Monitored during session, Repositioned     Hand Dominance Right   Extremity/Trunk Assessment Upper Extremity Assessment Upper Extremity Assessment: Overall WFL for tasks assessed   Lower Extremity Assessment Lower Extremity Assessment: Defer to PT evaluation   Cervical / Trunk Assessment Cervical / Trunk Assessment: Back Surgery   Communication Communication  Communication: No difficulties   Cognition Arousal/Alertness: Awake/alert Behavior During Therapy: WFL for tasks assessed/performed Overall Cognitive Status: Within Functional Limits for tasks assessed                                       General Comments  VSS on RA, Pt became nauseous during session, RN notified    Exercises     Shoulder Instructions      Home Living Family/patient expects to be discharged to:: Private residence Living Arrangements: Alone Available Help at  Discharge: Family;Friend(s);Available PRN/intermittently (daughter, pastor, friend) Type of Home: House Home Access: Stairs to enter Technical brewer of Steps: 6 Entrance Stairs-Rails: Left Home Layout: One level     Bathroom Shower/Tub: Teacher, early years/pre: Standard     Home Equipment: Conservation officer, nature (2 wheels)          Prior Functioning/Environment Prior Level of Function : Needs assist             Mobility Comments: using walker, limited household ambulator ADLs Comments: has been having difficult performing IADL's i.e. sitting to wash dishes        OT Problem List: Decreased strength;Decreased activity tolerance;Pain      OT Treatment/Interventions:      OT Goals(Current goals can be found in the care plan section) Acute Rehab OT Goals Patient Stated Goal: To go home OT Goal Formulation: All assessment and education complete, DC therapy Time For Goal Achievement: 02/11/21 Potential to Achieve Goals: Good  OT Frequency:      Co-evaluation              AM-PAC OT "6 Clicks" Daily Activity     Outcome Measure Help from another person eating meals?: None Help from another person taking care of personal grooming?: None Help from another person toileting, which includes using toliet, bedpan, or urinal?: None Help from another person bathing (including washing, rinsing, drying)?: None Help from another person to put on and taking off regular upper body clothing?: None Help from another person to put on and taking off regular lower body clothing?: None 6 Click Score: 24   End of Session Equipment Utilized During Treatment: Rolling walker (2 wheels);Back brace Nurse Communication: Mobility status  Activity Tolerance: Patient tolerated treatment well Patient left: in bed;with call bell/phone within reach;with family/visitor present  OT Visit Diagnosis: Unsteadiness on feet (R26.81);Other abnormalities of gait and mobility (R26.89);Muscle  weakness (generalized) (M62.81)                Time: 0539-7673 OT Time Calculation (min): 32 min Charges:  OT General Charges $OT Visit: 1 Visit OT Evaluation $OT Eval Moderate Complexity: 1 Mod OT Treatments $Self Care/Home Management : 8-22 mins  Dale Strausser H., OTR/L Acute Rehabilitation  Jasmaine Rochel Elane Yolanda Bonine 02/11/2021, 9:44 AM

## 2021-02-11 NOTE — Discharge Summary (Signed)
Physician Discharge Summary  Patient ID: CHARNE MCBRIEN MRN: [id number] DOB/AGE: 1950/09/07 71 y.o.  Admit date: 02/10/2021 Discharge date: 02/11/2021  Admission Diagnoses:  Spondylolisthesis L4-5  Discharge Diagnoses:  Same Principal Problem:   Spondylolisthesis at L4-L5 level   Discharged Condition: Stable  Hospital Course:  MASIEL GENTZLER is a 71 y.o. female admitted after L4-5 PLIF. She was doing well postop reporting improvement in preop back pain. She was ambulating well, tolerating diet, voiding normally. She therefore requested d/c home.  Treatments: Surgery - L4-5 PLIF  Discharge Exam: Blood pressure (!) 108/56, pulse 67, temperature 98.2 F (36.8 C), temperature source Oral, resp. rate 18, height 5\' 1"  (1.549 m), weight 85.3 kg, SpO2 96 %. Awake, alert, oriented Speech fluent, appropriate CN grossly intact 5/5 BUE/BLE Wound c/d/i  Disposition: Discharge disposition: 01-Home or Self Care       Discharge Instructions     Call MD for:  redness, tenderness, or signs of infection (pain, swelling, redness, odor or green/yellow discharge around incision site)   Complete by: As directed    Call MD for:  temperature >100.4   Complete by: As directed    Diet - low sodium heart healthy   Complete by: As directed    Discharge instructions   Complete by: As directed    Walk at home as much as possible, at least 4 times / day   Increase activity slowly   Complete by: As directed    Lifting restrictions   Complete by: As directed    No lifting > 10 lbs   May shower / Bathe   Complete by: As directed    48 hours after surgery   May walk up steps   Complete by: As directed    Other Restrictions   Complete by: As directed    No bending/twisting at waist   Remove dressing in 24 hours   Complete by: As directed       Allergies as of 02/11/2021   No Known Allergies      Medication List     TAKE these medications    acetaminophen 500 MG  tablet Commonly known as: TYLENOL Take 1,000 mg by mouth every 6 (six) hours as needed for moderate pain.   albuterol (2.5 MG/3ML) 0.083% nebulizer solution Commonly known as: PROVENTIL Take 3 mLs (2.5 mg total) by nebulization every 6 (six) hours as needed for wheezing or shortness of breath.   ALPRAZolam 0.5 MG tablet Commonly known as: XANAX Take 0.5 mg by mouth daily as needed for anxiety.   apixaban 5 MG Tabs tablet Commonly known as: ELIQUIS Take 1 tablet (5 mg total) by mouth 2 (two) times daily. Start taking on: February 15, 2021 What changed: These instructions start on February 15, 2021. If you are unsure what to do until then, ask your doctor or other care provider.   budesonide-formoterol 160-4.5 MCG/ACT inhaler Commonly known as: SYMBICORT Inhale 2 puffs into the lungs daily as needed (Shortness of breath).   ergocalciferol 1.25 MG (50000 UT) capsule Commonly known as: VITAMIN D2 Take 50,000 Units by mouth once a week.   fluticasone 50 MCG/ACT nasal spray Commonly known as: FLONASE Place 2 sprays into both nostrils daily.   furosemide 20 MG tablet Commonly known as: LASIX Take 20 mg by mouth.   HYDROcodone-acetaminophen 5-325 MG tablet Commonly known as: NORCO/VICODIN Take 1-2 tablets by mouth every 6 (six) hours as needed for up to 7 days for moderate pain.   losartan  50 MG tablet Commonly known as: COZAAR Take 50 mg by mouth daily.   methocarbamol 500 MG tablet Commonly known as: ROBAXIN Take 1 tablet (500 mg total) by mouth every 6 (six) hours as needed for muscle spasms.   nitroGLYCERIN 0.4 MG SL tablet Commonly known as: NITROSTAT Place 0.4 mg under the tongue every 5 (five) minutes as needed for chest pain.   NON FORMULARY Pt uses a cpap nightly   pantoprazole 40 MG tablet Commonly known as: PROTONIX Take 40 mg by mouth daily.   rosuvastatin 20 MG tablet Commonly known as: CRESTOR Take 20 mg by mouth at bedtime.   Salonpas 3.01-24-08 %  Ptch Generic drug: Camphor-Menthol-Methyl Sal Place 1 patch onto the skin daily as needed (pain).   sotalol 120 MG tablet Commonly known as: BETAPACE Take 120 mg by mouth 2 (two) times daily.   spironolactone 25 MG tablet Commonly known as: ALDACTONE Take 25 mg by mouth daily.        Follow-up Information     Consuella Lose, MD Follow up.   Specialty: Neurosurgery Contact information: [address] [place] [address] [phone]                 Signed: Jairo Ben 02/11/2021, 12:40 PM

## 2021-02-11 NOTE — Evaluation (Signed)
Physical Therapy Evaluation Patient Details Name: Pamela Daniel MRN: [id number] DOB: [birth year] Today's Date: 02/11/2021  History of Present Illness  Pt is a 71 y.o. F who presents with L4-5 spondylolisthesis now s/p L4-5 PLIF 02/10/2021. Significant PMH: asthma  Clinical Impression  Patient evaluated by Physical Therapy with no further acute PT needs identified. Pt reports improved pain control and activity tolerance post op. Ambulating 400 feet with a walker and negotiated 3 steps without physical assist. Education provided regarding spinal precautions, brace use, activity recommendations, and assist for IADL's. All education has been completed and the patient has no further questions. No follow-up Physical Therapy or equipment needs. PT is signing off. Thank you for this referral.      Recommendations for follow up therapy are one component of a multi-disciplinary discharge planning process, led by the attending physician.  Recommendations may be updated based on patient status, additional functional criteria and insurance authorization.  Follow Up Recommendations No PT follow up    Assistance Recommended at Discharge PRN  Patient can return home with the following  Assistance with cooking/housework    Equipment Recommendations BSC/3in1  Recommendations for Other Services       Functional Status Assessment Patient has had a recent decline in their functional status and demonstrates the ability to make significant improvements in function in a reasonable and predictable amount of time.     Precautions / Restrictions Precautions Precautions: Back;Fall Precaution Booklet Issued: Yes (comment) Precaution Comments: verbally reviewed, provided written handout Required Braces or Orthoses: Spinal Brace Spinal Brace: Lumbar corset;Applied in sitting position Restrictions Weight Bearing Restrictions: No      Mobility  Bed Mobility Overal bed mobility: Modified Independent              General bed mobility comments: cues for log roll technique    Transfers Overall transfer level: Modified independent Equipment used: Rolling walker (2 wheels)                    Ambulation/Gait Ambulation/Gait assistance: Modified independent (Device/Increase time) Gait Distance (Feet): 400 Feet Assistive device: Rolling walker (2 wheels) Gait Pattern/deviations: WFL(Within Functional Limits)       General Gait Details: slow and steady pace  Stairs Stairs: Yes Stairs assistance: Min guard Stair Management: One rail Left Number of Stairs: 3 General stair comments: cues for step by step,min guard overall for safety  Wheelchair Mobility    Modified Rankin (Stroke Patients Only)       Balance Overall balance assessment: Mild deficits observed, not formally tested                                           Pertinent Vitals/Pain Pain Assessment Pain Assessment: Faces Faces Pain Scale: Hurts little more Pain Location: back Pain Descriptors / Indicators: Operative site guarding, Grimacing Pain Intervention(s): Monitored during session    Home Living Family/patient expects to be discharged to:: Private residence Living Arrangements: Alone Available Help at Discharge: Family;Friend(s);Available PRN/intermittently (daughter, pastor, friend) Type of Home: House Home Access: Stairs to enter Entrance Stairs-Rails: Left Entrance Stairs-Number of Steps: 6   Home Layout: One level Home Equipment: Conservation officer, nature (2 wheels)      Prior Function Prior Level of Function : Needs assist             Mobility Comments: using walker, limited household ambulator ADLs Comments: has been  having difficult performing IADL's i.e. sitting to wash dishes     Hand Dominance        Extremity/Trunk Assessment   Upper Extremity Assessment Upper Extremity Assessment: Defer to OT evaluation    Lower Extremity Assessment Lower Extremity  Assessment: Overall WFL for tasks assessed    Cervical / Trunk Assessment Cervical / Trunk Assessment: Back Surgery  Communication   Communication: No difficulties  Cognition Arousal/Alertness: Awake/alert Behavior During Therapy: WFL for tasks assessed/performed Overall Cognitive Status: Within Functional Limits for tasks assessed                                          General Comments      Exercises     Assessment/Plan    PT Assessment Patient does not need any further PT services  PT Problem List         PT Treatment Interventions      PT Goals (Current goals can be found in the Care Plan section)  Acute Rehab PT Goals Patient Stated Goal: walk longer distances PT Goal Formulation: All assessment and education complete, DC therapy    Frequency       Co-evaluation               AM-PAC PT "6 Clicks" Mobility  Outcome Measure Help needed turning from your back to your side while in a flat bed without using bedrails?: None Help needed moving from lying on your back to sitting on the side of a flat bed without using bedrails?: None Help needed moving to and from a bed to a chair (including a wheelchair)?: None Help needed standing up from a chair using your arms (e.g., wheelchair or bedside chair)?: None Help needed to walk in hospital room?: None Help needed climbing 3-5 steps with a railing? : A Little 6 Click Score: 23    End of Session Equipment Utilized During Treatment: Back brace Activity Tolerance: Patient tolerated treatment well Patient left: in bed;with call bell/phone within reach;Other (comment) (with OT) Nurse Communication: Mobility status      Time: [id number] PT Time Calculation (min) (ACUTE ONLY): 30 min   Charges:   PT Evaluation $PT Eval Low Complexity: 1 Low PT Treatments $Therapeutic Activity: 8-22 mins        Wyona Almas, PT, DPT Acute Rehabilitation Services Pager [phone] Office  [phone]   Deno Etienne 02/11/2021, 9:42 AM

## 2021-02-12 ENCOUNTER — Other Ambulatory Visit: Payer: Self-pay

## 2021-03-04 DIAGNOSIS — M47816 Spondylosis without myelopathy or radiculopathy, lumbar region: Secondary | ICD-10-CM | POA: Diagnosis not present

## 2021-03-04 DIAGNOSIS — M4316 Spondylolisthesis, lumbar region: Secondary | ICD-10-CM | POA: Diagnosis not present

## 2021-03-04 DIAGNOSIS — Z6834 Body mass index (BMI) 34.0-34.9, adult: Secondary | ICD-10-CM | POA: Diagnosis not present

## 2021-03-04 DIAGNOSIS — I10 Essential (primary) hypertension: Secondary | ICD-10-CM | POA: Diagnosis not present

## 2021-04-02 DIAGNOSIS — I10 Essential (primary) hypertension: Secondary | ICD-10-CM | POA: Diagnosis not present

## 2021-04-02 DIAGNOSIS — E559 Vitamin D deficiency, unspecified: Secondary | ICD-10-CM | POA: Diagnosis not present

## 2021-04-02 DIAGNOSIS — R739 Hyperglycemia, unspecified: Secondary | ICD-10-CM | POA: Diagnosis not present

## 2021-04-09 DIAGNOSIS — M4306 Spondylolysis, lumbar region: Secondary | ICD-10-CM | POA: Diagnosis not present

## 2021-04-09 DIAGNOSIS — D696 Thrombocytopenia, unspecified: Secondary | ICD-10-CM | POA: Diagnosis not present

## 2021-04-09 DIAGNOSIS — Z1331 Encounter for screening for depression: Secondary | ICD-10-CM | POA: Diagnosis not present

## 2021-04-09 DIAGNOSIS — M8580 Other specified disorders of bone density and structure, unspecified site: Secondary | ICD-10-CM | POA: Diagnosis not present

## 2021-04-09 DIAGNOSIS — I10 Essential (primary) hypertension: Secondary | ICD-10-CM | POA: Diagnosis not present

## 2021-04-09 DIAGNOSIS — Z1389 Encounter for screening for other disorder: Secondary | ICD-10-CM | POA: Diagnosis not present

## 2021-04-09 DIAGNOSIS — Z0000 Encounter for general adult medical examination without abnormal findings: Secondary | ICD-10-CM | POA: Diagnosis not present

## 2021-04-09 DIAGNOSIS — I480 Paroxysmal atrial fibrillation: Secondary | ICD-10-CM | POA: Diagnosis not present

## 2021-04-09 DIAGNOSIS — Z7901 Long term (current) use of anticoagulants: Secondary | ICD-10-CM | POA: Diagnosis not present

## 2021-04-09 DIAGNOSIS — D6859 Other primary thrombophilia: Secondary | ICD-10-CM | POA: Diagnosis not present

## 2021-04-09 DIAGNOSIS — E785 Hyperlipidemia, unspecified: Secondary | ICD-10-CM | POA: Diagnosis not present

## 2021-04-09 DIAGNOSIS — F419 Anxiety disorder, unspecified: Secondary | ICD-10-CM | POA: Diagnosis not present

## 2021-04-09 DIAGNOSIS — R82998 Other abnormal findings in urine: Secondary | ICD-10-CM | POA: Diagnosis not present

## 2021-04-09 DIAGNOSIS — L309 Dermatitis, unspecified: Secondary | ICD-10-CM | POA: Diagnosis not present

## 2021-04-10 DIAGNOSIS — I4891 Unspecified atrial fibrillation: Secondary | ICD-10-CM | POA: Diagnosis not present

## 2021-04-10 DIAGNOSIS — I2510 Atherosclerotic heart disease of native coronary artery without angina pectoris: Secondary | ICD-10-CM | POA: Diagnosis not present

## 2021-04-10 DIAGNOSIS — E782 Mixed hyperlipidemia: Secondary | ICD-10-CM | POA: Diagnosis not present

## 2021-04-10 DIAGNOSIS — I10 Essential (primary) hypertension: Secondary | ICD-10-CM | POA: Diagnosis not present

## 2021-04-10 DIAGNOSIS — I340 Nonrheumatic mitral (valve) insufficiency: Secondary | ICD-10-CM | POA: Diagnosis not present

## 2021-04-10 DIAGNOSIS — E669 Obesity, unspecified: Secondary | ICD-10-CM | POA: Diagnosis not present

## 2021-04-17 DIAGNOSIS — I10 Essential (primary) hypertension: Secondary | ICD-10-CM | POA: Diagnosis not present

## 2021-04-17 DIAGNOSIS — I480 Paroxysmal atrial fibrillation: Secondary | ICD-10-CM | POA: Diagnosis not present

## 2021-04-17 DIAGNOSIS — M8580 Other specified disorders of bone density and structure, unspecified site: Secondary | ICD-10-CM | POA: Diagnosis not present

## 2021-05-17 DIAGNOSIS — I480 Paroxysmal atrial fibrillation: Secondary | ICD-10-CM | POA: Diagnosis not present

## 2021-05-17 DIAGNOSIS — I10 Essential (primary) hypertension: Secondary | ICD-10-CM | POA: Diagnosis not present

## 2021-05-17 DIAGNOSIS — M8580 Other specified disorders of bone density and structure, unspecified site: Secondary | ICD-10-CM | POA: Diagnosis not present

## 2021-06-25 DIAGNOSIS — N390 Urinary tract infection, site not specified: Secondary | ICD-10-CM | POA: Diagnosis not present

## 2021-06-25 DIAGNOSIS — R32 Unspecified urinary incontinence: Secondary | ICD-10-CM | POA: Diagnosis not present

## 2021-07-13 DIAGNOSIS — Z6834 Body mass index (BMI) 34.0-34.9, adult: Secondary | ICD-10-CM | POA: Diagnosis not present

## 2021-07-13 DIAGNOSIS — M4316 Spondylolisthesis, lumbar region: Secondary | ICD-10-CM | POA: Diagnosis not present

## 2021-08-11 DIAGNOSIS — Z809 Family history of malignant neoplasm, unspecified: Secondary | ICD-10-CM | POA: Diagnosis not present

## 2021-08-11 DIAGNOSIS — L900 Lichen sclerosus et atrophicus: Secondary | ICD-10-CM | POA: Diagnosis not present

## 2021-08-11 DIAGNOSIS — R32 Unspecified urinary incontinence: Secondary | ICD-10-CM | POA: Diagnosis not present

## 2021-08-18 DIAGNOSIS — Z4689 Encounter for fitting and adjustment of other specified devices: Secondary | ICD-10-CM | POA: Diagnosis not present

## 2021-08-28 DIAGNOSIS — N812 Incomplete uterovaginal prolapse: Secondary | ICD-10-CM | POA: Diagnosis not present

## 2021-12-01 DIAGNOSIS — R051 Acute cough: Secondary | ICD-10-CM | POA: Diagnosis not present

## 2021-12-01 DIAGNOSIS — J988 Other specified respiratory disorders: Secondary | ICD-10-CM | POA: Diagnosis not present

## 2021-12-01 DIAGNOSIS — I517 Cardiomegaly: Secondary | ICD-10-CM | POA: Diagnosis not present

## 2021-12-01 DIAGNOSIS — B9689 Other specified bacterial agents as the cause of diseases classified elsewhere: Secondary | ICD-10-CM | POA: Diagnosis not present

## 2021-12-14 DIAGNOSIS — I340 Nonrheumatic mitral (valve) insufficiency: Secondary | ICD-10-CM | POA: Diagnosis not present

## 2021-12-14 DIAGNOSIS — R079 Chest pain, unspecified: Secondary | ICD-10-CM | POA: Diagnosis not present

## 2021-12-14 DIAGNOSIS — I4891 Unspecified atrial fibrillation: Secondary | ICD-10-CM | POA: Diagnosis not present

## 2021-12-14 DIAGNOSIS — I10 Essential (primary) hypertension: Secondary | ICD-10-CM | POA: Diagnosis not present

## 2021-12-14 DIAGNOSIS — R002 Palpitations: Secondary | ICD-10-CM | POA: Diagnosis not present

## 2021-12-14 DIAGNOSIS — R55 Syncope and collapse: Secondary | ICD-10-CM | POA: Diagnosis not present

## 2021-12-14 DIAGNOSIS — R0602 Shortness of breath: Secondary | ICD-10-CM | POA: Diagnosis not present

## 2021-12-14 DIAGNOSIS — I2510 Atherosclerotic heart disease of native coronary artery without angina pectoris: Secondary | ICD-10-CM | POA: Diagnosis not present

## 2021-12-14 DIAGNOSIS — E782 Mixed hyperlipidemia: Secondary | ICD-10-CM | POA: Diagnosis not present

## 2021-12-21 DIAGNOSIS — I2510 Atherosclerotic heart disease of native coronary artery without angina pectoris: Secondary | ICD-10-CM | POA: Diagnosis not present

## 2021-12-21 DIAGNOSIS — E669 Obesity, unspecified: Secondary | ICD-10-CM | POA: Diagnosis not present

## 2021-12-21 DIAGNOSIS — R079 Chest pain, unspecified: Secondary | ICD-10-CM | POA: Diagnosis not present

## 2021-12-21 DIAGNOSIS — E782 Mixed hyperlipidemia: Secondary | ICD-10-CM | POA: Diagnosis not present

## 2021-12-21 DIAGNOSIS — R072 Precordial pain: Secondary | ICD-10-CM | POA: Diagnosis not present

## 2021-12-21 DIAGNOSIS — I10 Essential (primary) hypertension: Secondary | ICD-10-CM | POA: Diagnosis not present

## 2021-12-21 DIAGNOSIS — I340 Nonrheumatic mitral (valve) insufficiency: Secondary | ICD-10-CM | POA: Diagnosis not present

## 2021-12-21 DIAGNOSIS — I4891 Unspecified atrial fibrillation: Secondary | ICD-10-CM | POA: Diagnosis not present

## 2021-12-29 DIAGNOSIS — R079 Chest pain, unspecified: Secondary | ICD-10-CM | POA: Diagnosis not present

## 2022-01-02 ENCOUNTER — Emergency Department
Admission: EM | Admit: 2022-01-02 | Discharge: 2022-01-02 | Disposition: A | Discharge status: Home / Self Care | Payer: Medicare HMO | Attending: Emergency Medicine | Admitting: Emergency Medicine

## 2022-01-02 ENCOUNTER — Other Ambulatory Visit: Payer: Self-pay

## 2022-01-02 ENCOUNTER — Emergency Department: Payer: Medicare HMO

## 2022-01-02 DIAGNOSIS — Z1152 Encounter for screening for COVID-19: Secondary | ICD-10-CM | POA: Insufficient documentation

## 2022-01-02 DIAGNOSIS — J069 Acute upper respiratory infection, unspecified: Secondary | ICD-10-CM | POA: Diagnosis not present

## 2022-01-02 DIAGNOSIS — Z7901 Long term (current) use of anticoagulants: Secondary | ICD-10-CM | POA: Diagnosis not present

## 2022-01-02 DIAGNOSIS — J101 Influenza due to other identified influenza virus with other respiratory manifestations: Secondary | ICD-10-CM

## 2022-01-02 DIAGNOSIS — I4891 Unspecified atrial fibrillation: Secondary | ICD-10-CM | POA: Insufficient documentation

## 2022-01-02 DIAGNOSIS — R059 Cough, unspecified: Secondary | ICD-10-CM | POA: Diagnosis not present

## 2022-01-02 DIAGNOSIS — J4521 Mild intermittent asthma with (acute) exacerbation: Secondary | ICD-10-CM | POA: Insufficient documentation

## 2022-01-02 DIAGNOSIS — B9789 Other viral agents as the cause of diseases classified elsewhere: Secondary | ICD-10-CM | POA: Diagnosis not present

## 2022-01-02 LAB — RESP PANEL BY RT-PCR (RSV, FLU A&B, COVID)  RVPGX2
Influenza A by PCR: POSITIVE — AB
Influenza B by PCR: NEGATIVE
Resp Syncytial Virus by PCR: NEGATIVE
SARS Coronavirus 2 by RT PCR: NEGATIVE

## 2022-01-02 LAB — COMPREHENSIVE METABOLIC PANEL
ALT: 20 U/L (ref 0–44)
AST: 34 U/L (ref 15–41)
Albumin: 4 g/dL (ref 3.5–5.0)
Alkaline Phosphatase: 60 U/L (ref 38–126)
Anion gap: 12 (ref 5–15)
BUN: 21 mg/dL (ref 8–23)
CO2: 22 mmol/L (ref 22–32)
Calcium: 9.6 mg/dL (ref 8.9–10.3)
Chloride: 104 mmol/L (ref 98–111)
Creatinine, Ser: 0.93 mg/dL (ref 0.44–1.00)
GFR, Estimated: >60 mL/min (ref >60)
Glucose, Bld: 96 mg/dL (ref 70–99)
Potassium: 4.1 mmol/L (ref 3.5–5.1)
Sodium: 138 mmol/L (ref 135–145)
Total Bilirubin: 0.9 mg/dL (ref 0.3–1.2)
Total Protein: 7.4 g/dL (ref 6.5–8.1)

## 2022-01-02 LAB — CBC WITH DIFFERENTIAL/PLATELET
Abs Immature Granulocytes: 0.01 10*3/uL (ref 0.00–0.07)
Basophils Absolute: 0 10*3/uL (ref 0.0–0.1)
Basophils Relative: 1 %
Eosinophils Absolute: 0.2 10*3/uL (ref 0.0–0.5)
Eosinophils Relative: 5 %
HCT: 47.8 % — ABNORMAL HIGH (ref 36.0–46.0)
Hemoglobin: 15.8 g/dL — ABNORMAL HIGH (ref 12.0–15.0)
Immature Granulocytes: 0 %
Lymphocytes Relative: 38 %
Lymphs Abs: 1.7 10*3/uL (ref 0.7–4.0)
MCH: 30.8 pg (ref 26.0–34.0)
MCHC: 33.1 g/dL (ref 30.0–36.0)
MCV: 93.2 fL (ref 80.0–100.0)
Monocytes Absolute: 0.7 10*3/uL (ref 0.1–1.0)
Monocytes Relative: 15 %
Neutro Abs: 1.8 10*3/uL (ref 1.7–7.7)
Neutrophils Relative %: 41 %
Platelets: 195 10*3/uL (ref 150–400)
RBC: 5.13 MIL/uL — ABNORMAL HIGH (ref 3.87–5.11)
RDW: 12.7 % (ref 11.5–15.5)
WBC: 4.5 10*3/uL (ref 4.0–10.5)
nRBC: 0 % (ref 0.0–0.2)

## 2022-01-02 MED ORDER — ALBUTEROL SULFATE HFA 108 (90 BASE) MCG/ACT IN AERS: 2.0000 | INHALATION_SPRAY | Freq: Four times a day (QID) | RESPIRATORY_TRACT | 2 refills | Status: DC | PRN

## 2022-01-02 MED ORDER — IPRATROPIUM-ALBUTEROL 0.5-2.5 (3) MG/3ML IN SOLN
9.0000 mL | Freq: Once | RESPIRATORY_TRACT | Status: AC
  Administered 2022-01-02: 9 mL via RESPIRATORY_TRACT
  Filled 2022-01-02: qty 3

## 2022-01-02 MED ORDER — PREDNISONE 50 MG PO TABS: ORAL_TABLET | ORAL | 0 refills | Status: DC

## 2022-01-02 MED ORDER — OSELTAMIVIR PHOSPHATE 75 MG PO CAPS: 75.0000 mg | ORAL_CAPSULE | Freq: Two times a day (BID) | ORAL | 0 refills | Status: AC

## 2022-01-02 MED ORDER — PREDNISONE 20 MG PO TABS
60.0000 mg | ORAL_TABLET | Freq: Once | ORAL | Status: AC
  Administered 2022-01-02: 60 mg via ORAL
  Filled 2022-01-02: qty 3

## 2022-01-02 MED ORDER — ALBUTEROL SULFATE HFA 108 (90 BASE) MCG/ACT IN AERS: 2.0000 | INHALATION_SPRAY | RESPIRATORY_TRACT | Status: DC | PRN

## 2022-01-02 MED ORDER — ALBUTEROL SULFATE (2.5 MG/3ML) 0.083% IN NEBU: 2.5000 mg | INHALATION_SOLUTION | RESPIRATORY_TRACT | 2 refills | Status: DC | PRN

## 2022-01-02 NOTE — ED Provider Notes (Addendum)
[facility] Provider Note    Event Date/Time   First MD Initiated Contact with Patient 01/02/22 1504     (approximate)   History   Cough   HPI  Pamela Daniel is a 71 y.o. female   Past medical history of atrial fibrillation with Eliquis, TIA, asthma, presents to the emergency department with cough and congestion productive sputum for the past 1 week and states that her asthma is getting worse.  She has shortness of breath and wheezing that resolves transiently with her albuterol inhaler but she has since run out.  She denies chest pain.    Triage she stated that she had urge incontinence, this is a chronic longstanding issue that she is seeing a urologist for.  There are no acute changes and she is here for her respiratory complaints.  History was obtained via the patient.      Physical Exam   Triage Vital Signs: ED Triage Vitals  Enc Vitals Group     BP 01/02/22 1419 (!) 99/52     Pulse Rate 01/02/22 1419 76     Resp 01/02/22 1419 20     Temp 01/02/22 1419 98.4 F (36.9 C)     Temp Source 01/02/22 1419 Oral     SpO2 01/02/22 1419 95 %     Weight --      Height --      Head Circumference --      Peak Flow --      Pain Score 01/02/22 1421 2     Pain Loc --      Pain Edu? --      Excl. in [place]? --     Most recent vital signs: Vitals:   01/02/22 1419  BP: (!) 99/52  Pulse: 76  Resp: 20  Temp: 98.4 F (36.9 C)  SpO2: 95%    General: Awake, no distress.  CV:  Good peripheral perfusion.  Resp:  Normal effort.  Abd:  No distention.  Other:  Awake alert oriented and pleasant.  No respiratory distress.  She has wheezing bilaterally but no focalities no rales.  There is no fever.   ED Results / Procedures / Treatments   Labs (all labs ordered are listed, but only abnormal results are displayed) Labs Reviewed  RESP PANEL BY RT-PCR (RSV, FLU A&B, COVID)  RVPGX2 - Abnormal; Notable for the following components:      Result Value    Influenza A by PCR POSITIVE (*)    All other components within normal limits  CBC WITH DIFFERENTIAL/PLATELET - Abnormal; Notable for the following components:   RBC 5.13 (*)    Hemoglobin 15.8 (*)    HCT 47.8 (*)    All other components within normal limits  COMPREHENSIVE METABOLIC PANEL     I reviewed labs and they are notable for influenza A positive.   RADIOLOGY I independently reviewed and interpreted chest x-ray and see no obvious focalities or pneumothorax   PROCEDURES:  Critical Care performed: No  Procedures   MEDICATIONS ORDERED IN ED: Medications  ipratropium-albuterol (DUONEB) 0.5-2.5 (3) MG/3ML nebulizer solution 9 mL (has no administration in time range)  predniSONE (DELTASONE) tablet 60 mg (has no administration in time range)     IMPRESSION / MDM / ASSESSMENT AND PLAN / ED COURSE  I reviewed the triage vital signs and the nursing notes.  Differential diagnosis includes, but is not limited to, viral infection, asthma exacerbation, pneumonia, PE, ACS, HF exacerbation    MDM: This patient is influenza positive and has evidence of asthma exacerbation.  Will treat with DuoNebs and steroids.  Tamiflu prescription.  Otherwise appears well, doubt ACS, PE, heart failure.  No evidence of bacterial pneumonia on chest x-ray.  Otherwise well-appearing no respiratory distress nontoxic-appearing doubt sepsis    Patient's presentation is most consistent with acute presentation with potential threat to life or bodily function.       FINAL CLINICAL IMPRESSION(S) / ED DIAGNOSES   Final diagnoses:  Viral URI with cough  Mild intermittent asthma with exacerbation  Influenza A     Rx / DC Orders   ED Discharge Orders          Ordered    oseltamivir (TAMIFLU) 75 MG capsule  2 times daily        01/02/22 1540    predniSONE (DELTASONE) 50 MG tablet        01/02/22 1540    albuterol (VENTOLIN HFA) 108 (90 Base) MCG/ACT inhaler   Every 6 hours PRN        01/02/22 1540    albuterol (PROVENTIL) (2.5 MG/3ML) 0.083% nebulizer solution  Every 4 hours PRN        01/02/22 1540             Note:  This document was prepared using Dragon voice recognition software and may include unintentional dictation errors.    Lucillie Garfinkel, MD 01/02/22 4944    Lucillie Garfinkel, MD 01/02/22 [phone]

## 2022-01-02 NOTE — Discharge Instructions (Addendum)
Take albuterol inhaler or nebulizer every 4-6 hours as needed for shortness of breath and wheezing.  Use prednisone as prescribed.  Stay well-hydrated by drinking plenty of fluids.  Take acetaminophen 650 mg and ibuprofen 400 mg every 6 hours for fever/pain/aches.  Take with food.

## 2022-01-02 NOTE — ED Triage Notes (Signed)
Pt c/o chest congestion x2-3 months. Pt c/o of new onset urinary urgency causing incontinence. Pt took round of antibiotics without improvement in S/S. Pt reports productive cough. Pt is AOX4, cough noted, audible wheeze noted bilaterally.

## 2022-01-02 NOTE — ED Provider Triage Note (Signed)
Emergency Medicine Provider Triage Evaluation Note  Pamela Daniel , a 71 y.o. female  was evaluated in triage.  Pt complains of cough x 3 weeks not improved after antibiotics.   Review of Systems  Positive: Congestion and cough Negative:   Physical Exam  There were no vitals taken for this visit. Gen:   Awake, no distress  audible wheezing.  Resp:  Normal effort, bilateral expiratory wheezes MSK:   Moves extremities without difficulty  Other:    Medical Decision Making  Medically screening exam initiated at 2:20 PM.  Appropriate orders placed.  MORENIKE CUFF was informed that the remainder of the evaluation will be completed by another provider, this initial triage assessment does not replace that evaluation, and the importance of remaining in the ED until their evaluation is complete.     Johnn Hai, PA-C 01/02/22 1427

## 2022-01-04 DIAGNOSIS — R079 Chest pain, unspecified: Secondary | ICD-10-CM | POA: Diagnosis not present

## 2022-01-04 DIAGNOSIS — E669 Obesity, unspecified: Secondary | ICD-10-CM | POA: Diagnosis not present

## 2022-01-04 DIAGNOSIS — I10 Essential (primary) hypertension: Secondary | ICD-10-CM | POA: Diagnosis not present

## 2022-01-04 DIAGNOSIS — I2510 Atherosclerotic heart disease of native coronary artery without angina pectoris: Secondary | ICD-10-CM | POA: Diagnosis not present

## 2022-01-04 DIAGNOSIS — I4891 Unspecified atrial fibrillation: Secondary | ICD-10-CM | POA: Diagnosis not present

## 2022-01-04 DIAGNOSIS — I340 Nonrheumatic mitral (valve) insufficiency: Secondary | ICD-10-CM | POA: Diagnosis not present

## 2022-01-04 DIAGNOSIS — E782 Mixed hyperlipidemia: Secondary | ICD-10-CM | POA: Diagnosis not present

## 2022-01-06 ENCOUNTER — Telehealth: Payer: Self-pay

## 2022-01-06 NOTE — Telephone Encounter (Signed)
        Patient  visited [facility] on 12/16    Telephone encounter attempt : 1st   A HIPAA compliant voice message was left requesting a return call.  Instructed patient to call back.    [place], Care Management  [phone] [address], [place], [place] [address] Phone: [phone] Email: [email or web address]

## 2022-01-07 ENCOUNTER — Telehealth: Payer: Self-pay

## 2022-01-07 NOTE — Telephone Encounter (Signed)
        Patient  visited [facility] on 12/16    Telephone encounter attempt :  2nd  A HIPAA compliant voice message was left requesting a return call.  Instructed patient to call back.    [place], Care Management  [phone] [address], [place], [place] [address] Phone: [phone] Email: [email or web address]

## 2022-01-15 DIAGNOSIS — R079 Chest pain, unspecified: Secondary | ICD-10-CM | POA: Diagnosis not present

## 2022-01-15 DIAGNOSIS — I340 Nonrheumatic mitral (valve) insufficiency: Secondary | ICD-10-CM | POA: Diagnosis not present

## 2022-01-15 DIAGNOSIS — I4891 Unspecified atrial fibrillation: Secondary | ICD-10-CM | POA: Diagnosis not present

## 2022-01-15 DIAGNOSIS — E669 Obesity, unspecified: Secondary | ICD-10-CM | POA: Diagnosis not present

## 2022-01-15 DIAGNOSIS — E782 Mixed hyperlipidemia: Secondary | ICD-10-CM | POA: Diagnosis not present

## 2022-01-15 DIAGNOSIS — I10 Essential (primary) hypertension: Secondary | ICD-10-CM | POA: Diagnosis not present

## 2022-01-15 DIAGNOSIS — I2510 Atherosclerotic heart disease of native coronary artery without angina pectoris: Secondary | ICD-10-CM | POA: Diagnosis not present

## 2022-02-01 ENCOUNTER — Encounter: Payer: Self-pay | Admitting: Gastroenterology

## 2022-03-03 ENCOUNTER — Ambulatory Visit (INDEPENDENT_AMBULATORY_CARE_PROVIDER_SITE_OTHER): Payer: Medicare HMO | Admitting: Gastroenterology

## 2022-03-03 ENCOUNTER — Encounter: Payer: Self-pay | Admitting: Gastroenterology

## 2022-03-03 VITALS — BP 110/63 | HR 74 | Ht 61.0 in | Wt 179.0 lb

## 2022-03-03 DIAGNOSIS — Z86010 Personal history of colonic polyps: Secondary | ICD-10-CM | POA: Diagnosis not present

## 2022-03-03 DIAGNOSIS — Z9889 Other specified postprocedural states: Secondary | ICD-10-CM | POA: Diagnosis not present

## 2022-03-03 DIAGNOSIS — R1319 Other dysphagia: Secondary | ICD-10-CM | POA: Diagnosis not present

## 2022-03-03 DIAGNOSIS — Z7902 Long term (current) use of antithrombotics/antiplatelets: Secondary | ICD-10-CM | POA: Diagnosis not present

## 2022-03-03 MED ORDER — NA SULFATE-K SULFATE-MG SULF 17.5-3.13-1.6 GM/177ML PO SOLN
1.0000 | Freq: Once | ORAL | 0 refills | Status: AC
Start: 2022-03-03 — End: 2022-03-03

## 2022-03-03 NOTE — Progress Notes (Signed)
[facility] Consult Note:  History: Pamela Daniel 03/03/2022  Referring provider: Velna Hatchet, MD  Reason for consult/chief complaint: Colonoscopy (Pt is doing well today, pt is here to discuss having an colonoscopy)   Subjective  HPI: Summary of GI issues: Colonoscopy with Dr. Loletha Carrow February 2021 for family history of colon cancer (mother) and personal history of polyps (2 tubular adenomas in 2016).  5 subcentimeter tubular adenoma without HGD, 1 subcentimeter SSP without dysplasia.  Abdominal bloating  Today, she states that she has been doing well overall from a GI perspective. Her abdominal bloating and discomfort has resolved. However, her bowels seem to be irregular, fluctuating between hard and soft stools. She is walking in her yard daily and does her house work without difficulty. She does have some shortness of breath if she overexerts herself. She is eating plenty of fruits and vegetables. She denies any bloody stools or rectal bleeding.  Patient reports feeling like it is difficult to get some solids and liquids down at times. She drank milk this morning and felt like it was stuck in her chest. She had a fundoplication surgery many years ago with similar symptoms prior to the procedure.  She reminds [place] that her mother and brother had colon cancer.  She reports that she was dealing with palpitations secondary to PVCs which were noted on a recent heart monitor. She states that x1 month ago she was experiencing episodes of dizziness and fatigue.  She calls them "TIAs", but this is uncertain and she was not hospitalized or seen in the ED for this. She was taking Sotalol 276m daily which resulted in low blood pressures which was believed to be the cause of her symptoms. Dr. SNeoma Lamingreduced her Sotalol to 1278mdaily. She has been feeling much better and has been more active since her blood pressure has improved. She has not yet followed up with  cardiology since this medication change. She is unsure of why she is on Eliquis. She had an echocardiogram in 01/2022 which she states was normal. She had a left heart cath in 2021.  [facility] though with no detailed description of findings, only diagrams.  She recalls no interventions done and that her ejection fraction was normal.   ROS:  Review of Systems  Constitutional:  Negative for appetite change, fatigue and fever.  HENT:  Positive for trouble swallowing.   Respiratory:  Negative for cough and shortness of breath.   Cardiovascular:  Negative for chest pain.  Gastrointestinal:  Positive for constipation and diarrhea. Negative for abdominal distention, abdominal pain, anal bleeding, blood in stool, nausea, rectal pain and vomiting.  Genitourinary:  Negative for dysuria.  Musculoskeletal:  Negative for back pain.  Skin:  Negative for rash.  Neurological:  Negative for dizziness, syncope, weakness and light-headedness.  All other systems reviewed and are negative.    Past Medical History: Past Medical History:  Diagnosis Date   Anxiety    Arthritis    SPINE   Asthma    Blood transfusion without reported diagnosis    Fatty liver    Fatty pancreas    First degree heart block    Frequency of urination    GERD (gastroesophageal reflux disease)    Hemorrhoids    Hepatic steatosis    Hiatal hernia    POST RESIDUAL SMALL HH PER IMAGING   History of acute pancreatitis 03/16/2015   History of adenomatous polyp  of colon    tubular adenoma's 04-08-2014/   hyperplastic bening polyp 2000   History of concussion 2012   no residual   History of kidney stones    History of transient ischemic attack (TIA) 12/22/2011   no residual   History of vaginal dysplasia 2016   Hypertension    Pancreatic cyst    Pseudocyst of pancreas    PSVT (paroxysmal supraventricular tachycardia)    cardioloigst-  dr Einar Gip--- last visit 2013 per pt and is  currently followed by pcp   Rectal bleeding    S/P AV (atrioventricular) nodal ablation 01-21-2010    dr Caryl Comes   Simple renal cyst    bilateral per imaging   Urgency of urination    Urinary frequency    Urinary leakage    This patient's cardiologist is outside this health system and office notes cannot be accessed.  Had a cardiac catheterization December 2021.  Past Surgical History: Past Surgical History:  Procedure Laterality Date   BREAST EXCISIONAL BIOPSY Right    CARDIAC ELECTROPHYSIOLOGY MAPPING AND ABLATION  01-21-2010   dr Caryl Comes   ablation AVNRT   FOOT NEUROMA SURGERY Left 1996   HEMORRHOID SURGERY N/A 10/28/2016   Procedure: HEMORRHOIDECTOMY AND HEMORRHOIDAL PEXY;  Surgeon: Leighton Ruff, MD;  Location: [place];  Service: General;  Laterality: N/A;   LAPAROSCOPY TAKEDOWN AND REPAIR HIATAL HERNIA /  NISSEN FUNDOPLATION  09-01-2005    dr Hassell Done  [facility]   LEFT HEART CATH AND CORONARY ANGIOGRAPHY N/A 01/17/2020   Procedure: LEFT HEART CATH AND CORONARY ANGIOGRAPHY;  Surgeon: Dionisio David, MD;  Location: [place] [facility];  Service: Cardiovascular;  Laterality: N/A;   NASAL SEPTUM SURGERY  1991   TEE WITHOUT CARDIOVERSION  02/15/2012   Procedure: TRANSESOPHAGEAL ECHOCARDIOGRAM (TEE);  Surgeon: Laverda Page, MD;  Location: [facility] ENDOSCOPY;  Service: Cardiovascular;  Laterality: N/A;  normal LV, normal EF, mild MR, trace TR, trace PI   TRANSTHORACIC ECHOCARDIOGRAM  12-22-2011    dr Einar Gip   normal echo   [place]   w/  BSO     Family History: Family History  Problem Relation Age of Onset   Heart disease Mother 49       Heart failure >> death at age 72   Dementia Mother    Hypertension Mother    Breast cancer Mother 22   Colon cancer Mother    Heart disease Father 93       MI   COPD Father    Heart disease Brother    Mental illness Brother    Lung cancer Brother 35   Bone cancer Brother    Lung cancer Brother    Bone cancer  Brother    Colon cancer Brother    Breast cancer Maternal Aunt    Breast cancer Cousin    Esophageal cancer Neg Hx    Stomach cancer Neg Hx    Rectal cancer Neg Hx     Social History: Social History   Socioeconomic History   Marital status: Divorced    Spouse name: Not on file   Number of children: 3   Years of education: Not on file   Highest education level: Not on file  Occupational History   Not on file  Tobacco Use   Smoking status: Never   Smokeless tobacco: Never  Vaping Use   Vaping Use: Never used  Substance and Sexual Activity   Alcohol use: No  Drug use: No   Sexual activity: Yes    Comment: 1st intercourse 72 yo-More than 5 partners  Other Topics Concern   Not on file  Social History Narrative   Retired - previously employed at [employer]   Social Determinants of Radio broadcast assistant Strain: Not on file  Food Insecurity: Not on file  Transportation Needs: Not on file  Physical Activity: Not on file  Stress: Not on file  Social Connections: Not on file    Allergies: No Known Allergies  Outpatient Meds: Current Outpatient Medications  Medication Sig Dispense Refill   acetaminophen (TYLENOL) 500 MG tablet Take 1,000 mg by mouth every 6 (six) hours as needed for moderate pain.     albuterol (PROVENTIL) (2.5 MG/3ML) 0.083% nebulizer solution Take 3 mLs (2.5 mg total) by nebulization every 6 (six) hours as needed for wheezing or shortness of breath. 75 mL 0   albuterol (PROVENTIL) (2.5 MG/3ML) 0.083% nebulizer solution Take 3 mLs (2.5 mg total) by nebulization every 4 (four) hours as needed for wheezing or shortness of breath. 75 mL 2   albuterol (VENTOLIN HFA) 108 (90 Base) MCG/ACT inhaler Inhale 2 puffs into the lungs every 6 (six) hours as needed for wheezing or shortness of breath. 8 g 2   ALPRAZolam (XANAX) 0.5 MG tablet Take 0.5 mg by mouth daily as needed for anxiety.     apixaban (ELIQUIS) 5 MG TABS tablet Take 1 tablet (5 mg  total) by mouth 2 (two) times daily. 60 tablet    budesonide-formoterol (SYMBICORT) 160-4.5 MCG/ACT inhaler Inhale 2 puffs into the lungs daily as needed (Shortness of breath).     fluticasone (FLONASE) 50 MCG/ACT nasal spray Place 2 sprays into both nostrils daily.     losartan (COZAAR) 50 MG tablet Take 50 mg by mouth daily.     Na Sulfate-K Sulfate-Mg Sulf 17.5-3.13-1.6 GM/177ML SOLN Take 1 kit by mouth once for 1 dose. 354 mL 0   nitroGLYCERIN (NITROSTAT) 0.4 MG SL tablet Place 0.4 mg under the tongue every 5 (five) minutes as needed for chest pain.     NON FORMULARY Pt uses a cpap nightly     pantoprazole (PROTONIX) 40 MG tablet Take 40 mg by mouth daily.     rosuvastatin (CRESTOR) 20 MG tablet Take 20 mg by mouth at bedtime.     sotalol (BETAPACE) 120 MG tablet Take 120 mg by mouth 2 (two) times daily. Pt taking 1 a day     spironolactone (ALDACTONE) 25 MG tablet Take 25 mg by mouth daily.     Camphor-Menthol-Methyl Sal (SALONPAS) 3.01-24-08 % PTCH Place 1 patch onto the skin daily as needed (pain). (Patient not taking: Reported on 03/03/2022)     ergocalciferol (VITAMIN D2) 1.25 MG (50000 UT) capsule Take 50,000 Units by mouth once a week. (Patient not taking: Reported on 03/03/2022)     furosemide (LASIX) 20 MG tablet Take 20 mg by mouth. (Patient not taking: Reported on 03/03/2022)     methocarbamol (ROBAXIN) 500 MG tablet Take 1 tablet (500 mg total) by mouth every 6 (six) hours as needed for muscle spasms. (Patient not taking: Reported on 03/03/2022) 90 tablet 0   predniSONE (DELTASONE) 50 MG tablet Take one 50 mg tablet daily for the next 3 days starting on 01/03/2022 (Patient not taking: Reported on 03/03/2022) 3 tablet 0   No current facility-administered medications for this visit.   Facility-Administered Medications Ordered in Other Visits  Medication Dose Route Frequency  Provider Last Rate Last Admin   sodium chloride flush (NS) 0.9 % injection 3 mL  3 mL Intravenous Q12H Neoma Laming  A, MD         ___________________________________________________________________ Objective   Exam:  BP 110/63   Pulse 74   Ht 5' 1"$  (1.549 m)   Wt 179 lb (81.2 kg)   BMI 33.82 kg/m  Wt Readings from Last 3 Encounters:  03/03/22 179 lb (81.2 kg)  02/10/21 188 lb (85.3 kg)  02/06/21 188 lb 1.6 oz (85.3 kg)    General: Well-appearing, steady gait, gets on exam table without difficulty. Eyes: sclera anicteric, no redness ENT: oral mucosa moist without lesions, no cervical or supraclavicular lymphadenopathy CV: RRR, no JVD, no peripheral edema Resp: clear to auscultation bilaterally, normal RR and effort noted GI: soft, no tenderness, with active bowel sounds. No guarding or palpable organomegaly noted. Short, lower midline scar. No hernia. Skin; warm and dry, no rash or jaundice noted Neuro: awake, alert and oriented x 3. Normal gross motor function and fluent speech  Labs:  No recent data for review  We are unable to access her cardiologist's records from this EMR.  Assessment: COLONIC POLYPS, HX OF - Plan: Ambulatory referral to Gastroenterology  Esophageal dysphagia - Plan: Ambulatory referral to Gastroenterology  History of fundoplication  Long term (current) use of antithrombotics/antiplatelets   Due for surveillance colonoscopy.  She was agreeable to the procedure after discussion of procedure and risks.  The benefits and risks of the planned procedure were described in detail with the patient or (when appropriate) their health care proxy.  Risks were outlined as including, but not limited to, bleeding, infection, perforation, adverse medication reaction leading to cardiac or pulmonary decompensation, pancreatitis (if ERCP).  The limitation of incomplete mucosal visualization was also discussed.  No guarantees or warranties were given.  Eliquis would need to be held with last dose typically 2 evenings prior to procedure.  All this is assuming that her cardiologist  feels her cardiovascular status is stable after recent medicine adjustments and with the recent event she described.  I suspect she may have paroxysmal A-fib is the reason for anticoagulation, but we really do not know for sure without the available records.  It sounds like she was having presyncopal events from hypotension, now says she feels completely back to normal after the decrease of sotalol dose.  Nevertheless, we need recent cardiology records, and she also needs to follow-up with them in the next 2 to 3 weeks to document improvement in symptoms and blood pressure for her cardiologist and for anesthesia preprocedure planning.  Dysphagia chronic, previous fundoplication.  She recalls having an esophageal dilation in the past, and the last EGD report I can find was with Dr. Sharlett Iles in 2007.  This predates her fundoplication, report describes a hiatal hernia and stricture requiring bougie dilation. Not clear for current issues stricture, recurrence of hiatal hernia with distal esophageal tortuosity and anatomic distortion and/or motility disorder.  Plan: Will obtain recent cardiology office visit notes and echocardiogram report. She will follow up with cardiology and send follow up visit note to [place] as well. Colonoscopy, Endoscopy with possible dilation.  Given the issues noted above, we booked this out about 2 months giving a sufficient time for her to follow-up with cardiology, and for [place] to obtain cardiology records to ensure clearance for anesthesia and ability to briefly hold Eliquis.   Thank you for the courtesy of this consult.  Please call me with  any questions or concerns.  Carleene Cooper III, MD, have reviewed all documentation for this visit. The documentation on 03/03/22 for the exam, diagnosis, procedures, and orders are all accurate and complete.  CC: Referring provider noted above  35 minutes were spent on this encounter (including chart review,  history/exam, counseling/coordination of care, and documentation) > 50% of that time was spent on counseling and coordination of care.   I,Alexis Herring,acting as a Education administrator for [place], MD.,have documented all relevant documentation on the behalf of Doran Stabler, MD,as directed by  Doran Stabler, MD while in the presence of Doran Stabler, MD.

## 2022-03-03 NOTE — Progress Notes (Deleted)
[facility] Consult Note:  History: Pamela Daniel 03/03/2022  Referring provider: Velna Hatchet, MD  Reason for consult/chief complaint: No chief complaint on file.   Subjective  HPI: Summary of GI issues: Colonoscopy with Dr. Loletha Carrow February 2021 for family history of colon cancer (mother) and personal history of polyps (2 tubular adenomas in 2016).  5 subcentimeter tubular adenoma without HGD, 1 subcentimeter SSP without dysplasia.  Abdominal bloating  ***   ROS:  Review of Systems   Past Medical History: Past Medical History:  Diagnosis Date   Anxiety    Arthritis    SPINE   Asthma    Blood transfusion without reported diagnosis    Fatty liver    Fatty pancreas    First degree heart block    Frequency of urination    GERD (gastroesophageal reflux disease)    Hemorrhoids    Hepatic steatosis    Hiatal hernia    POST RESIDUAL SMALL HH PER IMAGING   History of acute pancreatitis 03/16/2015   History of adenomatous polyp of colon    tubular adenoma's 04-08-2014/   hyperplastic bening polyp 2000   History of concussion 2012   no residual   History of kidney stones    History of transient ischemic attack (TIA) 12/22/2011   no residual   History of vaginal dysplasia 2016   Hypertension    Pancreatic cyst    Pseudocyst of pancreas    PSVT (paroxysmal supraventricular tachycardia)    cardioloigst-  dr Einar Gip--- last visit 2013 per pt and is currently followed by pcp   Rectal bleeding    S/P AV (atrioventricular) nodal ablation 01-21-2010    dr Caryl Comes   Simple renal cyst    bilateral per imaging   Urgency of urination    Urinary frequency    Urinary leakage    This patient's cardiologist is outside this health system and office notes cannot be accessed.  Had a cardiac catheterization December 2021.  Past Surgical History: Past Surgical History:  Procedure Laterality Date   BREAST EXCISIONAL BIOPSY Right    CARDIAC  ELECTROPHYSIOLOGY MAPPING AND ABLATION  01-21-2010   dr Caryl Comes   ablation AVNRT   FOOT NEUROMA SURGERY Left 1996   HEMORRHOID SURGERY N/A 10/28/2016   Procedure: HEMORRHOIDECTOMY AND HEMORRHOIDAL PEXY;  Surgeon: Leighton Ruff, MD;  Location: [place];  Service: General;  Laterality: N/A;   LAPAROSCOPY TAKEDOWN AND REPAIR HIATAL HERNIA /  NISSEN FUNDOPLATION  09-01-2005    dr Hassell Done  [facility]   LEFT HEART CATH AND CORONARY ANGIOGRAPHY N/A 01/17/2020   Procedure: LEFT HEART CATH AND CORONARY ANGIOGRAPHY;  Surgeon: Dionisio David, MD;  Location: [place] [facility];  Service: Cardiovascular;  Laterality: N/A;   NASAL SEPTUM SURGERY  1991   TEE WITHOUT CARDIOVERSION  02/15/2012   Procedure: TRANSESOPHAGEAL ECHOCARDIOGRAM (TEE);  Surgeon: Laverda Page, MD;  Location: [facility] ENDOSCOPY;  Service: Cardiovascular;  Laterality: N/A;  normal LV, normal EF, mild MR, trace TR, trace PI   TRANSTHORACIC ECHOCARDIOGRAM  12-22-2011    dr Einar Gip   normal echo   VAGINAL HYSTERECTOMY  1996   w/  BSO     Family History: Family History  Problem Relation Age of Onset   Heart disease Mother 66       Heart failure >> death at age 24   Dementia Mother    Hypertension Mother    Breast cancer Mother 82   Colon cancer Mother  Heart disease Father 34       MI   COPD Father    Heart disease Brother    Mental illness Brother    Lung cancer Brother 62   Bone cancer Brother    Lung cancer Brother    Bone cancer Brother    Colon cancer Brother    Breast cancer Maternal Aunt    Breast cancer Cousin    Esophageal cancer Neg Hx    Stomach cancer Neg Hx    Rectal cancer Neg Hx     Social History: Social History   Socioeconomic History   Marital status: Divorced    Spouse name: Not on file   Number of children: 3   Years of education: Not on file   Highest education level: Not on file  Occupational History   Not on file  Tobacco Use   Smoking status: Never   Smokeless tobacco: Never   Vaping Use   Vaping Use: Never used  Substance and Sexual Activity   Alcohol use: No   Drug use: No   Sexual activity: Yes    Comment: 1st intercourse 72 yo-More than 5 partners  Other Topics Concern   Not on file  Social History Narrative   Retired - previously employed at [employer]   Social Determinants of Radio broadcast assistant Strain: Not on file  Food Insecurity: Not on file  Transportation Needs: Not on file  Physical Activity: Not on file  Stress: Not on file  Social Connections: Not on file    Allergies: No Known Allergies  Outpatient Meds: Current Outpatient Medications  Medication Sig Dispense Refill   acetaminophen (TYLENOL) 500 MG tablet Take 1,000 mg by mouth every 6 (six) hours as needed for moderate pain.     albuterol (PROVENTIL) (2.5 MG/3ML) 0.083% nebulizer solution Take 3 mLs (2.5 mg total) by nebulization every 6 (six) hours as needed for wheezing or shortness of breath. 75 mL 0   albuterol (PROVENTIL) (2.5 MG/3ML) 0.083% nebulizer solution Take 3 mLs (2.5 mg total) by nebulization every 4 (four) hours as needed for wheezing or shortness of breath. 75 mL 2   albuterol (VENTOLIN HFA) 108 (90 Base) MCG/ACT inhaler Inhale 2 puffs into the lungs every 6 (six) hours as needed for wheezing or shortness of breath. 8 g 2   ALPRAZolam (XANAX) 0.5 MG tablet Take 0.5 mg by mouth daily as needed for anxiety.     apixaban (ELIQUIS) 5 MG TABS tablet Take 1 tablet (5 mg total) by mouth 2 (two) times daily. 60 tablet    budesonide-formoterol (SYMBICORT) 160-4.5 MCG/ACT inhaler Inhale 2 puffs into the lungs daily as needed (Shortness of breath).     Camphor-Menthol-Methyl Sal (SALONPAS) 3.01-24-08 % PTCH Place 1 patch onto the skin daily as needed (pain).     ergocalciferol (VITAMIN D2) 1.25 MG (50000 UT) capsule Take 50,000 Units by mouth once a week.     fluticasone (FLONASE) 50 MCG/ACT nasal spray Place 2 sprays into both nostrils daily.     furosemide  (LASIX) 20 MG tablet Take 20 mg by mouth.     losartan (COZAAR) 50 MG tablet Take 50 mg by mouth daily.     methocarbamol (ROBAXIN) 500 MG tablet Take 1 tablet (500 mg total) by mouth every 6 (six) hours as needed for muscle spasms. 90 tablet 0   nitroGLYCERIN (NITROSTAT) 0.4 MG SL tablet Place 0.4 mg under the tongue every 5 (five) minutes as needed for  chest pain.     NON FORMULARY Pt uses a cpap nightly     pantoprazole (PROTONIX) 40 MG tablet Take 40 mg by mouth daily.     predniSONE (DELTASONE) 50 MG tablet Take one 50 mg tablet daily for the next 3 days starting on 01/03/2022 3 tablet 0   rosuvastatin (CRESTOR) 20 MG tablet Take 20 mg by mouth at bedtime.     sotalol (BETAPACE) 120 MG tablet Take 120 mg by mouth 2 (two) times daily.     spironolactone (ALDACTONE) 25 MG tablet Take 25 mg by mouth daily.     No current facility-administered medications for this visit.   Facility-Administered Medications Ordered in Other Visits  Medication Dose Route Frequency Provider Last Rate Last Admin   sodium chloride flush (NS) 0.9 % injection 3 mL  3 mL Intravenous Q12H Dionisio David, MD          ___________________________________________________________________ Objective   Exam:  There were no vitals taken for this visit. Wt Readings from Last 3 Encounters:  02/10/21 188 lb (85.3 kg)  02/06/21 188 lb 1.6 oz (85.3 kg)  10/14/20 185 lb (83.9 kg)    General: ***  Eyes: sclera anicteric, no redness ENT: oral mucosa moist without lesions, no cervical or supraclavicular lymphadenopathy CV: ***, no JVD, no peripheral edema Resp: clear to auscultation bilaterally, normal RR and effort noted GI: soft, *** tenderness, with active bowel sounds. No guarding or palpable organomegaly noted. Skin; warm and dry, no rash or jaundice noted Neuro: awake, alert and oriented x 3. Normal gross motor function and fluent speech  Labs:  ***  Radiologic Studies:  ***  Assessment: No diagnosis  found.  ***  Plan:  ***  Thank you for the courtesy of this consult.  Please call me with any questions or concerns.  Nelida Meuse III  CC: Referring provider noted above

## 2022-03-03 NOTE — Patient Instructions (Signed)
_______________________________________________________  If your blood pressure at your visit was 140/90 or greater, please contact your primary care physician to follow up on this.  _______________________________________________________  If you are age 73 or older, your body mass index should be between 23-30. Your Body mass index is 33.82 kg/m. If this is out of the aforementioned range listed, please consider follow up with your Primary Care Provider.  If you are age 63 or younger, your body mass index should be between 19-25. Your Body mass index is 33.82 kg/m. If this is out of the aformentioned range listed, please consider follow up with your Primary Care Provider.   ________________________________________________________  The [facility] providers would like to encourage you to use [facility] to communicate with providers for non-urgent requests or questions.  Due to long hold times on the telephone, sending your provider a message by [facility] may be a faster and more efficient way to get a response.  Please allow 48 business hours for a response.  Please remember that this is for non-urgent requests.  _______________________________________________________  Pamela Daniel have been scheduled for a colonoscopy. Please follow written instructions given to you at your visit today.  Please pick up your prep supplies at the pharmacy within the next 1-3 days. If you use inhalers (even only as needed), please bring them with you on the day of your procedure.

## 2022-03-11 ENCOUNTER — Telehealth: Payer: Self-pay

## 2022-03-11 ENCOUNTER — Ambulatory Visit (INDEPENDENT_AMBULATORY_CARE_PROVIDER_SITE_OTHER): Payer: Medicare HMO | Admitting: Cardiovascular Disease

## 2022-03-11 ENCOUNTER — Encounter: Payer: Self-pay | Admitting: Cardiovascular Disease

## 2022-03-11 VITALS — BP 114/70 | HR 73 | Ht 61.0 in | Wt 175.8 lb

## 2022-03-11 DIAGNOSIS — G459 Transient cerebral ischemic attack, unspecified: Secondary | ICD-10-CM | POA: Diagnosis not present

## 2022-03-11 DIAGNOSIS — K222 Esophageal obstruction: Secondary | ICD-10-CM

## 2022-03-11 DIAGNOSIS — I10 Essential (primary) hypertension: Secondary | ICD-10-CM | POA: Diagnosis not present

## 2022-03-11 DIAGNOSIS — I471 Supraventricular tachycardia, unspecified: Secondary | ICD-10-CM | POA: Diagnosis not present

## 2022-03-11 NOTE — Progress Notes (Signed)
Cardiology Office Note   Date:  03/11/2022   ID:  Pamela, Daniel 10/10/1950, MRN [id number]  PCP:  Velna Hatchet, MD  Cardiologist:  Neoma Laming, MD      History of Present Illness: Pamela Daniel is a 72 y.o. female who presents for  Chief Complaint  Patient presents with   Follow-up    Clearance    Patient in office for cardiac clearance.  Dizziness This is a chronic problem. The current episode started more than 1 month ago. The problem has been resolved.      Past Medical History:  Diagnosis Date   Anxiety    Arthritis    SPINE   Asthma    Blood transfusion without reported diagnosis    Fatty liver    Fatty pancreas    First degree heart block    Frequency of urination    GERD (gastroesophageal reflux disease)    Hemorrhoids    Hepatic steatosis    Hiatal hernia    POST RESIDUAL SMALL HH PER IMAGING   History of acute pancreatitis 03/16/2015   History of adenomatous polyp of colon    tubular adenoma's 04-08-2014/   hyperplastic bening polyp 2000   History of concussion 2012   no residual   History of kidney stones    History of transient ischemic attack (TIA) 12/22/2011   no residual   History of vaginal dysplasia 2016   Hypertension    Pancreatic cyst    Pseudocyst of pancreas    PSVT (paroxysmal supraventricular tachycardia)    cardioloigst-  dr Einar Gip--- last visit 2013 per pt and is currently followed by pcp   Rectal bleeding    S/P AV (atrioventricular) nodal ablation 01-21-2010    dr Caryl Comes   Simple renal cyst    bilateral per imaging   Urgency of urination    Urinary frequency    Urinary leakage      Past Surgical History:  Procedure Laterality Date   BREAST EXCISIONAL BIOPSY Right    CARDIAC ELECTROPHYSIOLOGY MAPPING AND ABLATION  01-21-2010   dr Caryl Comes   ablation AVNRT   FOOT NEUROMA SURGERY Left 1996   HEMORRHOID SURGERY N/A 10/28/2016   Procedure: HEMORRHOIDECTOMY AND HEMORRHOIDAL PEXY;  Surgeon: Leighton Ruff,  MD;  Location: [facility];  Service: General;  Laterality: N/A;   LAPAROSCOPY TAKEDOWN AND REPAIR HIATAL HERNIA /  NISSEN FUNDOPLATION  09-01-2005    dr Hassell Done  [facility]   LEFT HEART CATH AND CORONARY ANGIOGRAPHY N/A 01/17/2020   Procedure: LEFT HEART CATH AND CORONARY ANGIOGRAPHY;  Surgeon: Dionisio David, MD;  Location: [facility];  Service: Cardiovascular;  Laterality: N/A;   NASAL SEPTUM SURGERY  1991   TEE WITHOUT CARDIOVERSION  02/15/2012   Procedure: TRANSESOPHAGEAL ECHOCARDIOGRAM (TEE);  Surgeon: Laverda Page, MD;  Location: [facility];  Service: Cardiovascular;  Laterality: N/A;  normal LV, normal EF, mild MR, trace TR, trace PI   TRANSTHORACIC ECHOCARDIOGRAM  12-22-2011    dr Einar Gip   normal echo   VAGINAL HYSTERECTOMY  1996   w/  BSO     Current Outpatient Medications  Medication Sig Dispense Refill   albuterol (PROVENTIL) (2.5 MG/3ML) 0.083% nebulizer solution Take 3 mLs (2.5 mg total) by nebulization every 6 (six) hours as needed for wheezing or shortness of breath. 75 mL 0   albuterol (PROVENTIL) (2.5 MG/3ML) 0.083% nebulizer solution Take 3 mLs (2.5 mg total) by nebulization every 4 (four) hours  as needed for wheezing or shortness of breath. 75 mL 2   albuterol (VENTOLIN HFA) 108 (90 Base) MCG/ACT inhaler Inhale 2 puffs into the lungs every 6 (six) hours as needed for wheezing or shortness of breath. 8 g 2   ALPRAZolam (XANAX) 0.5 MG tablet Take 0.5 mg by mouth daily as needed for anxiety.     apixaban (ELIQUIS) 5 MG TABS tablet Take 1 tablet (5 mg total) by mouth 2 (two) times daily. 60 tablet    fluticasone (FLONASE) 50 MCG/ACT nasal spray Place 2 sprays into both nostrils daily.     furosemide (LASIX) 20 MG tablet Take 20 mg by mouth.     losartan (COZAAR) 50 MG tablet Take 50 mg by mouth daily.     nitroGLYCERIN (NITROSTAT) 0.4 MG SL tablet Place 0.4 mg under the tongue every 5 (five) minutes as needed for chest pain.     NON FORMULARY Pt uses a  cpap nightly     pantoprazole (PROTONIX) 40 MG tablet Take 40 mg by mouth daily.     rosuvastatin (CRESTOR) 20 MG tablet Take 20 mg by mouth at bedtime.     sertraline (ZOLOFT) 25 MG tablet Take 25 mg by mouth daily.     sotalol (BETAPACE) 120 MG tablet Take 120 mg by mouth 2 (two) times daily. Pt taking 1 a day     spironolactone (ALDACTONE) 25 MG tablet Take 25 mg by mouth daily.     No current facility-administered medications for this visit.   Facility-Administered Medications Ordered in Other Visits  Medication Dose Route Frequency Provider Last Rate Last Admin   sodium chloride flush (NS) 0.9 % injection 3 mL  3 mL Intravenous Q12H Dionisio David, MD        Allergies:   Patient has no known allergies.    Social History:   reports that she has never smoked. She has never used smokeless tobacco. She reports that she does not drink alcohol and does not use drugs.   Family History:  family history includes Bone cancer in her brother and brother; Breast cancer in her cousin and maternal aunt; Breast cancer (age of onset: 73) in her mother; COPD in her father; Colon cancer in her brother and mother; Dementia in her mother; Heart disease in her brother; Heart disease (age of onset: 41) in her mother; Heart disease (age of onset: 32) in her father; Hypertension in her mother; Lung cancer in her brother; Lung cancer (age of onset: 65) in her brother; Mental illness in her brother.    ROS:     Review of Systems  Constitutional: Negative.   HENT: Negative.    Eyes: Negative.   Respiratory: Negative.    Gastrointestinal: Negative.   Genitourinary: Negative.   Musculoskeletal: Negative.   Skin: Negative.   Neurological:  Positive for dizziness.  Endo/Heme/Allergies: Negative.   Psychiatric/Behavioral: Negative.    All other systems reviewed and are negative.   All other systems are reviewed and negative.   PHYSICAL EXAM: VS:  BP 114/70   Pulse 73   Ht 5' 1"$  (1.549 m)   Wt 175 lb  12.8 oz (79.7 kg)   SpO2 94%   BMI 33.22 kg/m  , BMI Body mass index is 33.22 kg/m. Last weight:  Wt Readings from Last 3 Encounters:  03/11/22 175 lb 12.8 oz (79.7 kg)  03/03/22 179 lb (81.2 kg)  02/10/21 188 lb (85.3 kg)    Physical Exam Constitutional:  Appearance: Normal appearance.  Cardiovascular:     Rate and Rhythm: Normal rate and regular rhythm.     Heart sounds: Normal heart sounds.  Pulmonary:     Effort: Pulmonary effort is normal.     Breath sounds: Normal breath sounds.  Musculoskeletal:     Right lower leg: No edema.     Left lower leg: No edema.  Neurological:     Mental Status: She is alert.      EKG: none today  Recent Labs: 01/02/2022: ALT 20; BUN 21; Creatinine, Ser 0.93; Hemoglobin 15.8; Platelets 195; Potassium 4.1; Sodium 138    Lipid Panel    Component Value Date/Time   CHOL 176 03/16/2015 2124   TRIG 92 03/16/2015 2124   HDL 61 03/16/2015 2124   CHOLHDL 2.9 03/16/2015 2124   VLDL 18 03/16/2015 2124   LDLCALC 97 03/16/2015 2124      Other studies Reviewed: Patient: [id number] - Hamilton Capri DOB:  [birth year] SSN:    Date:  12/29/2021 11:00 Provider: Neoma Laming MD Encounter: ECHO   Page 2 REASON FOR VISIT  Visit for: Echocardiogram/R07.9  Sex:   Female  wt= 179   lbs.  BP=128/70  Height= 61   inches.        TESTS  Imaging: Echocardiogram:  An echocardiogram in (2-d) mode was performed and in Doppler mode with color flow velocity mapping was performed. The aortic valve cusps are abnormal 1.8  cm, flow velocity 1.23    m/s, and systolic calculated mean flow gradient 3  mmHg. Mitral valve diastolic peak flow velocity E .60   m/s and E/A ratio 1.7. Aortic root diameter 3.1  cm. The LVOT internal diameter 3.1 cm and flow velocity was abnormal 1.12    m/s. LV systolic dimension [id number]   cm, diastolic [id number]  cm, posterior wall thickness 1.04   cm, fractional shortening 39.3 %, and EF 70.6 %. IVS thickness 0.882   cm. LA  dimension 3.7 cm. Mitral Valve has Trace Regurgitation. Tricuspid Valve has Trace Regurgitation.     ASSESSMENT  Technically difficult study due to body habitus.  Normal chamber sizes.  Normal left ventricular systolic function.  Normal right ventricular systolic function.  Normal right ventricular diastolic function.  Normal left ventricular wall motion.  Normal right ventricular wall motion.  Trace tricuspid regurgitation.  Normal pulmonary artery pressure.  Trace mitral regurgitation.  No pericardial effusion.  Mild LVH.     THERAPY   Referring physician: Ardeth Sportsman A. Tejan-Sie  Chelsa Wallie Char  Sonographer:   Neoma Laming MD  Electronically signed by: Neoma Laming     Date: 12/29/2021 15:08  Patient: [id number] - Hamilton Capri DOB:  May 30, 1950 SSN:    Date:  12/21/2021 09:30 Provider: Neoma Laming MD Encounter: ALL ANGIOGRAMS (CTA BRAIN, CAROTIDS, RENAL ARTERIES, PE)   Page 1 REASON FOR VISIT  Referred by Dr.Denyse Fillion Humphrey Rolls.        TESTS  Imaging: Computed Tomographic Angiography:  Cardiac multidetector CT was performed paying particular attention to the coronary arteries for the diagnosis of: Chest Pain. Diagnostic Drugs:  Administered iohexol (Omnipaque) through an antecubital vein and images from the examination were analyzed for the presence and extent of coronary artery disease, using 3D image processing software. 100 mL of non-ionic contrast (Omnipaque) was used.     TEST CONCLUSIONS  Quality of study: Suboptimal/Poor.  1-Calcium score: 94.5  2-Right dominant system.  3-Mid  LAD mild disease, Mid RCA moderate disease, and LCX has no significant disease.   Neoma Laming MD  Electronically signed by: Neoma Laming     Date: 12/23/2021 09:02  Patient: [id number] - Hamilton Capri DOB:  1950-09-10 SSN:    Date:  10/31/2020 08:00 Provider: Neoma Laming MD Encounter: NUCLEAR STRESS TEST   Page  1 TESTS                                                                                          [facility] [address] [place], [place] [address] [phone] STUDY:  Gated Stress / Rest Myocardial Perfusion Imaging Tomographic (SPECT) Including attenuation correction Wall Motion, Left Ventricular Ejection Fraction By Gated Technique.Treadmill Stress Test. SEX: Female  WEIGHT: 183 lbs  HEIGHT: 61 in    ARMS UP: YES/NO                                                                        REFERRING PHYSICIAN: Dr.Briahnna Harries Humphrey Rolls                                                                                                                                                                                                                      INDICATION FOR STUDY: SOB  TECHNIQUE:  Approximately 20 minutes following the intravenous administration of 9.5 mCi of Tc-75mSestamibi after stress testing in a reclined supine position with arms above their head if able to do so, gated SPECT imaging of the heart was performed. After about a 2hr break, the patient was injected intravenously with 32.3 mCi of Tc-949mestamibi.  Approximately 45 minutes later in the same position as stress imaging SPECT rest imaging of the heart was performed.  STRESS BY:  ShNeoma LamingMD PROTOCOL: BrDarnell Level                                                                                       MAX PRED HR: 149                     85%: 127               75%: 112                                                                                                                   RESTING BP: 132/72 RESTING HR: 64  PEAK BP: 150/78  PEAK HR: 134 (89%)                                                                     EXERCISE DURATION: 4:01                                             METS: 5.8     REASON FOR TEST TERMINATION: SOB.                                                                                                                                 SYMPTOMS:  SOB. [facility]KE TREADMILL SCORE: 4                                       RISK:  Low                                                                                                                                                                                                          EKG RESULTS: NSR. 61/min. WNL. No significant ST changes at peak exercise.                                                              IMAGE QUALITY: Good  PERFUSION/WALL MOTION FINDINGS: EF = 91%. No perfusion defects, normal wall motion.                                                                           IMPRESSION:  Normal stress test with normal LVEF.                                                                                                                                                                                                                                                                                        Neoma Laming, MD Stress Interpreting Physician / Nuclear Interpreting Physician        Neoma Laming MD  Electronically signed by: Neoma Laming     Date: 11/04/2020 09:46    ASSESSMENT AND PLAN:    ICD-10-CM   1. Essential hypertension  I10     2. SVT/ PSVT/ PAT  I47.10     3. TIA (transient ischemic attack)  G45.9     4. ESOPHAGEAL STRICTURE  K22.2        Problem List Items Addressed This Visit        Cardiovascular and Mediastinum   Essential hypertension - Primary   SVT/ PSVT/ PAT    On sotalol 120 mg dosage,denies any dizziness. Has been stable. May stop eliquis 3 days prior to endoscopy, and restart next day.      TIA (transient ischemic attack)     Digestive   ESOPHAGEAL STRICTURE     Advise proceeding with endoscopy.  Disposition:   Return in about 3 months (around 06/09/2022).    Total time spent: 30 minutes  Signed,  Neoma Laming, MD  03/11/2022 10:58 AM    [place]

## 2022-03-11 NOTE — Telephone Encounter (Signed)
Patient has been notified and aware to hold her Eliquis.

## 2022-03-11 NOTE — Telephone Encounter (Signed)
-----   Message from [place], MD sent at 03/11/2022  1:22 PM EST ----- Pamela Daniel,  This patient was recently seen in clinic and scheduled for procedures with me in the [facility].  Her cardiologist sent me their office note and cleared patient for procedures.  They authorized holding Eliquis 3 days prior to procedures, but it only needs to be held two evenings prior per our usual instructions.  - H. Danis

## 2022-03-11 NOTE — Assessment & Plan Note (Signed)
On sotalol 120 mg dosage,denies any dizziness. Has been stable. May stop eliquis 3 days prior to endoscopy, and restart next day.

## 2022-03-24 ENCOUNTER — Other Ambulatory Visit: Payer: Self-pay | Admitting: Cardiovascular Disease

## 2022-03-24 DIAGNOSIS — G459 Transient cerebral ischemic attack, unspecified: Secondary | ICD-10-CM

## 2022-03-24 DIAGNOSIS — I10 Essential (primary) hypertension: Secondary | ICD-10-CM

## 2022-03-24 DIAGNOSIS — I471 Supraventricular tachycardia, unspecified: Secondary | ICD-10-CM

## 2022-03-24 DIAGNOSIS — R601 Generalized edema: Secondary | ICD-10-CM

## 2022-03-25 ENCOUNTER — Telehealth: Payer: Self-pay | Admitting: Gastroenterology

## 2022-03-25 NOTE — Telephone Encounter (Signed)
Inbound call from patient's daughter, stated she wanted to move her procedure up because her mother had an episode where she felt like she was choking but she hadnt eaten anything. Advised daughter that she was not scheduled for a double procedure but only a colonoscopy. Patient requested clarity due to thinking she was scheduled for a double procedure. Please advise.

## 2022-03-25 NOTE — Telephone Encounter (Signed)
Patient returning call. Please advise

## 2022-03-25 NOTE — Telephone Encounter (Signed)
Left message to return call.  

## 2022-03-26 NOTE — Telephone Encounter (Signed)
Spoke to Pamela Daniel today. She was inquiring about moving up her date of the scheduled EGD/Colon (05-04-22). She explained she had an episode where she felt as if she was chocking, she had been laying in bed when this occurred. She didn't have any symptoms of reflux. She even used her inhaler and this didn't improve the symptoms. This lasted a few minutes and resolved. She believes it may have been a spasm. This hasn't happened again. After reviewing the schedule, we do don't have a sooner date to schedule a double. Please advise

## 2022-03-29 ENCOUNTER — Other Ambulatory Visit: Payer: Self-pay | Admitting: Cardiovascular Disease

## 2022-03-29 NOTE — Telephone Encounter (Signed)
We do not have a sooner appointment for a double procedure.  I think it would be best to do both procedures the same day rather than on separate days due to her need to hold Elquis.  - HD

## 2022-03-29 NOTE — Telephone Encounter (Signed)
Patient agrees with the current plan for the EGD/Colon scheduled for 05-04-2022. She will reach out to [place] if episodes return.

## 2022-04-09 DIAGNOSIS — K047 Periapical abscess without sinus: Secondary | ICD-10-CM | POA: Diagnosis not present

## 2022-04-09 DIAGNOSIS — K0510 Chronic gingivitis, plaque induced: Secondary | ICD-10-CM | POA: Diagnosis not present

## 2022-04-14 DIAGNOSIS — K219 Gastro-esophageal reflux disease without esophagitis: Secondary | ICD-10-CM | POA: Diagnosis not present

## 2022-04-14 DIAGNOSIS — R7989 Other specified abnormal findings of blood chemistry: Secondary | ICD-10-CM | POA: Diagnosis not present

## 2022-04-14 DIAGNOSIS — F419 Anxiety disorder, unspecified: Secondary | ICD-10-CM | POA: Diagnosis not present

## 2022-04-14 DIAGNOSIS — R739 Hyperglycemia, unspecified: Secondary | ICD-10-CM | POA: Diagnosis not present

## 2022-04-14 DIAGNOSIS — E559 Vitamin D deficiency, unspecified: Secondary | ICD-10-CM | POA: Diagnosis not present

## 2022-04-22 DIAGNOSIS — D6869 Other thrombophilia: Secondary | ICD-10-CM | POA: Diagnosis not present

## 2022-04-22 DIAGNOSIS — Z1331 Encounter for screening for depression: Secondary | ICD-10-CM | POA: Diagnosis not present

## 2022-04-22 DIAGNOSIS — M8580 Other specified disorders of bone density and structure, unspecified site: Secondary | ICD-10-CM | POA: Diagnosis not present

## 2022-04-22 DIAGNOSIS — K760 Fatty (change of) liver, not elsewhere classified: Secondary | ICD-10-CM | POA: Diagnosis not present

## 2022-04-22 DIAGNOSIS — Z1339 Encounter for screening examination for other mental health and behavioral disorders: Secondary | ICD-10-CM | POA: Diagnosis not present

## 2022-04-22 DIAGNOSIS — Z7901 Long term (current) use of anticoagulants: Secondary | ICD-10-CM | POA: Diagnosis not present

## 2022-04-22 DIAGNOSIS — L309 Dermatitis, unspecified: Secondary | ICD-10-CM | POA: Diagnosis not present

## 2022-04-22 DIAGNOSIS — R5383 Other fatigue: Secondary | ICD-10-CM | POA: Diagnosis not present

## 2022-04-22 DIAGNOSIS — D696 Thrombocytopenia, unspecified: Secondary | ICD-10-CM | POA: Diagnosis not present

## 2022-04-22 DIAGNOSIS — I10 Essential (primary) hypertension: Secondary | ICD-10-CM | POA: Diagnosis not present

## 2022-04-22 DIAGNOSIS — L989 Disorder of the skin and subcutaneous tissue, unspecified: Secondary | ICD-10-CM | POA: Diagnosis not present

## 2022-04-22 DIAGNOSIS — R21 Rash and other nonspecific skin eruption: Secondary | ICD-10-CM | POA: Diagnosis not present

## 2022-04-22 DIAGNOSIS — E785 Hyperlipidemia, unspecified: Secondary | ICD-10-CM | POA: Diagnosis not present

## 2022-04-22 DIAGNOSIS — Z0000 Encounter for general adult medical examination without abnormal findings: Secondary | ICD-10-CM | POA: Diagnosis not present

## 2022-04-22 DIAGNOSIS — R82998 Other abnormal findings in urine: Secondary | ICD-10-CM | POA: Diagnosis not present

## 2022-05-04 ENCOUNTER — Encounter: Payer: Self-pay | Admitting: Gastroenterology

## 2022-05-04 ENCOUNTER — Ambulatory Visit (AMBULATORY_SURGERY_CENTER): Payer: Medicare HMO | Admitting: Gastroenterology

## 2022-05-04 VITALS — BP 133/60 | HR 56 | Temp 97.5°F | Resp 14 | Ht 61.0 in | Wt 179.0 lb

## 2022-05-04 DIAGNOSIS — Z860100 Personal history of colon polyps, unspecified: Secondary | ICD-10-CM

## 2022-05-04 DIAGNOSIS — R1319 Other dysphagia: Secondary | ICD-10-CM

## 2022-05-04 DIAGNOSIS — Z09 Encounter for follow-up examination after completed treatment for conditions other than malignant neoplasm: Secondary | ICD-10-CM

## 2022-05-04 DIAGNOSIS — D128 Benign neoplasm of rectum: Secondary | ICD-10-CM | POA: Diagnosis not present

## 2022-05-04 DIAGNOSIS — F419 Anxiety disorder, unspecified: Secondary | ICD-10-CM | POA: Diagnosis not present

## 2022-05-04 DIAGNOSIS — D124 Benign neoplasm of descending colon: Secondary | ICD-10-CM | POA: Diagnosis not present

## 2022-05-04 DIAGNOSIS — I10 Essential (primary) hypertension: Secondary | ICD-10-CM | POA: Diagnosis not present

## 2022-05-04 DIAGNOSIS — R1310 Dysphagia, unspecified: Secondary | ICD-10-CM | POA: Diagnosis not present

## 2022-05-04 DIAGNOSIS — K635 Polyp of colon: Secondary | ICD-10-CM | POA: Diagnosis not present

## 2022-05-04 DIAGNOSIS — K760 Fatty (change of) liver, not elsewhere classified: Secondary | ICD-10-CM | POA: Diagnosis not present

## 2022-05-04 DIAGNOSIS — K621 Rectal polyp: Secondary | ICD-10-CM | POA: Diagnosis not present

## 2022-05-04 DIAGNOSIS — D120 Benign neoplasm of cecum: Secondary | ICD-10-CM | POA: Diagnosis not present

## 2022-05-04 DIAGNOSIS — D122 Benign neoplasm of ascending colon: Secondary | ICD-10-CM

## 2022-05-04 MED ORDER — SODIUM CHLORIDE 0.9 % IV SOLN: 500.0000 mL | Freq: Once | INTRAVENOUS | Status: DC

## 2022-05-04 NOTE — Op Note (Signed)
[facility] Patient Name: Pamela Daniel Procedure Date: 05/04/2022 7:34 AM MRN: [id number] Endoscopist: Sherilyn Cooter L. Myrtie Neither , MD, [id number] Age: 72 Referring MD:  Date of Birth: Apr 22, 1950 Gender: Female Account #: [id number] Procedure:                Upper GI endoscopy Indications:              Pharyngo-esophageal phase dysphagia (felt in neck,                            more toward left side)                           prior fundoplication Medicines:                Monitored Anesthesia Care Procedure:                Pre-Anesthesia Assessment:                           - Prior to the procedure, a History and Physical                            was performed, and patient medications and                            allergies were reviewed. The patient's tolerance of                            previous anesthesia was also reviewed. The risks                            and benefits of the procedure and the sedation                            options and risks were discussed with the patient.                            All questions were answered, and informed consent                            was obtained. Prior Anticoagulants: The patient has                            taken Eliquis (apixaban), last dose was 2 days                            prior to procedure. ASA Grade Assessment: III - A                            patient with severe systemic disease. After                            reviewing the risks and benefits, the patient was  deemed in satisfactory condition to undergo the                            procedure.                           After obtaining informed consent, the endoscope was                            passed under direct vision. Throughout the                            procedure, the patient's blood pressure, pulse, and                            oxygen saturations were monitored continuously. The                             Olympus scope [id number] was introduced through the                            mouth, and advanced to the second part of duodenum.                            The upper GI endoscopy was accomplished without                            difficulty. The patient tolerated the procedure                            well. Scope In: Scope Out: Findings:                 The larynx was normal.                           The esophagus was normal.                           Evidence of a fundoplication was found in the                            cardia. The wrap appeared loose. This was traversed.                           The exam of the stomach was otherwise normal.                           The examined duodenum was normal. Complications:            No immediate complications. Estimated Blood Loss:     Estimated blood loss: none. Impression:               - Normal larynx.                           - Normal esophagus.                           -  A fundoplication was found. The wrap appears                            loose.                           - Normal examined duodenum.                           - No specimens collected.                           No stricture or other obstructive cause for                            dysphagia was seen. Sounds like pharyngo-esophageal                            motility issue/presbyesophagus. Recommendation:           - Patient has a contact number available for                            emergencies. The signs and symptoms of potential                            delayed complications were discussed with the                            patient. Return to normal activities tomorrow.                            Written discharge instructions were provided to the                            patient.                           - Resume previous diet.                           - Resume Eliquis (apixaban) at prior dose tomorrow.                           - See the other  procedure note for documentation of                            additional recommendations. Chase Arnall L. Myrtie Neither, MD 05/04/2022 8:43:54 AM This report has been signed electronically.

## 2022-05-04 NOTE — Op Note (Signed)
[facility] Patient Name: Pamela Daniel Procedure Date: 05/04/2022 7:33 AM MRN: [id number] Endoscopist: Sherilyn Cooter L. Myrtie Neither , MD, [id number] Age: 72 Referring MD:  Date of Birth: Apr 13, 1950 Gender: Female Account #: [id number] Procedure:                Colonoscopy Indications:              Surveillance: Personal history of adenomatous                            polyps on last colonoscopy 3 years ago                           February 2021: 5 subcentimeter tubular adenomas and                            1 subcentimeter SSP without dysplasia Medicines:                Monitored Anesthesia Care Procedure:                Pre-Anesthesia Assessment:                           - Prior to the procedure, a History and Physical                            was performed, and patient medications and                            allergies were reviewed. The patient's tolerance of                            previous anesthesia was also reviewed. The risks                            and benefits of the procedure and the sedation                            options and risks were discussed with the patient.                            All questions were answered, and informed consent                            was obtained. Prior Anticoagulants: The patient has                            taken Eliquis (apixaban), last dose was 2 days                            prior to procedure. ASA Grade Assessment: III - A                            patient with severe systemic disease. After  reviewing the risks and benefits, the patient was                            deemed in satisfactory condition to undergo the                            procedure.                           After obtaining informed consent, the colonoscope                            was passed under direct vision. Throughout the                            procedure, the patient's blood pressure, pulse, and                             oxygen saturations were monitored continuously. The                            Olympus CF-HQ190L [id number]) Colonoscope was                            introduced through the anus and advanced to the the                            cecum, identified by appendiceal orifice and                            ileocecal valve. The colonoscopy was performed                            without difficulty. The patient tolerated the                            procedure well. The quality of the bowel                            preparation was excellent. The ileocecal valve,                            appendiceal orifice, and rectum were photographed. Scope In: 8:19:56 AM Scope Out: 8:36:59 AM Scope Withdrawal Time: 0 hours 13 minutes 38 seconds  Total Procedure Duration: 0 hours 17 minutes 3 seconds  Findings:                 The perianal and digital rectal examinations were                            normal.                           A diminutive polyp was found in the cecum. The  polyp was flat. The polyp was removed with a cold                            biopsy forceps. Resection and retrieval were                            complete. (Jar 1)                           Three sessile polyps were found in the proximal                            descending colon and ascending colon. The polyps                            were diminutive in size. These polyps were removed                            with a cold snare. Resection and retrieval were                            complete. (Jar 1)                           A diminutive polyp was found in the distal rectum.                            The polyp was sessile. The polyp was removed with a                            cold snare. Resection and retrieval were complete.                            (Jar 2)                           Repeat examination of right colon under NBI                            performed.                            Internal hemorrhoids were found. The hemorrhoids                            were small.                           Diverticula were found in the left colon and right                            colon.                           The exam was otherwise without abnormality on  direct and retroflexion views. Complications:            No immediate complications. Estimated Blood Loss:     Estimated blood loss was minimal. Impression:               - One diminutive polyp in the cecum, removed with a                            cold biopsy forceps. Resected and retrieved.                           - Three diminutive polyps in the proximal                            descending colon and in the ascending colon,                            removed with a cold snare. Resected and retrieved.                           - One diminutive polyp in the distal rectum,                            removed with a cold snare. Resected and retrieved.                           - Internal hemorrhoids.                           - Diverticulosis in the left colon and in the right                            colon.                           - The examination was otherwise normal on direct                            and retroflexion views.                           - The GI Genius (intelligent endoscopy module),                            computer-aided polyp detection system powered by AI                            was utilized to detect colorectal polyps through                            enhanced visualization during colonoscopy. Recommendation:           - Patient has a contact number available for                            emergencies. The signs and symptoms  of potential                            delayed complications were discussed with the                            patient. Return to normal activities tomorrow.                            Written discharge instructions  were provided to the                            patient.                           - Resume previous diet.                           - Resume Eliquis (apixaban) at prior dose tomorrow.                           - Await pathology results.                           - Repeat colonoscopy is recommended for                            surveillance. The colonoscopy date will be                            determined after pathology results from today's                            exam become available for review.                           - See the other procedure note for documentation of                            additional recommendations. Jadie Comas L. Myrtie Neither, MD 05/04/2022 8:48:45 AM This report has been signed electronically.

## 2022-05-04 NOTE — Progress Notes (Signed)
History and Physical:  This patient presents for endoscopic testing for: Encounter Diagnoses  Name Primary?   Personal history of colonic polyps Yes   Esophageal dysphagia     Clinical details in 03/03/22 clinic note Polyp history: Colonoscopy with Dr. Myrtie Neither February 2021 for family history of colon cancer (mother) and personal history of polyps (2 tubular adenomas in 2016). 5 subcentimeter tubular adenoma without HGD, 1 subcentimeter SSP without dysplasia.   Patient is otherwise without complaints or active issues today.   Past Medical History: Past Medical History:  Diagnosis Date   Anxiety    Arthritis    SPINE   Asthma    Blood transfusion without reported diagnosis    Fatty liver    Fatty pancreas    First degree heart block    Frequency of urination    GERD (gastroesophageal reflux disease)    Hemorrhoids    Hepatic steatosis    Hiatal hernia    POST RESIDUAL SMALL HH PER IMAGING   History of acute pancreatitis 03/16/2015   History of adenomatous polyp of colon    tubular adenoma's 04-08-2014/   hyperplastic bening polyp 2000   History of concussion 2012   no residual   History of kidney stones    History of transient ischemic attack (TIA) 12/22/2011   no residual   History of vaginal dysplasia 2016   Hypertension    Pancreatic cyst    Pseudocyst of pancreas    PSVT (paroxysmal supraventricular tachycardia)    cardioloigst-  dr Jacinto Halim--- last visit 2013 per pt and is currently followed by pcp   Rectal bleeding    S/P AV (atrioventricular) nodal ablation 01-21-2010    dr Graciela Husbands   Simple renal cyst    bilateral per imaging   Urgency of urination    Urinary frequency    Urinary leakage      Past Surgical History: Past Surgical History:  Procedure Laterality Date   BREAST EXCISIONAL BIOPSY Right    CARDIAC ELECTROPHYSIOLOGY MAPPING AND ABLATION  01-21-2010   dr Graciela Husbands   ablation AVNRT   FOOT NEUROMA SURGERY Left 1996   HEMORRHOID SURGERY N/A 10/28/2016    Procedure: HEMORRHOIDECTOMY AND HEMORRHOIDAL PEXY;  Surgeon: Romie Levee, MD;  Location: [facility] ;  Service: General;  Laterality: N/A;   LAPAROSCOPY TAKEDOWN AND REPAIR HIATAL HERNIA /  NISSEN FUNDOPLATION  09-01-2005    dr Daphine Deutscher  [facility]   LEFT HEART CATH AND CORONARY ANGIOGRAPHY N/A 01/17/2020   Procedure: LEFT HEART CATH AND CORONARY ANGIOGRAPHY;  Surgeon: Laurier Nancy, MD;  Location: [facility] INVASIVE CV LAB;  Service: Cardiovascular;  Laterality: N/A;   NASAL SEPTUM SURGERY  1991   TEE WITHOUT CARDIOVERSION  02/15/2012   Procedure: TRANSESOPHAGEAL ECHOCARDIOGRAM (TEE);  Surgeon: [name], MD;  Location: [facility]COPY;  Service: Cardiovascular;  Laterality: N/A;  normal LV, normal EF, mild MR, trace TR, trace PI   TRANSTHORACIC ECHOCARDIOGRAM  12-22-2011    dr Jacinto Halim   normal echo   VAGINAL HYSTERECTOMY  1996   w/  BSO    Allergies: Allergies  Allergen Reactions   Hydrocodone Nausea And Vomiting    Nausea and vomiting    Outpatient Meds: Current Outpatient Medications  Medication Sig Dispense Refill   fluticasone (FLONASE) 50 MCG/ACT nasal spray Place 2 sprays into both nostrils daily.     furosemide (LASIX) 20 MG tablet TAKE ONE TABLET BY MOUTH EVERY MORNING 30 tablet 1   losartan (COZAAR) 50 MG tablet  TAKE ONE TABLET BY MOUTH EVERY MORNING 30 tablet 1   NON FORMULARY Pt uses a cpap nightly     pantoprazole (PROTONIX) 40 MG tablet Take 40 mg by mouth daily.     rosuvastatin (CRESTOR) 20 MG tablet Take 20 mg by mouth at bedtime.     sertraline (ZOLOFT) 25 MG tablet Take 25 mg by mouth daily.     sotalol (BETAPACE) 120 MG tablet TAKE ONE TABLET BY MOUTH TWICE DAILY (Patient taking differently: Take 120 mg by mouth daily.) 60 tablet 1   spironolactone (ALDACTONE) 25 MG tablet Take 25 mg by mouth daily.     SYMBICORT 160-4.5 MCG/ACT inhaler Inhale 2 puffs into the lungs 2 (two) times daily.     albuterol (PROVENTIL) (2.5 MG/3ML) 0.083% nebulizer solution  Take 3 mLs (2.5 mg total) by nebulization every 4 (four) hours as needed for wheezing or shortness of breath. 75 mL 2   albuterol (VENTOLIN HFA) 108 (90 Base) MCG/ACT inhaler Inhale 2 puffs into the lungs every 6 (six) hours as needed for wheezing or shortness of breath. 8 g 2   ALPRAZolam (XANAX) 0.5 MG tablet Take 0.5 mg by mouth daily as needed for anxiety.     ELIQUIS 5 MG TABS tablet TAKE ONE TABLET BY MOUTH TWICE DAILY 60 tablet 1   nitroGLYCERIN (NITROSTAT) 0.4 MG SL tablet DISSOLVE 1 TABLET UNDER THE TONGUE EVERY 5 MINUTES AS NEEDED FOR CHEST PAIN. DO NOT EXCEED A TOTAL OF 3 DOSES IN 15 MINUTES. CALL 911 IF NO RELIEF 25 tablet 1   Current Facility-Administered Medications  Medication Dose Route Frequency Provider Last Rate Last Admin   0.9 %  sodium chloride infusion  500 mL Intravenous Once Sherrilyn Rist, MD       Facility-Administered Medications Ordered in Other Visits  Medication Dose Route Frequency Provider Last Rate Last Admin   sodium chloride flush (NS) 0.9 % injection 3 mL  3 mL Intravenous Q12H Laurier Nancy, MD          ___________________________________________________________________ Objective   Exam:  BP (!) 119/58   Pulse 67   Temp (!) 97.5 F (36.4 C) (Skin)   Ht  (1.549 m)   Wt 179 lb (81.2 kg)   SpO2 95%   BMI 33.82 kg/m   CV: regular , S1/S2 Resp: clear to auscultation bilaterally, normal RR and effort noted GI: soft, no tenderness, with active bowel sounds.   Assessment: Encounter Diagnoses  Name Primary?   Personal history of colonic polyps Yes   Esophageal dysphagia      Plan: Colonoscopy EGD with possible dilation   The patient is appropriate for an endoscopic procedure in the ambulatory setting.   - Amada Jupiter, MD

## 2022-05-04 NOTE — Progress Notes (Signed)
VS by DT  Pt's states no medical or surgical changes since previsit or office visit.  

## 2022-05-04 NOTE — Progress Notes (Signed)
Called to room to assist during endoscopic procedure.  Patient ID and intended procedure confirmed with present staff. Received instructions for my participation in the procedure from the performing physician.  

## 2022-05-04 NOTE — Progress Notes (Signed)
Sedate, gd SR, tolerated procedure well, VSS, report to RN 

## 2022-05-04 NOTE — Patient Instructions (Signed)
Handout provided on polyps, diverticulosis and hemorrhoids.   Resume previous diet.  Continue present medications. Resume Eliquis (apixaban) at prior dose tomorrow.  Await pathology results.  Repeat colonoscopy is recommended for surveillance. The colonoscopy date will be determined after pathology results from today's exam become available for review.   YOU HAD AN ENDOSCOPIC PROCEDURE TODAY AT THE [facility]:   Refer to the procedure report that was given to you for any specific questions about what was found during the examination.  If the procedure report does not answer your questions, please call your gastroenterologist to clarify.  If you requested that your care partner not be given the details of your procedure findings, then the procedure report has been included in a sealed envelope for you to review at your convenience later.  YOU SHOULD EXPECT: Some feelings of bloating in the abdomen. Passage of more gas than usual.  Walking can help get rid of the air that was put into your GI tract during the procedure and reduce the bloating. If you had a lower endoscopy (such as a colonoscopy or flexible sigmoidoscopy) you may notice spotting of blood in your stool or on the toilet paper. If you underwent a bowel prep for your procedure, you may not have a normal bowel movement for a few days.  Please Note:  You might notice some irritation and congestion in your nose or some drainage.  This is from the oxygen used during your procedure.  There is no need for concern and it should clear up in a day or so.  SYMPTOMS TO REPORT IMMEDIATELY:  Following lower endoscopy (colonoscopy or flexible sigmoidoscopy):  Excessive amounts of blood in the stool  Significant tenderness or worsening of abdominal pains  Swelling of the abdomen that is new, acute  Fever of 100F or higher  Following upper endoscopy (EGD)  Vomiting of blood or coffee ground material  New chest pain or pain under the  shoulder blades  Painful or persistently difficult swallowing  New shortness of breath  Fever of 100F or higher  Black, tarry-looking stools  For urgent or emergent issues, a gastroenterologist can be reached at any hour by calling [phone]. Do not use MyChart messaging for urgent concerns.    DIET:  We do recommend a small meal at first, but then you may proceed to your regular diet.  Drink plenty of fluids but you should avoid alcoholic beverages for 24 hours.  ACTIVITY:  You should plan to take it easy for the rest of today and you should NOT DRIVE or use heavy machinery until tomorrow (because of the sedation medicines used during the test).    FOLLOW UP: Our staff will call the number listed on your records the next business day following your procedure.  We will call around 7:15- 8:00 am to check on you and address any questions or concerns that you may have regarding the information given to you following your procedure. If we do not reach you, we will leave a message.     If any biopsies were taken you will be contacted by phone or by letter within the next 1-3 weeks.  Please call us at [phone] if you have not heard about the biopsies in 3 weeks.    SIGNATURES/CONFIDENTIALITY: You and/or your care partner have signed paperwork which will be entered into your electronic medical record.  These signatures attest to the fact that that the information above on your After Visit Summary has  been reviewed and is understood.  Full responsibility of the confidentiality of this discharge information lies with you and/or your care-partner.

## 2022-05-05 ENCOUNTER — Telehealth: Payer: Self-pay | Admitting: *Deleted

## 2022-05-05 NOTE — Telephone Encounter (Signed)
  Follow up Call-     05/04/2022    7:18 AM  Call back number  Post procedure Call Back phone  # [phone]  Permission to leave phone message Yes     Patient questions:  Do you have a fever, pain , or abdominal swelling? No. Pain Score  0 *  Have you tolerated food without any problems? Yes.    Have you been able to return to your normal activities? Yes.    Do you have any questions about your discharge instructions: Diet   No. Medications  No. Follow up visit  No.  Do you have questions or concerns about your Care? No.  Actions: * If pain score is 4 or above: No action needed, pain <4.

## 2022-05-10 ENCOUNTER — Encounter: Payer: Self-pay | Admitting: Gastroenterology

## 2022-05-13 ENCOUNTER — Other Ambulatory Visit: Payer: Self-pay | Admitting: Cardiovascular Disease

## 2022-05-13 DIAGNOSIS — I10 Essential (primary) hypertension: Secondary | ICD-10-CM

## 2022-05-13 DIAGNOSIS — I471 Supraventricular tachycardia, unspecified: Secondary | ICD-10-CM

## 2022-05-13 DIAGNOSIS — G459 Transient cerebral ischemic attack, unspecified: Secondary | ICD-10-CM

## 2022-05-13 DIAGNOSIS — R601 Generalized edema: Secondary | ICD-10-CM

## 2022-06-08 DIAGNOSIS — L308 Other specified dermatitis: Secondary | ICD-10-CM | POA: Diagnosis not present

## 2022-06-10 ENCOUNTER — Ambulatory Visit: Payer: Medicare HMO | Admitting: Cardiovascular Disease

## 2022-07-04 DIAGNOSIS — U071 COVID-19: Secondary | ICD-10-CM | POA: Diagnosis not present

## 2022-07-04 DIAGNOSIS — R0602 Shortness of breath: Secondary | ICD-10-CM | POA: Diagnosis not present

## 2022-07-04 DIAGNOSIS — J40 Bronchitis, not specified as acute or chronic: Secondary | ICD-10-CM | POA: Diagnosis not present

## 2022-07-04 DIAGNOSIS — R059 Cough, unspecified: Secondary | ICD-10-CM | POA: Diagnosis not present

## 2022-07-14 ENCOUNTER — Other Ambulatory Visit: Payer: Self-pay | Admitting: Cardiovascular Disease

## 2022-08-16 ENCOUNTER — Other Ambulatory Visit: Payer: Self-pay | Admitting: Cardiovascular Disease

## 2022-08-16 DIAGNOSIS — G459 Transient cerebral ischemic attack, unspecified: Secondary | ICD-10-CM

## 2022-08-16 DIAGNOSIS — R601 Generalized edema: Secondary | ICD-10-CM

## 2022-08-16 DIAGNOSIS — I10 Essential (primary) hypertension: Secondary | ICD-10-CM

## 2022-08-17 ENCOUNTER — Telehealth: Payer: Self-pay | Admitting: Internal Medicine

## 2022-08-17 NOTE — Telephone Encounter (Signed)
Entered in error

## 2022-09-21 DIAGNOSIS — E669 Obesity, unspecified: Secondary | ICD-10-CM | POA: Diagnosis not present

## 2022-09-21 DIAGNOSIS — I2510 Atherosclerotic heart disease of native coronary artery without angina pectoris: Secondary | ICD-10-CM | POA: Diagnosis not present

## 2022-09-21 DIAGNOSIS — R7303 Prediabetes: Secondary | ICD-10-CM | POA: Diagnosis not present

## 2022-09-21 DIAGNOSIS — N3281 Overactive bladder: Secondary | ICD-10-CM | POA: Diagnosis not present

## 2022-09-21 DIAGNOSIS — I2584 Coronary atherosclerosis due to calcified coronary lesion: Secondary | ICD-10-CM | POA: Diagnosis not present

## 2022-09-21 DIAGNOSIS — K760 Fatty (change of) liver, not elsewhere classified: Secondary | ICD-10-CM | POA: Diagnosis not present

## 2022-09-21 DIAGNOSIS — E785 Hyperlipidemia, unspecified: Secondary | ICD-10-CM | POA: Diagnosis not present

## 2022-09-21 DIAGNOSIS — I10 Essential (primary) hypertension: Secondary | ICD-10-CM | POA: Diagnosis not present

## 2022-11-24 ENCOUNTER — Other Ambulatory Visit: Payer: Self-pay | Admitting: Cardiovascular Disease

## 2022-11-24 DIAGNOSIS — I10 Essential (primary) hypertension: Secondary | ICD-10-CM

## 2022-11-24 DIAGNOSIS — R601 Generalized edema: Secondary | ICD-10-CM

## 2022-11-24 DIAGNOSIS — G459 Transient cerebral ischemic attack, unspecified: Secondary | ICD-10-CM

## 2022-11-24 DIAGNOSIS — I471 Supraventricular tachycardia, unspecified: Secondary | ICD-10-CM

## 2022-11-25 MED ORDER — ROSUVASTATIN CALCIUM 20 MG PO TABS: 20.0000 mg | ORAL_TABLET | Freq: Every morning | ORAL | 1 refills | Status: DC

## 2022-11-25 MED ORDER — APIXABAN 5 MG PO TABS: 5.0000 mg | ORAL_TABLET | Freq: Two times a day (BID) | ORAL | 2 refills | Status: DC

## 2022-11-25 MED ORDER — SOTALOL HCL 120 MG PO TABS: 120.0000 mg | ORAL_TABLET | Freq: Two times a day (BID) | ORAL | 0 refills | Status: DC

## 2022-11-25 MED ORDER — LOSARTAN POTASSIUM 50 MG PO TABS: 50.0000 mg | ORAL_TABLET | Freq: Every morning | ORAL | 2 refills | Status: DC

## 2022-11-25 MED ORDER — SPIRONOLACTONE 25 MG PO TABS: 25.0000 mg | ORAL_TABLET | Freq: Every morning | ORAL | 1 refills | Status: DC

## 2022-11-25 MED ORDER — FUROSEMIDE 20 MG PO TABS: 20.0000 mg | ORAL_TABLET | Freq: Every morning | ORAL | 2 refills | Status: DC

## 2023-02-23 ENCOUNTER — Other Ambulatory Visit: Payer: Self-pay | Admitting: Cardiovascular Disease

## 2023-02-23 DIAGNOSIS — I471 Supraventricular tachycardia, unspecified: Secondary | ICD-10-CM

## 2023-04-13 ENCOUNTER — Other Ambulatory Visit: Payer: Self-pay | Admitting: Internal Medicine

## 2023-04-13 DIAGNOSIS — Z1231 Encounter for screening mammogram for malignant neoplasm of breast: Secondary | ICD-10-CM

## 2023-04-26 DIAGNOSIS — E7849 Other hyperlipidemia: Secondary | ICD-10-CM | POA: Diagnosis not present

## 2023-04-26 DIAGNOSIS — E785 Hyperlipidemia, unspecified: Secondary | ICD-10-CM | POA: Diagnosis not present

## 2023-04-26 DIAGNOSIS — R739 Hyperglycemia, unspecified: Secondary | ICD-10-CM | POA: Diagnosis not present

## 2023-04-26 DIAGNOSIS — I2584 Coronary atherosclerosis due to calcified coronary lesion: Secondary | ICD-10-CM | POA: Diagnosis not present

## 2023-04-26 DIAGNOSIS — M8580 Other specified disorders of bone density and structure, unspecified site: Secondary | ICD-10-CM | POA: Diagnosis not present

## 2023-04-26 DIAGNOSIS — E559 Vitamin D deficiency, unspecified: Secondary | ICD-10-CM | POA: Diagnosis not present

## 2023-04-26 DIAGNOSIS — I10 Essential (primary) hypertension: Secondary | ICD-10-CM | POA: Diagnosis not present

## 2023-04-27 ENCOUNTER — Ambulatory Visit

## 2023-04-29 DIAGNOSIS — I480 Paroxysmal atrial fibrillation: Secondary | ICD-10-CM | POA: Diagnosis not present

## 2023-04-29 DIAGNOSIS — F419 Anxiety disorder, unspecified: Secondary | ICD-10-CM | POA: Diagnosis not present

## 2023-04-29 DIAGNOSIS — R269 Unspecified abnormalities of gait and mobility: Secondary | ICD-10-CM | POA: Diagnosis not present

## 2023-04-29 DIAGNOSIS — I10 Essential (primary) hypertension: Secondary | ICD-10-CM | POA: Diagnosis not present

## 2023-04-29 DIAGNOSIS — E669 Obesity, unspecified: Secondary | ICD-10-CM | POA: Diagnosis not present

## 2023-04-29 DIAGNOSIS — Z7722 Contact with and (suspected) exposure to environmental tobacco smoke (acute) (chronic): Secondary | ICD-10-CM | POA: Diagnosis not present

## 2023-04-29 DIAGNOSIS — Z1339 Encounter for screening examination for other mental health and behavioral disorders: Secondary | ICD-10-CM | POA: Diagnosis not present

## 2023-04-29 DIAGNOSIS — R0602 Shortness of breath: Secondary | ICD-10-CM | POA: Diagnosis not present

## 2023-04-29 DIAGNOSIS — Z1331 Encounter for screening for depression: Secondary | ICD-10-CM | POA: Diagnosis not present

## 2023-04-29 DIAGNOSIS — Z0000 Encounter for general adult medical examination without abnormal findings: Secondary | ICD-10-CM | POA: Diagnosis not present

## 2023-04-29 DIAGNOSIS — R82998 Other abnormal findings in urine: Secondary | ICD-10-CM | POA: Diagnosis not present

## 2023-04-29 DIAGNOSIS — G629 Polyneuropathy, unspecified: Secondary | ICD-10-CM | POA: Diagnosis not present

## 2023-04-29 DIAGNOSIS — I471 Supraventricular tachycardia, unspecified: Secondary | ICD-10-CM | POA: Diagnosis not present

## 2023-05-03 ENCOUNTER — Other Ambulatory Visit: Payer: Self-pay | Admitting: Internal Medicine

## 2023-05-03 DIAGNOSIS — R519 Headache, unspecified: Secondary | ICD-10-CM

## 2023-05-03 DIAGNOSIS — G629 Polyneuropathy, unspecified: Secondary | ICD-10-CM

## 2023-05-03 DIAGNOSIS — R269 Unspecified abnormalities of gait and mobility: Secondary | ICD-10-CM

## 2023-05-13 ENCOUNTER — Ambulatory Visit
Admission: RE | Admit: 2023-05-13 | Discharge: 2023-05-13 | Disposition: A | Discharge status: Home / Self Care | Source: Ambulatory Visit | Attending: Internal Medicine | Admitting: Internal Medicine

## 2023-05-13 DIAGNOSIS — Z1231 Encounter for screening mammogram for malignant neoplasm of breast: Secondary | ICD-10-CM

## 2023-05-18 ENCOUNTER — Ambulatory Visit
Admission: RE | Admit: 2023-05-18 | Discharge: 2023-05-18 | Disposition: A | Discharge status: Home / Self Care | Source: Ambulatory Visit | Attending: Internal Medicine | Admitting: Internal Medicine

## 2023-05-18 DIAGNOSIS — G629 Polyneuropathy, unspecified: Secondary | ICD-10-CM

## 2023-05-18 DIAGNOSIS — R2689 Other abnormalities of gait and mobility: Secondary | ICD-10-CM | POA: Diagnosis not present

## 2023-05-18 DIAGNOSIS — R519 Headache, unspecified: Secondary | ICD-10-CM | POA: Diagnosis not present

## 2023-05-18 DIAGNOSIS — R269 Unspecified abnormalities of gait and mobility: Secondary | ICD-10-CM

## 2023-05-18 DIAGNOSIS — R410 Disorientation, unspecified: Secondary | ICD-10-CM | POA: Diagnosis not present

## 2023-05-18 MED ORDER — GADOPICLENOL 0.5 MMOL/ML IV SOLN
7.5000 mL | Freq: Once | INTRAVENOUS | Status: AC | PRN
  Administered 2023-05-18: 7.5 mL via INTRAVENOUS

## 2023-05-25 ENCOUNTER — Telehealth: Payer: Self-pay | Admitting: Cardiology

## 2023-05-25 ENCOUNTER — Encounter: Payer: Self-pay | Admitting: Cardiology

## 2023-05-25 ENCOUNTER — Ambulatory Visit: Attending: Cardiology | Admitting: Cardiology

## 2023-05-25 VITALS — BP 94/68 | HR 74 | Resp 16 | Ht 61.0 in | Wt 162.4 lb

## 2023-05-25 DIAGNOSIS — R601 Generalized edema: Secondary | ICD-10-CM

## 2023-05-25 DIAGNOSIS — I10 Essential (primary) hypertension: Secondary | ICD-10-CM | POA: Diagnosis not present

## 2023-05-25 DIAGNOSIS — G4733 Obstructive sleep apnea (adult) (pediatric): Secondary | ICD-10-CM | POA: Diagnosis not present

## 2023-05-25 DIAGNOSIS — Z79899 Other long term (current) drug therapy: Secondary | ICD-10-CM

## 2023-05-25 DIAGNOSIS — R002 Palpitations: Secondary | ICD-10-CM | POA: Diagnosis not present

## 2023-05-25 MED ORDER — VERAPAMIL HCL ER 120 MG PO TBCR
120.0000 mg | EXTENDED_RELEASE_TABLET | Freq: Every day | ORAL | 2 refills | Status: DC
Start: 2023-05-25 — End: 2023-07-27

## 2023-05-25 NOTE — Progress Notes (Signed)
 Cardiology Office Note:  .   Date:  05/25/2023  ID:  [name], DOB 07-01-1950, MRN [id number] PCP: Barnetta Liberty, MD  [facility] Providers Cardiologist:  Knox Perl, MD   History of Present Illness: .   Pamela Daniel is a 73 y.o. Caucasian female patient with history of PSVT ablation in 2011, PAF presently on sotalol , morbid obesity and OSA on CPAP, primary hypertension, hypercholesterolemia, COPD secondary to secondhand tobacco use exposure, chronic dyspnea which has been worsening recently and also dizziness presents to reestablish care, I had seen her remotely.  Has had cardiac catheterization on 01/17/2020 revealing normal coronary arteries, coronary CT angiogram on 12/21/2021 also was normal performed by Debborah Fairly, MD  Discussed the use of AI scribe software for clinical note transcription with the patient, who gave verbal consent to proceed.  History of Present Illness Pamela Daniel is a 73 year old female with a history of supraventricular tachycardia who presents with concerns about her heart medications and symptoms of fatigue. She is accompanied by her daughter.  She underwent an ablation in 2011 for supraventricular tachycardia and has since experienced occasional 'fluttering' sensations. She was previously informed of atrial fibrillation but is uncertain about its documentation. She takes sotalol  daily and has been on Eliquis  for three years. Nitroglycerin  is used as needed for chest discomfort, despite no known coronary blockages. She adjusted her blood pressure medications due to low readings and is concerned about low blood pressure.  She is accompanied by her daughter.  She experiences significant fatigue, particularly with activities like talking. Temporary relief is achieved with a CPAP machine, but it is currently broken, and insurance issues have delayed its replacement.  She has been using Wegovy, which has aided in weight loss and improved her  heart symptoms, including reducing palpitations. She feels stronger and more active since losing weight.  She has a history of transient ischemic attack-like symptoms and underwent a transesophageal echocardiogram that showed no patent foramen ovale or other significant findings. She has not been hospitalized since adjusting her medications.  Labs    External Labs:  PCP faxed labs 04/26/2023:  Serum glucose 99 mg, BUN 12, creatinine 0.8, EGFR 70 mL, potassium 4.7, LFTs normal.  Hb 15.6/HCT 46.6, platelets 260.  Total cholesterol 169, triglycerides 135, HDL 35, LDL 107.  Non-HDL cholesterol 134.  TSH normal at 4.01.  Vitamin D  26.1.  ROS  Review of Systems  Cardiovascular:  Positive for palpitations. Negative for chest pain, dyspnea on exertion and leg swelling.  Neurological:  Positive for dizziness.    Physical Exam:   VS:  BP 94/68 (BP Location: Left Arm, Patient Position: Sitting, Cuff Size: Normal)   Pulse 74   Resp 16   Ht 5\' 1"  (1.549 m)   Wt 162 lb 6.4 oz (73.7 kg)   SpO2 97%   BMI 30.69 kg/m    Wt Readings from Last 3 Encounters:  05/25/23 162 lb 6.4 oz (73.7 kg)  05/04/22 179 lb (81.2 kg)  03/11/22 175 lb 12.8 oz (79.7 kg)    Physical Exam Neck:     Vascular: No carotid bruit or JVD.  Cardiovascular:     Rate and Rhythm: Normal rate and regular rhythm.     Pulses: Intact distal pulses.     Heart sounds: Normal heart sounds. No murmur heard.    No gallop.  Pulmonary:     Effort: Pulmonary effort is normal.     Breath sounds: Normal breath sounds.  Abdominal:     General: Bowel sounds are normal.     Palpations: Abdomen is soft.  Musculoskeletal:     Right lower leg: No edema.     Left lower leg: No edema.    Studies Reviewed: .    Cardiac catheterization 01/17/2020 (external source): Normal coronary arteries.  Coronary CTA 12/21/2021 (external source) report only: Coronary calcium  score of 94.5 this places her in the 13 percentile for age and sex  matched individuals in the [facility] database. Right dominant system Mild coronary disease.  Details not available  EKG:    EKG Interpretation Date/Time:  Wednesday May 25 2023 10:39:16 EDT Ventricular Rate:  76 PR Interval:  242 QRS Duration:  68 QT Interval:  394 QTC Calculation: 443 R Axis:   35  Text Interpretation: EKG 05/25/2023: Sinus rhythm with first-degree AV block at rate of 76 bpm, normal axis, PVCs (2).  Nonspecific T abnormality.  Compared to 08/11/2020, first-degree AV block new, PVCs new, PACs no longer present. Confirmed by Kahne Helfand, Jagadeesh ([id number]) on 05/25/2023 10:56:51 AM    Medications and allergies    Allergies  Allergen Reactions   Hydrocodone  Nausea And Vomiting    Nausea and vomiting     Current Outpatient Medications:    albuterol  (PROVENTIL ) (2.5 MG/3ML) 0.083% nebulizer solution, Take 3 mLs (2.5 mg total) by nebulization every 4 (four) hours as needed for wheezing or shortness of breath., Disp: 75 mL, Rfl: 2   albuterol  (VENTOLIN  HFA) 108 (90 Base) MCG/ACT inhaler, Inhale 2 puffs into the lungs every 6 (six) hours as needed for wheezing or shortness of breath., Disp: 8 g, Rfl: 2   ALPRAZolam  (XANAX ) 0.5 MG tablet, Take 0.5 mg by mouth daily as needed for anxiety., Disp: , Rfl:    apixaban  (ELIQUIS ) 5 MG TABS tablet, Take 1 tablet (5 mg total) by mouth 2 (two) times daily., Disp: 180 tablet, Rfl: 2   fluticasone  (FLONASE ) 50 MCG/ACT nasal spray, Place 2 sprays into both nostrils daily., Disp: , Rfl:    losartan  (COZAAR ) 50 MG tablet, Take 1 tablet (50 mg total) by mouth every morning., Disp: 90 tablet, Rfl: 2   nitroGLYCERIN  (NITROSTAT ) 0.4 MG SL tablet, DISSOLVE 1 TABLET UNDER THE TONGUE EVERY 5 MINUTES AS NEEDED FOR CHEST PAIN. DO NOT EXCEED A TOTAL OF 3 DOSES IN 15 MINUTES. CALL 911 IF NO RELIEF, Disp: 25 tablet, Rfl: 1   NON FORMULARY, Pt uses a cpap nightly, Disp: , Rfl:    pantoprazole  (PROTONIX ) 40 MG tablet, Take 40 mg by mouth daily., Disp: , Rfl:     rosuvastatin  (CRESTOR ) 20 MG tablet, Take 1 tablet (20 mg total) by mouth every morning., Disp: 90 tablet, Rfl: 1   sertraline (ZOLOFT) 25 MG tablet, Take 25 mg by mouth daily., Disp: , Rfl:    spironolactone  (ALDACTONE ) 25 MG tablet, Take 1 tablet (25 mg total) by mouth every morning., Disp: 90 tablet, Rfl: 1   SYMBICORT 160-4.5 MCG/ACT inhaler, Inhale 2 puffs into the lungs 2 (two) times daily., Disp: , Rfl:    verapamil (CALAN-SR) 120 MG CR tablet, Take 1 tablet (120 mg total) by mouth at bedtime., Disp: 30 tablet, Rfl: 2   WEGOVY 1.7 MG/0.75ML SOAJ, Inject 1.7 mg into the skin., Disp: , Rfl:    furosemide  (LASIX ) 20 MG tablet, Take 1 tablet (20 mg total) by mouth daily as needed for edema or fluid., Disp: , Rfl:    Meds ordered this encounter  Medications   verapamil (  CALAN-SR) 120 MG CR tablet    Sig: Take 1 tablet (120 mg total) by mouth at bedtime.    Dispense:  30 tablet    Refill:  2    Discontinue Sotalol      Medications Discontinued During This Encounter  Medication Reason   furosemide  (LASIX ) 20 MG tablet    sotalol  (BETAPACE ) 120 MG tablet Change in therapy   sodium chloride  flush (NS) 0.9 % injection 3 mL      ASSESSMENT AND PLAN: .      ICD-10-CM   1. Palpitations  R00.2 verapamil (CALAN-SR) 120 MG CR tablet    2. Primary hypertension  I10 EKG 12-Lead    3. OSA on CPAP  G47.33 Home sleep test    4. High risk medication use  Z79.899     5. Generalized edema  R60.1 furosemide  (LASIX ) 20 MG tablet     Assessment & Plan Hypertension   Hypertension management is complicated by hypotension. Antihypertensive medications were adjusted by discontinuing sotalol  due to its potential contribution to hypotension and initiating verapamil SR 120 mg once daily both for palpitations and for hypertension follow up in two months to reassess blood pressure management.  Palpitations   Palpitations are chronic but non-life-threatening. There is a history of SVT and ablation in  2011, EKGs revealing occasional brief atrial tachycardia but no documented atrial fibrillation (AFib).  The necessity of sotalol  and Eliquis  is questioned without documented AFib. Verification of AFib diagnosis is needed before altering anticoagulation therapy. Contact Dr. Castle Hayne Cid office to obtain documentation of AFib diagnosis. Consider stopping Eliquis  if no AFib is documented.  These are high risk medications.  I have sent a letter to Dr. Francoise Ishihara office.  Supraventricular Tachycardia (SVT)   Post-ablation for SVT in 2011, she has not experienced significant tachycardia. Current palpitations are not attributed to SVT.  Transient Ischemic Attack (TIA)   There is a history of TIA-like symptoms, but recent evaluations, including a brain scan, show no evidence of stroke or significant changes.  Obstructive Sleep Apnea   She uses a CPAP machine, which is currently broken and not covered by insurance. She reports fatigue and benefits from CPAP use for short periods. Address CPAP machine issues with insurance or seek alternative solutions.  Once the gap is closed, I will see her back on a as needed basis.   Signed,  Knox Perl, MD, [facility] 05/25/2023, 5:19 PM [facility] [address] [place], [place] [address] Phone: [phone]. Fax:  [phone]

## 2023-05-25 NOTE — Patient Instructions (Signed)
 Medication Instructions:  Your physician has recommended you make the following change in your medication:  1) START verapamil (Calan SR) 120 mg daily  *If you need a refill on your cardiac medications before your next appointment, please call your pharmacy*  Testing/Procedures: Your physician has recommended that you have a home sleep study. This test records several body functions during sleep, including: brain activity, eye movement, oxygen and carbon dioxide blood levels, heart rate and rhythm, breathing rate and rhythm, the flow of air through your mouth and nose, snoring, body muscle movements, and chest and belly movement.   Follow-Up: At [facility], you and your health needs are our priority.  As part of our continuing mission to provide you with exceptional heart care, our providers are all part of one team.  This team includes your primary Cardiologist (physician) and Advanced Practice Providers or APPs (Physician Assistants and Nurse Practitioners) who all work together to provide you with the care you need, when you need it.  Your next appointment:   2 month(s)  The format for your next appointment:   In Person  Provider:   Knox Perl, MD{  We recommend signing up for the patient portal called "MyChart".  Sign up information is provided on this After Visit Summary.  MyChart is used to connect with patients for Virtual Visits (Telemedicine).  Patients are able to view lab/test results, encounter notes, upcoming appointments, etc.  Non-urgent messages can be sent to your provider as well.   To learn more about what you can do with MyChart, go to [email or web address].

## 2023-05-31 DIAGNOSIS — R0602 Shortness of breath: Secondary | ICD-10-CM | POA: Diagnosis not present

## 2023-05-31 DIAGNOSIS — R7303 Prediabetes: Secondary | ICD-10-CM | POA: Diagnosis not present

## 2023-05-31 DIAGNOSIS — R739 Hyperglycemia, unspecified: Secondary | ICD-10-CM | POA: Diagnosis not present

## 2023-05-31 DIAGNOSIS — I480 Paroxysmal atrial fibrillation: Secondary | ICD-10-CM | POA: Diagnosis not present

## 2023-05-31 DIAGNOSIS — R350 Frequency of micturition: Secondary | ICD-10-CM | POA: Diagnosis not present

## 2023-05-31 DIAGNOSIS — R269 Unspecified abnormalities of gait and mobility: Secondary | ICD-10-CM | POA: Diagnosis not present

## 2023-05-31 DIAGNOSIS — F419 Anxiety disorder, unspecified: Secondary | ICD-10-CM | POA: Diagnosis not present

## 2023-05-31 DIAGNOSIS — I471 Supraventricular tachycardia, unspecified: Secondary | ICD-10-CM | POA: Diagnosis not present

## 2023-05-31 DIAGNOSIS — I10 Essential (primary) hypertension: Secondary | ICD-10-CM | POA: Diagnosis not present

## 2023-06-02 ENCOUNTER — Ambulatory Visit: Admitting: Sleep Medicine

## 2023-06-02 ENCOUNTER — Encounter: Payer: Self-pay | Admitting: Sleep Medicine

## 2023-06-02 VITALS — BP 120/78 | HR 84 | Temp 98.3°F | Ht 61.0 in | Wt 160.8 lb

## 2023-06-02 DIAGNOSIS — I10 Essential (primary) hypertension: Secondary | ICD-10-CM | POA: Diagnosis not present

## 2023-06-02 DIAGNOSIS — G4733 Obstructive sleep apnea (adult) (pediatric): Secondary | ICD-10-CM | POA: Diagnosis not present

## 2023-06-02 DIAGNOSIS — R0609 Other forms of dyspnea: Secondary | ICD-10-CM

## 2023-06-02 NOTE — Patient Instructions (Signed)
 Pamela Daniel

## 2023-06-02 NOTE — Progress Notes (Signed)
 Name:Pamela Daniel MRN: [id number] DOB: [birth year]   CHIEF COMPLAINT:  ESTABLISH CARE FOR OSA   HISTORY OF PRESENT ILLNESS:  Ms. Pamela Daniel is Daniel 73 y.o. w/ Daniel h/o OSA, obesity, HTN, GERD, hyperlipidemia, atrial fibrillation and asthma who presents for reassessment of OSA. Reports that she was initially diagnosed with OSA around 5 years ago and subsequently started on CPAP therapy. Reports that she uses CPAP therapy every night. Reports that her CPAP humidifier has malfunctioned however she still uses the device. Reports feeling more refreshed upon awakening with CPAP therapy. She is currently Daniel full face mask, which is uncomfortable.   Denies snoring with CPAP therapy. Reports nocturnal awakenings due to unclear reasons, and occasionally has difficulty falling back to sleep. Reports Daniel 20 lb weight loss with Wegovy over the last few years. Admits to dry mouth, RLS symptoms and morning headaches. Denies dream enactment, cataplexy, hypnagogic or hypnapompic hallucinations. Reports Daniel family history of sleep apnea. Denies drowsy driving. Drinks 1 soda daily throughout the day, denies alcohol , tobacco or illicit drug use.   Bedtime 8-9 pm Sleep onset 2-3 hours Rise time 10 am   EPWORTH SLEEP SCORE 18    06/02/2023    9:00 AM  Results of the Epworth flowsheet  Sitting and reading 3  Watching TV 3  Sitting, inactive in Daniel public place (e.g. Daniel theatre or Daniel meeting) 2  As Daniel passenger in Daniel car for an hour without Daniel break 3  Lying down to rest in the afternoon when circumstances permit 3  Sitting and talking to someone 1  Sitting quietly after Daniel lunch without alcohol  3  In Daniel car, while stopped for Daniel few minutes in traffic 0  Total score 18     PAST MEDICAL HISTORY :   has Daniel past medical history of Anxiety, Arthritis, Asthma, Blood transfusion without reported diagnosis, Fatty liver, Fatty pancreas, First degree heart block, Frequency of urination, GERD (gastroesophageal reflux  disease), Hemorrhoids, Hepatic steatosis, Hiatal hernia, History of acute pancreatitis (03/16/2015), History of adenomatous polyp of colon, History of concussion (2012), History of kidney stones, History of transient ischemic attack (TIA) (12/22/2011), History of vaginal dysplasia (2016), Hypertension, Pancreatic cyst, Pseudocyst of pancreas, PSVT (paroxysmal supraventricular tachycardia) (HCC), Rectal bleeding, S/P AV (atrioventricular) nodal ablation (01-21-2010    dr Pamela Daniel), Simple renal cyst, Urgency of urination, Urinary frequency, and Urinary leakage.  has Daniel past surgical history that includes Cardiac electrophysiology mapping and ablation (01-21-2010   dr Pamela Daniel); Foot neuroma surgery (Left, 1996); Nasal septum surgery (1991); TEE without cardioversion (02/15/2012); Vaginal hysterectomy (1996); LAPAROSCOPY TAKEDOWN AND REPAIR HIATAL HERNIA /  NISSEN FUNDOPLATION (09-01-2005    dr Pamela Daniel  [facility]); transthoracic echocardiogram (12-22-2011    dr Pamela Daniel); Hemorrhoid surgery (N/Daniel, 10/28/2016); Breast excisional biopsy (Right); and LEFT HEART CATH AND CORONARY ANGIOGRAPHY (N/Daniel, 01/17/2020). Prior to Admission medications   Medication Sig Start Date End Date Taking? Authorizing Provider  albuterol  (VENTOLIN  HFA) 108 (90 Base) MCG/ACT inhaler Inhale 2 puffs into the lungs every 6 (six) hours as needed for wheezing or shortness of breath. 01/02/22  Yes Pamela Carmin, MD  ALPRAZolam  (XANAX ) 0.5 MG tablet Take 0.5 mg by mouth daily as needed for anxiety. 06/25/20  Yes [provider]  apixaban  (ELIQUIS ) 5 MG TABS tablet Take 1 tablet (5 mg total) by mouth 2 (two) times daily. 11/25/22  Yes Pamela Cornwall, MD  fluticasone  (FLONASE ) 50 MCG/ACT nasal spray Place 2 sprays into both nostrils daily.  Yes [provider]  furosemide  (LASIX ) 20 MG tablet Take 1 tablet (20 mg total) by mouth daily as needed for edema or fluid. 05/25/23  Yes Pamela Perl, MD  losartan  (COZAAR ) 50 MG tablet Take 1 tablet (50 mg  total) by mouth every morning. 11/25/22  Yes Pamela Fairly A, MD  nitroGLYCERIN  (NITROSTAT ) 0.4 MG SL tablet DISSOLVE 1 TABLET UNDER THE TONGUE EVERY 5 MINUTES AS NEEDED FOR CHEST PAIN. DO NOT EXCEED Daniel TOTAL OF 3 DOSES IN 15 MINUTES. CALL 911 IF NO RELIEF 03/30/22  Yes Pamela Cornwall, MD  NON FORMULARY Pt uses Daniel cpap nightly   Yes [provider]  pantoprazole  (PROTONIX ) 40 MG tablet Take 40 mg by mouth daily. 04/02/20  Yes [provider]  rosuvastatin  (CRESTOR ) 20 MG tablet Take 1 tablet (20 mg total) by mouth every morning. 11/25/22  Yes Pamela Cornwall, MD  sertraline (ZOLOFT) 25 MG tablet Take 25 mg by mouth daily.   Yes [provider]  spironolactone  (ALDACTONE ) 25 MG tablet Take 1 tablet (25 mg total) by mouth every morning. 11/25/22  Yes Pamela Cornwall, MD  SYMBICORT 160-4.5 MCG/ACT inhaler Inhale 2 puffs into the lungs 2 (two) times daily.   Yes [provider]  verapamil  (CALAN -SR) 120 MG CR tablet Take 1 tablet (120 mg total) by mouth at bedtime. 05/25/23  Yes Pamela Perl, MD  WEGOVY 1.7 MG/0.75ML SOAJ Inject 1.7 mg into the skin.   Yes [provider]  albuterol  (PROVENTIL ) (2.5 MG/3ML) 0.083% nebulizer solution Take 3 mLs (2.5 mg total) by nebulization every 4 (four) hours as needed for wheezing or shortness of breath. 01/02/22 05/25/23  Pamela Carmin, MD   Allergies  Allergen Reactions   Hydrocodone  Nausea And Vomiting    Nausea and vomiting    FAMILY HISTORY:  family history includes Bone cancer in her brother and brother; Breast cancer in her cousin and maternal aunt; Breast cancer (age of onset: 53) in her mother; COPD in her father; Colon cancer in her brother and mother; Dementia in her mother; Heart disease in her brother; Heart disease (age of onset: 78) in her mother; Heart disease (age of onset: 47) in her father; Hypertension in her mother; Lung cancer in her brother; Lung cancer (age of onset: 63) in her brother; Mental illness in her  brother. SOCIAL HISTORY:  reports that she has never smoked. She has never used smokeless tobacco. She reports that she does not drink alcohol  and does not use drugs.   Review of Systems:  Gen:  Denies  fever, sweats, chills weight loss  HEENT: Denies blurred vision, double vision, ear pain, eye pain, hearing loss, nose bleeds, sore throat Cardiac:  No dizziness, chest pain or heaviness, chest tightness,edema, No JVD Resp:   No cough, -sputum production, -shortness of breath,-wheezing, -hemoptysis,  Gi: Denies swallowing difficulty, stomach pain, nausea or vomiting, diarrhea, constipation, bowel incontinence Gu:  Denies bladder incontinence, burning urine Ext:   Denies Joint pain, stiffness or swelling Skin: Denies  skin rash, easy bruising or bleeding or hives Endoc:  Denies polyuria, polydipsia , polyphagia or weight change Psych:   Denies depression, insomnia or hallucinations  Other:  All other systems negative  VITAL SIGNS: BP 120/78 (BP Location: Right Arm, Patient Position: Sitting, Cuff Size: Normal)   Pulse 84   Temp 98.3 F (36.8 C) (Oral)   Ht 5\' 1"  (1.549 m)   Wt 160 lb 12.8 oz (72.9 kg)   SpO2 97%  BMI 30.38 kg/m    Physical Examination:   General Appearance: No distress  EYES PERRLA, EOM intact.   NECK Supple, No JVD Pulmonary: normal breath sounds, No wheezing.  CardiovascularNormal S1,S2.  No m/r/g.   Abdomen: Benign, Soft, non-tender. Skin:   warm, no rashes, no ecchymosis  Extremities: normal, no cyanosis, clubbing. Neuro:without focal findings,  speech normal  PSYCHIATRIC: Mood, affect within normal limits.   ASSESSMENT AND PLAN  OSA Due to significant weight loss, will reassess apnea with HST.  Discussed the consequences of untreated sleep apnea. Advised not to drive drowsy for safety of patient and others. Will complete further evaluation with Daniel home sleep study and complete order for new APAP device.     HTN Stable, on current management.  Following with PCP.   Dyspnea on exertion Will complete PFT's and refer to pulm to establish care.    MEDICATION ADJUSTMENTS/LABS AND TESTS ORDERED: Recommend Sleep Study   Patient  satisfied with Plan of action and management. All questions answered  Follow up to review HST results and treatment plan.   I spent Daniel total of 45 minutes reviewing chart data, face-to-face evaluation with the patient, counseling and coordination of care as detailed above.    Saphira Lahmann, M.D.  Sleep Medicine [facility]

## 2023-06-02 NOTE — Addendum Note (Signed)
 Addended by: Aedyn Mckeon on: 06/02/2023 01:09 PM   Modules accepted: Orders

## 2023-06-06 NOTE — Telephone Encounter (Signed)
 No documented AF. Will discuss with patient on her visit

## 2023-06-06 NOTE — Telephone Encounter (Signed)
 Thank you :)

## 2023-06-10 ENCOUNTER — Encounter

## 2023-06-10 DIAGNOSIS — G4733 Obstructive sleep apnea (adult) (pediatric): Secondary | ICD-10-CM

## 2023-06-10 DIAGNOSIS — G4730 Sleep apnea, unspecified: Secondary | ICD-10-CM | POA: Diagnosis not present

## 2023-06-28 ENCOUNTER — Encounter: Payer: Self-pay | Admitting: Family Medicine

## 2023-06-28 ENCOUNTER — Other Ambulatory Visit (HOSPITAL_COMMUNITY)
Admission: RE | Admit: 2023-06-28 | Discharge: 2023-06-28 | Disposition: A | Discharge status: Home / Self Care | Source: Ambulatory Visit | Attending: Family Medicine | Admitting: Family Medicine

## 2023-06-28 ENCOUNTER — Ambulatory Visit: Admitting: Family Medicine

## 2023-06-28 VITALS — BP 129/85 | HR 94 | Wt 163.0 lb

## 2023-06-28 DIAGNOSIS — N890 Mild vaginal dysplasia: Secondary | ICD-10-CM | POA: Insufficient documentation

## 2023-06-28 DIAGNOSIS — N904 Leukoplakia of vulva: Secondary | ICD-10-CM | POA: Diagnosis not present

## 2023-06-28 DIAGNOSIS — N8110 Cystocele, unspecified: Secondary | ICD-10-CM | POA: Diagnosis not present

## 2023-06-28 DIAGNOSIS — N3946 Mixed incontinence: Secondary | ICD-10-CM | POA: Insufficient documentation

## 2023-06-28 DIAGNOSIS — Z1151 Encounter for screening for human papillomavirus (HPV): Secondary | ICD-10-CM | POA: Insufficient documentation

## 2023-06-28 DIAGNOSIS — Z01411 Encounter for gynecological examination (general) (routine) with abnormal findings: Secondary | ICD-10-CM | POA: Insufficient documentation

## 2023-06-28 MED ORDER — CLOBETASOL PROPIONATE E 0.05 % EX CREA: 1.0000 | TOPICAL_CREAM | Freq: Three times a day (TID) | CUTANEOUS | 5 refills | Status: AC

## 2023-06-28 NOTE — Assessment & Plan Note (Signed)
 Patient with current flair. Increase clobetasol to tid. If not improved in 2 weeks, then return for vulvar biopsy.  Use high potency steroid tid x 4 weeks, then bid x 4 weeks, the every day x 4 weeks, then 2x/week lifelong. Discussed lifelong need for treatment and surveillance and risk of vulvar cancer. Then q 6 month surveillance.

## 2023-06-28 NOTE — Assessment & Plan Note (Signed)
 No f/u since 2016--pap done today

## 2023-06-28 NOTE — Progress Notes (Signed)
 CC: Prolapse bladder x couple years- Pessery burned caused pain- didn't work  Painful/ swelling     Hysterectomy- 1995/1996- Pt stating that everything was removed.  Having pain and soreness

## 2023-06-28 NOTE — Patient Instructions (Signed)
 3x/day x 4 weeks, 2x/day x 4 weeks, daily x 4 weeks, then 2x/week

## 2023-06-28 NOTE — Assessment & Plan Note (Signed)
 Patient has tried and failed pessary (? Allergy). She remains sexually active. Has h/o TVH with good apical support. Refer to URO/GYN due to this and mixed incontinence.

## 2023-06-28 NOTE — Assessment & Plan Note (Signed)
 Referral to URO/GYN ?

## 2023-06-28 NOTE — Progress Notes (Signed)
 Subjective:    Patient ID: Pamela Daniel is a 73 y.o. female presenting with New Patient (Initial Visit)  on 06/28/2023  HPI: Pt. Is a G4P0103 with SVD x 4. Had fibroids and had TVH/BSO in 1996. In 2023, she developed a bulge and has some abdominal pain during that time. Has attempted pessary. She developed burning and believes she has an allergy to the material in the pessary. Continues to have pain and pressure.  She has h/o VAIN with biopsy, last in 2016 and no f/u since that time. H/o lichen sclerosis. Has been taking clobetasol at times but with recent irritation, has increased usage. Burning noted int he vagina and to rectum. Also with urinary incontinence worse with coughing and activity. Has some urge incontinence as well. Typically wears a pad. She continues to be sexually active.  Review of Systems  Constitutional:  Negative for chills and fever.  Respiratory:  Negative for shortness of breath.   Cardiovascular:  Negative for chest pain.  Gastrointestinal:  Negative for abdominal pain, nausea and vomiting.  Genitourinary:  Positive for pelvic pain and vaginal pain. Negative for dysuria.  Skin:  Negative for rash.      Objective:    BP 129/85   Pulse 94   Wt 163 lb (73.9 kg)   BMI 30.80 kg/m  Physical Exam Constitutional:      General: She is not in acute distress.    Appearance: She is well-developed.  HENT:     Head: Normocephalic and atraumatic.  Eyes:     General: No scleral icterus. Cardiovascular:     Rate and Rhythm: Normal rate.  Pulmonary:     Effort: Pulmonary effort is normal.  Abdominal:     Palpations: Abdomen is soft.  Genitourinary:    Comments: White plaques noted on perineum on labia majora and superiorly around the clitoral hood. There is ulceration noted at introitus bilaterally. The vaginal cuff is without lesion. Pap obtained. Cystocele noted. No rectocele. Vaginal support feels normal. Musculoskeletal:     Cervical back: Neck  supple.  Skin:    General: Skin is warm and dry.  Neurological:     Mental Status: She is alert and oriented to person, place, and time.         Assessment & Plan:   Problem List Items Addressed This Visit       Unprioritized   VAIN I (vaginal intraepithelial neoplasia grade I)   No f/u since 2016--pap done today      Relevant Orders   Cytology - PAP( [place])   Female cystocele   Patient has tried and failed pessary (? Allergy). She remains sexually active. Has h/o TVH with good apical support. Refer to URO/GYN due to this and mixed incontinence.      Relevant Orders   Ambulatory referral to Urogynecology   Mixed stress and urge urinary incontinence   Referral to URO/GYN      Relevant Orders   Ambulatory referral to Urogynecology   Lichen sclerosus et atrophicus of the vulva - Primary   Patient with current flair. Increase clobetasol to tid. If not improved in 2 weeks, then return for vulvar biopsy.  Use high potency steroid tid x 4 weeks, then bid x 4 weeks, the every day x 4 weeks, then 2x/week lifelong. Discussed lifelong need for treatment and surveillance and risk of vulvar cancer. Then q 6 month surveillance.       Relevant Medications   Clobetasol Prop Emollient  Base (CLOBETASOL PROPIONATE E) 0.05 % emollient cream      Return in about 6 months (around 12/28/2023).  Granville Layer, MD 06/28/2023 9:47 AM

## 2023-07-01 ENCOUNTER — Other Ambulatory Visit: Payer: Self-pay

## 2023-07-01 NOTE — Patient Outreach (Signed)
 Aging Gracefully Program  07/01/2023  CALAYA GILDNER 08-04-1950 [id number]   [facility] Evaluation Interviewer made contact with patient. Aging Gracefully survey completed.   Interviewer will send referral to RN and OT for follow up.   [facility]  Population Health Care Management Assistant  Direct Dial: [phone]  Fax: [phone] Website: [email or web address]

## 2023-07-06 ENCOUNTER — Ambulatory Visit: Payer: Self-pay | Admitting: Family Medicine

## 2023-07-06 NOTE — Telephone Encounter (Signed)
-----   Message from Granville Layer sent at 07/06/2023  8:11 AM EDT ----- Needs colpo ----- Message ----- From: Interface, Lab In [facility] Sent: 07/01/2023   3:46 PM EDT To: Granville Layer, MD

## 2023-07-06 NOTE — Telephone Encounter (Signed)
 Called pt to go over pap results, and recommendation to do a colpo, appt made on 7/31 with Dr Adriana Hopping

## 2023-07-07 ENCOUNTER — Telehealth: Payer: Self-pay | Admitting: Cardiology

## 2023-07-07 ENCOUNTER — Encounter: Payer: Self-pay | Admitting: Cardiovascular Disease

## 2023-07-07 ENCOUNTER — Ambulatory Visit: Attending: Cardiology

## 2023-07-07 DIAGNOSIS — R002 Palpitations: Secondary | ICD-10-CM

## 2023-07-07 MED ORDER — METRONIDAZOLE 500 MG PO TABS: 500.0000 mg | ORAL_TABLET | Freq: Two times a day (BID) | ORAL | 0 refills | Status: AC

## 2023-07-07 NOTE — Progress Notes (Unsigned)
 Enrolled for [employer] to mail a ZIO XT long term holter monitor to the patients address on file.

## 2023-07-07 NOTE — Addendum Note (Signed)
 Addended by: Granville Layer on: 07/07/2023 09:01 AM   Modules accepted: Orders

## 2023-07-08 DIAGNOSIS — G4733 Obstructive sleep apnea (adult) (pediatric): Secondary | ICD-10-CM | POA: Diagnosis not present

## 2023-07-08 DIAGNOSIS — R0683 Snoring: Secondary | ICD-10-CM | POA: Diagnosis not present

## 2023-07-08 NOTE — Telephone Encounter (Signed)
 Patient notified.  She is aware monitor will be mailed to her.

## 2023-07-11 ENCOUNTER — Encounter: Payer: Self-pay | Admitting: Specialist

## 2023-07-11 ENCOUNTER — Ambulatory Visit: Payer: Self-pay

## 2023-07-11 DIAGNOSIS — R0609 Other forms of dyspnea: Secondary | ICD-10-CM

## 2023-07-11 DIAGNOSIS — G4733 Obstructive sleep apnea (adult) (pediatric): Secondary | ICD-10-CM

## 2023-07-11 DIAGNOSIS — I10 Essential (primary) hypertension: Secondary | ICD-10-CM

## 2023-07-18 ENCOUNTER — Other Ambulatory Visit: Payer: Self-pay | Admitting: Specialist

## 2023-07-21 ENCOUNTER — Encounter: Payer: Self-pay | Admitting: Pulmonary Disease

## 2023-07-21 ENCOUNTER — Other Ambulatory Visit: Payer: Self-pay | Admitting: *Deleted

## 2023-07-21 ENCOUNTER — Ambulatory Visit: Admitting: Pulmonary Disease

## 2023-07-21 VITALS — BP 122/62 | HR 78 | Ht 61.0 in | Wt 163.0 lb

## 2023-07-21 DIAGNOSIS — R059 Cough, unspecified: Secondary | ICD-10-CM

## 2023-07-21 DIAGNOSIS — Z7722 Contact with and (suspected) exposure to environmental tobacco smoke (acute) (chronic): Secondary | ICD-10-CM

## 2023-07-21 DIAGNOSIS — K449 Diaphragmatic hernia without obstruction or gangrene: Secondary | ICD-10-CM | POA: Diagnosis not present

## 2023-07-21 DIAGNOSIS — R0609 Other forms of dyspnea: Secondary | ICD-10-CM

## 2023-07-21 MED ORDER — BUDESONIDE-FORMOTEROL FUMARATE 160-4.5 MCG/ACT IN AERO: 2.0000 | INHALATION_SPRAY | Freq: Two times a day (BID) | RESPIRATORY_TRACT | 12 refills | Status: AC

## 2023-07-21 NOTE — Patient Outreach (Signed)
 Aging Gracefully Program  07/21/2023  GERENE NEDD [birth year] [id number]   Telephone call made to Ms. Dusing to schedule home visit with AG RN.  Home visit scheduled for Tuesday, July 15th at 10 am. Writer's contact information provided.    Pablo Hurst, MSN, RN, BSN [place]  [facility], [facility] RN Case Manager for Aging Gracefully Direct Dial: [phone]

## 2023-07-21 NOTE — Progress Notes (Signed)
 @Patient  ID: Pamela Daniel, female    DOB: Jul 20, 1950, 73 y.o.   MRN: [id number]  Chief Complaint  Patient presents with   Follow-up    Referring provider: Jess Devona BIRCH, MD  HPI:   73 y.o. woman whom are seen for evaluation of dyspnea on exertion.  Most recent note from Dr. Jess, sleep doctor reviewed.  Multiple cardiology notes reviewed.  Note from referring provider, PCP reviewed.  Chief complaint shortness of breath.  Less and less activity.  Less active.  Due to severe shortness of breath even on light surfaces short distances.  Around the house at [place].  No attempt everything to bear was.  No position make his bed worse.  No seasonal environmental factors she can notify him if he is better worse.  Albuterol  helps some.  Albeit short lived.  She tried Symbicort in the past and nothing was super helpful.  Although she admits to not taking as prescribed.  Not as frequently.  She has history of asthma.  Longstanding exposure to smoke secondhand as well as cooking, with fire stones.  She does not smoke, never smoker.  PFTs were ordered but not scheduled.  We discussed helping to be more aggressive with her asthma regimen and see if this helps.  Encouraging albuterol  helps some I think we can make a further improvement.  Need PFTs for further evaluation.   Questionaires / Pulmonary Flowsheets:   ACT:      No data to display          MMRC:     No data to display          Epworth:     06/02/2023    9:00 AM  Results of the Epworth flowsheet  Sitting and reading 3  Watching TV 3  Sitting, inactive in a public place (e.g. a theatre or a meeting) 2  As a passenger in a car for an hour without a break 3  Lying down to rest in the afternoon when circumstances permit 3  Sitting and talking to someone 1  Sitting quietly after a lunch without alcohol  3  In a car, while stopped for a few minutes in traffic 0  Total score 18    Tests:   FENO:  No results found for:  NITRICOXIDE  PFT:     No data to display          WALK:      No data to display          Imaging: Personally reviewed and as per EMR discussion this note No results found.  Lab Results: Personally reviewed CBC    Component Value Date/Time   WBC 4.5 01/02/2022 1425   RBC 5.13 (H) 01/02/2022 1425   HGB 15.8 (H) 01/02/2022 1425   HGB 13.7 08/22/2020 1150   HCT 47.8 (H) 01/02/2022 1425   PLT 195 01/02/2022 1425   PLT 152 08/22/2020 1150   MCV 93.2 01/02/2022 1425   MCV 95.4 03/10/2012 1515   MCH 30.8 01/02/2022 1425   MCHC 33.1 01/02/2022 1425   RDW 12.7 01/02/2022 1425   LYMPHSABS 1.7 01/02/2022 1425   MONOABS 0.7 01/02/2022 1425   EOSABS 0.2 01/02/2022 1425   BASOSABS 0.0 01/02/2022 1425    BMET    Component Value Date/Time   NA 138 01/02/2022 1425   K 4.1 01/02/2022 1425   CL 104 01/02/2022 1425   CO2 22 01/02/2022 1425   GLUCOSE 96 01/02/2022 1425  BUN 21 01/02/2022 1425   CREATININE 0.93 01/02/2022 1425   CREATININE 0.70 08/22/2020 1150   CREATININE 0.72 03/15/2014 1233   CALCIUM  9.6 01/02/2022 1425   GFRNONAA >60 01/02/2022 1425   GFRNONAA >60 08/22/2020 1150   GFRNONAA 88 03/15/2014 1233   GFRAA >60 04/11/2019 0815   GFRAA >89 03/15/2014 1233    BNP    Component Value Date/Time   BNP 56.4 07/29/2020 1634    ProBNP    Component Value Date/Time   PROBNP 70.3 12/21/2011 2130    Specialty Problems       Pulmonary Problems   HIATAL HERNIA   Qualifier: Diagnosis of  By: Genie CMA (AAMA), Leisha   Replacing diagnoses that were inactivated after the 04/19/22 regulatory import      Cough    Allergies  Allergen Reactions   Hydrocodone  Nausea And Vomiting    Nausea and vomiting    Immunization History  Administered Date(s) Administered   Influenza-Unspecified 09/12/2014, 12/29/2016   Pneumococcal Conjugate-13 04/04/2014   Td 04/04/2014    Past Medical History:  Diagnosis Date   Anxiety    Arthritis    SPINE    Asthma    Blood transfusion without reported diagnosis    Fatty liver    Fatty pancreas    First degree heart block    Frequency of urination    GERD (gastroesophageal reflux disease)    Hemorrhoids    Hepatic steatosis    Hiatal hernia    POST RESIDUAL SMALL HH PER IMAGING   History of acute pancreatitis 03/16/2015   History of adenomatous polyp of colon    tubular adenoma's 04-08-2014/   hyperplastic bening polyp 2000   History of concussion 2012   no residual   History of kidney stones    History of transient ischemic attack (TIA) 12/22/2011   no residual   History of vaginal dysplasia 2016   Hypertension    Pancreatic cyst    Pseudocyst of pancreas    PSVT (paroxysmal supraventricular tachycardia) (HCC)    cardioloigst-  dr ladona--- last visit 2013 per pt and is currently followed by pcp   Rectal bleeding    S/P AV (atrioventricular) nodal ablation 01-21-2010    dr fernande   Simple renal cyst    bilateral per imaging   Urgency of urination    Urinary frequency    Urinary leakage     Tobacco History: Social History   Tobacco Use  Smoking Status Never  Smokeless Tobacco Never   Counseling given: Not Answered   Continue to not smoke  Outpatient Encounter Medications as of 07/21/2023  Medication Sig   budesonide-formoterol  (SYMBICORT) 160-4.5 MCG/ACT inhaler Inhale 2 puffs into the lungs 2 (two) times daily.   albuterol  (PROVENTIL ) (2.5 MG/3ML) 0.083% nebulizer solution Take 3 mLs (2.5 mg total) by nebulization every 4 (four) hours as needed for wheezing or shortness of breath.   albuterol  (VENTOLIN  HFA) 108 (90 Base) MCG/ACT inhaler Inhale 2 puffs into the lungs every 6 (six) hours as needed for wheezing or shortness of breath.   ALPRAZolam  (XANAX ) 0.5 MG tablet Take 0.5 mg by mouth daily as needed for anxiety.   Clobetasol  Prop Emollient Base (CLOBETASOL  PROPIONATE E) 0.05 % emollient cream Apply 1 Application topically 3 (three) times daily. Tid x 4 weeks, bid x 4  weeks, daily x 4 weeks, then 2x/week   fluticasone  (FLONASE ) 50 MCG/ACT nasal spray Place 2 sprays into both nostrils daily.   furosemide  (LASIX ) 20  MG tablet Take 1 tablet (20 mg total) by mouth daily as needed for edema or fluid.   losartan  (COZAAR ) 50 MG tablet Take 1 tablet (50 mg total) by mouth every morning.   nitroGLYCERIN  (NITROSTAT ) 0.4 MG SL tablet DISSOLVE 1 TABLET UNDER THE TONGUE EVERY 5 MINUTES AS NEEDED FOR CHEST PAIN. DO NOT EXCEED A TOTAL OF 3 DOSES IN 15 MINUTES. CALL 911 IF NO RELIEF   NON FORMULARY Pt uses a cpap nightly   pantoprazole  (PROTONIX ) 40 MG tablet Take 40 mg by mouth daily.   rosuvastatin  (CRESTOR ) 20 MG tablet Take 1 tablet (20 mg total) by mouth every morning.   sertraline (ZOLOFT) 25 MG tablet Take 25 mg by mouth daily.   spironolactone  (ALDACTONE ) 25 MG tablet Take 1 tablet (25 mg total) by mouth every morning.   verapamil  (CALAN -SR) 120 MG CR tablet Take 1 tablet (120 mg total) by mouth at bedtime.   WEGOVY 1.7 MG/0.75ML SOAJ Inject 1.7 mg into the skin.   [DISCONTINUED] SYMBICORT 160-4.5 MCG/ACT inhaler Inhale 2 puffs into the lungs 2 (two) times daily.   No facility-administered encounter medications on file as of 07/21/2023.     Review of Systems  Review of Systems  No chest pain exertion.  No orthopnea or PND.  Comprehensive review of systems otherwise negative. Physical Exam  BP 122/62 (BP Location: Left Arm, Patient Position: Sitting, Cuff Size: Large)   Pulse 78   Ht 5' 1 (1.549 m)   Wt 163 lb (73.9 kg)   SpO2 97%   BMI 30.80 kg/m   Wt Readings from Last 5 Encounters:  07/21/23 163 lb (73.9 kg)  06/28/23 163 lb (73.9 kg)  06/02/23 160 lb 12.8 oz (72.9 kg)  05/25/23 162 lb 6.4 oz (73.7 kg)  05/04/22 179 lb (81.2 kg)    BMI Readings from Last 5 Encounters:  07/21/23 30.80 kg/m  06/28/23 30.80 kg/m  06/02/23 30.38 kg/m  05/25/23 30.69 kg/m  05/04/22 33.82 kg/m     Physical Exam General: Sitting in chair, no acute  distress Eyes: EOMI, no icterus Neck: Supple, no JVP appreciated Pulmonary: Distant, clear, bit tight, mild dyspnea noted with speaking in multiple sentences Cardiovascular: Regular rate and rhythm Abdomen: Nondistended MSK: No synovitis, no joint effusion Neuro: Normal gait, no weakness Psych: Normal mood, full affect  Assessment & Plan:   Dyspnea on exertion: Suspect multifactorial related to history of asthma.  Also likely element of deconditioning.  PFTs with further evaluation.  Asthma: Suspect poorly controlled.  Improvement in symptoms with albuterol .  Add ICS/LABA with Symbicort.  Sounds like was not very helpful in the past but admits to not using twice a day as prescribed.  Stressed importance of strict adherence.  Can escalate to triple inhaled therapy in the future, consider Biologics in the future.   Return in about 3 months (around 10/21/2023) for f/u Dr. Annella, after PFT.   Pamela JONELLE Annella, MD 07/21/2023   This appointment required 41 minutes of patient care (this includes precharting, chart review, review of results, face-to-face care, etc.).

## 2023-07-21 NOTE — Patient Outreach (Signed)
 Aging Gracefully Program  OT Initial Visit  07/18/2023  Pamela Daniel 01-31-1950 [id number]  Visit:  1- Initial Visit  Start Time:  1515 End Time:  1630 Total Minutes:  75  CCAP: Typical Daily Routine: Typical Daily Routine:: ms. Schueler lives alone and receives assistance from her daughter.  she uses her daughters house to shower as she does not have hot water (will be fixed soon).  she goes shopping with her daughter and does activities around her house. What Types Of Care Problems Are You Having Throughout The Day?: limited in activity tolerance due to breathing issues.  question if medically induced or environmental. What Kind Of Help Do You Receive?: daughter assists with iadls Do You Think You Need Other Types Of Help?: safety in bathroom What Do You Think Would Make Everyday Life Easier For You?: easier to get in and out of shower and not having to exert self when getting dressed.  if breathing was better, she could do all activities easier What Is A Good Day Like?: not as fatigued or in pain What Is A Bad Day Like?: increased fatigue Do You Have Time For Yourself?: yes Patient Reported Equipment: Patient Reported Equipment Currently Used: Rollator (as needed) Functional Mobility-Walking Indoors/Getting Around the House: Walking Indoors/Getting Around [employer]: A Little Difficulty Do You:: Use A Device Importance Of Learning New Strategies:: Not At All Functional Mobility-Walk A Block: Walk A Block: Unable To Do Do You:: Use A Device Importance Of Learning New Strategies:: Very Much Functional Mobility-Maintain Balance While Showering: Maintaining Balance While Showering: A Lot Of Difficulty Do You:: Use Personal Assistance Importance Of Learning New Strategies:: Very Much Observation: Maintain Balance While Showering: Minimal Assistance Safety: A Little Risk Efficiency: Somewhat Intervention: Yes Functional Mobility-Stooping, Crouching, Kneeling To Retreive  Item: Stooping, Crouching, or Kneeling To Retrieve Item: A Lot Of Difficulty Do You:: No Device/No Assistance Importance Of Learning New Strategies:: Very Much Intervention: Yes Functional Mobility-Bending From Standing Position To Pick Up Clothing Off The Floor: Bending Over From Standing Position To Pick Up Clothing Off The Floor: A Lot Of Difficulty Do You:: No Device/No Assistance Importance Of Learning New Strategies:: Very Much Intervention: Yes Functional Mobility-Reaching For Items Above Shoulder Level: Reaching For Items Above Shoulder Level: A Little Difficulty Do You:: No Device/No Assistance Importance Of Learning New Strategies:: Not At All Functional Mobility-Climb 1 Flight Of Stairs: Climb 1 Flight Of Stairs: A Little Difficulty Do You:: No Device/No Assistance Importance Of Learning New Strategies:: Very Much Safety: A Little Risk Efficiency: Somewhat Intervention: Yes Functional Mobility-Move In And Out Of Chair: Move In and Out Of A Chair: No Difficulty Functional Mobility-Move In And Out Of Bed: Move In and Out Of Bed: No Difficulty Functional Mobility-Move In And Out Of Bath/Shower: Move In And Out Of A Bath/Shower: Moderate Difficulty Do You:: No Device/No Assistance Importance Of Learning New Strategies:: Very Much Intervention: Yes Functional Mobility-Get On And Off Toilet: Getting Up From The Floor: No Difficulty Functional Mobility-Into And Out Of Car, Not Including Driving: Into  And Out Of Car, Not Including Driving: A Little Difficulty Do You:: No Device/No Assistance Importance Of Learning New Strategies:: A Little Functional Mobility-Other Mobility Difficulty:      Activities of Daily Living-Bathing/Showering: ADL-Bathing/Showering: A Little Difficulty Do You:: No Device/No Assistance Importance Of Learning New Strategies: A Little Activities of Daily Living-Personal Hygiene and Grooming: Personal Hygiene and Grooming: A Little  Difficulty Do You:: No Device/No Assistance Importance Of Learning New Strategies: A Little  Activities of Daily Living-Toilet Hygiene: Toilet Hygiene: A Little Difficulty Do You:: No Device/No Assistance Importance Of Learning New Strategies: A Little Activities of Daily Living-Put On And Take Off Undergarments (Incl. Fasteners): Put On And Take Off Undergarments (Incl. Fasteners): Moderate Difficulty Do You:: No Device/No Assistance Importance Of Learning New Strategies: Very Much Intervention: Yes Activities of Daily Living-Put On And Take Off Shirt/Dress/Coat (Incl. Fasteners): Put On And Take Off Shirt/Dress/Coat (Incl. Fasteners): A Little Difficulty Do You:: No Device/No Assistance Importance Of Learning New Strategies: A Little Activities of Daily Living-Put On And Take Off Socks And Shoes: Put On And Take Off Socks And  Shoes: A Lot of Difficulty Do You:: No Device/No Assistance Importance Of Learning New Strategies: Very Much Intervention: Yes Activities of Daily Living-Feed Self: Feed Self: No Difficulty Activities of Daily Living-Rest And Sleep: Rest and Sleep: A Lot of Difficulty Do You:: Use A Device Importance Of Learning New Strategies: Very Much Other Comments:: thinks if cpap is fixed and starts taking prescribed medicine it will be better Activities of Daily Living-Sexual Activity: Sexual  Activity: N/A Activities of Daily Living-Other Activity Identified:    Instrumental Activities of Daily Living-Light Homemaking (Laundry, Straightening Up, Vacuuming):  Do Light Homemaking (Laundry, Straightening Up, Vacuuming): A Little Difficulty Do You:: No Device/No Assistance Importance Of Learning New Strategies: A Little Instrumental Activities of Daily Living-Making A Bed: Making a Bed: A Little Difficulty Do You:: No Device/No Assistance Importance Of Learning New Strategies: A Little Instrumental Activities of Daily Living-Washing Dishes By Hand While Standing At  The Sink: Washing Dishes By Hand While Standing At The Sink: A Little Difficulty Do You:: No Device/No Assistance Importance Of Learning New Strategies: A Little Instrumental Activities of Daily Living-Grocery Shopping: Do Grocery Shopping: A Little Difficulty Do You:: No Device/No Assistance Importance Of Learning New Strategies: A Little Instrumental Activities of Daily Living-Use Telephone: Use Telephone: No Difficulty Instrumental Activities of Daily Living-Financial Management: Financial Management: No Difficulty Instrumental Activities of Daily Living-Medications: Take Medications: A Little Difficulty Do You:: No Device/No Assistance Importance Of Learning New Strategies: A Little Instrumental Activities of Daily Living-Health Management And Maintenance: Health Management & Maintenance: No Difficulty Do You:: No Device/No Assistance Importance Of Learning New Strategies: Not At All Instrumental Activities of Daily Living-Meal Preparation and Clean-Up: Meal Preparation and Clean-Up: N/A Instrumental Activities of Daily Living-Provide Care For Others/Pets: Care For Others/Pets: N/A Instrumental Activities of Daily Living-Take Part In Organized Social Activities: Take Part In Organized Social Activities: N/A Instrumental Activities of Daily Living-Leisure Participation: Leisure Participation: N/A Instrumental Activities of Daily Living-Employment/Volunteer Activities: Employment/Volunteer Activities: N/A Instrumental Activities of Daily Living-Other Identifies:    Readiness To Change Score:  Readiness to Change Score: 8  Home Environment Assessment: Outside Home Entry:: wnl Entryway/Foyer:: wnl Dining Room:: cluttered but structurally ok Living Room:: uses sunroom - very steep stairs to this area and handrail is too wide to hold onto. Kitchen:: wnl Stairs:: stairs are steep Bathroom:: black mold on ceiling, weak flooring.  would benefit from ada toilet and walk in  shower Master Bedroom:: wnl - flooring is weak Laundry:: wnl Basement:: n/a Hallways:: cluttered Smoke/CO2 Detector:: did not observe Scientist, forensic:: did no observe Mailbox:: did not observe  Horticulturist, commercial:    Patient Education: Education Provided: Yes Education Details: provided patient with check for safety brochure and discussed fall prevention Person(s) Educated: Patient, Child(ren) Comprehension: Verbalized Understanding, Returned Demonstration  Goals:  Goals Addressed  This Visit's Progress    Patient Stated       Patient will improve bathroom safety.     Patient Stated       Patient will improve safety entering and exiting sunroom/livingroom area.     Patient Stated       Patient will improve energy conservation and apply learned techniques to daily tasks.     Patient Stated       Patient will apply sleep hygiene techniques to her daily routine for improved sleep.        Post Clinical Reasoning: Clinician View Of Client Situation:: Ms. Raisanen has very low energy level due to cardiac and pulmonary issues.  She is aware of limitations and attempts to rest as much as possible while maintaining her activity level.  She will benefit from additional education on energy conservation and AE to aide in daily tasks. Client View Of His/Her Situation:: Breathing and upper body strength are her main barriers to being as independent as she would like.  she is receptive to and appreciative of Aging Gracefully services. Next Visit Plan:: Review how to safely get up from a fall.  Problem solve goal 1 - energy conservation .  Heather HILARIO Elbe, MHA, OT/L [phone]

## 2023-07-21 NOTE — Patient Instructions (Signed)
 Nice to meet you  Please try stronger inhaler since albuterol  helps some,  Use Symbicort 2 puffs in the morning 2 puffs in the evening.  Rinse your mouth out thoroughly with water after every use.  Use every day twice a day for the next 4 weeks.  If is not helping much sending a message and then we can try something different.  Something stronger.  I know it has not helped much in the past but lets make sure we take it twice a day every day to make sure.  We will get pulmonary function test scheduled for you today, next available at your convenience  Return to clinic in 3 months or sooner as needed with Dr. Annella

## 2023-07-26 DIAGNOSIS — H04123 Dry eye syndrome of bilateral lacrimal glands: Secondary | ICD-10-CM | POA: Diagnosis not present

## 2023-07-26 DIAGNOSIS — H524 Presbyopia: Secondary | ICD-10-CM | POA: Diagnosis not present

## 2023-07-26 DIAGNOSIS — Z0100 Encounter for examination of eyes and vision without abnormal findings: Secondary | ICD-10-CM | POA: Diagnosis not present

## 2023-07-26 DIAGNOSIS — H2513 Age-related nuclear cataract, bilateral: Secondary | ICD-10-CM | POA: Diagnosis not present

## 2023-07-27 ENCOUNTER — Encounter: Payer: Self-pay | Admitting: Cardiology

## 2023-07-27 ENCOUNTER — Ambulatory Visit: Attending: Cardiovascular Disease | Admitting: Cardiology

## 2023-07-27 VITALS — BP 100/60 | HR 71 | Resp 16 | Ht 61.0 in | Wt 162.6 lb

## 2023-07-27 DIAGNOSIS — E7800 Pure hypercholesterolemia, unspecified: Secondary | ICD-10-CM | POA: Diagnosis not present

## 2023-07-27 DIAGNOSIS — R002 Palpitations: Secondary | ICD-10-CM | POA: Diagnosis not present

## 2023-07-27 DIAGNOSIS — I10 Essential (primary) hypertension: Secondary | ICD-10-CM

## 2023-07-27 MED ORDER — ROSUVASTATIN CALCIUM 20 MG PO TABS: 20.0000 mg | ORAL_TABLET | Freq: Every morning | ORAL | 1 refills | Status: AC

## 2023-07-27 MED ORDER — SPIRONOLACTONE 25 MG PO TABS: 25.0000 mg | ORAL_TABLET | Freq: Every morning | ORAL | 1 refills | Status: DC

## 2023-07-27 MED ORDER — VERAPAMIL HCL ER 120 MG PO TBCR: 120.0000 mg | EXTENDED_RELEASE_TABLET | Freq: Every day | ORAL | 1 refills | Status: AC

## 2023-07-27 MED ORDER — LOSARTAN POTASSIUM 25 MG PO TABS: 25.0000 mg | ORAL_TABLET | Freq: Every morning | ORAL | 3 refills | Status: AC

## 2023-07-27 NOTE — Telephone Encounter (Signed)
 PFT has been scheduled on 10/25/23 before appt with Dr. Annella. Patient is aware cpap order was sent to [facility] here in [place] on 07/20/23

## 2023-07-27 NOTE — Progress Notes (Signed)
 Cardiology Office Note:  .   Date:  07/27/2023  ID:  Pamela Daniel, DOB [birth year], MRN [id number] PCP: Larnell Hamilton, MD  [facility] Providers Cardiologist:  Gordy Bergamo, MD   History of Present Illness: .   Pamela Daniel is a 73 y.o. Caucasian female patient with history of PSVT ablation in 2011, PAF presently on sotalol , morbid obesity and OSA on CPAP, primary hypertension, hypercholesterolemia, COPD secondary to secondhand tobacco use exposure, chronic dyspnea which has been worsening recently and also dizziness presents for a 66-month office visit.  She has normal coronary arteries by cardiac catheterization in 21 and also coronary CT angiogram in December 2023.  Discussed the use of AI scribe software for clinical note transcription with the patient, who gave verbal consent to proceed.  History of Present Illness Pamela Daniel is a 73 year old female with atrial fibrillation and hypertension who presents for follow-up regarding her cardiovascular health and management of hypertension and palpitations. She experiences occasional palpitations described as 'skipping' or 'flipping, flopping' sensations, lasting brief episodes, do not last for hours. She recently completed a heart monitor test. She is currently off sotalol  and Eliquis  since last office visit 2 months ago as there was no definite evidence of atrial fibrillation.  She is taking verapamil , which is effective in controlling her occasional skipped beats and palpitations. She noted low blood pressure today, causing her to feel 'kind of odd.' She experienced dizziness yesterday but did not check her blood pressure at that time. She is not using furosemide  daily, only as needed, and is on spironolactone  and losartan  50 mg, which she tolerates well. Her weight has stabilized after losing approximately 25 pounds on Wegovy, and she aims to lose an additional 10 pounds.  Labs   No results found for: LIPOA  Lab Results   Component Value Date   CHOL 176 03/16/2015   HDL 61 03/16/2015   LDLCALC 97 03/16/2015   TRIG 92 03/16/2015   CHOLHDL 2.9 03/16/2015   No results found for: LIPOA  Lab Results  Component Value Date   NA 138 01/02/2022   K 4.1 01/02/2022   CO2 22 01/02/2022   GLUCOSE 96 01/02/2022   BUN 21 01/02/2022   CREATININE 0.93 01/02/2022   CALCIUM  9.6 01/02/2022   GFRNONAA >60 01/02/2022      Latest Ref Rng & Units 01/02/2022    2:25 PM 02/06/2021   10:27 AM 08/22/2020   11:50 AM  BMP  Glucose 70 - 99 mg/dL 96  790  877   BUN 8 - 23 mg/dL 21  13  10    Creatinine 0.44 - 1.00 mg/dL 9.06  9.09  9.29   Sodium 135 - 145 mmol/L 138  136  138   Potassium 3.5 - 5.1 mmol/L 4.1  4.1  4.0   Chloride 98 - 111 mmol/L 104  104  105   CO2 22 - 32 mmol/L 22  25  25    Calcium  8.9 - 10.3 mg/dL 9.6  9.1  9.0       Latest Ref Rng & Units 01/02/2022    2:25 PM 02/06/2021   10:27 AM 10/14/2020    2:58 PM  CBC  WBC 4.0 - 10.5 K/uL 4.5  6.0  5.0   Hemoglobin 12.0 - 15.0 g/dL 84.1  85.8  85.3   Hematocrit 36.0 - 46.0 % 47.8  42.1  43.1   Platelets 150 - 400 K/uL 195  201  225.0  Lab Results  Component Value Date   HGBA1C 5.6 12/22/2011    Lab Results  Component Value Date   TSH 2.824 08/22/2020    External Labs:  ROS  Review of Systems  Cardiovascular:  Positive for palpitations (occasional brief skipped beats). Negative for chest pain, dyspnea on exertion and leg swelling.    Physical Exam:   VS:  BP 100/60 (BP Location: Left Arm, Patient Position: Sitting, Cuff Size: Large)   Pulse 71   Resp 16   Ht 5' 1 (1.549 m)   Wt 162 lb 9.6 oz (73.8 kg)   SpO2 97%   BMI 30.72 kg/m    Wt Readings from Last 3 Encounters:  07/27/23 162 lb 9.6 oz (73.8 kg)  07/21/23 163 lb (73.9 kg)  06/28/23 163 lb (73.9 kg)    Physical Exam Constitutional:      Appearance: She is obese.  Neck:     Vascular: No carotid bruit or JVD.  Cardiovascular:     Rate and Rhythm: Normal rate and regular  rhythm.     Pulses: Intact distal pulses.     Heart sounds: Normal heart sounds. No murmur heard.    No gallop.  Pulmonary:     Effort: Pulmonary effort is normal.     Breath sounds: Normal breath sounds.  Abdominal:     General: Bowel sounds are normal.     Palpations: Abdomen is soft.  Musculoskeletal:     Right lower leg: No edema.     Left lower leg: No edema.    Studies Reviewed: SABRA     EKG:         Medications ordered    Meds ordered this encounter  Medications   losartan  (COZAAR ) 25 MG tablet    Sig: Take 1 tablet (25 mg total) by mouth every morning.    Dispense:  90 tablet    Refill:  3   rosuvastatin  (CRESTOR ) 20 MG tablet    Sig: Take 1 tablet (20 mg total) by mouth every morning.    Dispense:  90 tablet    Refill:  1    This prescription was filled on 06/24/2022. Any refills authorized will be placed on file.   spironolactone  (ALDACTONE ) 25 MG tablet    Sig: Take 1 tablet (25 mg total) by mouth every morning.    Dispense:  90 tablet    Refill:  1    This prescription was filled on 06/24/2022. Any refills authorized will be placed on file.   verapamil  (CALAN -SR) 120 MG CR tablet    Sig: Take 1 tablet (120 mg total) by mouth at bedtime.    Dispense:  90 tablet    Refill:  1    Discontinue Sotalol      ASSESSMENT AND PLAN: .      ICD-10-CM   1. Primary hypertension  I10 losartan  (COZAAR ) 25 MG tablet    spironolactone  (ALDACTONE ) 25 MG tablet    2. Palpitations  R00.2 verapamil  (CALAN -SR) 120 MG CR tablet    3. Pure hypercholesterolemia  E78.00 rosuvastatin  (CRESTOR ) 20 MG tablet      Assessment and Plan Assessment & Plan Palpitations Intermittent palpitations with occasional skipped heartbeats.  Suspect PACs and PVCs to be etiology.  Patient just finished wearing her event monitor for 2 weeks, has mailed it yesterday and will await uploading for me to read. Previous brief episode of arrhythmia when she wore Holter monitor about a year ago could be  atrial fibrillation or  brief atrial tachycardia, but not definitive. Off sotalol  and Eliquis  in the absence of definite sustained atrial fibrillation, currently on verapamil  with improvement symptoms of palpitations that occur briefly and described as skipped beats.  - Blood thinners and sotalol  are high-risk medications, and her use is being carefully considered. - Review Holter monitor results when available - Continue verapamil   Dizziness Recent episodes of dizziness, possibly related to hypotension. Dizziness occurred three times yesterday. Currently on losartan , spironolactone , and furosemide  as needed. Adjustments to medication regimen are being made to address low blood pressure. - Reduce losartan  to 25 mg daily - Use furosemide  only as needed  Hypertension Blood pressure has been low recently, contributing to dizziness. Currently on losartan , spironolactone , and furosemide  as needed. Adjustments to medication regimen are being made to address low blood pressure. - Reduce losartan  to 25 mg daily - In view of weight loss, could consider reducing the dose of spironolactone  to 12.5 mg daily as well  Obesity Weight loss of approximately 25 pounds, currently stabilized. Desires to lose an additional 10 pounds. On Wegovy, which aids in weight loss by improving signaling between the stomach and brain. Discussed strategies to enhance weight loss, including eating slowly, taking breaks during meals, and increasing physical activity. - Continue Birch.Brandt - Implement eating strategies: eat slowly, take breaks during meals - Increase physical activity by adding 10 minutes to current routine  Unless I see significant abnormality on her event monitor, I will see her back on a as needed basis.  Patient felt reassured.  Signed,  Gordy Bergamo, MD, [facility] 07/27/2023, 8:29 PM [facility] [address] [place], [place] [address] Phone: [phone]. Fax:  [phone]

## 2023-07-27 NOTE — Patient Instructions (Signed)
 Medication Instructions:  Your physician has recommended you make the following change in your medication: Decrease Losartan  to 25 mg by mouth daily   *If you need a refill on your cardiac medications before your next appointment, please call your pharmacy*  Lab Work: none If you have labs (blood work) drawn today and your tests are completely normal, you will receive your results only by: MyChart Message (if you have MyChart) OR A paper copy in the mail If you have any lab test that is abnormal or we need to change your treatment, we will call you to review the results.  Testing/Procedures: none  Follow-Up: At [facility], you and your health needs are our priority.  As part of our continuing mission to provide you with exceptional heart care, our providers are all part of one team.  This team includes your primary Cardiologist (physician) and Advanced Practice Providers or APPs (Physician Assistants and Nurse Practitioners) who all work together to provide you with the care you need, when you need it.  Your next appointment:   As needed  Provider:   Gordy Bergamo, MD    We recommend signing up for the patient portal called MyChart.  Sign up information is provided on this After Visit Summary.  MyChart is used to connect with patients for Virtual Visits (Telemedicine).  Patients are able to view lab/test results, encounter notes, upcoming appointments, etc.  Non-urgent messages can be sent to your provider as well.   To learn more about what you can do with MyChart, go to [email or web address].   Other Instructions

## 2023-08-02 ENCOUNTER — Other Ambulatory Visit: Payer: Self-pay | Admitting: *Deleted

## 2023-08-02 ENCOUNTER — Encounter: Payer: Self-pay | Admitting: *Deleted

## 2023-08-02 NOTE — Patient Instructions (Signed)
 Visit Information  Thank you for taking time to visit with me today. Please don't hesitate to contact me if I can be of assistance to you before our next scheduled home appointment.  Following are the goals we discussed today:   Goals Addressed               This Visit's Progress     [name]        08/02/23  Assessment: Pamela Daniel reports having chronic sob and dizziness. She has close follow up with her PCP, pulmonologist, and cardiologist. She reports having 3 falls this year. She uses a rollator as needed. States she has AFIB and COPD. Wears CPAP at night and as needed. Has PFT scheduled in October. Recent medication adjustments made. Reports carrying her phone with her at all times. Does not have a medical alert system. However, her daughter checks on her frequently and takes Pamela Daniel to appointments. Pamela Daniel still drives but states she does not when she is not feeling well. Endorses taking frequent rest breaks  and naps due to sob or dizziness.   Intervention: Provided booklet with chronic conditions and discussed in detail when to call MD or 911 regarding symptoms related to COPD, CHF, AFIB. Encouraged Pamela Daniel to take medications consistently and as prescribed. Discussed fall precautions. Discussed referral for VBCI CCM services. Pamela Daniel states she would rather wait on VBCI referral being made at this time. States it's so much going on. States we can discuss referral at later time.  Plan: Scheduled next follow up visit for Thursday, August 21st 1pm. Will discuss VBCI CCM referral for complex care management needs closer to end of AG RN home visits.  CLIENT/RN ACTION PLAN - FALL PREVENTION  Registered Nurse:  Pablo Hurst Date: 08/02/23  Client Name: Pamela Daniel Client ID:    Target Area:  FALL PREVENTION    Why Problem May Occur: When dizzy    Target Goal: No falls over the next 160 days   STRATEGIES Coping Strategies Ideas  Change Positions  Slowly: lying to sitting, sitting to standing Reviewed 08/02/23 Changing positions can make people lightheaded. When getting up in the morning sit for a few minutes, before standing.  Stand for a few minutes before walking and hold on to sturdy furniture or countertop.    Drink water Reviewed 08/02/23 Dehydration can make people dizzy. Ask your healthcare provider how much water you can drink. Decrease caffeine, caffeine makes you urinate a lot, which can make you dehydrated.   If you fall, tell someone Reviewed 08/02/23 Tell a friend or family member even if you didn't get hurt. Tell your healthcare provider if you fall.  They can help you figure out why.   Get your vision and hearing checked Reviewed 08/02/23 Poor vision and hearing loss can make people fall. Glaucoma and diabetes can cause poor vision.   Other    Prevention Ideas  Review your Medicines Reviewed 08/02/23 Many medicines can make you dizzy or sleepy and increase your risk of falling. Your Aging Gracefully Nurse will look at your medications and see if you are taking any that might cause falling. Take medications as prescribed  Activity and Exercise Reviewed 08/02/23 Aging Gracefully exercises Walking (inside or outside) [employer]:  cooking, cleaning, laundry Exercise while watching TV Swimming or water aerobics.   Strengthen Bones Reviewed 08/02/23 Exercise makes your bones stronger. Vitamin D  and Calcium  make your bones stronger.  Ask your Healthcare provider if  you should be taking them. Your body makes vitamin D  from the sun.  Sit in the sun for 10 minutes every day (without sunscreen).   Control your urine  Reviewed 08/02/23 Prevent constipation Cut back on caffeinated drinks. Don't wait to urinate. If diabetic, control your blood sugar. Ask your Aging Gracefully Nurse and Healthcare Provider  about urine control.   Control your blood sugar (if you are diabetic) High blood sugar can cause  frequent urination, poor vision, and numbness in your feet, which can make you fall.   Low blood sugar can cause confusion and dizziness.   Other coping strategies 1. 2. 3. 4. 5.   PRACTICE It is important to practice the strategies so we can determine if they will be effective in helping to reach the goal.    Follow these specific recommendations:        If strategy does not work the first time, try it again.     We may make some changes over the next few sessions.       Pablo Hurst, MSN, RN, BSN [place]  [facility], Healthy Communities RN Case Manager for Aging Gracefully Direct Dial: [phone]                                      Our next appointment is on August 21st at 1pm.  If you are experiencing a Mental Health or Behavioral Health Crisis or need someone to talk to, please call the Suicide and Crisis Lifeline: 988 call the [place]  National Suicide Prevention Lifeline: 506-010-7108 or TTY: 6131272377 TTY 5636157676) to talk to a trained counselor call 1-800-273-TALK (toll free, 24 hour hotline) go to [facility] [address] [place] [phone]) call 911   The patient verbalized understanding of instructions, educational materials, and care plan provided today and agreed to receive a mailed copy of patient instructions, educational materials, and care plan.   Pablo Hurst, MSN, RN, BSN [place]  [facility], Healthy Communities RN Post- Acute Care Manager Direct Dial: [phone]

## 2023-08-04 DIAGNOSIS — R002 Palpitations: Secondary | ICD-10-CM | POA: Diagnosis not present

## 2023-08-05 NOTE — Patient Outreach (Signed)
 Aging Gracefully Program  RN Visit  08/05/2023  Pamela Daniel 03/07/1950 [id number]  Visit:  RN Visit Number: 1- Initial Visit  RN TIME CALCULATION: Start TIme:  RN Start Time Calculation: 1000 End Time:  RN Stop Time Calculation: 1130 Total Minutes:  RN Time Calculation: 90  Readiness To Change Score:  Readiness to Change Score: 10  Universal RN Interventions: Calendar Distribution: Yes Exercise Review: No Medications: Yes Medication Changes: Yes Mood: Yes Pain: Yes PCP Advocacy/Support: No Fall Prevention: Yes Incontinence: Yes Clinician View Of Client Situation: Arrived for home visit. Pamela Daniel was sitting on porch waiting for client. Pamela Daniel ambulates independently without assistive device. Cluttered porch. Home neat and without clutter in the front of the house. Client View Of His/Her Situation: Pamela Daniel reports having dizziness on and off. Denies any recent falls. Endorses medication changes and close follow up with her physicians.  Healthcare Provider Communication: Did Surveyor, mining With [employer] Provider?: No Healthcare Provider Response According to RN: n/a According to Client, Did PCP Report Communication With An Aging Gracefully RN?: No Healthcare Provider Response According To Client: n/a  Clinician View of Client Situation: Clinician View Of Client Situation: Arrived for home visit. [name] was sitting on porch waiting for client. [name] ambulates independently without assistive device. Cluttered porch. Home neat and without clutter in the front of the house. Client's View of His/Her Situation: Client View Of His/Her Situation: Pamela Daniel reports having dizziness on and off. Denies any recent falls. Endorses medication changes and close follow up with her physicians.  Medication Assessment: Do You Have Any Problems Paying For Medications?: No Where Does Client Store Medications?: Other: (bedroom) Can Client Read Pill Bottles?:  Yes Does Client Use A Pillbox?: No Does Anyone Assist Client In Taking Medications?: No Do You Take Vitamin D ?: No Does Client Have Any Questions Or Concerns About Medictions?: No Is Client Complaining Of Any Symptoms That Could Be Side Effects To Medications?: No Any Possible Changes In Medication Regimen?: Yes  OT Update: Pending [facility] contracts and assessments.  Session Summary: [name] follows closely with her physicians and specialists. Frequently experiences episodes of not feeling well prompting her lay down.   Goals Addressed               This Visit's Progress     [name]        08/02/23  Assessment: Pamela Daniel reports having chronic sob and dizziness. She has close follow up with her PCP, pulmonologist, and cardiologist. She reports having 3 falls this year. She uses a rollator as needed. States she has AFIB and COPD. Wears CPAP at night and as needed. Has PFT scheduled in October. Recent medication adjustments made. Reports carrying her phone with her at all times. Does not have a medical alert system. However, her daughter checks on her frequently and takes Pamela Daniel to appointments. [name] still drives but states she does not when she is not feeling well. Endorses taking frequent rest breaks  and naps due to sob or dizziness.   Intervention: Provided booklet with chronic conditions and discussed in detail when to call MD or 911 regarding symptoms related to COPD, CHF, AFIB. Encouraged Pamela Daniel to take medications consistently and as prescribed. Discussed fall precautions. Discussed referral for VBCI CCM services. Ms. Bayles states she would rather wait on VBCI referral being made at this time. States it's so much going on. States we can discuss referral at later time.  Plan:  Scheduled next follow up visit for Thursday, August 21st 10am. Will discuss VBCI CCM referral for complex care management needs closer to end of AG RN home visits.  CLIENT/RN  ACTION PLAN - FALL PREVENTION  Registered Nurse:  Pablo Hurst Date: 08/02/23  Client Name: Pamela Daniel Client ID:    Target Area:  FALL PREVENTION    Why Problem May Occur: When dizzy    Target Goal: No falls over the next 160 days   STRATEGIES Coping Strategies Ideas  Change Positions Slowly: lying to sitting, sitting to standing Reviewed 08/02/23 Changing positions can make people lightheaded. When getting up in the morning sit for a few minutes, before standing.  Stand for a few minutes before walking and hold on to sturdy furniture or countertop.    Drink water Reviewed 08/02/23 Dehydration can make people dizzy. Ask your healthcare provider how much water you can drink. Decrease caffeine, caffeine makes you urinate a lot, which can make you dehydrated.   If you fall, tell someone Reviewed 08/02/23 Tell a friend or family member even if you didn't get hurt. Tell your healthcare provider if you fall.  They can help you figure out why.   Get your vision and hearing checked Reviewed 08/02/23 Poor vision and hearing loss can make people fall. Glaucoma and diabetes can cause poor vision.   Other    Prevention Ideas  Review your Medicines Reviewed 08/02/23 Many medicines can make you dizzy or sleepy and increase your risk of falling. Your Aging Gracefully Nurse will look at your medications and see if you are taking any that might cause falling. Take medications as prescribed  Activity and Exercise Reviewed 08/02/23 Aging Gracefully exercises Walking (inside or outside) [employer]:  cooking, cleaning, laundry Exercise while watching TV Swimming or water aerobics.   Strengthen Bones Reviewed 08/02/23 Exercise makes your bones stronger. Vitamin D  and Calcium  make your bones stronger.  Ask your Healthcare provider if you should be taking them. Your body makes vitamin D  from the sun.  Sit in the sun for 10 minutes every day (without sunscreen).   Control your  urine  Reviewed 08/02/23 Prevent constipation Cut back on caffeinated drinks. Don't wait to urinate. If diabetic, control your blood sugar. Ask your Aging Gracefully Nurse and Healthcare Provider  about urine control.   Control your blood sugar (if you are diabetic) High blood sugar can cause frequent urination, poor vision, and numbness in your feet, which can make you fall.   Low blood sugar can cause confusion and dizziness.   Other coping strategies 1. 2. 3. 4. 5.   PRACTICE It is important to practice the strategies so we can determine if they will be effective in helping to reach the goal.    Follow these specific recommendations:        If strategy does not work the first time, try it again.     We may make some changes over the next few sessions.       Pablo Hurst, MSN, RN, BSN [place]  [facility], Healthy Communities RN Case Manager for Aging Gracefully Direct Dial: [phone]

## 2023-08-07 ENCOUNTER — Ambulatory Visit: Payer: Self-pay | Admitting: Cardiology

## 2023-08-07 DIAGNOSIS — R002 Palpitations: Secondary | ICD-10-CM | POA: Diagnosis not present

## 2023-08-07 NOTE — Progress Notes (Signed)
 Dear Pamela Daniel, I reviewed your chart, reviewed your event monitor.  Your episodes of palpitations that is rapid heartbeat correlated with frequent brief episodes of SVT.  You have had SVT ablation in the remote past and now you are having recurrence however they are very brief.  As long as your symptoms are controlled with verapamil  that you are taking, and episodes are very brief, would recommend continued medical therapy for now. We could also consider addition of a second agent metoprolol  succinate 25 mg daily to decrease your symptoms of SVT and palpitations.  If you agree I will send a Rx.  There is no atrial fibrillation.  No other significant arrhythmias or heart block is noted.  Study is very reassuring.

## 2023-08-08 DIAGNOSIS — M25512 Pain in left shoulder: Secondary | ICD-10-CM | POA: Diagnosis not present

## 2023-08-08 DIAGNOSIS — I10 Essential (primary) hypertension: Secondary | ICD-10-CM | POA: Diagnosis not present

## 2023-08-08 DIAGNOSIS — K219 Gastro-esophageal reflux disease without esophagitis: Secondary | ICD-10-CM | POA: Diagnosis not present

## 2023-08-08 MED ORDER — METOPROLOL SUCCINATE ER 25 MG PO TB24
25.0000 mg | ORAL_TABLET | Freq: Every day | ORAL | 2 refills | Status: AC
Start: 2023-08-08 — End: ?

## 2023-08-17 ENCOUNTER — Encounter: Payer: Self-pay | Admitting: Specialist

## 2023-08-18 ENCOUNTER — Ambulatory Visit (INDEPENDENT_AMBULATORY_CARE_PROVIDER_SITE_OTHER): Admitting: Family Medicine

## 2023-08-18 ENCOUNTER — Other Ambulatory Visit (HOSPITAL_COMMUNITY)
Admission: RE | Admit: 2023-08-18 | Discharge: 2023-08-18 | Disposition: A | Discharge status: Home / Self Care | Source: Ambulatory Visit | Attending: Family Medicine | Admitting: Family Medicine

## 2023-08-18 VITALS — BP 137/78 | HR 73 | Wt 162.0 lb

## 2023-08-18 DIAGNOSIS — N904 Leukoplakia of vulva: Secondary | ICD-10-CM | POA: Diagnosis not present

## 2023-08-18 DIAGNOSIS — N890 Mild vaginal dysplasia: Secondary | ICD-10-CM

## 2023-08-18 NOTE — Progress Notes (Signed)
     GYNECOLOGY OFFICE COLPOSCOPY PROCEDURE NOTE  73 y.o. [id number] here for colposcopy for low-grade squamous intraepithelial neoplasia (LGSIL - encompassing HPV,mild dysplasia,CIN I) pap smear on 06/28/2023. Discussed role for HPV in cervical dysplasia, need for surveillance.  Patient gave informed written consent, time out was performed. Patient was placed in lithotomy position. Cervix viewed with speculum and colposcope after application of acetic acid.   Colposcopy adequate? Yes  acetowhite lesion(s) noted at right posterior fornix; corresponding biopsies obtained.   All specimens were labeled and sent to pathology.  Chaperone was present during entire procedure.  Patient was given post procedure instructions.  Will follow up pathology and manage accordingly; patient will be contacted with results and recommendations.  Routine preventative health maintenance measures emphasized.   For lichen sclerosis, photos obtained. Her symtpoms are much improved. Continue clobetasol .     Glenys GORMAN Birk, MD 08/18/2023 10:15 AM

## 2023-08-18 NOTE — Progress Notes (Signed)
Pt here today for colpo.

## 2023-08-22 LAB — SURGICAL PATHOLOGY

## 2023-08-23 ENCOUNTER — Ambulatory Visit: Payer: Self-pay | Admitting: Family Medicine

## 2023-08-26 ENCOUNTER — Other Ambulatory Visit: Payer: Self-pay | Admitting: Specialist

## 2023-08-29 NOTE — Patient Outreach (Signed)
 Aging Gracefully Program  OT Follow-Up Visit  08/29/2023  Pamela Daniel 09/18/1950 [id number]  Visit:  2- Second Visit  Start Time:  1600 End Time:  1650 Total Minutes:  50  Readiness to Change Score :  Readiness to Change Score: 10  Home Environment Assessment:    Durable Medical Equipment:    Patient Education: Education Provided: Yes Education Details: educated patient on how to safely get up from a fall Person(s) Educated: Patient Comprehension: Verbalized Understanding, Returned Demonstration  Goals:   Goals Addressed             This Visit's Progress    Patient Stated       Patient will improve energy conservation and apply learned techniques to daily tasks.  Energy Conservation What is Printmaker? After being in the hospital, it is normal to feel tired and weak. You may  also feel short of breath and have less energy to do the activities you  are used to doing at home. Learning how to conserve your energy  helps you build up your strength to take part in your daily activities and  other things you enjoy doing.  When you learn to conserve energy, you also reduce strain on your  heart, fatigue, shortness of breath and stress related pain.  Learning to conserve your energy is all about finding a good balance  between work, rest and leisure in order to decrease the amount of  energy demand on your body. Remember and Practice the 4 Ps 1. Prioritize Decide what needs to be done today and what can be done at a later  date or time. For example, going to a doctor's appointment would take  priority over dusting the living room. When you have more than one thing to do, begin with the most  important to make sure it gets done.  2. Plan Plan your activities first to avoid extra trips. Gather the supplies and  equipment you need before doing the job. For example, get your  garden supplies and tools ready before you start to plant flowers.  Plan to  alternate heavy and light tasks. Plan activities throughout the week to avoid doing too many activities  in one day. Put a schedule on the refrigerator to remind you and  others who is doing what. Plan to get a good rest each night. Use family and friends or pay for help to complete tasks you may  struggle with or that require too much energy.  3. Pace Maintain a slow and steady pace. Never rush.  2 3. Pace (continued) Rest often. Rest before you feel tired.  Avoid holding your breath. Practice breathing slow and steady. Use pursed lip breathing. Breathe in through your nose for a count of  2 and out from your mouth for a count of 4. This is like blowing out a  candle on a cake. Remember that you may have to ask for help to do some tasks and  that is okay.  Listen to your body and know your limits. 4. Position Too much bending and reaching can cause fatigue and shortness of  breath. Use a reacher, sock aid, long handled shoe horn and/or  elastic shoelaces. Avoid bending and reaching too much. Always maintain a nice upright posture when sitting and  standing. This helps you get more oxygen into your lungs and  around your body to work better.  Sit when you can. Sitting supports your body so you can focus  on your breathing and activities while conserving your energy.  Sitting reduces energy use by 25%. Energy Conservation Tips Dressing and Hygiene Sit when you can. Organize and lay out clothing the night before. Begin dressing your lower half first as this uses more energy.  Avoid bending and reaching. Instead, use a reacher, sock aid  or long handled shoe horn or lift your legs up onto the bed or chair. Dry off with terry cloth robe. You use less energy than drying off with  a towel.  If you have a weaker limb or limbs, it is easier to dress the weaker limb first. It is easier to undress your strong limb first. Wear clothes that are easy to put on and take off. For example,  use clothes and shoes with velcro instead of small buttons, clasps or  laces. 3 Avoid using scented products such as hair products and lotions.  These can irritate your lungs and cause shortness of breath for you  and others around you. Many people are allergic to scents. These  types of products are not allowed in the hospital. Be cautious when bathing. Use warm, not hot water. This helps eliminate  shortness of breath from a buildup of steam and condensation. Use the bathroom equipment suggested by your Occupational  Therapist. For example using a bath bench, bath stool, grab bars or a  raised toilet seat can make bathing and toileting easier and safer.  Shopping Bring a prepared list of things you need to buy.  Organize your shopping list by aisle or section of the  store.  Transport items in a buggy or shopping cart rather than  carrying them in a basket.  Load and carry grocery bags that are only half full or shop with  someone who can help pack and carry bags.  Avoid going out during rush hour when stores and streets are crowded. Consider using a delivery service. Housework Divide activities and do them throughout the week. Balance light with  heavy tasks.  Make one side of the bed at a time. Sit to change pillow cases and unfold linen. Avoid spray cleaners that may irritate your lungs. Clean the bathtub by sitting or kneeling. Clean one whole room at a time instead of going back and forth  between rooms to do each job.  Consider asking for help from family members or  hiring a cleaning service or housekeeper.  After washing dishes, allow them to air dry. Have work in front of you rather than at your side. Slide rather than lift objects.  Use long handled dustpans and cleaning sponges to decrease the  need for bending.  4 Make a weekly plan for major jobs such as laundry, cleaning and  changing sheets on beds. Do one job each day.  Keep a trash can in every room to  avoid too much walking. Buy more than one of each item you use around the house.  For example, keep sink cleaner in the bathroom and kitchen.  Keep a vacuum on each level of your home.  Cooking Cook and bake in steps to reduce energy use. Gather all ingredients and utensils before starting. Plan ahead with meal preparation. Make large meals and freeze in servings for later use. Use lightweight cookware and dishes to conserve energy.  Use paper plates and cups to eliminate dishwashing.  Use electric appliances such as can openers, blenders, food  processors and dishwasher to conserve energy.  Consider buying easy to prepare or frozen  meals, or using a meal  delivery service. Key Points: Prioritize activities of the day. Do heavier tasks when you have more energy.  Plan your days' and weeks' activities. Set up your work area so you do not  have to move around a lot looking for items to complete the task. Plan rest  times.  Pace yourself. Do not try to complete the whole task in one session. Break it  into smaller, easy to do steps. A good guide to follow is to take 10 minutes  each hour to rest. Do not rush. Position and Posture are important. Sit to work when you can to use 25%  less energy. Sit and stand as upright as you can. Practice deep breathing  exercises while you work to maintain your breathing rate and stay relaxed. Use assistive devices when recommended to save energy and make it more comfortable and easy taking care of yourself. Remember. The most important energy conservation tip is to listen to your body. Stop and rest BEFORE you get tired. Plan rest times. Rest often.        Post Clinical Reasoning: Client Action (Goal) One Interventions: Patient educated on 4 P's of Energy Conservation:  Pace, Building control surveyor, Position, Plan.  Patient was able to voice suggestions that aligned with each area, as well as was receptive to suggestions provided by OT. Did Client Try?:  Yes Targeted Problem Area Status: A Little Better Clinician View Of Client Situation:: Ms. Shepheard has made significant improvements in her energy level, cognition, and overall health!  She has begun seeing a new cardiologist and feels much better.  She also had a sleep study and has a new CPAP and feels she is sleeping much better.  She really looked and sounded like a different person during this visit.  she acknowledges that she still fatigues, just not as easily.  She now needs 1-2 naps per day versus several a month ago. Client View Of His/Her Situation:: States she knows and feels she is doing much better.  She has made signficant positive changes in her medical condition and plans to continue to do so.  She is excited about learning new strategies for energy conservation and is open to applying them to her daily tasks. Next Visit Plan:: Patient to continue implementing the 4 P's plan, prioritize, pace, position.  At next session, OT to provide AE and DME that will further assist with energy conservation.  OT to follow up with [facility] on timeline for Home Repair visit to finalize repairs.  Heather HILARIO Elbe, MHA, OT/L [phone]

## 2023-09-07 DIAGNOSIS — M7542 Impingement syndrome of left shoulder: Secondary | ICD-10-CM | POA: Diagnosis not present

## 2023-09-08 ENCOUNTER — Other Ambulatory Visit: Payer: Self-pay | Admitting: *Deleted

## 2023-09-08 NOTE — Patient Outreach (Signed)
 Aging Gracefully Program  RN Visit  09/08/2023  Pamela Daniel 26-Jun-1950 [id number]  Visit:  RN Visit Number: 2- Second Visit  RN TIME CALCULATION: Start TIme:  RN Start Time Calculation: 1000 End Time:  RN Stop Time Calculation: 1100 Total Minutes:  RN Time Calculation: 60  Readiness To Change Score:  Readiness to Change Score: 10  Universal RN Interventions: Calendar Distribution: No Exercise Review: Yes Medications: Yes Medication Changes: No Mood: Yes Pain: Yes PCP Advocacy/Support: No Fall Prevention: Yes Incontinence: Yes Clinician View Of Client Situation: Arrived for home visit. Pamela Daniel answered the door with cheerful smile and warm, upbeat demeanor. Floor free of clutter. Pamela Daniel ambulating independently without assistive device. Pamela Daniel well groomed and hair recenlty colored and styled. Client View Of His/Her Situation: Pamela Daniel is delighted that she is feeling extremely better. States ever since her cardioligst changed and discontinued medications, she is doing so much better. Denies dizziness, falls, or pain  Healthcare Provider Communication: Did Surveyor, mining With [employer] Provider?: No Healthcare Provider Response According to RN: n/a According to Client, Did PCP Report Communication With An Aging Gracefully RN?: No Healthcare Provider Response According To Client: n/a  Clinician View of Client Situation: Clinician View Of Client Situation: Arrived for home visit. Pamela Daniel answered the door with cheerful smile and warm, upbeat demeanor. Floor free of clutter. Pamela Daniel ambulating independently without assistive device. Pamela Daniel well groomed and hair recenlty colored and styled. Client's View of His/Her Situation: Client View Of His/Her Situation: Pamela Daniel is delighted that she is feeling extremely better. States ever since her cardioligst changed and discontinued medications, she is doing so much better. Denies dizziness,  falls, or pain  Medication Assessment: Reviewed    OT Update: Pending [facility] contracts and assessments  Session Summary: It is wonderful to see how much better Pamela Daniel is feeling and doing. Denies SOB, dizziness, fatigue, and weakness. Daughter Pamela Daniel was present during visit.    Goals Addressed               This Visit's Progress     [name]        08/02/23  Assessment: Pamela Daniel reports having chronic sob and dizziness. She has close follow up with her PCP, pulmonologist, and cardiologist. She reports having 3 falls this year. She uses a rollator as needed. States she has AFIB and COPD. Wears CPAP at night and as needed. Has PFT scheduled in October. Recent medication adjustments made. Reports carrying her phone with her at all times. Does not have a medical alert system. However, her daughter checks on her frequently and takes Pamela Daniel to appointments. Pamela Daniel still drives but states she does not when she is not feeling well. Endorses taking frequent rest breaks  and naps due to sob or dizziness.   Intervention: Provided booklet with chronic conditions and discussed in detail when to call MD or 911 regarding symptoms related to COPD, CHF, AFIB. Encouraged Pamela Daniel to take medications consistently and as prescribed. Discussed fall precautions. Discussed referral for [facility] CCM services. Pamela Daniel states she would rather wait on [facility] referral being made at this time. States it's so much going on. States we can discuss referral at later time.  Plan: Scheduled next follow up visit for Thursday, August 21st 10am. Will discuss [facility] CCM referral for complex care management needs closer to end of AG RN home visits.  CLIENT/RN ACTION PLAN - FALL PREVENTION  Registered Nurse:  Pablo Hurst Date: 08/02/23  Client Name: Pamela Daniel Client ID:    Target Area:  FALL PREVENTION    Why Problem May Occur: When dizzy    Target Goal: No falls over the next 160 days    STRATEGIES Coping Strategies Ideas  Change Positions Slowly: lying to sitting, sitting to standing Reviewed 08/02/23 Changing positions can make people lightheaded. When getting up in the morning sit for a few minutes, before standing.  Stand for a few minutes before walking and hold on to sturdy furniture or countertop.    Drink water Reviewed 08/02/23 Reviewed 09/08/23  Dehydration can make people dizzy. Ask your healthcare provider how much water you can drink. Decrease caffeine, caffeine makes you urinate a lot, which can make you dehydrated.   If you fall, tell someone Reviewed 08/02/23 Reviewed 09/08/23 Tell a friend or family member even if you didn't get hurt. Tell your healthcare provider if you fall.  They can help you figure out why.   Get your vision and hearing checked Reviewed 08/02/23 Reviewed 09/08/23 Poor vision and hearing loss can make people fall. Glaucoma and diabetes can cause poor vision.   Other    Prevention Ideas  Review your Medicines Reviewed 08/02/23 Reviewed 09/08/23 Many medicines can make you dizzy or sleepy and increase your risk of falling. Your Aging Gracefully Nurse will look at your medications and see if you are taking any that might cause falling. Take medications as prescribed  Activity and Exercise Reviewed 08/02/23 Reviewed 09/08/23 Aging Gracefully exercises Walking (inside or outside) [employer]:  cooking, Education officer, environmental, laundry Exercise while watching TV Swimming or water aerobics.   Strengthen Bones Reviewed 08/02/23 Reviewed 09/08/23 Exercise makes your bones stronger. Vitamin D  and Calcium  make your bones stronger.  Ask your Healthcare provider if you should be taking them. Your body makes vitamin D  from the sun.  Sit in the sun for 10 minutes every day (without sunscreen).   Control your urine  Reviewed 08/02/23 Reviewed 09/08/23 Prevent constipation Cut back on caffeinated drinks. Don't wait to urinate. If diabetic,  control your blood sugar. Ask your Aging Gracefully Nurse and Healthcare Provider  about urine control.   Control your blood sugar (if you are diabetic) High blood sugar can cause frequent urination, poor vision, and numbness in your feet, which can make you fall.   Low blood sugar can cause confusion and dizziness.   Other coping strategies 1. 2. 3. 4. 5.   PRACTICE It is important to practice the strategies so we can determine if they will be effective in helping to reach the goal.    Follow these specific recommendations:        If strategy does not work the first time, try it again.     We may make some changes over the next few sessions.       Pablo Hurst, MSN, RN, BSN [facility], [facility] RN Case Manager for Aging Gracefully Direct Dial: [phone]      09/08/23  Assessment: Ms. Baldini reports doing great overall. States she looks and feels like a new person since her cardiologist adjusted her medications. Reports recent results of her long term cardiac monitoring for 14 days did not show atrial fib but brief episodes of SVT. States she is being treated with medications and no further procedures are required at this time. Ms, Dorminey expresses gratitude and appreciation for her great team of doctors. States wearing her CPAP nightly also  helps her feel better. Denies any dizziness, fatigue, SOB, fatigue, or falls. Reports pain 0 out of 10. Reports having left shoulder injection on yesterday. States she is willing to engage with [facility] CCM team towards the end of AG RN visits. States she has multiple upcoming doctors' appointments. Therefore, she does not want to add anything else to her plate. Also states she does not answer unknown calls. If she does not answer the phone, she prefers text messages instead of voicemail messages.  Interventions: Demonstrated  Aging Gracefully home exercises. Ms. Clagg actively engaged for return  demonstration. Provided Aging Gracefully booklet and encouraged practice of exercises multiple times a day.  Discussed fall precautions. Celebrated with Ms. Tant for feeling and doing so much better. Discussed it was a night and day difference from last AG RN home visit vs today's visit. Ms. Scritchfield'.  Plan: Scheduled next home AG visit for 10/13/23  Pablo Hurst, MSN, RN, BSN [place]  [facility], [facility] RN Case Manager for Aging Gracefully Direct Dial: [phone]                                                     Pablo Hurst, MSN, RN, BSN [place]  [facility], [facility] RN Case Manager for Aging Gracefully Direct Dial: [phone]

## 2023-09-08 NOTE — Patient Instructions (Signed)
 Visit Information  Thank you for taking time to visit with me today. Please don't hesitate to contact me if I can be of assistance to you before our next scheduled home appointment.  Following are the goals we discussed today:   Goals Addressed               This Visit's Progress     [name]        08/02/23  Assessment: Pamela Daniel reports having chronic sob and dizziness. She has close follow up with her PCP, pulmonologist, and cardiologist. She reports having 3 falls this year. She uses a rollator as needed. States she has AFIB and COPD. Wears CPAP at night and as needed. Has PFT scheduled in October. Recent medication adjustments made. Reports carrying her phone with her at all times. Does not have a medical alert system. However, her daughter checks on her frequently and takes Pamela Daniel to appointments. Pamela Daniel still drives but states she does not when she is not feeling well. Endorses taking frequent rest breaks  and naps due to sob or dizziness.   Intervention: Provided booklet with chronic conditions and discussed in detail when to call MD or 911 regarding symptoms related to COPD, CHF, AFIB. Encouraged Pamela Daniel to take medications consistently and as prescribed. Discussed fall precautions. Discussed referral for [facility] CCM services. Pamela Daniel states she would rather wait on [facility] referral being made at this time. States it's so much going on. States we can discuss referral at later time.  Plan: Scheduled next follow up visit for Thursday, August 21st 10am. Will discuss [facility] CCM referral for complex care management needs closer to end of AG RN home visits.  CLIENT/RN ACTION PLAN - FALL PREVENTION  Registered Nurse:  Pablo Hurst Date: 08/02/23  Client Name: Pamela Daniel Client ID:    Target Area:  FALL PREVENTION    Why Problem May Occur: When dizzy    Target Goal: No falls over the next 160 days   STRATEGIES Coping Strategies Ideas  Change Positions  Slowly: lying to sitting, sitting to standing Reviewed 08/02/23 Changing positions can make people lightheaded. When getting up in the morning sit for a few minutes, before standing.  Stand for a few minutes before walking and hold on to sturdy furniture or countertop.    Drink water Reviewed 08/02/23 Reviewed 09/08/23  Dehydration can make people dizzy. Ask your healthcare provider how much water you can drink. Decrease caffeine, caffeine makes you urinate a lot, which can make you dehydrated.   If you fall, tell someone Reviewed 08/02/23 Reviewed 09/08/23 Tell a friend or family member even if you didn't get hurt. Tell your healthcare provider if you fall.  They can help you figure out why.   Get your vision and hearing checked Reviewed 08/02/23 Reviewed 09/08/23 Poor vision and hearing loss can make people fall. Glaucoma and diabetes can cause poor vision.   Other    Prevention Ideas  Review your Medicines Reviewed 08/02/23 Reviewed 09/08/23 Many medicines can make you dizzy or sleepy and increase your risk of falling. Your Aging Gracefully Nurse will look at your medications and see if you are taking any that might cause falling. Take medications as prescribed  Activity and Exercise Reviewed 08/02/23 Reviewed 09/08/23 Aging Gracefully exercises Walking (inside or outside) [employer]:  cooking, Education officer, environmental, laundry Exercise while watching TV Swimming or water aerobics.   Strengthen Bones Reviewed 08/02/23 Reviewed 09/08/23 Exercise makes your bones stronger. Vitamin  D and Calcium  make your bones stronger.  Ask your Healthcare provider if you should be taking them. Your body makes vitamin D  from the sun.  Sit in the sun for 10 minutes every day (without sunscreen).   Control your urine  Reviewed 08/02/23 Reviewed 09/08/23 Prevent constipation Cut back on caffeinated drinks. Don't wait to urinate. If diabetic, control your blood sugar. Ask your Aging Gracefully  Nurse and Healthcare Provider  about urine control.   Control your blood sugar (if you are diabetic) High blood sugar can cause frequent urination, poor vision, and numbness in your feet, which can make you fall.   Low blood sugar can cause confusion and dizziness.   Other coping strategies 1. 2. 3. 4. 5.   PRACTICE It is important to practice the strategies so we can determine if they will be effective in helping to reach the goal.    Follow these specific recommendations:        If strategy does not work the first time, try it again.     We may make some changes over the next few sessions.       Pablo Hurst, MSN, RN, BSN [facility], Healthy Communities RN Case Manager for Aging Gracefully Direct Dial: [phone]      09/08/23  Assessment: Pamela Daniel reports doing great overall. States she looks and feels like a new person since her cardiologist adjusted her medications. Reports recent results of her long term cardiac monitoring for 14 days did not show atrial fib but brief episodes of SVT. States she is being treated with medications and no further procedures are required at this time. Pamela Daniel, Pamela Daniel expresses gratitude and appreciation for her great team of doctors. States wearing her CPAP nightly also helps her feel better. Denies any dizziness, fatigue, SOB, fatigue, or falls. Reports pain 0 out of 10. Reports having left shoulder injection on yesterday. States she is willing to engage with [facility] CCM team towards the end of AG RN visits. States she has multiple upcoming doctors' appointments. Therefore, she does not want to add anything else to her plate. Also states she does not answer unknown calls. If she does not answer the phone, she prefers text messages instead of voicemail messages.  Interventions: Demonstrated  Aging Gracefully home exercises. Pamela Daniel actively engaged for return demonstration. Provided Aging Gracefully booklet and  encouraged practice of exercises multiple times a day.  Discussed fall precautions. Celebrated with Pamela Daniel for feeling and doing so much better. Discussed it was a night and day difference from last AG RN home visit vs today's visit. Pamela Daniel'. Provided bag of food as courtesy of [employer].  Plan: Scheduled next home AG visit for 10/13/23  Pablo Hurst, MSN, RN, BSN [place]  [facility], Healthy Communities RN Case Manager for Aging Gracefully Direct Dial: [phone]                                                      Our next appointment is on Sept. 25 th at 1030 am.  If you are experiencing a Mental Health or Behavioral Health Crisis or need someone to talk to, please call the Suicide and Crisis Lifeline: 988 call the [place]  National Suicide Prevention Lifeline: (478) 601-7441 or TTY: (949) 546-2349 TTY (941)553-8152) to talk  to a trained counselor call 1-800-273-TALK (toll free, 24 hour hotline) go to [facility] [address] [place] [phone]) call 911   The patient verbalized understanding of instructions, educational materials, and care plan provided today and agreed to receive a mailed copy of patient instructions, educational materials, and care plan.   Pablo Hurst, MSN, RN, BSN [place]  [facility], Healthy Communities RN Post- Acute Care Manager Direct Dial: [phone]

## 2023-09-15 ENCOUNTER — Other Ambulatory Visit: Payer: Self-pay | Admitting: Specialist

## 2023-09-16 NOTE — Patient Outreach (Signed)
 Aging Gracefully Program  OT Follow-Up Visit  09/16/2023  Pamela Daniel 29-Nov-1950 [id number]  Visit:     Start Time:  1630 End Time:  1730 Total Minutes:  60  Readiness to Change Score :     Home Environment Assessment:    Durable Medical Equipment: Adaptive Equipment: Rosabel Cordial, Dressing Stick, Long Handled Shoehorn, Reacher, [employer], Other (car cane) [employer] Date: 09/15/23  Patient Education: Education Provided: Yes Education Details: educated on how to utilize adaptive equipment  Goals:   Goals Addressed             This Visit's Progress    Patient Stated       Patient will improve energy conservation and apply learned techniques to daily tasks.  Energy Conservation What is Printmaker? After being in the hospital, it is normal to feel tired and weak. You may  also feel short of breath and have less energy to do the activities you  are used to doing at home. Learning how to conserve your energy  helps you build up your strength to take part in your daily activities and  other things you enjoy doing.  When you learn to conserve energy, you also reduce strain on your  heart, fatigue, shortness of breath and stress related pain.  Learning to conserve your energy is all about finding a good balance  between work, rest and leisure in order to decrease the amount of  energy demand on your body. Remember and Practice the 4 Ps 1. Prioritize Decide what needs to be done today and what can be done at a later  date or time. For example, going to a doctor's appointment would take  priority over dusting the living room. When you have more than one thing to do, begin with the most  important to make sure it gets done.  2. Plan Plan your activities first to avoid extra trips. Gather the supplies and  equipment you need before doing the job. For example, get your  garden supplies and tools ready before you start to plant flowers.   Plan to alternate heavy and light tasks. Plan activities throughout the week to avoid doing too many activities  in one day. Put a schedule on the refrigerator to remind you and  others who is doing what. Plan to get a good rest each night. Use family and friends or pay for help to complete tasks you may  struggle with or that require too much energy.  3. Pace Maintain a slow and steady pace. Never rush.  2 3. Pace (continued) Rest often. Rest before you feel tired.  Avoid holding your breath. Practice breathing slow and steady. Use pursed lip breathing. Breathe in through your nose for a count of  2 and out from your mouth for a count of 4. This is like blowing out a  candle on a cake. Remember that you may have to ask for help to do some tasks and  that is okay.  Listen to your body and know your limits. 4. Position Too much bending and reaching can cause fatigue and shortness of  breath. Use a reacher, sock aid, long handled shoe horn and/or  elastic shoelaces. Avoid bending and reaching too much. Always maintain a nice upright posture when sitting and  standing. This helps you get more oxygen into your lungs and  around your body to work better.  Sit when you can. Sitting supports your body so you can  focus  on your breathing and activities while conserving your energy.  Sitting reduces energy use by 25%. Energy Conservation Tips Dressing and Hygiene Sit when you can. Organize and lay out clothing the night before. Begin dressing your lower half first as this uses more energy.  Avoid bending and reaching. Instead, use a reacher, sock aid  or long handled shoe horn or lift your legs up onto the bed or chair. Dry off with terry cloth robe. You use less energy than drying off with  a towel.  If you have a weaker limb or limbs, it is easier to dress the weaker limb first. It is easier to undress your strong limb first. Wear clothes that are easy to put on and take off. For  example, use clothes and shoes with velcro instead of small buttons, clasps or  laces. 3 Avoid using scented products such as hair products and lotions.  These can irritate your lungs and cause shortness of breath for you  and others around you. Many people are allergic to scents. These  types of products are not allowed in the hospital. Be cautious when bathing. Use warm, not hot water. This helps eliminate  shortness of breath from a buildup of steam and condensation. Use the bathroom equipment suggested by your Occupational  Therapist. For example using a bath bench, bath stool, grab bars or a  raised toilet seat can make bathing and toileting easier and safer.  Shopping Bring a prepared list of things you need to buy.  Organize your shopping list by aisle or section of the  store.  Transport items in a buggy or shopping cart rather than  carrying them in a basket.  Load and carry grocery bags that are only half full or shop with  someone who can help pack and carry bags.  Avoid going out during rush hour when stores and streets are crowded. Consider using a delivery service. Housework Divide activities and do them throughout the week. Balance light with  heavy tasks.  Make one side of the bed at a time. Sit to change pillow cases and unfold linen. Avoid spray cleaners that may irritate your lungs. Clean the bathtub by sitting or kneeling. Clean one whole room at a time instead of going back and forth  between rooms to do each job.  Consider asking for help from family members or  hiring a cleaning service or housekeeper.  After washing dishes, allow them to air dry. Have work in front of you rather than at your side. Slide rather than lift objects.  Use long handled dustpans and cleaning sponges to decrease the  need for bending.  4 Make a weekly plan for major jobs such as laundry, cleaning and  changing sheets on beds. Do one job each day.  Keep a trash can in every  room to avoid too much walking. Buy more than one of each item you use around the house.  For example, keep sink cleaner in the bathroom and kitchen.  Keep a vacuum on each level of your home.  Cooking Cook and bake in steps to reduce energy use. Gather all ingredients and utensils before starting. Plan ahead with meal preparation. Make large meals and freeze in servings for later use. Use lightweight cookware and dishes to conserve energy.  Use paper plates and cups to eliminate dishwashing.  Use electric appliances such as can openers, blenders, food  processors and dishwasher to conserve energy.  Consider buying easy to prepare  or frozen meals, or using a meal  delivery service. Key Points: Prioritize activities of the day. Do heavier tasks when you have more energy.  Plan your days' and weeks' activities. Set up your work area so you do not  have to move around a lot looking for items to complete the task. Plan rest  times.  Pace yourself. Do not try to complete the whole task in one session. Break it  into smaller, easy to do steps. A good guide to follow is to take 10 minutes  each hour to rest. Do not rush. Position and Posture are important. Sit to work when you can to use 25%  less energy. Sit and stand as upright as you can. Practice deep breathing  exercises while you work to maintain your breathing rate and stay relaxed. Use assistive devices when recommended to save energy and make it more comfortable and easy taking care of yourself. Remember. The most important energy conservation tip is to listen to your body. Stop and rest BEFORE you get tired. Plan rest times. Rest often.  09/15/23  OT and patient brainstormed why tasks such as dressing and retrieving items from the floor are difficult: Back pain, fatigue, weakness, decreased balance. Patient states retrieving items from the floor, dressing and getting in and out of car can be taxing at times.   OT introduced: car  cane, reacher, sock aide, button hook, and dressing stick.  OT educated patient on use, and patient demonstrated competence with use of each item.       COMPLETED: Patient Stated       Patient will apply sleep hygiene techniques to her daily routine for improved sleep. Reports she is sleeping well now that new CPAP machine has been acquired.  No longer need to address this goal.        Post Clinical Reasoning: Client Action (Goal) Two Interventions: Energy conservation continued - pateint is applying 4 P's to her daily tasks.  OT and patient discussed reasons for decreased energy and OT introduced adaptive equipment that might be beneficial to decrease strain during ADLs, including a car cane, reacher, sock aide,dressing stick, and button hook. Did Client Try?: Yes Targeted Problem Area Status: A Lot Better Clinician View Of Client Situation:: Ms. Bloomquist continue to do extremely well.  Some of the adaptive equipment that I orginally thought she would benefit from, such as a leg lifter is no longer needed as she has made such improvements.  She states she has been using 4 Ps and is very excited about adaptive equipment provided today:  reacher, sock aide, button hook, car cane, dressing stick. Client View Of His/Her Situation:: Reports utilization of 4 P's.  She states she was sick for a bit, and had stopped doing exercises and plans to begin again this weekend.  Is excited to begin using the adaptive equipment introduced today so that she has more energy for tasks that she enjoys. Next Visit Plan:: Follow up on AE use, await modifications.  Once modifications complete OT will complete future visits addressing bathroom safety and home mobility.  Heather HILARIO Elbe, MHA, OT/L [phone]

## 2023-09-17 ENCOUNTER — Emergency Department

## 2023-09-17 ENCOUNTER — Encounter: Payer: Self-pay | Admitting: Intensive Care

## 2023-09-17 ENCOUNTER — Other Ambulatory Visit: Payer: Self-pay

## 2023-09-17 ENCOUNTER — Emergency Department
Admission: EM | Admit: 2023-09-17 | Discharge: 2023-09-17 | Disposition: A | Discharge status: Home / Self Care | Attending: Emergency Medicine | Admitting: Emergency Medicine

## 2023-09-17 DIAGNOSIS — R109 Unspecified abdominal pain: Secondary | ICD-10-CM | POA: Diagnosis not present

## 2023-09-17 DIAGNOSIS — R112 Nausea with vomiting, unspecified: Secondary | ICD-10-CM | POA: Diagnosis not present

## 2023-09-17 DIAGNOSIS — I10 Essential (primary) hypertension: Secondary | ICD-10-CM | POA: Insufficient documentation

## 2023-09-17 DIAGNOSIS — K449 Diaphragmatic hernia without obstruction or gangrene: Secondary | ICD-10-CM | POA: Diagnosis not present

## 2023-09-17 DIAGNOSIS — K4020 Bilateral inguinal hernia, without obstruction or gangrene, not specified as recurrent: Secondary | ICD-10-CM | POA: Diagnosis not present

## 2023-09-17 DIAGNOSIS — R1031 Right lower quadrant pain: Secondary | ICD-10-CM | POA: Diagnosis not present

## 2023-09-17 DIAGNOSIS — R1013 Epigastric pain: Secondary | ICD-10-CM | POA: Diagnosis not present

## 2023-09-17 DIAGNOSIS — R197 Diarrhea, unspecified: Secondary | ICD-10-CM | POA: Diagnosis present

## 2023-09-17 DIAGNOSIS — R110 Nausea: Secondary | ICD-10-CM | POA: Diagnosis not present

## 2023-09-17 DIAGNOSIS — R195 Other fecal abnormalities: Secondary | ICD-10-CM

## 2023-09-17 DIAGNOSIS — K859 Acute pancreatitis without necrosis or infection, unspecified: Secondary | ICD-10-CM | POA: Diagnosis not present

## 2023-09-17 LAB — CBC
HCT: 44.3 % (ref 36.0–46.0)
Hemoglobin: 15 g/dL (ref 12.0–15.0)
MCH: 31.7 pg (ref 26.0–34.0)
MCHC: 33.9 g/dL (ref 30.0–36.0)
MCV: 93.7 fL (ref 80.0–100.0)
Platelets: 301 K/uL (ref 150–400)
RBC: 4.73 MIL/uL (ref 3.87–5.11)
RDW: 12.9 % (ref 11.5–15.5)
WBC: 13.2 K/uL — ABNORMAL HIGH (ref 4.0–10.5)
nRBC: 0 % (ref 0.0–0.2)

## 2023-09-17 LAB — GASTROINTESTINAL PANEL BY PCR, STOOL (REPLACES STOOL CULTURE)

## 2023-09-17 LAB — LIPASE, BLOOD: Lipase: 34 U/L (ref 11–51)

## 2023-09-17 LAB — LACTIC ACID, PLASMA: Lactic Acid, Venous: 0.9 mmol/L (ref 0.5–1.9)

## 2023-09-17 LAB — COMPREHENSIVE METABOLIC PANEL WITH GFR
ALT: 13 U/L (ref 0–44)
AST: 16 U/L (ref 15–41)
Albumin: 3.3 g/dL — ABNORMAL LOW (ref 3.5–5.0)
Alkaline Phosphatase: 51 U/L (ref 38–126)
Anion gap: 9 (ref 5–15)
BUN: 22 mg/dL (ref 8–23)
CO2: 22 mmol/L (ref 22–32)
Calcium: 8.9 mg/dL (ref 8.9–10.3)
Chloride: 105 mmol/L (ref 98–111)
Creatinine, Ser: 0.82 mg/dL (ref 0.44–1.00)
GFR, Estimated: >60 mL/min (ref >60)
Glucose, Bld: 97 mg/dL (ref 70–99)
Potassium: 4 mmol/L (ref 3.5–5.1)
Sodium: 136 mmol/L (ref 135–145)
Total Bilirubin: 0.6 mg/dL (ref 0.0–1.2)
Total Protein: 6.2 g/dL — ABNORMAL LOW (ref 6.5–8.1)

## 2023-09-17 LAB — C DIFFICILE QUICK SCREEN W PCR REFLEX
C Diff antigen: NEGATIVE
C Diff interpretation: NOT DETECTED
C Diff toxin: NEGATIVE

## 2023-09-17 MED ORDER — ONDANSETRON HCL 4 MG/2ML IJ SOLN
4.0000 mg | INTRAMUSCULAR | Status: AC
  Administered 2023-09-17: 4 mg via INTRAVENOUS
  Filled 2023-09-17: qty 2

## 2023-09-17 MED ORDER — MORPHINE SULFATE (PF) 4 MG/ML IV SOLN
4.0000 mg | Freq: Once | INTRAVENOUS | Status: AC
  Administered 2023-09-17: 4 mg via INTRAVENOUS
  Filled 2023-09-17: qty 1

## 2023-09-17 MED ORDER — IOHEXOL 300 MG/ML  SOLN
100.0000 mL | Freq: Once | INTRAMUSCULAR | Status: AC | PRN
  Administered 2023-09-17: 100 mL via INTRAVENOUS

## 2023-09-17 MED ORDER — SODIUM CHLORIDE 0.9 % IV BOLUS
1000.0000 mL | Freq: Once | INTRAVENOUS | Status: AC
  Administered 2023-09-17: 1000 mL via INTRAVENOUS

## 2023-09-17 MED ORDER — IOHEXOL 350 MG/ML SOLN: 100.0000 mL | Freq: Once | INTRAVENOUS | Status: DC | PRN

## 2023-09-17 MED ORDER — ONDANSETRON 4 MG PO TBDP: 4.0000 mg | ORAL_TABLET | Freq: Three times a day (TID) | ORAL | 0 refills | Status: DC | PRN

## 2023-09-17 MED ORDER — DICYCLOMINE HCL 10 MG PO CAPS: 10.0000 mg | ORAL_CAPSULE | Freq: Three times a day (TID) | ORAL | 0 refills | Status: DC | PRN

## 2023-09-17 NOTE — Consult Note (Signed)
 Date of Consultation:  09/17/2023  Requesting Physician:  Oneil Budge, MD  Reason for Consultation:  Abdominal pain.  History of Present Illness: JASLEEN RIEPE is a 73 y.o. female presenting with 10 days of abdominal pain associated with diarrhea, nausea, and vomiting.  She reports that prior to this she was feeling well without any issues.  Denies any sick contacts, eating any raw or undercooked or expired food, or any recent exposures.  She reports that she has been having daily multiple bouts of diarrhea.  Initially she was also having nausea and vomiting but now is more having nausea only in addition to the diarrhea.  She did try taking Imodium which helped for a little bit but after stopping the Imodium she continued having again diarrhea.  Denies any fevers or chills.  She reports that sometimes with the pain in the abdomen it is harder to take a deep breath.  However no specific breathing difficulty itself.  The pain in her abdomen is diffuse with different areas specified particularly lower abdomen, mid and epigastric abdomen, as well as the right abdomen.  The patient presented to the emergency room today and her workup showed a white blood cell count of 13.2 she had a CT scan of the abdomen pelvis which showed a normal appendix, normal gallbladder, normal stomach and small bowel, and colon without any specific wall thickening but fluid-filled indicating possible diarrheal illness as well as some small amount of free fluid in the lower abdomen and right lower quadrant.  Past Medical History: Past Medical History:  Diagnosis Date   Allergy    Anxiety    Arthritis    SPINE   Asthma    Blood transfusion without reported diagnosis    Fatty liver    Fatty pancreas    First degree heart block    Frequency of urination    GERD (gastroesophageal reflux disease)    Hemorrhoids    Hepatic steatosis    Hiatal hernia    POST RESIDUAL SMALL HH PER IMAGING   History of acute pancreatitis  03/16/2015   History of adenomatous polyp of colon    tubular adenoma's 04-08-2014/   hyperplastic bening polyp 2000   History of concussion 2012   no residual   History of kidney stones    History of transient ischemic attack (TIA) 12/22/2011   no residual   History of vaginal dysplasia 2016   Hypertension    Pancreatic cyst    Pseudocyst of pancreas    PSVT (paroxysmal supraventricular tachycardia) (HCC)    cardioloigst-  dr ladona--- last visit 2013 per pt and is currently followed by pcp   Rectal bleeding    S/P AV (atrioventricular) nodal ablation 01-21-2010    dr fernande   Simple renal cyst    bilateral per imaging   Urgency of urination    Urinary frequency    Urinary leakage      Past Surgical History: Past Surgical History:  Procedure Laterality Date   BREAST EXCISIONAL BIOPSY Right    CARDIAC ELECTROPHYSIOLOGY MAPPING AND ABLATION  01-21-2010   dr fernande   ablation AVNRT   FOOT NEUROMA SURGERY Left 1996   HEMORRHOID SURGERY N/A 10/28/2016   Procedure: HEMORRHOIDECTOMY AND HEMORRHOIDAL PEXY;  Surgeon: Debby Hila, MD;  Location: [facility];  Service: General;  Laterality: N/A;   LAPAROSCOPY TAKEDOWN AND REPAIR HIATAL HERNIA /  NISSEN FUNDOPLATION  09-01-2005    dr gladis  [facility]   LEFT HEART CATH  AND CORONARY ANGIOGRAPHY N/A 01/17/2020   Procedure: LEFT HEART CATH AND CORONARY ANGIOGRAPHY;  Surgeon: Fernand Denyse LABOR, MD;  Location: [facility] INVASIVE CV LAB;  Service: Cardiovascular;  Laterality: N/A;   NASAL SEPTUM SURGERY  1991   SPINAL FUSION     TEE WITHOUT CARDIOVERSION  02/15/2012   Procedure: TRANSESOPHAGEAL ECHOCARDIOGRAM (TEE);  Surgeon: Erick JONELLE Bergamo, MD;  Location: [facility] ENDOSCOPY;  Service: Cardiovascular;  Laterality: N/A;  normal LV, normal EF, mild MR, trace TR, trace PI   TRANSTHORACIC ECHOCARDIOGRAM  12-22-2011    dr bergamo   normal echo   VAGINAL HYSTERECTOMY  1996   w/  BSO    Home Medications: Prior to Admission medications    Medication Sig Start Date End Date Taking? Authorizing Provider  dicyclomine  (BENTYL ) 10 MG capsule Take 1 capsule (10 mg total) by mouth every 8 (eight) hours as needed (abdominal pain, cramping). 09/17/23  Yes Jacolyn Pae, MD  ondansetron  (ZOFRAN -ODT) 4 MG disintegrating tablet Take 1 tablet (4 mg total) by mouth every 8 (eight) hours as needed. 09/17/23  Yes Jacolyn Pae, MD  albuterol  (PROVENTIL ) (2.5 MG/3ML) 0.083% nebulizer solution Take 3 mLs (2.5 mg total) by nebulization every 4 (four) hours as needed for wheezing or shortness of breath. 01/02/22 07/27/23  Cyrena Mylar, MD  albuterol  (VENTOLIN  HFA) 108 [phone] Base) MCG/ACT inhaler Inhale 2 puffs into the lungs every 6 (six) hours as needed for wheezing or shortness of breath. 01/02/22   Cyrena Mylar, MD  ALPRAZolam  (XANAX ) 0.5 MG tablet Take 0.5 mg by mouth daily as needed for anxiety. 06/25/20   [provider]  budesonide -formoterol  (SYMBICORT ) 160-4.5 MCG/ACT inhaler Inhale 2 puffs into the lungs 2 (two) times daily. 07/21/23   Hunsucker, Donnice JONELLE, MD  Clobetasol  Prop Emollient Base (CLOBETASOL  PROPIONATE E) 0.05 % emollient cream Apply 1 Application topically 3 (three) times daily. Tid x 4 weeks, bid x 4 weeks, daily x 4 weeks, then 2x/week 06/28/23   Fredirick Glenys RAMAN, MD  fluticasone  (FLONASE ) 50 MCG/ACT nasal spray Place 2 sprays into both nostrils daily. Patient not taking: Reported on 08/02/2023    [provider]  furosemide  (LASIX ) 20 MG tablet Take 1 tablet (20 mg total) by mouth daily as needed for edema or fluid. 05/25/23   Bergamo Heinz, MD  losartan  (COZAAR ) 25 MG tablet Take 1 tablet (25 mg total) by mouth every morning. 07/27/23   Bergamo Heinz, MD  methocarbamol  (ROBAXIN ) 500 MG tablet Take 500 mg by mouth every 6 (six) hours as needed for muscle spasms.    [provider]  metoprolol  succinate (TOPROL  XL) 25 MG 24 hr tablet Take 1 tablet (25 mg total) by mouth daily. 08/08/23   Bergamo Heinz, MD  mirabegron  ER  (MYRBETRIQ ) 50 MG TB24 tablet Take 50 mg by mouth daily.    [provider]  nitroGLYCERIN  (NITROSTAT ) 0.4 MG SL tablet DISSOLVE 1 TABLET UNDER THE TONGUE EVERY 5 MINUTES AS NEEDED FOR CHEST PAIN. DO NOT EXCEED A TOTAL OF 3 DOSES IN 15 MINUTES. CALL 911 IF NO RELIEF 03/30/22   Fernand Denyse LABOR, MD  NON FORMULARY Pt uses a cpap nightly    [provider]  pantoprazole  (PROTONIX ) 40 MG tablet Take 40 mg by mouth daily. 04/02/20   [provider]  rosuvastatin  (CRESTOR ) 20 MG tablet Take 1 tablet (20 mg total) by mouth every morning. 07/27/23   Bergamo Heinz, MD  spironolactone  (ALDACTONE ) 25 MG tablet Take 1 tablet (25 mg total) by mouth  every morning. 07/27/23   Ladona Heinz, MD  verapamil  (CALAN -SR) 120 MG CR tablet Take 1 tablet (120 mg total) by mouth at bedtime. 07/27/23   Ladona Heinz, MD  WEGOVY 1.7 MG/0.75ML SOAJ Inject 1.7 mg into the skin.    [provider]    Allergies: Allergies  Allergen Reactions   Hydrocodone  Nausea And Vomiting    Nausea and vomiting    Social History:  reports that she has never smoked. She has never used smokeless tobacco. She reports that she does not drink alcohol  and does not use drugs.   Family History: Family History  Problem Relation Age of Onset   Heart disease Mother 48       Heart failure >> death at age 54   Dementia Mother    Hypertension Mother    Breast cancer Mother 64   Colon cancer Mother    Heart disease Father 17       MI   COPD Father    Heart disease Brother    Mental illness Brother    Lung cancer Brother 52   Bone cancer Brother    Lung cancer Brother    Bone cancer Brother    Colon cancer Brother    Breast cancer Maternal Aunt    Breast cancer Cousin    Esophageal cancer Neg Hx    Stomach cancer Neg Hx    Rectal cancer Neg Hx     Review of Systems: Review of Systems  Constitutional:  Negative for chills and fever.  HENT:  Negative for hearing loss.   Respiratory:  Negative for shortness of  breath.   Cardiovascular:  Negative for chest pain.  Gastrointestinal:  Positive for abdominal pain, diarrhea, nausea and vomiting. Negative for blood in stool.  Genitourinary:  Negative for dysuria.  Musculoskeletal:  Negative for myalgias.  Skin:  Negative for rash.  Neurological:  Negative for dizziness.  Psychiatric/Behavioral:  Negative for depression.     Physical Exam BP (!) 147/75   Pulse 69   Temp 97.7 F (36.5 C) (Oral)   Resp 16   Ht 5' 1 (1.549 m)   Wt 72.1 kg   SpO2 100%   BMI 30.04 kg/m  CONSTITUTIONAL: No acute distress HEENT:  Normocephalic, atraumatic, extraocular motion intact. NECK: Trachea is midline, and there is no jugular venous distension. RESPIRATORY: Normal respiratory effort without pathologic use of accessory muscles. CARDIOVASCULAR: Regular rhythm and rate  GI: The abdomen is soft, nondistended, with mild diffuse tenderness particular in the lower abdomen, central abdomen, epigastric area, and the whole right side.  No specific peritonitis.  MUSCULOSKELETAL:  Normal muscle strength and tone in all four extremities.  No peripheral edema or cyanosis. SKIN: Skin turgor is normal. There are no pathologic skin lesions.  NEUROLOGIC:  Motor and sensation is grossly normal.  Cranial nerves are grossly intact. PSYCH:  Alert and oriented to person, place and time. Affect is normal.  Laboratory Analysis: Results for orders placed or performed during the hospital encounter of 09/17/23 (from the past 24 hours)  CBC     Status: Abnormal   Collection Time: 09/17/23 12:23 PM  Result Value Ref Range   WBC 13.2 (H) 4.0 - 10.5 K/uL   RBC 4.73 3.87 - 5.11 MIL/uL   Hemoglobin 15.0 12.0 - 15.0 g/dL   HCT 55.6 63.9 - 53.9 %   MCV 93.7 80.0 - 100.0 fL   MCH 31.7 26.0 - 34.0 pg   MCHC 33.9 30.0 - 36.0  g/dL   RDW 87.0 88.4 - 84.4 %   Platelets 301 150 - 400 K/uL   nRBC 0.0 0.0 - 0.2 %  Comprehensive metabolic panel     Status: Abnormal   Collection Time: 09/17/23  12:23 PM  Result Value Ref Range   Sodium 136 135 - 145 mmol/L   Potassium 4.0 3.5 - 5.1 mmol/L   Chloride 105 98 - 111 mmol/L   CO2 22 22 - 32 mmol/L   Glucose, Bld 97 70 - 99 mg/dL   BUN 22 8 - 23 mg/dL   Creatinine, Ser 9.17 0.44 - 1.00 mg/dL   Calcium  8.9 8.9 - 10.3 mg/dL   Total Protein 6.2 (L) 6.5 - 8.1 g/dL   Albumin 3.3 (L) 3.5 - 5.0 g/dL   AST 16 15 - 41 U/L   ALT 13 0 - 44 U/L   Alkaline Phosphatase 51 38 - 126 U/L   Total Bilirubin 0.6 0.0 - 1.2 mg/dL   GFR, Estimated >39 >39 mL/min   Anion gap 9 5 - 15  Lipase, blood     Status: None   Collection Time: 09/17/23 12:23 PM  Result Value Ref Range   Lipase 34 11 - 51 U/L  Gastrointestinal Panel by PCR , Stool     Status: None   Collection Time: 09/17/23 12:23 PM   Specimen: Stool  Result Value Ref Range   Campylobacter species NOT DETECTED NOT DETECTED   Plesimonas shigelloides NOT DETECTED NOT DETECTED   Salmonella species NOT DETECTED NOT DETECTED   Yersinia enterocolitica NOT DETECTED NOT DETECTED   Vibrio species NOT DETECTED NOT DETECTED   Vibrio cholerae NOT DETECTED NOT DETECTED   Enteroaggregative E coli (EAEC) NOT DETECTED NOT DETECTED   Enteropathogenic E coli (EPEC) NOT DETECTED NOT DETECTED   Enterotoxigenic E coli (ETEC) NOT DETECTED NOT DETECTED   Shiga like toxin producing E coli (STEC) NOT DETECTED NOT DETECTED   Shigella/Enteroinvasive E coli (EIEC) NOT DETECTED NOT DETECTED   Cryptosporidium NOT DETECTED NOT DETECTED   Cyclospora cayetanensis NOT DETECTED NOT DETECTED   Entamoeba histolytica NOT DETECTED NOT DETECTED   Giardia lamblia NOT DETECTED NOT DETECTED   Adenovirus F40/41 NOT DETECTED NOT DETECTED   Astrovirus NOT DETECTED NOT DETECTED   Norovirus GI/GII NOT DETECTED NOT DETECTED   Rotavirus A NOT DETECTED NOT DETECTED   Sapovirus (I, II, IV, and V) NOT DETECTED NOT DETECTED  C Difficile Quick Screen w PCR reflex     Status: None   Collection Time: 09/17/23 12:23 PM   Specimen: Stool   Result Value Ref Range   C Diff antigen NEGATIVE NEGATIVE   C Diff toxin NEGATIVE NEGATIVE   C Diff interpretation No C. difficile detected.   Lactic acid, plasma     Status: None   Collection Time: 09/17/23 12:23 PM  Result Value Ref Range   Lactic Acid, Venous 0.9 0.5 - 1.9 mmol/L    Imaging: CT ABDOMEN PELVIS W CONTRAST Result Date: 09/17/2023 CLINICAL DATA:  Epigastric pain. Loose stools for 1 week. History of pancreatitis. EXAM: CT ABDOMEN AND PELVIS WITH CONTRAST TECHNIQUE: Multidetector CT imaging of the abdomen and pelvis was performed using the standard protocol following bolus administration of intravenous contrast. RADIATION DOSE REDUCTION: This exam was performed according to the departmental dose-optimization program which includes automated exposure control, adjustment of the mA and/or kV according to patient size and/or use of iterative reconstruction technique. CONTRAST:  OMNIPAQUE  IOHEXOL  300 MG/ML  SOLN COMPARISON:  08/11/2020 FINDINGS: Lower chest: Clear lung bases. Mild cardiomegaly, without pericardial effusion. Tiny hiatal hernia. Hepatobiliary: Normal liver. Normal gallbladder, without biliary ductal dilatation. Pancreas: Fatty replaced pancreas, primarily head and uncinate process. Macrolobulated pancreatic head/uncinate process cystic lesion versus 2 adjacent lesions. The largest lesion/component measures 1.4 cm on 36/2 and is similar to the prior exam (when remeasured). 12 mm on 02/03/2019. No main duct dilatation or acute inflammation. Spleen: Normal in size, without focal abnormality. Adrenals/Urinary Tract: Normal adrenal glands. Bilateral too small to characterize renal lesions are most likely cysts and do not warrant specific imaging follow-up. Normal urinary bladder. No hydronephrosis. Stomach/Bowel: Portions of the proximal stomach and gastric antrum are underdistended. Fluid-filled colon, suggesting a diarrheal state. Normal terminal ileum. Normal appendix,  including on 48/2. Normal small bowel. Vascular/Lymphatic: Aortic atherosclerosis. Circumaortic left renal vein. No abdominopelvic adenopathy. Reproductive: Normal uterus and adnexa. Other: Small bilateral fat containing inguinal hernias. Small volume fluid within the pelvis including on 71/2 is new since the prior exam, eccentric right. There is also small volume fluid in the inferior right pericolic gutter on 53/2. This is adjacent to the posterior aspect of the appendix, which is normal. No free intraperitoneal air. Musculoskeletal: L5-S1 trans pedicle screw fixation. IMPRESSION: 1. Development of small volume pelvic and inferior right pericolic gutter fluid, without localizing/cause identified. 2. Fluid throughout the colon, suggesting a diarrheal state. 3. Normal appendix. 4. Incidental findings, including: Aortic Atherosclerosis (ICD10-I70.0). Tiny hiatal hernia. 5. Cystic pancreatic head/uncinate process lesion or lesions. Relatively similar compared to 09/06/2020 but enlarged since 2021. Most likely pseudocysts. Of doubtful clinical significance, given ongoing stability. If imaging surveillance is desired, pre and post contrast abdominal MRI at 1-2 years should be considered. Electronically Signed   By: Rockey Kilts M.D.   On: 09/17/2023 14:14    Assessment and Plan: This is a 72 y.o. female with abdominal pain, nausea, vomiting, and diarrhea.  - Discussed with patient the findings on her CT scan and laboratory studies.  Her stool studies have been negative.  Her lactic acid is normal.  Discussed with her that currently on imaging there is no surgical indication.  Her pannus does not look inflamed, her gallbladder is not inflamed, no evidence of any fat stranding surrounding the bowels whether small intestine or large intestine, no free air.  There is only some small amount of free fluid which could be related to diarrheal illness since last visit she has been having over the last 10 days.  Discussed  with her that if she were to have any specific organ inflammation like up her appendix or gallbladder or 10 days course, we would be seeing significant changes with the structures on her CT scan.  However there is no such thing on imaging today.  Discussed with her that unfortunately not able to give her the specific diagnosis but most likely this is a type of gastroenteritis that she is taking a bigger toll on her.  Recommended that she stay well-hydrated and at least include electrolytes with her water intake, can also start probiotics to help with some of the diarrhea as well as fiber supplementation to help absorb some of the fluid, as well as a bland diet.   Aloysius Sheree Plant, MD [facility] Pg:  [phone]

## 2023-09-17 NOTE — ED Provider Notes (Signed)
 [facility] Provider Note    Event Date/Time   First MD Initiated Contact with Patient 09/17/23 1144     (approximate)   History   Diarrhea and Emesis   HPI  Pamela Daniel is a 73 y.o. female with a history of somewhat remote pancreatitis with a cyst, she also tells me she has a small diaphragmatic hernia, and history of hypertension.   For the last 10 days has been having loose watery stool.  It improved the 2 preceding days as she took Imodium and it seemed to go away but it has returned again today.  She has had about 4 loose watery nonbloody stools.  Accompanied by nausea and a sense of pain in her epigastrium.  She relates it feels similar reminds her of when she had pancreatitis.  She has had pain in this area for several days now.  Still eating but less.  Still drinking but less.  There are some accompanying nausea that is been intermittent for the last week.  No fever.  No pain or burning with urination no rash.  She is not traveled anywhere.  She has not been on any recent antibiotic.  She does not have any known history of C. difficile.  Prior colonoscopy demonstrated polyps and diverticulosis was performed about a year ago [reviewed record from GI]  She does not use any alcohol .  Physical Exam   Triage Vital Signs: ED Triage Vitals  Encounter Vitals Group     BP 09/17/23 1137 (!) 149/81     Girls Systolic BP Percentile --      Girls Diastolic BP Percentile --      Boys Systolic BP Percentile --      Boys Diastolic BP Percentile --      Pulse Rate 09/17/23 1136 74     Resp 09/17/23 1136 16     Temp 09/17/23 1136 97.7 F (36.5 C)     Temp Source 09/17/23 1136 Oral     SpO2 09/17/23 1136 100 %     Weight 09/17/23 1136 159 lb (72.1 kg)     Height 09/17/23 1136 5' 1 (1.549 m)     Head Circumference --      Peak Flow --      Pain Score 09/17/23 1136 5     Pain Loc --      Pain Education --      Exclude from Growth Chart --      Most recent vital signs: Vitals:   09/17/23 1137 09/17/23 1230  BP: (!) 149/81 (!) 147/75  Pulse:  69  Resp:    Temp:    SpO2:  100%     General: Awake, no distress.  Pleasant.  Daughter at bedside.  Mucous membranes are moist. CV:  Good peripheral perfusion.  Normal tones and rate Resp:  Normal effort.  Clear bilateral Abd:  No distention.  Soft nontender nondistended throughout except she reports focal discomfort moderate pain in the epigastrium without rebound or guarding.  There is no masses.  Old lower midline surgical scar Other:  Warm well-perfused extremities.  She does not appear in acute distress.   ED Results / Procedures / Treatments   Labs (all labs ordered are listed, but only abnormal results are displayed) Labs Reviewed  CBC - Abnormal; Notable for the following components:      Result Value   WBC 13.2 (*)    All other components within normal limits  COMPREHENSIVE METABOLIC  PANEL WITH GFR - Abnormal; Notable for the following components:   Total Protein 6.2 (*)    Albumin 3.3 (*)    All other components within normal limits  GASTROINTESTINAL PANEL BY PCR, STOOL (REPLACES STOOL CULTURE)  C DIFFICILE QUICK SCREEN W PCR REFLEX    LIPASE, BLOOD  LACTIC ACID, PLASMA    RADIOLOGY  CT abdomen pelvis interpreted by me is grossly positive for free fluid in the pelvis  CT ABDOMEN PELVIS W CONTRAST Result Date: 09/17/2023 CLINICAL DATA:  Epigastric pain. Loose stools for 1 week. History of pancreatitis. EXAM: CT ABDOMEN AND PELVIS WITH CONTRAST TECHNIQUE: Multidetector CT imaging of the abdomen and pelvis was performed using the standard protocol following bolus administration of intravenous contrast. RADIATION DOSE REDUCTION: This exam was performed according to the departmental dose-optimization program which includes automated exposure control, adjustment of the mA and/or kV according to patient size and/or use of iterative reconstruction technique. CONTRAST:   OMNIPAQUE  IOHEXOL  300 MG/ML  SOLN COMPARISON:  08/11/2020 FINDINGS: Lower chest: Clear lung bases. Mild cardiomegaly, without pericardial effusion. Tiny hiatal hernia. Hepatobiliary: Normal liver. Normal gallbladder, without biliary ductal dilatation. Pancreas: Fatty replaced pancreas, primarily head and uncinate process. Macrolobulated pancreatic head/uncinate process cystic lesion versus 2 adjacent lesions. The largest lesion/component measures 1.4 cm on 36/2 and is similar to the prior exam (when remeasured). 12 mm on 02/03/2019. No main duct dilatation or acute inflammation. Spleen: Normal in size, without focal abnormality. Adrenals/Urinary Tract: Normal adrenal glands. Bilateral too small to characterize renal lesions are most likely cysts and do not warrant specific imaging follow-up. Normal urinary bladder. No hydronephrosis. Stomach/Bowel: Portions of the proximal stomach and gastric antrum are underdistended. Fluid-filled colon, suggesting a diarrheal state. Normal terminal ileum. Normal appendix, including on 48/2. Normal small bowel. Vascular/Lymphatic: Aortic atherosclerosis. Circumaortic left renal vein. No abdominopelvic adenopathy. Reproductive: Normal uterus and adnexa. Other: Small bilateral fat containing inguinal hernias. Small volume fluid within the pelvis including on 71/2 is new since the prior exam, eccentric right. There is also small volume fluid in the inferior right pericolic gutter on 53/2. This is adjacent to the posterior aspect of the appendix, which is normal. No free intraperitoneal air. Musculoskeletal: L5-S1 trans pedicle screw fixation. IMPRESSION: 1. Development of small volume pelvic and inferior right pericolic gutter fluid, without localizing/cause identified. 2. Fluid throughout the colon, suggesting a diarrheal state. 3. Normal appendix. 4. Incidental findings, including: Aortic Atherosclerosis (ICD10-I70.0). Tiny hiatal hernia. 5. Cystic pancreatic head/uncinate  process lesion or lesions. Relatively similar compared to 09/06/2020 but enlarged since 2021. Most likely pseudocysts. Of doubtful clinical significance, given ongoing stability. If imaging surveillance is desired, pre and post contrast abdominal MRI at 1-2 years should be considered. Electronically Signed   By: Rockey Kilts M.D.   On: 09/17/2023 14:14      PROCEDURES:  Critical Care performed: No  Procedures   MEDICATIONS ORDERED IN ED: Medications  sodium chloride  0.9 % bolus 1,000 mL (1,000 mLs Intravenous New Bag/Given 09/17/23 1226)  ondansetron  (ZOFRAN ) injection 4 mg (4 mg Intravenous Given 09/17/23 1227)  morphine  (PF) 4 MG/ML injection 4 mg (4 mg Intravenous Given 09/17/23 1227)  iohexol  (OMNIPAQUE ) 300 MG/ML solution 100 mL (100 mLs Intravenous Contrast Given 09/17/23 1318)     IMPRESSION / MDM / ASSESSMENT AND PLAN / ED COURSE  I reviewed the triage vital signs and the nursing notes.  Differential diagnosis includes but is not limited to, abdominal perforation, aortic dissection, cholecystitis, appendicitis, diverticulitis, colitis, esophagitis/gastritis, kidney stone, pyelonephritis, urinary tract infection, aortic aneurysm. All are considered in decision and treatment plan. Based upon the patient's presentation and risk factors, it seems that she has increased in transit and loose watery stool.  Given her history considerations such as pancreatic cause, insufficiency would be considered also given her pain epigastrium recurrent pancreatitis, colitis, diverticulitis, self-limited viral or other enteral/colitis type infection etc. would be considered.  She does not have any severe or excruciating pain that would be suggestive of this being an obvious ischemic colitis and she denies any bloody stooling.    No acute cardiopulmonary symptoms.  No fever.  Will obtain labs, CT imaging hydrate and provide pain and antiemetic relief.   Patient's  presentation is most consistent with acute complicated illness / injury requiring diagnostic workup.      Clinical Course as of 09/17/23 1536  Sat Sep 17, 2023  1328 Comprehensive metabolic panel without concerning departures.  Mild hypoalbuminemia but LFTs lipase and metabolic electrolytes are normal  Her CBC shows a leukocytosis, in a setting of her symptoms raises suspicion for infection.  Lactate is normal arguing against vascular cause or severe metabolic derangement/sepsis [MQ]  1329 Awaiting CT imaging [MQ]  1440 Today moderate amount of fluid, free in the pelvis.  This was leukocytosis concerning.  Stool panel does not reveal an obvious viral or pathologic cause for enteritis.  Given her leukocytosis, symptomatology, abdominal pain on examination and CT imaging with free fluid I have called Dr. Desiderio to review the case.  He is currently in the OR, OR rounder advises he will review once done with operating room. [MQ]  1448 Patient reports morphine  has provided significant relief and nausea improved.  On reexamination she continues to have pain epigastrium but also has moderate pain and some focality of discomfort along the right lower quadrant as well.  Consultation request has been placed with Dr. Desiderio, await bedside consult.  I suspect that if the patient's symptoms remain well-controlled and general surgery examined the patient and feels that she would not require further observation or inpatient treatment then she may be a candidate for discharge.  However, if ongoing or recurrent pain, any concerns from general surgery or need for further observation she would require admission [MQ]    Clinical Course User Index [MQ] Dicky Anes, MD   Ongoing care assigned to Dr. Jacolyn.  Plan as noted above, awaiting bedside consult by general surgery and further disposition decision based upon assessment of surgery, patient reassessment.  FINAL CLINICAL IMPRESSION(S) / ED DIAGNOSES   Final  diagnoses:  Loose stools  Abdominal pain, unspecified abdominal location     Rx / DC Orders   ED Discharge Orders     None        Note:  This document was prepared using Dragon voice recognition software and may include unintentional dictation errors.   Dicky Anes, MD 09/17/23 1537

## 2023-09-17 NOTE — ED Provider Notes (Signed)
-----------------------------------------   4:59 PM on 09/17/2023 -----------------------------------------  I took over care on this patient from Dr. Dicky.  I consulted and discussed the case with Dr. Desiderio from general surgery, who came to evaluate the patient in person.  He advises that there is no indication for surgical intervention.  On reassessment the patient states she is feeling better.  She appears well.  There is no indication for further ED workup or inpatient admission.  Dr. Desiderio recommended a high-fiber diet and potentially probiotics.  I have prescribed some nausea medication as well as Bentyl  for symptom control.  I gave strict return precautions, and the patient expressed understanding.   Jacolyn Pae, MD 09/17/23 1700

## 2023-09-17 NOTE — ED Triage Notes (Signed)
 Patient has been having abdominal pain, nausea, emesis, and diarrhea for 10 days.  History pancreatitis

## 2023-09-17 NOTE — ED Notes (Signed)
 Pt ambulatory to bathroom w steady gate. Returned to bed without incident

## 2023-09-17 NOTE — Discharge Instructions (Addendum)
  Please return to the emergency room right away if you are to develop a fever, severe nausea, your pain becomes severe or worsens, you are unable to keep food down, begin vomiting any dark or bloody fluid, you develop any dark or bloody stools, feel dehydrated, or other new concerns or symptoms arise.  As discussed with the surgeon, you should start a high fiber diet and can also consider a probiotic (like a lactobacillus tablet, available at the pharmacy) to help with your symptoms.

## 2023-09-22 DIAGNOSIS — R1084 Generalized abdominal pain: Secondary | ICD-10-CM | POA: Diagnosis not present

## 2023-09-22 DIAGNOSIS — R7989 Other specified abnormal findings of blood chemistry: Secondary | ICD-10-CM | POA: Diagnosis not present

## 2023-09-22 DIAGNOSIS — I10 Essential (primary) hypertension: Secondary | ICD-10-CM | POA: Diagnosis not present

## 2023-09-22 DIAGNOSIS — K862 Cyst of pancreas: Secondary | ICD-10-CM | POA: Diagnosis not present

## 2023-09-22 DIAGNOSIS — K760 Fatty (change of) liver, not elsewhere classified: Secondary | ICD-10-CM | POA: Diagnosis not present

## 2023-09-22 DIAGNOSIS — K5900 Constipation, unspecified: Secondary | ICD-10-CM | POA: Diagnosis not present

## 2023-09-22 DIAGNOSIS — E669 Obesity, unspecified: Secondary | ICD-10-CM | POA: Diagnosis not present

## 2023-09-22 DIAGNOSIS — R112 Nausea with vomiting, unspecified: Secondary | ICD-10-CM | POA: Diagnosis not present

## 2023-10-07 DIAGNOSIS — M5442 Lumbago with sciatica, left side: Secondary | ICD-10-CM | POA: Diagnosis not present

## 2023-10-07 DIAGNOSIS — M4306 Spondylolysis, lumbar region: Secondary | ICD-10-CM | POA: Diagnosis not present

## 2023-10-07 DIAGNOSIS — M79652 Pain in left thigh: Secondary | ICD-10-CM | POA: Diagnosis not present

## 2023-10-07 DIAGNOSIS — E669 Obesity, unspecified: Secondary | ICD-10-CM | POA: Diagnosis not present

## 2023-10-07 DIAGNOSIS — M62838 Other muscle spasm: Secondary | ICD-10-CM | POA: Diagnosis not present

## 2023-10-07 DIAGNOSIS — I10 Essential (primary) hypertension: Secondary | ICD-10-CM | POA: Diagnosis not present

## 2023-10-13 ENCOUNTER — Encounter: Payer: Self-pay | Admitting: *Deleted

## 2023-10-13 ENCOUNTER — Telehealth: Payer: Self-pay

## 2023-10-13 ENCOUNTER — Other Ambulatory Visit: Payer: Self-pay | Admitting: *Deleted

## 2023-10-13 NOTE — Patient Instructions (Signed)
 Visit Information  Thank you for taking time to visit with me today. Please don't hesitate to contact me if I can be of assistance to you before our next scheduled home appointment.  Following are the goals we discussed today:   Goals Addressed               This Visit's Progress     [name]        08/02/23  Assessment: Pamela Daniel reports having chronic sob and dizziness. She has close follow up with her PCP, pulmonologist, and cardiologist. She reports having 3 falls this year. She uses a rollator as needed. States she has AFIB and COPD. Wears CPAP at night and as needed. Has PFT scheduled in October. Recent medication adjustments made. Reports carrying her phone with her at all times. Does not have a medical alert system. However, her daughter checks on her frequently and takes Pamela Daniel to appointments. Pamela Daniel still drives but states she does not when she is not feeling well. Endorses taking frequent rest breaks  and naps due to sob or dizziness.   Intervention: Provided booklet with chronic conditions and discussed in detail when to call MD or 911 regarding symptoms related to COPD, CHF, AFIB. Encouraged Pamela Daniel to take medications consistently and as prescribed. Discussed fall precautions. Discussed referral for [facility] CCM services. Pamela Daniel states she would rather wait on [facility]BCI referral being made at this time. States it's so much going on. States we can discuss referral at later time.  Plan: Scheduled next follow up visit for Thursday, August 21st 10am. Will discuss [facility]BCI CCM referral for complex care management needs closer to end of AG RN home visits.  CLIENT/RN ACTION PLAN - FALL PREVENTION  Registered Nurse:  Pablo Hurst Date: 08/02/23  Client Name: Pamela Daniel Client ID:    Target Area:  FALL PREVENTION    Why Problem May Occur: When dizzy    Target Goal: No falls over the next 160 days   STRATEGIES Coping Strategies Ideas  Change Positions  Slowly: lying to sitting, sitting to standing Reviewed 08/02/23 Reviewed 09/08/23 Reviewed 10/13/23 Changing positions can make people lightheaded. When getting up in the morning sit for a few minutes, before standing.  Stand for a few minutes before walking and hold on to sturdy furniture or countertop.    Drink water Reviewed 08/02/23 Reviewed 09/08/23 Reviewed 10/13/23  Dehydration can make people dizzy. Ask your healthcare provider how much water you can drink. Decrease caffeine, caffeine makes you urinate a lot, which can make you dehydrated.   If you fall, tell someone Reviewed 08/02/23 Reviewed 09/08/23 Reviewed 10/13/23 Tell a friend or family member even if you didn't get hurt. Tell your healthcare provider if you fall.  They can help you figure out why.   Get your vision and hearing checked Reviewed 08/02/23 Reviewed 09/08/23 Reviewed 10/13/23 Poor vision and hearing loss can make people fall. Glaucoma and diabetes can cause poor vision.   Other    Prevention Ideas  Review your Medicines Reviewed 08/02/23 Reviewed 09/08/23 Reviewed 10/13/23 Many medicines can make you dizzy or sleepy and increase your risk of falling. Your Aging Gracefully Nurse will look at your medications and see if you are taking any that might cause falling. Take medications as prescribed  Activity and Exercise Reviewed 08/02/23 Reviewed 09/08/23 Reviewed 10/13/23 Aging Gracefully exercises Walking (inside or outside) [employer]:  cooking, Education officer, environmental, laundry Exercise while watching TV Swimming or water aerobics.  Strengthen Bones Reviewed 08/02/23 Reviewed 09/08/23 Reviewed 10/13/23 Exercise makes your bones stronger. Vitamin D  and Calcium  make your bones stronger.  Ask your Healthcare provider if you should be taking them. Your body makes vitamin D  from the sun.  Sit in the sun for 10 minutes every day (without sunscreen).   Control your urine  Reviewed 08/02/23 Reviewed  09/08/23 Reviewed 10/13/23 Prevent constipation Cut back on caffeinated drinks. Don't wait to urinate. If diabetic, control your blood sugar. Ask your Aging Gracefully Nurse and Healthcare Provider  about urine control.   Control your blood sugar (if you are diabetic) High blood sugar can cause frequent urination, poor vision, and numbness in your feet, which can make you fall.   Low blood sugar can cause confusion and dizziness.   Other coping strategies 1. 2. 3. 4. 5.   PRACTICE It is important to practice the strategies so we can determine if they will be effective in helping to reach the goal.    Follow these specific recommendations:        If strategy does not work the first time, try it again.     We may make some changes over the next few sessions.       Pablo Hurst, MSN, RN, BSN [facility], Healthy Communities RN Case Manager for Aging Gracefully Direct Dial: [phone]      09/08/23  Assessment: Pamela Daniel reports doing great overall. States she looks and feels like a new person since her cardiologist adjusted her medications. Reports recent results of her long term cardiac monitoring for 14 days did not show atrial fib but brief episodes of SVT. States she is being treated with medications and no further procedures are required at this time. Ms, Daniel expresses gratitude and appreciation for her great team of doctors. States wearing her CPAP nightly also helps her feel better. Denies any dizziness, fatigue, SOB, fatigue, or falls. Reports pain 0 out of 10. Reports having left shoulder injection on yesterday. States she is willing to engage with [facility]I CCM team towards the end of AG RN visits. States she has multiple upcoming doctors' appointments. Therefore, she does not want to add anything else to her plate. Also states she does not answer unknown calls. If she does not answer the phone, she prefers text messages instead of voicemail  messages.  Interventions: Demonstrated  Aging Gracefully home exercises. Pamela Daniel actively engaged for return demonstration. Provided Aging Gracefully booklet and encouraged practice of exercises multiple times a day.  Discussed fall precautions. Celebrated with Ms. Pohlman for feeling and doing so much better. Discussed it was a night and day difference from last AG RN home visit vs today's visit. Ms. Folino'. Provided bag of food as courtesy of [employer].  Plan: Scheduled next home AG visit for 10/13/23  Pablo Hurst, MSN, RN, BSN [place]  [facility], Healthy Communities RN Case Manager for Aging Gracefully Direct Dial: [phone]      10/13/23  Assessment: Mrs. Yazdani reports feeling better from a bout of nausea, vomiting, and diarrhea that prompted her to go to ED last month. States her energy is coming back slowly by surely. States she has also had issues with back pain lately. Reports current pain 2 out of 10. States she took muscle relaxer prior to visit which helped. States she has slowed down considerably due to recent health issues. Denies SOB or dizziness at the time of visit. States she no  longer has dizziness since Cardiologist changed medications. Reports upcoming appointments with Uro/Gyn and Pulmonologist. Agreeable to [facility]BCI CCM referral upon last AG RN home visit next month.   Interventions: Discussed fall precautions. Encouraged AG home exercises. Discussed slow movement during position changes. Encouraged frequent rest breaks when doing house work.  Plan: Scheduled next AG RN home visit for 11/10/23 at 10am.   Pablo Hurst, MSN, RN, BSN [place]  [facility], Healthy Communities RN Case Manager for Aging Gracefully Direct Dial: [phone]                                                                             Our next appointment is on Oct. 23rd   at 1030 am.  If you are experiencing a Mental Health or Behavioral Health Crisis or need someone to talk to, please call the Suicide and Crisis Lifeline: 988 call the [place]  National Suicide Prevention Lifeline: (514)085-9201 or TTY: 252-844-7044 TTY 4093402135) to talk to a trained counselor call 1-800-273-TALK (toll free, 24 hour hotline) go to [facility] [address] [place] [phone]) call 911   The patient verbalized understanding of instructions, educational materials, and care plan provided today and agreed to receive a mailed copy of patient instructions, educational materials, and care plan.   Pablo Hurst, MSN, RN, BSN [place]  [facility], Healthy Communities RN Case Manager for Aging Gracefully Direct Dial: [phone]

## 2023-10-13 NOTE — Patient Outreach (Signed)
 Aging Gracefully Program  RN Visit  10/13/2023  RAHMA MELLER March 02, 1950 [id number]  Visit:  RN Visit Number: 3- Third Visit  RN TIME CALCULATION: Start TIme:  RN Start Time Calculation: 1030 End Time:  RN Stop Time Calculation: 1105 Total Minutes:  RN Time Calculation: 35  Readiness To Change Score:  Readiness to Change Score: 10  Universal RN Interventions: Calendar Distribution: No Exercise Review: Yes Medications: Yes Medication Changes: No Mood: Yes Pain: Yes PCP Advocacy/Support: No Fall Prevention: Yes Incontinence: Yes Clinician View Of Client Situation: Arrived for home visit. Mrs. Holzheimer was sitting on porch waiting for writer. Home visit conducted on front porch. Open areas noted on wooden porch. Mrs; Matson ambulates independently without assistive device. Client View Of His/Her Situation: Mrs. Freundlich reports going to ED last month for stomach issues. States she is feeling better. However, states she is slowly getting her strength back up.  Healthcare Provider Communication: Did Surveyor, mining With [employer] Provider?: No Healthcare Provider Response According to RN: N/A According to Client, Did PCP Report Communication With An Aging Gracefully RN?: No Healthcare Provider Response According To Client: N/A  Clinician View of Client Situation: Clinician View Of Client Situation: Arrived for home visit. Mrs. Erby was sitting on porch waiting for writer. Home visit conducted on front porch. Open areas noted on wooden porch. Mrs; Burgo ambulates independently without assistive device. Client's View of His/Her Situation: Client View Of His/Her Situation: Mrs. Carie reports going to ED last month for stomach issues. States she is feeling better. However, states she is slowly getting her strength back up.  Medication Assessment: Reviewed    OT Update: Pending [facility] contracts and assessments.  Session Summary: Mrs. Pillay is slowly by surely feeling  better since being sick last month. Recently took in a stray kitten that she enjoys. Mrs. Sargent diligent and engaged about her health.   Goals Addressed               This Visit's Progress     [name]        08/02/23  Assessment: Ms. Schriever reports having chronic sob and dizziness. She has close follow up with her PCP, pulmonologist, and cardiologist. She reports having 3 falls this year. She uses a rollator as needed. States she has AFIB and COPD. Wears CPAP at night and as needed. Has PFT scheduled in October. Recent medication adjustments made. Reports carrying her phone with her at all times. Does not have a medical alert system. However, her daughter checks on her frequently and takes Ms. Erck to appointments. Ms. Snavely still drives but states she does not when she is not feeling well. Endorses taking frequent rest breaks  and naps due to sob or dizziness.   Intervention: Provided booklet with chronic conditions and discussed in detail when to call MD or 911 regarding symptoms related to COPD, CHF, AFIB. Encouraged Ms. Boyland to take medications consistently and as prescribed. Discussed fall precautions. Discussed referral for [facility] CCM services. Ms. Curtice states she would rather wait on [facility]I referral being made at this time. States it's so much going on. States we can discuss referral at later time.  Plan: Scheduled next follow up visit for Thursday, August 21st 10am. Will discuss [facility] CCM referral for complex care management needs closer to end of AG RN home visits.  CLIENT/RN ACTION PLAN - FALL PREVENTION  Registered Nurse:  Pablo Hurst Date: 08/02/23  Client Name: [name] Client ID:    Target  Area:  FALL PREVENTION    Why Problem May Occur: When dizzy    Target Goal: No falls over the next 160 days   STRATEGIES Coping Strategies Ideas  Change Positions Slowly: lying to sitting, sitting to standing Reviewed 08/02/23 Reviewed 09/08/23 Reviewed  10/13/23 Changing positions can make people lightheaded. When getting up in the morning sit for a few minutes, before standing.  Stand for a few minutes before walking and hold on to sturdy furniture or countertop.    Drink water Reviewed 08/02/23 Reviewed 09/08/23 Reviewed 10/13/23  Dehydration can make people dizzy. Ask your healthcare provider how much water you can drink. Decrease caffeine, caffeine makes you urinate a lot, which can make you dehydrated.   If you fall, tell someone Reviewed 08/02/23 Reviewed 09/08/23 Reviewed 10/13/23 Tell a friend or family member even if you didn't get hurt. Tell your healthcare provider if you fall.  They can help you figure out why.   Get your vision and hearing checked Reviewed 08/02/23 Reviewed 09/08/23 Reviewed 10/13/23 Poor vision and hearing loss can make people fall. Glaucoma and diabetes can cause poor vision.   Other    Prevention Ideas  Review your Medicines Reviewed 08/02/23 Reviewed 09/08/23 Reviewed 10/13/23 Many medicines can make you dizzy or sleepy and increase your risk of falling. Your Aging Gracefully Nurse will look at your medications and see if you are taking any that might cause falling. Take medications as prescribed  Activity and Exercise Reviewed 08/02/23 Reviewed 09/08/23 Reviewed 10/13/23 Aging Gracefully exercises Walking (inside or outside) [employer]:  cooking, Education officer, environmental, laundry Exercise while watching TV Swimming or water aerobics.   Strengthen Bones Reviewed 08/02/23 Reviewed 09/08/23 Reviewed 10/13/23 Exercise makes your bones stronger. Vitamin D  and Calcium  make your bones stronger.  Ask your Healthcare provider if you should be taking them. Your body makes vitamin D  from the sun.  Sit in the sun for 10 minutes every day (without sunscreen).   Control your urine  Reviewed 08/02/23 Reviewed 09/08/23 Reviewed 10/13/23 Prevent constipation Cut back on caffeinated drinks. Don't wait to  urinate. If diabetic, control your blood sugar. Ask your Aging Gracefully Nurse and Healthcare Provider  about urine control.   Control your blood sugar (if you are diabetic) High blood sugar can cause frequent urination, poor vision, and numbness in your feet, which can make you fall.   Low blood sugar can cause confusion and dizziness.   Other coping strategies 1. 2. 3. 4. 5.   PRACTICE It is important to practice the strategies so we can determine if they will be effective in helping to reach the goal.    Follow these specific recommendations:        If strategy does not work the first time, try it again.     We may make some changes over the next few sessions.       Pablo Hurst, MSN, RN, BSN [facility], Healthy Communities RN Case Manager for Aging Gracefully Direct Dial: [phone]      09/08/23  Assessment: Ms. Venuti reports doing great overall. States she looks and feels like a new person since her cardiologist adjusted her medications. Reports recent results of her long term cardiac monitoring for 14 days did not show atrial fib but brief episodes of SVT. States she is being treated with medications and no further procedures are required at this time. Ms, Napp expresses gratitude and appreciation for her great team of doctors. States wearing her  CPAP nightly also helps her feel better. Denies any dizziness, fatigue, SOB, fatigue, or falls. Reports pain 0 out of 10. Reports having left shoulder injection on yesterday. States she is willing to engage with [facility]I CCM team towards the end of AG RN visits. States she has multiple upcoming doctors' appointments. Therefore, she does not want to add anything else to her plate. Also states she does not answer unknown calls. If she does not answer the phone, she prefers text messages instead of voicemail messages.  Interventions: Demonstrated  Aging Gracefully home exercises. Ms. Sandoval actively  engaged for return demonstration. Provided Aging Gracefully booklet and encouraged practice of exercises multiple times a day.  Discussed fall precautions. Celebrated with Ms. Blandino for feeling and doing so much better. Discussed it was a night and day difference from last AG RN home visit vs today's visit. Ms. Fedele'. Provided bag of food as courtesy of [employer].  Plan: Scheduled next home AG visit for 10/13/23  Pablo Hurst, MSN, RN, BSN [place]  [facility], Healthy Communities RN Case Manager for Aging Gracefully Direct Dial: [phone]      10/13/23  Assessment: Mrs. Desrocher reports feeling better from a bout of nausea, vomiting, and diarrhea that prompted her to go to ED last month. States her energy is coming back slowly by surely. States she has also had issues with back pain lately. Reports current pain 2 out of 10. States she took muscle relaxer prior to visit which helped. States she has slowed down considerably due to recent health issues. Denies SOB or dizziness at the time of visit. States she no longer has dizziness since Cardiologist changed medications. Reports upcoming appointments with Uro/Gyn and Pulmonologist. Agreeable to VBCI CCM referral upon last AG RN home visit next month.   Interventions: Discussed fall precautions. Encouraged AG home exercises. Discussed slow movement during position changes. Encouraged frequent rest breaks when doing house work.  Plan: Scheduled next AG RN home visit for 11/10/23 at 10am.   Pablo Hurst, MSN, RN, BSN [place]  [facility], Healthy Communities RN Case Manager for Aging Gracefully Direct Dial: [phone]                                                                           Pablo Hurst, MSN, RN, BSN [place]  [facility], Healthy Communities RN Case Manager for Aging Gracefully Direct  Dial: [phone]

## 2023-10-13 NOTE — Telephone Encounter (Signed)
 Vertell with [facility] called asking for back and patient needing medical clearance.   Originally I couldn't find any information on her being a patient here, I had to go back further than 05/2022 and that appt was canceled.  Called back and spoke with Reggie and informed him that she would need an appt if anything since its been over a year since she's been seen.

## 2023-10-18 ENCOUNTER — Encounter: Payer: Self-pay | Admitting: Obstetrics and Gynecology

## 2023-10-18 ENCOUNTER — Ambulatory Visit (INDEPENDENT_AMBULATORY_CARE_PROVIDER_SITE_OTHER): Admitting: Obstetrics and Gynecology

## 2023-10-18 ENCOUNTER — Other Ambulatory Visit (HOSPITAL_COMMUNITY)
Admission: RE | Admit: 2023-10-18 | Discharge: 2023-10-18 | Disposition: A | Discharge status: Home / Self Care | Source: Other Acute Inpatient Hospital | Attending: Obstetrics and Gynecology | Admitting: Obstetrics and Gynecology

## 2023-10-18 VITALS — BP 119/77 | HR 71 | Ht 60.0 in | Wt 157.0 lb

## 2023-10-18 DIAGNOSIS — R82998 Other abnormal findings in urine: Secondary | ICD-10-CM | POA: Diagnosis not present

## 2023-10-18 DIAGNOSIS — N993 Prolapse of vaginal vault after hysterectomy: Secondary | ICD-10-CM

## 2023-10-18 DIAGNOSIS — R350 Frequency of micturition: Secondary | ICD-10-CM

## 2023-10-18 DIAGNOSIS — R319 Hematuria, unspecified: Secondary | ICD-10-CM

## 2023-10-18 DIAGNOSIS — N904 Leukoplakia of vulva: Secondary | ICD-10-CM | POA: Diagnosis not present

## 2023-10-18 DIAGNOSIS — N3281 Overactive bladder: Secondary | ICD-10-CM

## 2023-10-18 DIAGNOSIS — N952 Postmenopausal atrophic vaginitis: Secondary | ICD-10-CM | POA: Diagnosis not present

## 2023-10-18 DIAGNOSIS — N393 Stress incontinence (female) (male): Secondary | ICD-10-CM | POA: Diagnosis not present

## 2023-10-18 LAB — POCT URINALYSIS DIP (CLINITEK)
Bilirubin, UA: NEGATIVE
Glucose, UA: NEGATIVE mg/dL
Ketones, POC UA: NEGATIVE mg/dL
Nitrite, UA: POSITIVE — AB
POC PROTEIN,UA: NEGATIVE
Spec Grav, UA: 1.02 (ref 1.010–1.025)
Urobilinogen, UA: 0.2 U/dL
pH, UA: 6 (ref 5.0–8.0)

## 2023-10-18 LAB — URINALYSIS, ROUTINE W REFLEX MICROSCOPIC
Bilirubin Urine: NEGATIVE
Glucose, UA: NEGATIVE mg/dL
Hgb urine dipstick: NEGATIVE
Ketones, ur: NEGATIVE mg/dL
Nitrite: POSITIVE — AB
Protein, ur: NEGATIVE mg/dL
Specific Gravity, Urine: 1.015 (ref 1.005–1.030)
WBC, UA: >50 WBC/hpf (ref 0–5)
pH: 6 (ref 5.0–8.0)

## 2023-10-18 MED ORDER — VIBEGRON 75 MG PO TABS: 75.0000 mg | ORAL_TABLET | Freq: Every day | ORAL | 5 refills | Status: AC

## 2023-10-18 MED ORDER — ESTRADIOL 0.1 MG/GM VA CREA: 0.5000 g | TOPICAL_CREAM | VAGINAL | 11 refills | Status: AC

## 2023-10-18 MED ORDER — SULFAMETHOXAZOLE-TRIMETHOPRIM 800-160 MG PO TABS: 1.0000 | ORAL_TABLET | Freq: Two times a day (BID) | ORAL | 0 refills | Status: AC
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
## 2023-10-18 NOTE — Patient Instructions (Addendum)
 Please restart estrogen cream and use this nightly for the vaginal atrophy.  If you want to have intercourse, using a silicon blend lubrication would be better so the water is not absorbed.   Use the clobetasol  twice a week. In between for a barrier support you can use A&D ointment over the counter to keep the area from rubbing.  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   Stop Myrbetriq  and start Gemtesa 75mg  daily.   It appears you have a UTI. I am sending in antibiotics to treat this. Bactrim  has been sent to pharmacy to treat this. Your urine has been sent for culture.

## 2023-10-19 ENCOUNTER — Ambulatory Visit: Attending: Obstetrics and Gynecology | Admitting: Physical Therapy

## 2023-10-19 ENCOUNTER — Other Ambulatory Visit: Payer: Self-pay

## 2023-10-19 ENCOUNTER — Encounter: Payer: Self-pay | Admitting: Physical Therapy

## 2023-10-19 DIAGNOSIS — M6281 Muscle weakness (generalized): Secondary | ICD-10-CM | POA: Insufficient documentation

## 2023-10-19 DIAGNOSIS — R279 Unspecified lack of coordination: Secondary | ICD-10-CM | POA: Diagnosis not present

## 2023-10-19 DIAGNOSIS — R293 Abnormal posture: Secondary | ICD-10-CM | POA: Insufficient documentation

## 2023-10-19 DIAGNOSIS — M62838 Other muscle spasm: Secondary | ICD-10-CM | POA: Diagnosis not present
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
## 2023-10-20 LAB — URINE CULTURE: Culture: >=100000 — AB

## 2023-10-21 ENCOUNTER — Ambulatory Visit: Payer: Self-pay | Admitting: Obstetrics and Gynecology

## 2023-10-25 ENCOUNTER — Ambulatory Visit (INDEPENDENT_AMBULATORY_CARE_PROVIDER_SITE_OTHER): Admitting: *Deleted

## 2023-10-25 ENCOUNTER — Ambulatory Visit: Admitting: Pulmonary Disease

## 2023-10-25 ENCOUNTER — Encounter: Payer: Self-pay | Admitting: Pulmonary Disease

## 2023-10-25 VITALS — BP 123/73 | HR 93 | Temp 98.7°F | Ht 61.0 in | Wt 157.0 lb

## 2023-10-25 DIAGNOSIS — J45909 Unspecified asthma, uncomplicated: Secondary | ICD-10-CM | POA: Diagnosis not present

## 2023-10-25 DIAGNOSIS — R0609 Other forms of dyspnea: Secondary | ICD-10-CM | POA: Diagnosis not present

## 2023-10-25 DIAGNOSIS — J4540 Moderate persistent asthma, uncomplicated: Secondary | ICD-10-CM

## 2023-10-25 LAB — PULMONARY FUNCTION TEST
DL/VA % pred: 116 %
DL/VA: 4.92 ml/min/mmHg/L
DLCO cor % pred: 120 %
DLCO cor: 20.76 ml/min/mmHg
DLCO unc % pred: 120 %
DLCO unc: 20.76 ml/min/mmHg
FEF 25-75 Post: 2.46 L/s
FEF 25-75 Pre: 1.28 L/s
FEF2575-%Change-Post: 93 %
FEF2575-%Pred-Post: 160 %
FEF2575-%Pred-Pre: 82 %
FEV1-%Change-Post: 18 %
FEV1-%Pred-Post: 111 %
FEV1-%Pred-Pre: 94 %
FEV1-Post: 2.09 L
FEV1-Pre: 1.75 L
FEV1FVC-%Change-Post: 3 %
FEV1FVC-%Pred-Pre: 97 %
FEV6-%Change-Post: 16 %
FEV6-%Pred-Post: 115 %
FEV6-%Pred-Pre: 99 %
FEV6-Post: 2.74 L
FEV6-Pre: 2.36 L
FEV6FVC-%Change-Post: 1 %
FEV6FVC-%Pred-Post: 105 %
FEV6FVC-%Pred-Pre: 104 %
FVC-%Change-Post: 14 %
FVC-%Pred-Post: 110 %
FVC-%Pred-Pre: 95 %
FVC-Post: 2.74 L
FVC-Pre: 2.39 L
Post FEV1/FVC ratio: 76 %
Post FEV6/FVC ratio: 100 %
Pre FEV1/FVC ratio: 73 %
Pre FEV6/FVC Ratio: 99 %
RV % pred: 115 %
RV: 2.45 L
TLC % pred: 106 %
TLC: 4.9 L

## 2023-10-25 MED ORDER — ALBUTEROL SULFATE HFA 108 (90 BASE) MCG/ACT IN AERS: 2.0000 | INHALATION_SPRAY | Freq: Four times a day (QID) | RESPIRATORY_TRACT | 11 refills | Status: AC | PRN

## 2023-10-25 MED ORDER — ALBUTEROL SULFATE (2.5 MG/3ML) 0.083% IN NEBU: 2.5000 mg | INHALATION_SOLUTION | RESPIRATORY_TRACT | 3 refills | Status: AC | PRN

## 2023-10-25 NOTE — Progress Notes (Signed)
 Full PFT performed today.

## 2023-10-25 NOTE — Progress Notes (Signed)
 @Patient  ID: Pamela Daniel, female    DOB: Apr 29, 1950, 73 y.o.   MRN: [id number]  No chief complaint on file.   Referring provider: Larnell Hamilton, MD  HPI:   73 y.o. woman whom are seen for evaluation of dyspnea on exertion.  Most recent cardiology note reviewed.  Recent ED note reviewed.  Returns for routine follow-up.  At last visit high suspicion for asthma.  Placed on Symbicort  but are encouraged to take regularly.  With this her shortness of breath is markedly improved.  About 90% better.  She does find episodes with exertion where she gets very fatigued and short of breath and goes to use her CPAP again.  After a few minutes she feels much better.  We discussed using albuterol  before exercise.  Also discussed escalating to triple inhaled therapy in the future based on symptoms if still needing albuterol  frequently or using CPAP during the day.  We reviewed her PFTs performed today, these are normal with the exception of significant bronchodilator response both FEV1 and FVC consistent with asthma.  HPI initial visit: Chief complaint shortness of breath.  Less and less activity.  Less active.  Due to severe shortness of breath even on light surfaces short distances.  Around the house at [place].  No attempt everything to bear was.  No position make his bed worse.  No seasonal environmental factors she can notify him if he is better worse.  Albuterol  helps some.  Albeit short lived.  She tried Symbicort  in the past and nothing was super helpful.  Although she admits to not taking as prescribed.  Not as frequently.  She has history of asthma.  Longstanding exposure to smoke secondhand as well as cooking, with fire stones.  She does not smoke, never smoker.  PFTs were ordered but not scheduled.  We discussed helping to be more aggressive with her asthma regimen and see if this helps.  Encouraging albuterol  helps some I think we can make a further improvement.  Need PFTs for further  evaluation.   Questionaires / Pulmonary Flowsheets:   ACT:      No data to display          MMRC:     No data to display          Epworth:     06/02/2023    9:00 AM  Results of the Epworth flowsheet  Sitting and reading 3  Watching TV 3  Sitting, inactive in a public place (e.g. a theatre or a meeting) 2  As a passenger in a car for an hour without a break 3  Lying down to rest in the afternoon when circumstances permit 3  Sitting and talking to someone 1  Sitting quietly after a lunch without alcohol  3  In a car, while stopped for a few minutes in traffic 0  Total score 18    Tests:   FENO:  No results found for: NITRICOXIDE  PFT:    Latest Ref Rng & Units 10/25/2023   12:37 PM  PFT Results  FVC-Pre L 2.39  P  FVC-Predicted Pre % 95  P  FVC-Post L 2.74  P  FVC-Predicted Post % 110  P  Pre FEV1/FVC % % 73  P  Post FEV1/FCV % % 76  P  FEV1-Pre L 1.75  P  FEV1-Predicted Pre % 94  P  FEV1-Post L 2.09  P  DLCO uncorrected ml/min/mmHg 20.76  P  DLCO UNC% % 120  P  DLCO corrected ml/min/mmHg 20.76  P  DLCO COR %Predicted % 120  P  DLVA Predicted % 116  P  TLC L 4.90  P  TLC % Predicted % 106  P  RV % Predicted % 115  P    P Preliminary result  Personally reviewed and interpreted as normal spirometry, significant bronchodilator response in both FEV1 and FVC.  Lung volumes within normal notes.  DLCO is elevated.  WALK:      No data to display          Imaging: Personally reviewed and as per EMR discussion this note No results found.  Lab Results: Personally reviewed CBC    Component Value Date/Time   WBC 13.2 (H) 09/17/2023 1223   RBC 4.73 09/17/2023 1223   HGB 15.0 09/17/2023 1223   HGB 13.7 08/22/2020 1150   HCT 44.3 09/17/2023 1223   PLT 301 09/17/2023 1223   PLT 152 08/22/2020 1150   MCV 93.7 09/17/2023 1223   MCV 95.4 03/10/2012 1515   MCH 31.7 09/17/2023 1223   MCHC 33.9 09/17/2023 1223   RDW 12.9 09/17/2023 1223    LYMPHSABS 1.7 01/02/2022 1425   MONOABS 0.7 01/02/2022 1425   EOSABS 0.2 01/02/2022 1425   BASOSABS 0.0 01/02/2022 1425    BMET    Component Value Date/Time   NA 136 09/17/2023 1223   K 4.0 09/17/2023 1223   CL 105 09/17/2023 1223   CO2 22 09/17/2023 1223   GLUCOSE 97 09/17/2023 1223   BUN 22 09/17/2023 1223   CREATININE 0.82 09/17/2023 1223   CREATININE 0.70 08/22/2020 1150   CREATININE 0.72 03/15/2014 1233   CALCIUM  8.9 09/17/2023 1223   GFRNONAA >60 09/17/2023 1223   GFRNONAA >60 08/22/2020 1150   GFRNONAA 88 03/15/2014 1233   GFRAA >60 04/11/2019 0815   GFRAA >89 03/15/2014 1233    BNP    Component Value Date/Time   BNP 56.4 07/29/2020 1634    ProBNP    Component Value Date/Time   PROBNP 70.3 12/21/2011 2130    Specialty Problems       Pulmonary Problems   HIATAL HERNIA   Qualifier: Diagnosis of  By: Genie CMA (AAMA), Leisha   Replacing diagnoses that were inactivated after the 04/19/22 regulatory import      Cough    Allergies  Allergen Reactions   Hydrocodone  Nausea And Vomiting    Nausea and vomiting    Immunization History  Administered Date(s) Administered   Influenza-Unspecified 09/12/2014, 12/29/2016   Pneumococcal Conjugate-13 04/04/2014   Td 04/04/2014    Past Medical History:  Diagnosis Date   Allergy    Anxiety    Arthritis    SPINE   Asthma    Blood transfusion without reported diagnosis    Fatty liver    Fatty pancreas    First degree heart block    Frequency of urination    GERD (gastroesophageal reflux disease)    Hemorrhoids    Hepatic steatosis    Hiatal hernia    POST RESIDUAL SMALL HH PER IMAGING   History of acute pancreatitis 03/16/2015   History of adenomatous polyp of colon    tubular adenoma's 04-08-2014/   hyperplastic bening polyp 2000   History of concussion 2012   no residual   History of kidney stones    History of transient ischemic attack (TIA) 12/22/2011   no residual   History of vaginal  dysplasia 2016   Hypertension    Pancreatic cyst  Pseudocyst of pancreas    PSVT (paroxysmal supraventricular tachycardia)    cardioloigst-  dr ladona--- last visit 2013 per pt and is currently followed by pcp   Rectal bleeding    S/P AV (atrioventricular) nodal ablation 01-21-2010    dr fernande   Simple renal cyst    bilateral per imaging   Urgency of urination    Urinary frequency    Urinary leakage     Tobacco History: Social History   Tobacco Use  Smoking Status Never  Smokeless Tobacco Never   Counseling given: Not Answered   Continue to not smoke  Outpatient Encounter Medications as of 10/25/2023  Medication Sig   ALPRAZolam  (XANAX ) 0.5 MG tablet Take 0.5 mg by mouth daily as needed for anxiety.   budesonide -formoterol  (SYMBICORT ) 160-4.5 MCG/ACT inhaler Inhale 2 puffs into the lungs 2 (two) times daily.   clobetasol  cream (TEMOVATE ) 0.05 % Apply topically.   Clobetasol  Prop Emollient Base (CLOBETASOL  PROPIONATE E) 0.05 % emollient cream Apply 1 Application topically 3 (three) times daily. Tid x 4 weeks, bid x 4 weeks, daily x 4 weeks, then 2x/week   estradiol (ESTRACE) 0.1 MG/GM vaginal cream Place 0.5 g vaginally 2 (two) times a week. Place 0.5g nightly for two weeks then twice a week after   fluticasone  (FLONASE ) 50 MCG/ACT nasal spray Place 2 sprays into both nostrils daily.   furosemide  (LASIX ) 20 MG tablet Take 1 tablet (20 mg total) by mouth daily as needed for edema or fluid.   losartan  (COZAAR ) 25 MG tablet Take 1 tablet (25 mg total) by mouth every morning.   methocarbamol  (ROBAXIN ) 500 MG tablet Take 500 mg by mouth every 6 (six) hours as needed for muscle spasms.   metoprolol  succinate (TOPROL  XL) 25 MG 24 hr tablet Take 1 tablet (25 mg total) by mouth daily.   nitroGLYCERIN  (NITROSTAT ) 0.4 MG SL tablet DISSOLVE 1 TABLET UNDER THE TONGUE EVERY 5 MINUTES AS NEEDED FOR CHEST PAIN. DO NOT EXCEED A TOTAL OF 3 DOSES IN 15 MINUTES. CALL 911 IF NO RELIEF   NON  FORMULARY Pt uses a cpap nightly   pantoprazole  (PROTONIX ) 40 MG tablet Take 40 mg by mouth daily.   rosuvastatin  (CRESTOR ) 20 MG tablet Take 1 tablet (20 mg total) by mouth every morning.   spironolactone  (ALDACTONE ) 25 MG tablet Take 1 tablet (25 mg total) by mouth every morning.   verapamil  (CALAN -SR) 120 MG CR tablet Take 1 tablet (120 mg total) by mouth at bedtime. (Patient taking differently: Take 60 mg by mouth at bedtime.)   Vibegron 75 MG TABS Take 1 tablet (75 mg total) by mouth daily.   WEGOVY 1.7 MG/0.75ML SOAJ Inject 1.7 mg into the skin.   [DISCONTINUED] albuterol  (PROVENTIL ) (2.5 MG/3ML) 0.083% nebulizer solution Take 3 mLs (2.5 mg total) by nebulization every 4 (four) hours as needed for wheezing or shortness of breath.   [DISCONTINUED] albuterol  (VENTOLIN  HFA) 108 (90 Base) MCG/ACT inhaler Inhale 2 puffs into the lungs every 6 (six) hours as needed for wheezing or shortness of breath.   albuterol  (PROVENTIL ) (2.5 MG/3ML) 0.083% nebulizer solution Take 3 mLs (2.5 mg total) by nebulization every 4 (four) hours as needed for wheezing or shortness of breath.   albuterol  (VENTOLIN  HFA) 108 (90 Base) MCG/ACT inhaler Inhale 2 puffs into the lungs every 6 (six) hours as needed for wheezing or shortness of breath.   No facility-administered encounter medications on file as of 10/25/2023.     Review of Systems  Review of Systems  N/a Physical Exam  BP 123/73   Pulse 93   Temp 98.7 F (37.1 C) (Oral)   Ht 5' 1 (1.549 m)   Wt 157 lb (71.2 kg)   SpO2 93%   BMI 29.66 kg/m   Wt Readings from Last 5 Encounters:  10/25/23 157 lb (71.2 kg)  10/18/23 157 lb (71.2 kg)  09/17/23 159 lb (72.1 kg)  08/18/23 162 lb (73.5 kg)  07/27/23 162 lb 9.6 oz (73.8 kg)    BMI Readings from Last 5 Encounters:  10/25/23 29.66 kg/m  10/18/23 30.66 kg/m  09/17/23 30.04 kg/m  08/18/23 30.61 kg/m  07/27/23 30.72 kg/m     Physical Exam General: Sitting in chair, no acute distress Eyes:  EOMI, no icterus Neck: Supple, no JVP appreciated Pulmonary: Distant, clear, bit tight, mild dyspnea noted with speaking in multiple sentences Cardiovascular: Regular rate and rhythm Abdomen: Nondistended MSK: No synovitis, no joint effusion Neuro: Normal gait, no weakness Psych: Normal mood, full affect  Assessment & Plan:   Dyspnea on exertion: Suspect multifactorial related to history of asthma.  Also likely element of deconditioning.  PFTs consistent with asthma with bronchodilator response, otherwise normal.  Marked improvement since with the Symbicort .  Asthma: Suspect poorly controlled.  Improvement in symptoms with albuterol .  Marked improvement with regular Symbicort  use.  Encourage albuterol  prior to exercise.  If beneficial or symptoms worsen would consider escalation of triple inhaled therapy, addition of LAMA for additional bronchodilation especially with significant bronchodilator response on PFTs.  Albuterol  HFA and nebulizer solution refilled today.   Return in about 6 months (around 04/24/2024) for f/u Dr. Annella.   Donnice JONELLE Annella, MD 10/25/2023

## 2023-10-25 NOTE — Patient Instructions (Signed)
 Full PFT performed today.

## 2023-10-25 NOTE — Patient Instructions (Signed)
 I am glad you are doing well  Continue Symbicort  as you are  I refilled albuterol  inhaler and nebulizer  Try using the albuterol  inhaler 15 to 20 minutes before you go for walks or exert yourself to see if it helps you go farther and not need the CPAP machine to recover  Return to clinic in 6 months or sooner as needed with Dr. Annella

## 2023-10-27 ENCOUNTER — Ambulatory Visit: Admitting: Physical Therapy

## 2023-10-27 DIAGNOSIS — R293 Abnormal posture: Secondary | ICD-10-CM | POA: Diagnosis not present

## 2023-10-27 DIAGNOSIS — R279 Unspecified lack of coordination: Secondary | ICD-10-CM | POA: Diagnosis not present

## 2023-10-27 DIAGNOSIS — M6281 Muscle weakness (generalized): Secondary | ICD-10-CM

## 2023-10-27 DIAGNOSIS — M62838 Other muscle spasm: Secondary | ICD-10-CM | POA: Diagnosis not present
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
## 2023-10-27 NOTE — Patient Instructions (Addendum)
 Urge Incontinence  Ideal urination frequency is every 2-4 wakeful hours, which equates to 5-8 times within a 24-hour period.   Urge incontinence is leakage that occurs when the bladder muscle contracts, creating a sudden need to go before getting to the bathroom.   Going too often when your bladder isn't actually full can disrupt the body's automatic signals to store and hold urine longer, which will increase urgency/frequency.  In this case, the bladder "is running the show" and strategies can be learned to retrain this pattern.   One should be able to control the first urge to urinate, at around .  The bladder can hold up to a "grande latte," or . To help you gain control, practice the Urge Drill below when urgency strikes.  This drill will help retrain your bladder signals and allow you to store and hold urine longer.  The overall goal is to stretch out your time between voids to reach a more manageable voiding schedule.    Practice your quick flicks often throughout the day (each waking hour) even when you don't need feel the urge to go.  This will help strengthen your pelvic floor muscles, making them more effective in controlling leakage.  Urge Drill  When you feel an urge to go, follow these steps to regain control: Stop what you are doing and be still Take one deep breath, directing your air into your abdomen Think an affirming thought, such as "I've got this." Do 5 quick flicks of your pelvic floor Walk with control to the bathroom to void, or delay voiding   15-20 mins in the evenings based on tolerance.  Please clear any positions with doctor as needed.  Please stop any position if negative symptoms occur (dizziness/lightheadedness/fatigue/increased pain, etc.)

## 2023-11-10 ENCOUNTER — Other Ambulatory Visit: Payer: Self-pay | Admitting: *Deleted

## 2023-11-10 DIAGNOSIS — I10 Essential (primary) hypertension: Secondary | ICD-10-CM

## 2023-11-10 NOTE — Patient Outreach (Signed)
Aging Gracefully Program  RN Visit  11/10/2023  Pamela Daniel 03-12-1950 [id number]  Visit:  RN Visit Number: 4- Fourth Visit  RN TIME CALCULATION: Start TIme:  RN Start Time Calculation: 1000 End Time:  RN Stop Time Calculation: 1130 Total Minutes:  RN Time Calculation: 90  Readiness To Change Score:  Readiness to Change Score: 10  Universal RN Interventions: Calendar Distribution: No Exercise Review: Yes Medications: Yes Medication Changes: No Mood: Yes Pain: Yes PCP Advocacy/Support: No Fall Prevention: Yes Incontinence: Yes Clinician View Of Client Situation: Arrived for home visit. Pamela Daniel answered door. Ambulating independently without assisted device. Home neat.  Little small cat present during visit. Client View Of His/Her Situation: Pamela Daniel reports pain 5 out of 10. Denies any recent falls. Reports doing well overall.  Healthcare Provider Communication: Did Surveyor, mining With [employer] Provider?: No Healthcare Provider Response According to RN: n/a According to Client, Did PCP Report Communication With An Aging Gracefully RN?: No Healthcare Provider Response According To Client: n/a  Clinician View of Client Situation: Clinician View Of Client Situation: Arrived for home visit. Pamela Daniel answered door. Ambulating independently without assisted device. Home neat.  Little small cat present during visit. Client's View of His/Her Situation: Client View Of His/Her Situation: Pamela Daniel reports pain 5 out of 10. Denies any recent falls. Reports doing well overall.  Medication Assessment: Reviewed.    OT Update: Pending [facility] contracts and assessments.  Session Summary: Pamela Daniel is delightful and engaging. States she is feeling much better than she did several months ago. States she is getting active again. Denies weakness, sob, and dizziness.    Goals Addressed               This Visit's Progress     COMPLETED: [name]         08/02/23  Assessment: Pamela Daniel reports having chronic sob and dizziness. She has close follow up with her PCP, pulmonologist, and cardiologist. She reports having 3 falls this year. She uses a rollator as needed. States she has AFIB and COPD. Wears CPAP at night and as needed. Has PFT scheduled in October. Recent medication adjustments made. Reports carrying her phone with her at all times. Does not have a medical alert system. However, her daughter checks on her frequently and takes Pamela Daniel to appointments. Pamela Daniel still drives but states she does not when she is not feeling well. Endorses taking frequent rest breaks  and naps due to sob or dizziness.   Intervention: Provided booklet with chronic conditions and discussed in detail when to call MD or 911 regarding symptoms related to COPD, CHF, AFIB. Encouraged Pamela Daniel to take medications consistently and as prescribed. Discussed fall precautions. Discussed referral for VBCI CCM services. Pamela Daniel states she would rather wait on VBCI referral being made at this time. States it's so much going on. States we can discuss referral at later time.  Plan: Scheduled next follow up visit for Thursday, August 21st 10am. Will discuss VBCI CCM referral for complex care management needs closer to end of AG RN home visits.  CLIENT/RN ACTION PLAN - FALL PREVENTION  Registered Nurse:  Pablo Hurst Date: 08/02/23  Client Name: Pamela Daniel Client ID:    Target Area:  FALL PREVENTION    Why Problem May Occur: When dizzy    Target Goal: No falls over the next 160 days   STRATEGIES Coping Strategies Ideas  Change Positions Slowly: lying to sitting, sitting  to standing Reviewed 08/02/23 Reviewed 09/08/23 Reviewed 10/13/23 Reviewed 11/10/23 Changing positions can make people lightheaded. When getting up in the morning sit for a few minutes, before standing.  Stand for a few minutes before walking and hold on to sturdy furniture or countertop.     Drink water Reviewed 08/02/23 Reviewed 09/08/23 Reviewed 10/13/23 Reviewed 11/10/23  Dehydration can make people dizzy. Ask your healthcare provider how much water you can drink. Decrease caffeine, caffeine makes you urinate a lot, which can make you dehydrated.   If you fall, tell someone Reviewed 08/02/23 Reviewed 09/08/23 Reviewed 10/13/23 Reviewed 11/10/23 Tell a friend or family member even if you didn't get hurt. Tell your healthcare provider if you fall.  They can help you figure out why.   Get your vision and hearing checked Reviewed 08/02/23 Reviewed 09/08/23 Reviewed 10/13/23 Reviewed 11/10/23 Poor vision and hearing loss can make people fall. Glaucoma and diabetes can cause poor vision.   Other    Prevention Ideas  Review your Medicines Reviewed 08/02/23 Reviewed 09/08/23 Reviewed 10/13/23 Reviewed 11/10/23 Many medicines can make you dizzy or sleepy and increase your risk of falling. Your Aging Gracefully Nurse will look at your medications and see if you are taking any that might cause falling. Take medications as prescribed  Activity and Exercise Reviewed 08/02/23 Reviewed 09/08/23 Reviewed 10/13/23 Reviewed 11/10/23 Aging Gracefully exercises Walking (inside or outside) [employer]:  cooking, Education officer, environmental, laundry Exercise while watching TV Swimming or water aerobics.   Strengthen Bones Reviewed 08/02/23 Reviewed 09/08/23 Reviewed 10/13/23 Reviewed 11/10/23 Exercise makes your bones stronger. Vitamin D  and Calcium  make your bones stronger.  Ask your Healthcare provider if you should be taking them. Your body makes vitamin D  from the sun.  Sit in the sun for 10 minutes every day (without sunscreen).   Control your urine  Reviewed 08/02/23 Reviewed 09/08/23 Reviewed 10/13/23 Reviewed 11/10/23 Prevent constipation Cut back on caffeinated drinks. Don't wait to urinate. If diabetic, control your blood sugar. Ask your Aging Gracefully Nurse and  Healthcare Provider  about urine control.   Control your blood sugar (if you are diabetic) High blood sugar can cause frequent urination, poor vision, and numbness in your feet, which can make you fall.   Low blood sugar can cause confusion and dizziness.   Other coping strategies 1. 2. 3. 4. 5.   PRACTICE It is important to practice the strategies so we can determine if they will be effective in helping to reach the goal.    Follow these specific recommendations:        If strategy does not work the first time, try it again.     We may make some changes over the next few sessions.       Pablo Hurst, MSN, RN, BSN [facility], Healthy Communities RN Case Manager for Aging Gracefully Direct Dial: [phone]      09/08/23  Assessment: Ms. Fross reports doing great overall. States she looks and feels like a new person since her cardiologist adjusted her medications. Reports recent results of her long term cardiac monitoring for 14 days did not show atrial fib but brief episodes of SVT. States she is being treated with medications and no further procedures are required at this time. Ms, Twichell expresses gratitude and appreciation for her great team of doctors. States wearing her CPAP nightly also helps her feel better. Denies any dizziness, fatigue, SOB, fatigue, or falls. Reports pain 0 out of 10. Reports having  left shoulder injection on yesterday. States she is willing to engage with VBCI CCM team towards the end of AG RN visits. States she has multiple upcoming doctors' appointments. Therefore, she does not want to add anything else to her plate. Also states she does not answer unknown calls. If she does not answer the phone, she prefers text messages instead of voicemail messages.  Interventions: Demonstrated  Aging Gracefully home exercises. Ms. Carapia actively engaged for return demonstration. Provided Aging Gracefully booklet and encouraged  practice of exercises multiple times a day.  Discussed fall precautions. Celebrated with Ms. Guizar for feeling and doing so much better. Discussed it was a night and day difference from last AG RN home visit vs today's visit. Ms. Billiot'. Provided bag of food as courtesy of [employer] [facility] [employer].  Plan: Scheduled next home AG visit for 10/13/23  Pablo Hurst, MSN, RN, BSN [place]  [facility], Healthy Communities RN Case Manager for Aging Gracefully Direct Dial: [phone]      10/13/23  Assessment: Mrs. Schissler reports feeling better from a bout of nausea, vomiting, and diarrhea that prompted her to go to ED last month. States her energy is coming back slowly by surely. States she has also had issues with back pain lately. Reports current pain 2 out of 10. States she took muscle relaxer prior to visit which helped. States she has slowed down considerably due to recent health issues. Denies SOB or dizziness at the time of visit. States she no longer has dizziness since Cardiologist changed medications. Reports upcoming appointments with Uro/Gyn and Pulmonologist. Agreeable to VBCI CCM referral upon last AG RN home visit next month.   Interventions: Discussed fall precautions. Encouraged AG home exercises. Discussed slow movement during position changes. Encouraged frequent rest breaks when doing house work.  Plan: Scheduled next AG RN home visit for 11/10/23 at 10am.   Pablo Hurst, MSN, RN, BSN [place]  [facility], Healthy Communities RN Case Manager for Aging Gracefully Direct Dial: [phone]      11/10/23  Assessment: Ms. Medine is doing well overall. Denies falls and dizziness. Reports back pain 5 out of 10. Enjoys her new cat. States stomach issues have resolved. Reports going to outpatient therapy for vaginal prolapse. Also does exercises at home. States she has been making slower intentional movement with position changes.  Keeps rollator in care when out and about. States recommendations from pulmonologist have been helpful.   Interventions: Discussed VBCI CCM follow up. Ms. Mckinnie is agreeable. Discussed fall precautions. Discussed safety measures when walking around small cat. Reviewed [employer] for [employer] pantry resources.  Provided contact information and addresses. Encouraged Ms. Afzal to also contact [facility] and provided contact information. Discussed this is writer's last home visit. Provided food bag as courtesy of [place] [employer].  Plan: Make referral to VBCI CCM for complex care management services. Notify Aging Gracefully team of writer's last home visit.   Goal: met                                                                 Pablo Hurst, MSN, RN, BSN [place]  [facility], Healthy Communities RN Case Manager for Aging Gracefully Direct Dial:  [phone]                               

## 2023-11-10 NOTE — Patient Instructions (Signed)
 Visit Information  Thank you for taking time to visit with me today. Please don't hesitate to contact me if I can be of assistance to you before our next scheduled home appointment.  Following are the goals we discussed today:   Goals Addressed               This Visit's Progress     COMPLETED: [name]        08/02/23  Assessment: Pamela Daniel reports having chronic sob and dizziness. She has close follow up with her PCP, pulmonologist, and cardiologist. She reports having 3 falls this year. She uses a rollator as needed. States she has AFIB and COPD. Wears CPAP at night and as needed. Has PFT scheduled in October. Recent medication adjustments made. Reports carrying her phone with her at all times. Does not have a medical alert system. However, her daughter checks on her frequently and takes Pamela Daniel to appointments. Pamela Daniel still drives but states she does not when she is not feeling well. Endorses taking frequent rest breaks  and naps due to sob or dizziness.   Intervention: Provided booklet with chronic conditions and discussed in detail when to call MD or 911 regarding symptoms related to COPD, CHF, AFIB. Encouraged Pamela Daniel to take medications consistently and as prescribed. Discussed fall precautions. Discussed referral for VBCI CCM services. Pamela Daniel states she would rather wait on VBCI referral being made at this time. States it's so much going on. States we can discuss referral at later time.  Plan: Scheduled next follow up visit for Thursday, August 21st 10am. Will discuss VBCI CCM referral for complex care management needs closer to end of AG RN home visits.  CLIENT/RN ACTION PLAN - FALL PREVENTION  Registered Nurse:  Pablo Hurst Date: 08/02/23  Client Name: Pamela Daniel Client ID:    Target Area:  FALL PREVENTION    Why Problem May Occur: When dizzy    Target Goal: No falls over the next 160 days   STRATEGIES Coping Strategies Ideas  Change  Positions Slowly: lying to sitting, sitting to standing Reviewed 08/02/23 Reviewed 09/08/23 Reviewed 10/13/23 Reviewed 11/10/23 Changing positions can make people lightheaded. When getting up in the morning sit for a few minutes, before standing.  Stand for a few minutes before walking and hold on to sturdy furniture or countertop.    Drink water Reviewed 08/02/23 Reviewed 09/08/23 Reviewed 10/13/23 Reviewed 11/10/23  Dehydration can make people dizzy. Ask your healthcare provider how much water you can drink. Decrease caffeine, caffeine makes you urinate a lot, which can make you dehydrated.   If you fall, tell someone Reviewed 08/02/23 Reviewed 09/08/23 Reviewed 10/13/23 Reviewed 11/10/23 Tell a friend or family member even if you didn't get hurt. Tell your healthcare provider if you fall.  They can help you figure out why.   Get your vision and hearing checked Reviewed 08/02/23 Reviewed 09/08/23 Reviewed 10/13/23 Reviewed 11/10/23 Poor vision and hearing loss can make people fall. Glaucoma and diabetes can cause poor vision.   Other    Prevention Ideas  Review your Medicines Reviewed 08/02/23 Reviewed 09/08/23 Reviewed 10/13/23 Reviewed 11/10/23 Many medicines can make you dizzy or sleepy and increase your risk of falling. Your Aging Gracefully Nurse will look at your medications and see if you are taking any that might cause falling. Take medications as prescribed  Activity and Exercise Reviewed 08/02/23 Reviewed 09/08/23 Reviewed 10/13/23 Reviewed 11/10/23 Aging Gracefully exercises Walking (inside or outside) [employer]  Housework:  cooking, Education officer, environmental, laundry Exercise while watching TV Swimming or water aerobics.   Strengthen Bones Reviewed 08/02/23 Reviewed 09/08/23 Reviewed 10/13/23 Reviewed 11/10/23 Exercise makes your bones stronger. Vitamin D  and Calcium  make your bones stronger.  Ask your Healthcare provider if you should be taking them. Your body makes vitamin  D from the sun.  Sit in the sun for 10 minutes every day (without sunscreen).   Control your urine  Reviewed 08/02/23 Reviewed 09/08/23 Reviewed 10/13/23 Reviewed 11/10/23 Prevent constipation Cut back on caffeinated drinks. Don't wait to urinate. If diabetic, control your blood sugar. Ask your Aging Gracefully Nurse and Healthcare Provider  about urine control.   Control your blood sugar (if you are diabetic) High blood sugar can cause frequent urination, poor vision, and numbness in your feet, which can make you fall.   Low blood sugar can cause confusion and dizziness.   Other coping strategies 1. 2. 3. 4. 5.   PRACTICE It is important to practice the strategies so we can determine if they will be effective in helping to reach the goal.    Follow these specific recommendations:        If strategy does not work the first time, try it again.     We may make some changes over the next few sessions.       Pablo Hurst, MSN, RN, BSN [facility], Healthy Communities RN Case Manager for Aging Gracefully Direct Dial: [phone]      09/08/23  Assessment: Pamela Daniel reports doing great overall. States she looks and feels like a new person since her cardiologist adjusted her medications. Reports recent results of her long term cardiac monitoring for 14 days did not show atrial fib but brief episodes of SVT. States she is being treated with medications and no further procedures are required at this time. Pamela Daniel, Pamela Daniel expresses gratitude and appreciation for her great team of doctors. States wearing her CPAP nightly also helps her feel better. Denies any dizziness, fatigue, SOB, fatigue, or falls. Reports pain 0 out of 10. Reports having left shoulder injection on yesterday. States she is willing to engage with VBCI CCM team towards the end of AG RN visits. States she has multiple upcoming doctors' appointments. Therefore, she does not want to add anything  else to her plate. Also states she does not answer unknown calls. If she does not answer the phone, she prefers text messages instead of voicemail messages.  Interventions: Demonstrated  Aging Gracefully home exercises. Pamela Daniel actively engaged for return demonstration. Provided Aging Gracefully booklet and encouraged practice of exercises multiple times a day.  Discussed fall precautions. Celebrated with Pamela Daniel for feeling and doing so much better. Discussed it was a night and day difference from last AG RN home visit vs today's visit. Pamela Daniel'. Provided bag of food as courtesy of [employer] [facility] [employer].  Plan: Scheduled next home AG visit for 10/13/23  Pablo Hurst, MSN, RN, BSN [place]  [facility], Healthy Communities RN Case Manager for Aging Gracefully Direct Dial: [phone]      10/13/23  Assessment: Pamela Daniel reports feeling better from a bout of nausea, vomiting, and diarrhea that prompted her to go to ED last month. States her energy is coming back slowly by surely. States she has also had issues with back pain lately. Reports current pain 2 out of 10. States she took muscle relaxer prior to visit which helped. States she has slowed  down considerably due to recent health issues. Denies SOB or dizziness at the time of visit. States she no longer has dizziness since Cardiologist changed medications. Reports upcoming appointments with Uro/Gyn and Pulmonologist. Agreeable to VBCI CCM referral upon last AG RN home visit next month.   Interventions: Discussed fall precautions. Encouraged AG home exercises. Discussed slow movement during position changes. Encouraged frequent rest breaks when doing house work.  Plan: Scheduled next AG RN home visit for 11/10/23 at 10am.   Pablo Hurst, MSN, RN, BSN [place]  [facility], Healthy Communities RN Case Manager for Aging Gracefully Direct Dial: [phone]       11/10/23  Assessment: Pamela Daniel is doing well overall. Denies falls and dizziness. Reports back pain 5 out of 10. Enjoys her new cat. States stomach issues have resolved. Reports going to outpatient therapy for vaginal prolapse. Also does exercises at home. States she has been making slower intentional movement with position changes. Keeps rollator in care when out and about. States recommendations from pulmonologist have been helpful.   Interventions: Discussed VBCI CCM follow up. Pamela Daniel is agreeable. Discussed fall precautions. Discussed safety measures when walking around small cat. Reviewed [employer] for [employer] pantry resources.  Provided contact information and addresses. Encouraged Pamela Daniel to also contact [facility] and provided contact information. Discussed this is writer's last home visit. Provided food bag as courtesy of [place] [employer].  Plan: Make referral to VBCI CCM for complex care management services. Notify Aging Gracefully team of writer's last home visit.   Goal: met                                                                    If you are experiencing a Mental Health or Behavioral Health Crisis or need someone to talk to, please call the Suicide and Crisis Lifeline: 988 call the [place]  National Suicide Prevention Lifeline: 340 455 4692 or TTY: 808-590-2586 TTY 719-282-8284) to talk to a trained counselor call 1-800-273-TALK (toll free, 24 hour hotline) go to [facility] [address] [place] [phone]) call 911   The patient verbalized understanding of instructions, educational materials, and care plan provided today and agreed to receive a mailed copy of patient instructions, educational materials, and care plan.   Pablo Hurst, MSN, RN, BSN [place]  [facility], Healthy Communities RN Case  Manager for Aging Gracefully Direct Dial: [phone]

## 2023-11-14 ENCOUNTER — Telehealth: Payer: Self-pay | Admitting: *Deleted

## 2023-11-14 NOTE — Progress Notes (Signed)
 Complex Care Management Note  Care Guide Note 11/14/2023 Name: Pamela Daniel MRN: [id number] DOB: July 16, 1950  Pamela Daniel is a 73 y.o. year old female who sees Larnell Hamilton, MD for primary care. I reached out to Heron GORMAN Budge by phone today to offer complex care management services.  [name] was given information about Complex Care Management services today including:   The Complex Care Management services include support from the care team which includes your Nurse Care Manager, Clinical Social Worker, or Pharmacist.  The Complex Care Management team is here to help remove barriers to the health concerns and goals most important to you. Complex Care Management services are voluntary, and the patient may decline or stop services at any time by request to their care team member.   Complex Care Management Consent Status: Patient agreed to services and verbal consent obtained.   Follow up plan:  Telephone appointment with complex care management team member scheduled for:  11/30/2023  Encounter Outcome:  Patient Scheduled  Thedford Franks, CMA [place]  [facility], [facility] Guide Direct Dial: [phone]  Fax: [phone] Website: [email or web address]

## 2023-11-28 ENCOUNTER — Ambulatory Visit: Admitting: Physical Therapy

## 2023-11-29 ENCOUNTER — Ambulatory Visit: Admitting: Obstetrics and Gynecology

## 2023-11-30 ENCOUNTER — Other Ambulatory Visit: Payer: Self-pay

## 2023-11-30 DIAGNOSIS — G459 Transient cerebral ischemic attack, unspecified: Secondary | ICD-10-CM

## 2023-11-30 NOTE — Patient Outreach (Signed)
 Complex Care Management   Visit Note  11/30/2023  Name:  Pamela Daniel MRN: [id number] DOB: 08-10-1950  Situation: Referral received for Complex Care Management related to Asthma I obtained verbal consent from Patient.  Visit completed with Patient  on the phone  Background:   Past Medical History:  Diagnosis Date   Allergy    Anxiety    Arthritis    SPINE   Asthma    Blood transfusion without reported diagnosis    Fatty liver    Fatty pancreas    First degree heart block    Frequency of urination    GERD (gastroesophageal reflux disease)    Hemorrhoids    Hepatic steatosis    Hiatal hernia    POST RESIDUAL SMALL HH PER IMAGING   History of acute pancreatitis 03/16/2015   History of adenomatous polyp of colon    tubular adenoma's 04-08-2014/   hyperplastic bening polyp 2000   History of concussion 2012   no residual   History of kidney stones    History of transient ischemic attack (TIA) 12/22/2011   no residual   History of vaginal dysplasia 2016   Hypertension    Pancreatic cyst    Pseudocyst of pancreas    PSVT (paroxysmal supraventricular tachycardia)    cardioloigst-  dr ladona--- last visit 2013 per pt and is currently followed by pcp   Rectal bleeding    S/P AV (atrioventricular) nodal ablation 01-21-2010    dr fernande   Simple renal cyst    bilateral per imaging   Urgency of urination    Urinary frequency    Urinary leakage     Assessment: patient reports food insecurity, but states that her family helps her as needed. However, she is receptive to obtaining community resources.  Patient Reported Symptoms:  Cognitive Cognitive Status: No symptoms reported, Alert and oriented to person, place, and time      Neurological Neurological Review of Symptoms: No symptoms reported    HEENT HEENT Symptoms Reported: No symptoms reported      Cardiovascular Cardiovascular Symptoms Reported: No symptoms reported, Dizziness (patient reports intermittent  dizziness/weakness that she attributes to stress) Cardiovascular Management Strategies: Routine screening, Medication therapy, Adequate rest Cardiovascular Self-Management Outcome: 3 (uncertain) Cardiovascular Comment: patient reports history of SVT and CHF. reports sometimes has palpitations and will do Valsalvo to stop it. weight about a week ago was 157lb (home scale).  Respiratory Respiratory Symptoms Reported: Shortness of breath Other Respiratory Symptoms: patient reports SOB has greatly improved since seeing pulmonologist on 10/25/23. Discussed patient instructions per pulmonoligist with patient. Additional Respiratory Details: patient instructed by pulmonologist to use rescue before activty or going shopping. Respiratory Management Strategies: Medication therapy, Adequate rest, Routine screening Respiratory Self-Management Outcome: 3 (uncertain)  Endocrine Endocrine Symptoms Reported: No symptoms reported Is patient diabetic?: No    Gastrointestinal Gastrointestinal Symptoms Reported: No symptoms reported      Genitourinary Genitourinary Symptoms Reported: Urgency Additional Genitourinary Details: reports overactive bladder. Patient states is getting better seeing a urogyn at this time. Pelvic Rehab program. Genitourinary Management Strategies: Adequate rest, Medication therapy, Exercise Genitourinary Self-Management Outcome: 4 (good)  Integumentary Additional Integumentary Details: patient reports intermittent crusty area to skin. She reports she has prescription cream to manage.    Musculoskeletal Musculoskelatal Symptoms Reviewed: Back pain, Difficulty walking, Limited mobility, Weakness, Unsteady gait Additional Musculoskeletal Details: uses rollator walker. patient reports she has started doing chair yoga Musculoskeletal Management Strategies: Adequate rest, Activity, Medication therapy Musculoskeletal Self-Management Outcome: 4 (  good) Falls in the past year?: Yes Number of  falls in past year: 1 or less (reports got up too fast) Was there an injury with Fall?: No Fall Risk Category Calculator: 1 Patient Fall Risk Level: Low Fall Risk    Psychosocial Psychosocial Symptoms Reported: Depression - if selected complete PHQ 2-9 Additional Psychological Details: Patient acknowledges signs/symptoms of depression, stress and anxiety. patient denies that she would ever hurt herself. she is receptive to [employer] referrral.     Quality of Family Relationships: supportive    11/30/2023    PHQ2-9 Depression Screening   Little interest or pleasure in doing things Nearly every day  Feeling down, depressed, or hopeless Nearly every day  PHQ-2 - Total Score 6  Trouble falling or staying asleep, or sleeping too much Nearly every day  Feeling tired or having little energy Nearly every day  Poor appetite or overeating  More than half the days  Feeling bad about yourself - or that you are a failure or have let yourself or your family down More than half the days  Trouble concentrating on things, such as reading the newspaper or watching television Nearly every day  Moving or speaking so slowly that other people could have noticed.  Or the opposite - being so fidgety or restless that you have been moving around a lot more than usual Nearly every day  Thoughts that you would be better off dead, or hurting yourself in some way More than half the days (states, I dont think about hurting myself, but I have thought it may be better if I just passed on)  PHQ2-9 Total Score 24  If you checked off any problems, how difficult have these problems made it for you to do your work, take care of things at home, or get along with other people Very difficult  Depression Interventions/Treatment Medication    There were no vitals filed for this visit.    Medications Reviewed Today     Reviewed by Dagon Budai M, RN (Registered Nurse) on 11/30/23 at 1142  Med List Status: <None>   Medication  Order Taking? Sig Documenting Provider Last Dose Status Informant  albuterol  (PROVENTIL ) (2.5 MG/3ML) 0.083% nebulizer solution [id number] Yes Take 3 mLs (2.5 mg total) by nebulization every 4 (four) hours as needed for wheezing or shortness of breath. Hunsucker, Donnice SAUNDERS, MD  Active   albuterol  (VENTOLIN  HFA) 108 (90 Base) MCG/ACT inhaler [id number] Yes Inhale 2 puffs into the lungs every 6 (six) hours as needed for wheezing or shortness of breath. Hunsucker, Donnice SAUNDERS, MD  Active   ALPRAZolam  (XANAX ) 0.5 MG tablet [id number] Yes Take 0.5 mg by mouth daily as needed for anxiety. [provider]  Active Self           Med Note JACKOLYN, ZEA J   Thu Jun 02, 2023  9:50 AM)    budesonide -formoterol  (SYMBICORT ) 160-4.5 MCG/ACT inhaler [id number] Yes Inhale 2 puffs into the lungs 2 (two) times daily. Hunsucker, Donnice SAUNDERS, MD  Active   clobetasol  cream (TEMOVATE ) 0.05 % [id number] Yes Apply topically. [provider]  Active   Clobetasol  Prop Emollient Base (CLOBETASOL  PROPIONATE E) 0.05 % emollient cream [id number]  Apply 1 Application topically 3 (three) times daily. Tid x 4 weeks, bid x 4 weeks, daily x 4 weeks, then 2x/week Fredirick Glenys RAMAN, MD  Active   estradiol (ESTRACE) 0.1 MG/GM vaginal cream [id number] Yes Place 0.5 g vaginally 2 (two) times a week. Place 0.5g nightly for  two weeks then twice a week after Zuleta, Kaitlin G, NP  Active   fluticasone  (FLONASE ) 50 MCG/ACT nasal spray [id number] Yes Place 2 sprays into both nostrils daily. [provider]  Active Self  furosemide  (LASIX ) 20 MG tablet [id number] Yes Take 1 tablet (20 mg total) by mouth daily as needed for edema or fluid. Ladona Heinz, MD  Active   losartan  (COZAAR ) 25 MG tablet [id number] Yes Take 1 tablet (25 mg total) by mouth every morning. Ladona Heinz, MD  Active   methocarbamol  (ROBAXIN ) 500 MG tablet [id number] Yes Take 500 mg by mouth every 6 (six) hours as needed for muscle spasms. [provider]  Active    metoprolol  succinate (TOPROL  XL) 25 MG 24 hr tablet [id number] Yes Take 1 tablet (25 mg total) by mouth daily. Ladona Heinz, MD  Active   nitroGLYCERIN  (NITROSTAT ) 0.4 MG SL tablet [id number] Yes DISSOLVE 1 TABLET UNDER THE TONGUE EVERY 5 MINUTES AS NEEDED FOR CHEST PAIN. DO NOT EXCEED A TOTAL OF 3 DOSES IN 15 MINUTES. CALL 911 IF NO RELIEF Fernand Denyse LABOR, MD  Active            Med Note JACKOLYN, EXIE JINNY Schaumann Jun 02, 2023  9:50 AM) prn  JESSE SCHLOSSMAN [id number]  Pt uses a cpap nightly [provider]  Active Self  pantoprazole  (PROTONIX ) 40 MG tablet [id number] Yes Take 40 mg by mouth daily. [provider]  Active Self  rosuvastatin  (CRESTOR ) 20 MG tablet [id number] Yes Take 1 tablet (20 mg total) by mouth every morning. Ladona Heinz, MD  Active   spironolactone  (ALDACTONE ) 25 MG tablet [id number] Yes Take 1 tablet (25 mg total) by mouth every morning. Ladona Heinz, MD  Active   verapamil  (CALAN -SR) 120 MG CR tablet [id number] Yes Take 1 tablet (120 mg total) by mouth at bedtime.  Patient taking differently: Take 1 tablet (120 mg total) by mouth at bedtime.   Ladona Heinz, MD  Active   Vibegron 75 MG TABS [id number] Yes Take 1 tablet (75 mg total) by mouth daily. Zuleta, Kaitlin G, NP  Active   WEGOVY 1.7 MG/0.75ML EMMANUEL [id number] Yes Inject 1.7 mg into the skin. [provider]  Active           Recommendation:   Referral to: LCSW  scheduled for 12/28/23 at 1:00 pm regarding signs/symptoms of depression/stress Continue Current Plan of Care  Follow Up Plan:   Telephone follow up appointment date/time:  12/14/23 at 1:30 pm  Heddy Shutter, RN, MSN, BSN, CCM [place]  [facility], Population Health Case Manager Phone: [phone]

## 2023-11-30 NOTE — Patient Instructions (Addendum)
 Visit Information  Thank you for taking time to visit with me today. Please don't hesitate to contact me if I can be of assistance to you before our next scheduled appointment.  Our next appointment is by telephone on 12/14/23 at 1:30 pm  Please call the care guide team at [phone] if you need to cancel or reschedule your appointment.   Following is a copy of your care plan:   Goals Addressed             This Visit's Progress    VBCI RN Care Plan       Problems:  Chronic Disease Management support and education needs related to Asthma Upcoming appointment:  Goal: Over the next 90 days the Patient will continue to work with RN Care Manager and/or Social Worker to address care management and care coordination needs related to Asthma as evidenced by adherence to care management team scheduled appointments     demonstrate Improved adherence to prescribed treatment plan for Asthma as evidenced by patient and/or review of chart take all medications exactly as prescribed and will call provider for medication related questions as evidenced by patient report and/or review of chart     Interventions:  Asthma: Discussed the importance of adequate rest and management of fatigue with Asthma Screening for signs and symptoms of depression related to chronic disease state  Medications reviewed and RNCM encouraged patient to take as prescribed Reviewed upcoming appointment and confirmed patient has transportation Assigned emmi video education: Asthma action plan: asthma-cold and flu season, Asthma.  Patient Self-Care Activities:  Attend all scheduled provider appointments Call provider office for new concerns or questions  Take medications as prescribed    Plan:  Telephone follow up appointment with care management team member scheduled for:  12/14/23 at 1:30 pm      Please call the Suicide and Crisis Lifeline: 988 call the [place]  National Suicide Prevention Lifeline: 574-400-6208 or  TTY: 250-272-2011 TTY 308-320-8625) to talk to a trained counselor if you are experiencing a Mental Health or Behavioral Health Crisis or need someone to talk to.  Patient verbalized understanding of Care plan and visit instructions communicated this visit  Heddy Shutter, RN, MSN, BSN, CCM [place]  [facility], Population Health Case Manager Phone: [phone]

## 2023-12-05 ENCOUNTER — Telehealth: Payer: Self-pay

## 2023-12-05 ENCOUNTER — Encounter: Payer: Self-pay | Admitting: Physical Therapy

## 2023-12-05 ENCOUNTER — Ambulatory Visit: Attending: Obstetrics and Gynecology | Admitting: Physical Therapy

## 2023-12-05 DIAGNOSIS — M62838 Other muscle spasm: Secondary | ICD-10-CM | POA: Diagnosis not present

## 2023-12-05 DIAGNOSIS — R293 Abnormal posture: Secondary | ICD-10-CM | POA: Diagnosis not present

## 2023-12-05 DIAGNOSIS — R279 Unspecified lack of coordination: Secondary | ICD-10-CM | POA: Diagnosis not present

## 2023-12-05 DIAGNOSIS — M6281 Muscle weakness (generalized): Secondary | ICD-10-CM | POA: Insufficient documentation

## 2023-12-05 NOTE — Patient Instructions (Addendum)
 Urge Incontinence  Ideal urination frequency is every 2-4 wakeful hours, which equates to 5-8 times within a 24-hour period.   Urge incontinence is leakage that occurs when the bladder muscle contracts, creating a sudden need to go before getting to the bathroom.   Going too often when your bladder isn't actually full can disrupt the body's automatic signals to store and hold urine longer, which will increase urgency/frequency.  In this case, the bladder "is running the show" and strategies can be learned to retrain this pattern.   One should be able to control the first urge to urinate, at around .  The bladder can hold up to a "grande latte," or . To help you gain control, practice the Urge Drill below when urgency strikes.  This drill will help retrain your bladder signals and allow you to store and hold urine longer.  The overall goal is to stretch out your time between voids to reach a more manageable voiding schedule.    Practice your quick flicks often throughout the day (each waking hour) even when you don't need feel the urge to go.  This will help strengthen your pelvic floor muscles, making them more effective in controlling leakage.  Urge Drill  When you feel an urge to go, follow these steps to regain control: Stop what you are doing and be still Take one deep breath, directing your air into your abdomen Think an affirming thought, such as "I've got this." Do 5 quick flicks of your pelvic floor Walk with control to the bathroom to void, or delay voiding   Double Voiding can be a very useful technique to help overcome incomplete emptying of your bladder.  Incomplete emptying of urine can result in leakage after using the bathroom and increase the risk of urinary tract infection.   Initial Void: When you first sit down to urinate, ensure optimal positioning for bladder emptying by following these guidelines for toileting posture: Sit on the toilet seat - don't hover  over the seat Support your trunk by placing your hands on your knees or thighs Spread your knees and hips wide Position your feet flat on the floor or elevate feet on phone books, foot stool (Squatty Potty), or wrapped toilet paper rolls (if having knees above hips helps you empty) Lean forward from your hips Maintain the normal inward curve in your lower back   Repeated Void: After your initial void is complete, follow these movement patterns and attempt going to the bathroom again. Stand up Rotate your hips as if doing hula hoop in one direction Rotate using the same action in the other direction Rock your hips and pelvis back and forwards (pelvic tilts) Rock your hips and pelvis side to side (tail wag) Sit back down and repeat your voiding technique This technique can be repeated as many times as you choose to help you empty your bladder more effectively.   THE KNACK  The Knack is a strategy you may use to help to reduce or prevent leakage or passing of urine, gas or feces during an activity that causes downward force on the pelvic floor muscles.    Activities that can cause downward pressure on the pelvic floor muscles include coughing, sneezing, laughing, bending, lifting, and transitioning from different body positions such as from laying down to sitting up and sitting to standing.  To perform The Knack, consciously squeeze and lift your pelvic floor muscles to perform a strong, well-timed pelvic muscle contraction BEFORE AND DURING these  activities above.  As your contraction gets more coordinated and your muscles get stronger, you will become more effective in controlling your experience of incontinence or gas passing during these activities.    [facility] [address] [place], [place] [address] Phone # [phone] Fax [phone]   Moisturizers They are used in the vagina to hydrate the mucous membrane that make up the vaginal  canal. Designed to keep a more normal acid balance (ph) Use daily at bedtime  Ingredients to avoid is glycerin and fragrance, can increase chance of infection   Creams to use externally on the Vulva area Marathon Oil (good for for cancer patients that had radiation to the area)- amazon or [employer]ell rubbermaid.[email or web address]://garcia-[email or web address]/ Vulva Balm/ V-magic cream by medicine mama- amazon Julva-amazon Vital V Wild Yam salve ( help moisturize and help with thinning vulvar area, does have Beeswax MoodMaid Botanical Pro-Meno Wild Yam Cream- Amazon Desert Harvest Gele Cleo by Sherrlyn labial moisturizer (Amazon),  Coconut or olive oil aloe Good Clean Love Enchanted Rose by intimate rose momotaro  Things to avoid in the vaginal area Do not use things to irritate the vulvar area No lotions just specialized creams for the vulva area- Neogyn, V-magic,  No soaps; can use Aveeno or Calendula cleanser, unscented Dove if needed. Must be gentle No deodorants No douches Good to sleep without underwear to let the vaginal area to air out No scrubbing: spread the lips to let warm water rinse over labias and pat dry

## 2023-12-05 NOTE — Progress Notes (Signed)
   Telephone encounter was:  Successful.  Complex Care Management Note Care Guide Note  12/05/2023 Name: [name] MRN: [id number] DOB: [birth year]  Pamela Daniel is a 73 y.o. year old female who is a primary care patient of Larnell Hamilton, MD . The community resource team was consulted for assistance with Food Insecurity, Home Modifications, and Financial Difficulties related to financial strain  SDOH screenings and interventions completed:  Yes  Social Drivers of Health From This Encounter   Food Insecurity: Food Insecurity Present (12/05/2023)   Hunger Vital Sign    Worried About Running Out of Food in the Last Year: Often true    Ran Out of Food in the Last Year: Often true  Financial Resource Strain: High Risk (12/05/2023)   Overall Financial Resource Strain (CARDIA)    Difficulty of Paying Living Expenses: Very hard  Utilities: At Risk (12/05/2023)   Utilities    Threatened with loss of utilities: Yes    SDOH Interventions Today    Flowsheet Row Most Recent Value  SDOH Interventions   Food Insecurity Interventions Community Resources Provided, [place] Resources Referral  Utilities Interventions Community Resources Provided, Social Worker Strain Interventions Community Resources Provided, [employer] Referral     Care guide performed the following interventions: Patient provided with information about care guide support team and interviewed to confirm resource needs.Pt is requesting I Nurse, Mental Health, Surveyor, Quantity,  Independant Living for housing Archivist.   Follow Up Plan:  No further follow up planned at this time. The patient has been provided with needed resources.  Encounter Outcome:  Patient Visit Completed    Jon Colt [facility]  [facility], Phone: [phone] Fax: [phone] Website: [email or web address]
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
## 2023-12-08 ENCOUNTER — Ambulatory Visit: Admitting: Physical Therapy

## 2023-12-08 DIAGNOSIS — R279 Unspecified lack of coordination: Secondary | ICD-10-CM | POA: Diagnosis not present

## 2023-12-08 DIAGNOSIS — R293 Abnormal posture: Secondary | ICD-10-CM | POA: Diagnosis not present

## 2023-12-08 DIAGNOSIS — M62838 Other muscle spasm: Secondary | ICD-10-CM | POA: Diagnosis not present

## 2023-12-08 DIAGNOSIS — M6281 Muscle weakness (generalized): Secondary | ICD-10-CM
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
## 2023-12-12 ENCOUNTER — Ambulatory Visit: Admitting: Physical Therapy

## 2023-12-14 ENCOUNTER — Ambulatory Visit: Admitting: Physical Therapy

## 2023-12-14 ENCOUNTER — Other Ambulatory Visit: Payer: Self-pay

## 2023-12-14 DIAGNOSIS — M6281 Muscle weakness (generalized): Secondary | ICD-10-CM

## 2023-12-14 NOTE — Patient Outreach (Signed)
 Complex Care Management   Visit Note  12/14/2023  Name:  Pamela Daniel MRN: [id number] DOB: 12/14/1950  Situation: Referral received for Complex Care Management related to referral per [facility] nurse for resources I obtained verbal consent from Patient.  Visit completed with Patient  on the phone  Background:   Past Medical History:  Diagnosis Date   Allergy    Anxiety    Arthritis    SPINE   Asthma    Blood transfusion without reported diagnosis    Fatty liver    Fatty pancreas    First degree heart block    Frequency of urination    GERD (gastroesophageal reflux disease)    Hemorrhoids    Hepatic steatosis    Hiatal hernia    POST RESIDUAL SMALL HH PER IMAGING   History of acute pancreatitis 03/16/2015   History of adenomatous polyp of colon    tubular adenoma's 04-08-2014/   hyperplastic bening polyp 2000   History of concussion 2012   no residual   History of kidney stones    History of transient ischemic attack (TIA) 12/22/2011   no residual   History of vaginal dysplasia 2016   Hypertension    Pancreatic cyst    Pseudocyst of pancreas    PSVT (paroxysmal supraventricular tachycardia)    cardioloigst-  dr ladona--- last visit 2013 per pt and is currently followed by pcp   Rectal bleeding    S/P AV (atrioventricular) nodal ablation 01-21-2010    dr fernande   Simple renal cyst    bilateral per imaging   Urgency of urination    Urinary frequency    Urinary leakage     Assessment: Patient Reported Symptoms:  Cognitive Cognitive Status: No symptoms reported      Neurological Neurological Review of Symptoms: Other: [place] Neurological Symptoms/Conditions [RPT]: patient with concerns memory issues-patient reports she will call to schedule an appointment with PCP to discuss. patient reports history of TIA. RNCM reiterated s/s of TIA and reinforced seek urgent medical attention if signs/symptoms.    HEENT HEENT Symptoms Reported: No symptoms reported       Cardiovascular Cardiovascular Symptoms Reported: Dizziness Does patient have uncontrolled Hypertension?: No Cardiovascular Management Strategies: Routine screening, Medication therapy, Activity Cardiovascular Self-Management Outcome: 3 (uncertain) Cardiovascular Comment: BP checked by outpatient PT this morning. Patient reports systolic was 130's/70's. Patient reports ongoing chronic dizziness has improved, but noticed a little more the past two days.  Respiratory Respiratory Symptoms Reported: Shortness of breath Other Respiratory Symptoms: patient reports my Asthma is doing pretty good since seeing Dr. Annella RNCM reiterated instructions from pulmonologist to use rescue inhaler before activity or going shopping. Respiratory Management Strategies: Medication therapy  Endocrine Endocrine Symptoms Reported: No symptoms reported Is patient diabetic?: No    Gastrointestinal Gastrointestinal Symptoms Reported: No symptoms reported Additional Gastrointestinal Details: patient reports diarrhea last night, but has resolved. Patients states could have been something she ate.      Genitourinary Genitourinary Symptoms Reported: No symptoms reported Additional Genitourinary Details: patient reports urinary urgency is improving since going to Pelvic rehab    Integumentary Integumentary Symptoms Reported: No symptoms reported    Musculoskeletal Musculoskelatal Symptoms Reviewed: Weakness Additional Musculoskeletal Details: patient reports back pain and difficulty walking is improving since interventions with Pelvic Rehab Musculoskeletal Management Strategies: Activity, Medication therapy Musculoskeletal Self-Management Outcome: 4 (good)      Psychosocial Additional Psychological Details: RNCM reiterated LCSW scheduled to call on 12/28/23  There were no vitals filed for this visit.    Medications Reviewed Today     Reviewed by Nicki Gracy M, RN (Registered Nurse) on 12/14/23  at 1346  Med List Status: <None>   Medication Order Taking? Sig Documenting Provider Last Dose Status Informant  albuterol  (PROVENTIL ) (2.5 MG/3ML) 0.083% nebulizer solution [id number] Yes Take 3 mLs (2.5 mg total) by nebulization every 4 (four) hours as needed for wheezing or shortness of breath. Hunsucker, Donnice SAUNDERS, MD  Active   albuterol  (VENTOLIN  HFA) [phone] Base) MCG/ACT inhaler [id number] Yes Inhale 2 puffs into the lungs every 6 (six) hours as needed for wheezing or shortness of breath. Hunsucker, Donnice SAUNDERS, MD  Active   ALPRAZolam  (XANAX ) 0.5 MG tablet [id number] Yes Take 0.5 mg by mouth daily as needed for anxiety. [provider]  Active Self           Med Note JACKOLYN, ZEA J   Thu Jun 02, 2023  9:50 AM)    budesonide -formoterol  (SYMBICORT ) 160-4.5 MCG/ACT inhaler [id number] Yes Inhale 2 puffs into the lungs 2 (two) times daily. Hunsucker, Donnice SAUNDERS, MD  Active   clobetasol  cream (TEMOVATE ) 0.05 % [id number] Yes Apply topically. [provider]  Active   Clobetasol  Prop Emollient Base (CLOBETASOL  PROPIONATE E) 0.05 % emollient cream [id number]  Apply 1 Application topically 3 (three) times daily. Tid x 4 weeks, bid x 4 weeks, daily x 4 weeks, then 2x/week Fredirick Glenys RAMAN, MD  Active   estradiol  (ESTRACE ) 0.1 MG/GM vaginal cream [id number] Yes Place 0.5 g vaginally 2 (two) times a week. Place 0.5g nightly for two weeks then twice a week after Zuleta, Kaitlin G, NP  Active   fluticasone  (FLONASE ) 50 MCG/ACT nasal spray [id number] Yes Place 2 sprays into both nostrils daily. [provider]  Active Self  furosemide  (LASIX ) 20 MG tablet [id number] Yes Take 1 tablet (20 mg total) by mouth daily as needed for edema or fluid. Ladona Heinz, MD  Active   losartan  (COZAAR ) 25 MG tablet [id number] Yes Take 1 tablet (25 mg total) by mouth every morning. Ladona Heinz, MD  Active   methocarbamol  (ROBAXIN ) 500 MG tablet [id number] Yes Take 500 mg by mouth every 6 (six) hours as needed for  muscle spasms. [provider]  Active   metoprolol  succinate (TOPROL  XL) 25 MG 24 hr tablet [id number] Yes Take 1 tablet (25 mg total) by mouth daily. Ladona Heinz, MD  Active   nitroGLYCERIN  (NITROSTAT ) 0.4 MG SL tablet [id number] Yes DISSOLVE 1 TABLET UNDER THE TONGUE EVERY 5 MINUTES AS NEEDED FOR CHEST PAIN. DO NOT EXCEED A TOTAL OF 3 DOSES IN 15 MINUTES. CALL 911 IF NO RELIEF Fernand Denyse LABOR, MD  Active            Med Note JACKOLYN, EXIE JINNY Schaumann Jun 02, 2023  9:50 AM) prn  JESSE SCHLOSSMAN [id number]  Pt uses a cpap nightly [provider]  Active Self  pantoprazole  (PROTONIX ) 40 MG tablet [id number] Yes Take 40 mg by mouth daily. [provider]  Active Self  rosuvastatin  (CRESTOR ) 20 MG tablet [id number] Yes Take 1 tablet (20 mg total) by mouth every morning. Ladona Heinz, MD  Active   spironolactone  (ALDACTONE ) 25 MG tablet [id number] Yes Take 1 tablet (25 mg total) by mouth every morning. Ladona Heinz, MD  Active   verapamil  (CALAN -SR) 120 MG CR tablet [id number] Yes Take 1 tablet (120 mg total) by mouth at bedtime. Ladona Heinz, MD  Active   Vibegron  75 MG TABS [id number] Yes Take 1 tablet (75 mg total) by mouth daily. Zuleta, Kaitlin G, NP  Active   WEGOVY 1.7 MG/0.75ML EMMANUEL [id number] Yes Inject 1.7 mg into the skin. [provider]  Active           Recommendation:   Continue Current Plan of Care Patient to call to schedule appointment with PCP regarding memory concerns  Follow Up Plan:   Telephone follow up appointment date/time:  01/17/24 at 2:00 pm  Heddy Shutter, RN, MSN, BSN, CCM [place]  [facility], Population Health Case Manager Phone: [phone]

## 2023-12-14 NOTE — Patient Instructions (Signed)
 Visit Information  Thank you for taking time to visit with me today. Please don't hesitate to contact me if I can be of assistance to you before our next scheduled appointment.  Your next care management appointment is by telephone on 01/17/24 at 2:00 pm  Please call the care guide team at [phone] if you need to cancel, schedule, or reschedule an appointment.   Please call the Suicide and Crisis Lifeline: 988 call the [place]  National Suicide Prevention Lifeline: 862-675-3788 or TTY: 254-585-0066 TTY 934 511 7068) to talk to a trained counselor if you are experiencing a Mental Health or Behavioral Health Crisis or need someone to talk to.  Heddy Shutter, RN, MSN, BSN, CCM   [facility], Population Health Case Manager Phone: [phone]

## 2023-12-14 NOTE — Therapy (Signed)
 Pt arrived for appointment, brought to treatment room with PT and pt stated she was feeling dizzy and unwell but didn't want to cancel appointment last minute worried about policy or fee. PT took pt's BP to be 135/72 HR 75.PT attempted to offer supine or hooklying exercises however at this point in session pt reports very apologetic but states she is just not feeling well and would like to end session. Pt's daughter had driven her clinic so pt not driving today. Denied additional needs from PT. Session ended early, <8 mins - arrived no charge.   Darryle Navy, PT, DPT 12/13/2508:06 AM  [facility] [address] [place], [place] [address] Phone # [phone] Fax [phone]

## 2023-12-19 ENCOUNTER — Ambulatory Visit: Attending: Obstetrics and Gynecology | Admitting: Physical Therapy

## 2023-12-19 DIAGNOSIS — R293 Abnormal posture: Secondary | ICD-10-CM | POA: Diagnosis present

## 2023-12-19 DIAGNOSIS — R279 Unspecified lack of coordination: Secondary | ICD-10-CM | POA: Diagnosis present

## 2023-12-19 DIAGNOSIS — M6281 Muscle weakness (generalized): Secondary | ICD-10-CM | POA: Diagnosis present

## 2023-12-19 DIAGNOSIS — M62838 Other muscle spasm: Secondary | ICD-10-CM | POA: Diagnosis present
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
## 2023-12-21 ENCOUNTER — Ambulatory Visit: Admitting: Physical Therapy

## 2023-12-21 DIAGNOSIS — R279 Unspecified lack of coordination: Secondary | ICD-10-CM

## 2023-12-21 DIAGNOSIS — R293 Abnormal posture: Secondary | ICD-10-CM

## 2023-12-21 DIAGNOSIS — M6281 Muscle weakness (generalized): Secondary | ICD-10-CM
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
## 2023-12-26 ENCOUNTER — Ambulatory Visit: Admitting: Physical Therapy

## 2023-12-28 ENCOUNTER — Telehealth: Admitting: *Deleted

## 2023-12-28 ENCOUNTER — Ambulatory Visit: Admitting: Physical Therapy

## 2023-12-28 DIAGNOSIS — M6281 Muscle weakness (generalized): Secondary | ICD-10-CM | POA: Diagnosis not present

## 2023-12-28 DIAGNOSIS — R279 Unspecified lack of coordination: Secondary | ICD-10-CM

## 2023-12-28 DIAGNOSIS — R293 Abnormal posture: Secondary | ICD-10-CM
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
## 2024-01-02 ENCOUNTER — Ambulatory Visit: Admitting: Physical Therapy

## 2024-01-02 DIAGNOSIS — M6281 Muscle weakness (generalized): Secondary | ICD-10-CM | POA: Diagnosis not present

## 2024-01-02 DIAGNOSIS — R293 Abnormal posture: Secondary | ICD-10-CM

## 2024-01-02 DIAGNOSIS — R279 Unspecified lack of coordination: Secondary | ICD-10-CM
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
## 2024-01-04 ENCOUNTER — Ambulatory Visit: Admitting: Physical Therapy

## 2024-01-05 ENCOUNTER — Telehealth

## 2024-01-09 ENCOUNTER — Other Ambulatory Visit: Payer: Self-pay | Admitting: Specialist

## 2024-01-09 ENCOUNTER — Ambulatory Visit: Admitting: Physical Therapy

## 2024-01-09 DIAGNOSIS — M6281 Muscle weakness (generalized): Secondary | ICD-10-CM

## 2024-01-09 DIAGNOSIS — M62838 Other muscle spasm: Secondary | ICD-10-CM

## 2024-01-09 DIAGNOSIS — R293 Abnormal posture: Secondary | ICD-10-CM
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
## 2024-01-10 NOTE — Patient Outreach (Signed)
 [facility]  OT FINAL Visit  01/10/2024  Pamela Daniel November 23, 1950 [id number]  Visit:  4- Fourth Visit  Start Time:  1600 End Time:  1645 Total Minutes:  45  Readiness to Change:  Readiness to Change Score: 10  Home Environment Assessment:    Durable Medical Equipment: Durable Medical Equipment: Shower Chair With Back Durable Medical Equipment Distribution Date: 01/09/24  Patient Education: Education Provided: Yes Education Details: educated on how to use tips for aging in place booklet Person(s) Educated: Patient, Child(ren) Comprehension: Verbalized Understanding, Returned Demonstration  Goals:  Goals Addressed             This Visit's Progress    COMPLETED: Patient Stated   On track    Patient will improve bathroom safety.  Name _________________________________             Date__________________  OT ACTION PLAN: Functional Mobility  Target Problem Area:   Decreased independence with bathroom transfers and accessing sunroom  Why Problem May Occur:     No grab bars Tub difficult to navigate Steps are steep Commode is low to ground Back and leg pain Fatigues easily            Target Goal(s):   Safely and independently access bathroom and sunroom    STRATEGIES   Saving Your Energy DO:  Take breaks  Raise the height of surfaces   Remove tripping hazards  (Other):   (Other):   (Other):     Modifying your home environment and making it safe    DO:  X Install grab bars in the bathroom  Remove or strongly secure throw rugs  X use shower seat with back.   (Other):   (Other):     Simplifying the way you set up tasks or daily routines DO:  Move slowly   (Other):   (Other):   (Other):   (Other):      PRACTICE  Based on what we have talked about, you are willing to  try:  ________________________________________________________________  ________________________________________________________________  If a strategy does not work the first time, try it again (and again).  We may make some changes over the next few sessions, based on how they work.   Pamela Daniel, MHA , OT/L ________________________________________________   _12/23/25_________ Occupational Therapist          Date         COMPLETED: Patient Stated       Patient will improve safety entering and exiting sunroom/livingroom area.        Post Clinical Reasoning: Client Action (Goal) Three Interventions: Patient will improve safety and independence with bathroom transfers and accessing her sunporch Did Client Try?: Yes Targeted Problem Area Status: A Lot Better Clinician View Of Client Situation:: Ms. Scheeler has made significant progress with her health, independence, and safety with ADLs over the past 6 months.  She feels she has met all OT goals, and I agree.  I am very proud of the progress that she has made. Client View Of His/Her Situation:: doing very well.  has been doing the exercises from nursing and as well as her current OP PT sessions.  She is pleased with her progress and is ready for discharge. Next Visit Plan:: DC from [facility] program - all OT goals met.  Pamela Daniel, MHA, OT/L [phone]

## 2024-01-11 ENCOUNTER — Ambulatory Visit: Admitting: Physical Therapy

## 2024-01-11 DIAGNOSIS — M6281 Muscle weakness (generalized): Secondary | ICD-10-CM
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
## 2024-01-17 ENCOUNTER — Other Ambulatory Visit: Payer: Self-pay

## 2024-01-17 NOTE — Patient Instructions (Addendum)
 Visit Information  Thank you for taking time to visit with me today. Please don't hesitate to contact me if I can be of assistance to you before our next scheduled appointment.  Your next care management appointment is by telephone on 02/13/24 at 1:30 pm  Please call the care guide team at [phone] if you need to cancel, schedule, or reschedule an appointment.   Please call the Suicide and Crisis Lifeline: 988 call the [place]  National Suicide Prevention Lifeline: 331-830-9762 or TTY: (403)372-8517 TTY 779-634-4654) to talk to a trained counselor call 1-800-273-TALK (toll free, 24 hour hotline) if you are experiencing a Mental Health or Behavioral Health Crisis or need someone to talk to.  Heddy Shutter, RN, MSN, BSN, CCM Wanda  [facility], Population Health Case Manager Phone: [phone]      Advance Directive  Advance directives are legal papers that state your wishes about health care decisions. They let your wishes be known to family, friends, and health care providers if you become unable to speak for yourself.  You should write these papers out over time rather than all at once. They can be changed and updated at any time. The types of advance directives include: Medical power of attorney (POA). Living wills. Do not resuscitate (DNR) or do not attempt resuscitation (DNAR) orders. What are a health care proxy and medical POA? A health care proxy is also called a health care agent. It's a person you choose to make medical decisions for you when you can't make them for yourself. In most cases, a proxy is a trusted friend or family member. A medical POA is legal paperwork that names your proxy. It may need to be: Signed. Notarized. Dated. Copied. Witnessed. Added to your medical record. You may also want to choose someone to handle your money if you can't do so. This is called a durable POA for finances. It's separate from a medical POA. You may choose  your health care proxy or someone else to act as your agent in money matters. If you don't have a proxy, or if the proxy may not be acting in your best interest, a court may choose a guardian to act on your behalf. What is a living will? A living will is legal paperwork that states your wishes about medical care. Providers should keep a copy of it in your medical record. You may want to give a copy to family members or friends. You can also keep a card in your wallet to let loved ones know you have a living will and where they can find it. A living will may be used if: You're very sick with something that will end your life. You become disabled. You can't make decisions or speak for yourself. Your living will should include whether: To use or not use life support equipment. This may include machines to filter your blood or to help you breathe. You want a DNR or DNAR order. This tells providers not to use CPR if your heart or breathing stops. To use or not use tube feeding. You want to be given foods and fluids. You want a type of comfort care called palliative care. This may be given when the goal for treatment becomes comfort rather than a cure. You want to donate your organs and tissues. A living will doesn't say what to do with your money and property if you pass away. What is a DNR or DNAR? A DNR or DNAR order is a request not  to have CPR. If you don't have one of these orders, a provider will try to help you if your heart stops or you stop breathing.  If you plan to have surgery, talk with your provider about your DNR or DNAR order. What happens if I don't have an advance directive? Each state has its own laws about advance directives. Some states assign family decision makers to act on your behalf if you don't have an advance directive.  Check with your provider, attorney, or state representative about the laws in your state. Where to find more information Each state has its own laws  about advance directives. You can look up these laws at: https://rodriguez-phillips.com/ This information is not intended to replace advice given to you by your health care provider. Make sure you discuss any questions you have with your health care provider. Document Revised: 05/24/2022 Document Reviewed: 05/24/2022 Elsevier Patient Education  2024 [employer].

## 2024-01-17 NOTE — Patient Outreach (Signed)
 Complex Care Management   Visit Note  01/17/2024  Name:  Pamela Daniel MRN: [id number] DOB: Jun 03, 1950  Situation: Referral received for Complex Care Management related to referral per [facility] nurse for resources I obtained verbal consent from Patient.  Visit completed with Patient  on the phone  Background:   Past Medical History:  Diagnosis Date   Allergy    Anxiety    Arthritis    SPINE   Asthma    Blood transfusion without reported diagnosis    Fatty liver    Fatty pancreas    First degree heart block    Frequency of urination    GERD (gastroesophageal reflux disease)    Hemorrhoids    Hepatic steatosis    Hiatal hernia    POST RESIDUAL SMALL HH PER IMAGING   History of acute pancreatitis 03/16/2015   History of adenomatous polyp of colon    tubular adenoma's 04-08-2014/   hyperplastic bening polyp 2000   History of concussion 2012   no residual   History of kidney stones    History of transient ischemic attack (TIA) 12/22/2011   no residual   History of vaginal dysplasia 2016   Hypertension    Pancreatic cyst    Pseudocyst of pancreas    PSVT (paroxysmal supraventricular tachycardia)    cardioloigst-  dr ladona--- last visit 2013 per pt and is currently followed by pcp   Rectal bleeding    S/P AV (atrioventricular) nodal ablation 01-21-2010    dr fernande   Simple renal cyst    bilateral per imaging   Urgency of urination    Urinary frequency    Urinary leakage     Assessment: Patient Reported Symptoms:  Cognitive Cognitive Status: No symptoms reported      Neurological Neurological Review of Symptoms: No symptoms reported    HEENT HEENT Symptoms Reported: No symptoms reported      Cardiovascular Cardiovascular Symptoms Reported: No symptoms reported (denies dizziness)    Respiratory Respiratory Symptoms Reported: No symptoms reported Other Respiratory Symptoms: Patient reports, I am doing much better with asthma. denies any difficulty at  this time.    Endocrine Endocrine Symptoms Reported: No symptoms reported Is patient diabetic?: No    Gastrointestinal Gastrointestinal Symptoms Reported: No symptoms reported      Genitourinary Genitourinary Symptoms Reported: No symptoms reported Additional Genitourinary Details: patient reports just completed Pelvic rehab-completed 2 month treatments. patient reports was successful with releaving pain and relieved pressure on bladder that was causing urgency.    Integumentary Integumentary Symptoms Reported: Other Other Integumentary Symptoms: Reports sores come up on skin at time and uses triamcinolone  cream which helps. managed by PCP.    Musculoskeletal Musculoskelatal Symptoms Reviewed: Back pain Additional Musculoskeletal Details: patient reports much improved and only has back pain with doing housework 4-5/10 but rest helps resolve pain.        Psychosocial Psychosocial Symptoms Reported: Anxiety - if selected complete GAD, Depression - if selected complete PHQ 2-9 Additional Psychological Details: patient reports she always has some anxiety and depression. However, she also reports she feels the Pelvic Rehab has helped improve her depression symptoms.          01/17/2024    PHQ2-9 Depression Screening   Little interest or pleasure in doing things Nearly every day  Feeling down, depressed, or hopeless Several days  PHQ-2 - Total Score 4  Trouble falling or staying asleep, or sleeping too much Not at all  Feeling tired or  having little energy Nearly every day  Poor appetite or overeating  Several days  Feeling bad about yourself - or that you are a failure or have let yourself or your family down Nearly every day  Trouble concentrating on things, such as reading the newspaper or watching television Nearly every day  Moving or speaking so slowly that other people could have noticed.  Or the opposite - being so fidgety or restless that you have been moving around a lot  more than usual Not at all  Thoughts that you would be better off dead, or hurting yourself in some way Several days (patient denies thought of hurting herself)  PHQ2-9 Total Score 15  If you checked off any problems, how difficult have these problems made it for you to do your work, take care of things at home, or get along with other people Somewhat difficult  Depression Interventions/Treatment Medication      01/17/2024    2:17 PM  GAD 7 : Generalized Anxiety Score  Nervous, Anxious, on Edge 3  Control/stop worrying 3  Worry too much - different things 3  Trouble relaxing 3  Restless 3  Easily annoyed or irritable 1  Afraid - awful might happen 1  Total GAD 7 Score 17  Anxiety Difficulty Somewhat difficult    There were no vitals filed for this visit. Pain Scale: 0-10 Pain Score: 0-No pain  Medications Reviewed Today     Reviewed by Jyron Turman M, RN (Registered Nurse) on 01/17/24 at 1415  Med List Status: <None>   Medication Order Taking? Sig Documenting Provider Last Dose Status Informant  albuterol  (PROVENTIL ) (2.5 MG/3ML) 0.083% nebulizer solution [id number] Yes Take 3 mLs (2.5 mg total) by nebulization every 4 (four) hours as needed for wheezing or shortness of breath. Hunsucker, Donnice SAUNDERS, MD  Active   albuterol  (VENTOLIN  HFA) 108 (90 Base) MCG/ACT inhaler [id number] Yes Inhale 2 puffs into the lungs every 6 (six) hours as needed for wheezing or shortness of breath. Hunsucker, Donnice SAUNDERS, MD  Active   ALPRAZolam  (XANAX ) 0.5 MG tablet [id number] Yes Take 0.5 mg by mouth daily as needed for anxiety. [provider]  Active Self           Med Note JACKOLYN, ZEA J   Thu Jun 02, 2023  9:50 AM)    budesonide -formoterol  (SYMBICORT ) 160-4.5 MCG/ACT inhaler [id number] Yes Inhale 2 puffs into the lungs 2 (two) times daily. Hunsucker, Donnice SAUNDERS, MD  Active   clobetasol  cream (TEMOVATE ) 0.05 % [id number]  Apply topically. [provider]  Active   Clobetasol  Prop  Emollient Base (CLOBETASOL  PROPIONATE E) 0.05 % emollient cream [id number]  Apply 1 Application topically 3 (three) times daily. Tid x 4 weeks, bid x 4 weeks, daily x 4 weeks, then 2x/week Fredirick Glenys RAMAN, MD  Active   estradiol  (ESTRACE ) 0.1 MG/GM vaginal cream [id number] Yes Place 0.5 g vaginally 2 (two) times a week. Place 0.5g nightly for two weeks then twice a week after Zuleta, Kaitlin G, NP  Active   fluticasone  (FLONASE ) 50 MCG/ACT nasal spray [id number] Yes Place 2 sprays into both nostrils daily. [provider]  Active Self  furosemide  (LASIX ) 20 MG tablet [id number] Yes Take 1 tablet (20 mg total) by mouth daily as needed for edema or fluid. Ladona Heinz, MD  Active   losartan  (COZAAR ) 25 MG tablet [id number] Yes Take 1 tablet (25 mg total) by mouth every morning. Ladona Heinz, MD  Active   methocarbamol  (ROBAXIN )  500 MG tablet [id number] Yes Take 500 mg by mouth every 6 (six) hours as needed for muscle spasms. [provider]  Active   metoprolol  succinate (TOPROL  XL) 25 MG 24 hr tablet [id number] Yes Take 1 tablet (25 mg total) by mouth daily. Ladona Heinz, MD  Active   nitroGLYCERIN  (NITROSTAT ) 0.4 MG SL tablet [id number] Yes DISSOLVE 1 TABLET UNDER THE TONGUE EVERY 5 MINUTES AS NEEDED FOR CHEST PAIN. DO NOT EXCEED A TOTAL OF 3 DOSES IN 15 MINUTES. CALL 911 IF NO RELIEF Fernand Denyse LABOR, MD  Active            Med Note JACKOLYN, EXIE JINNY Schaumann Jun 02, 2023  9:50 AM) prn  JESSE SCHLOSSMAN [id number] Yes Pt uses a cpap nightly [provider]  Active Self  pantoprazole  (PROTONIX ) 40 MG tablet [id number] Yes Take 40 mg by mouth daily. [provider]  Active Self  rosuvastatin  (CRESTOR ) 20 MG tablet [id number] Yes Take 1 tablet (20 mg total) by mouth every morning. Ladona Heinz, MD  Active   spironolactone  (ALDACTONE ) 25 MG tablet [id number] Yes Take 1 tablet (25 mg total) by mouth every morning. Ladona Heinz, MD  Active   verapamil  (CALAN -SR) 120 MG CR tablet [id number] Yes Take 1  tablet (120 mg total) by mouth at bedtime. Ladona Heinz, MD  Active   Vibegron  75 MG TABS [id number] Yes Take 1 tablet (75 mg total) by mouth daily. Zuleta, Kaitlin G, NP  Active   WEGOVY 1.7 MG/0.75ML EMMANUEL [id number] Yes Inject 1.7 mg into the skin. [provider]  Active           Recommendation:   Continue Current Plan of Care  Follow Up Plan:   Telephone follow up appointment date/time:  02/13/24 at 1:30 pm  Heddy Shutter, RN, MSN, BSN, CCM [place]  [facility], Population Health Case Manager Phone: [phone]

## 2024-01-24 ENCOUNTER — Other Ambulatory Visit: Payer: Self-pay

## 2024-01-24 NOTE — Patient Outreach (Signed)
 Complex Care Management   Visit Note  01/24/2024  Name:  Pamela Daniel MRN: [id number] DOB: Sep 18, 1950  Situation: Referral received for Complex Care Management related to Mental/Behavioral Health diagnosis anxiety and depression. I obtained verbal consent from Patient.  Visit completed with Patient  on the phone. LCSW met with patient for initial visit. LCSW discussed the results of mental health screenings that was completed last week. Patient is amenable to virtual therapy. LCSW encouraged patient to continue to utilize coping skills.  Background:   Past Medical History:  Diagnosis Date   Allergy    Anxiety    Arthritis    SPINE   Asthma    Blood transfusion without reported diagnosis    Fatty liver    Fatty pancreas    First degree heart block    Frequency of urination    GERD (gastroesophageal reflux disease)    Hemorrhoids    Hepatic steatosis    Hiatal hernia    POST RESIDUAL SMALL HH PER IMAGING   History of acute pancreatitis 03/16/2015   History of adenomatous polyp of colon    tubular adenoma's 04-08-2014/   hyperplastic bening polyp 2000   History of concussion 2012   no residual   History of kidney stones    History of transient ischemic attack (TIA) 12/22/2011   no residual   History of vaginal dysplasia 2016   Hypertension    Pancreatic cyst    Pseudocyst of pancreas    PSVT (paroxysmal supraventricular tachycardia)    cardioloigst-  dr ladona--- last visit 2013 per pt and is currently followed by pcp   Rectal bleeding    S/P AV (atrioventricular) nodal ablation 01-21-2010    dr fernande   Simple renal cyst    bilateral per imaging   Urgency of urination    Urinary frequency    Urinary leakage     Assessment: Patient Reported Symptoms:  Cognitive Cognitive Status: Normal speech and language skills, Alert and oriented to person, place, and time Cognitive/Intellectual Conditions Management [RPT]: None reported or documented in medical history or  problem list      Neurological Neurological Review of Symptoms: No symptoms reported    HEENT HEENT Symptoms Reported: No symptoms reported      Cardiovascular Cardiovascular Symptoms Reported: No symptoms reported    Respiratory Respiratory Symptoms Reported: No symptoms reported    Endocrine Endocrine Symptoms Reported: No symptoms reported Is patient diabetic?: No    Gastrointestinal Gastrointestinal Symptoms Reported: No symptoms reported      Genitourinary Genitourinary Symptoms Reported: No symptoms reported    Integumentary Integumentary Symptoms Reported: Other Other Integumentary Symptoms: Reports some sores at time, PCP is aware    Musculoskeletal Musculoskelatal Symptoms Reviewed: No symptoms reported Additional Musculoskeletal Details: Patient reports back pain is better   Falls in the past year?: Yes Number of falls in past year: 1 or less Was there an injury with Fall?: No Fall Risk Category Calculator: 1 Patient Fall Risk Level: Low Fall Risk Patient at Risk for Falls Due to: History of fall(s) Fall risk Follow up: Falls evaluation completed  Psychosocial Psychosocial Symptoms Reported: Anxiety - if selected complete GAD, Depression - if selected complete PHQ 2-9 (Patient assessments was completed less than a week ago) Additional Psychological Details: Patient still reports having anxiety and depression. Patient reports that anxiety is situational. Behavioral Management Strategies: Medication therapy, Coping strategies, Adequate rest Behavioral Health Comment: Patient reports that relaxing helps and utilizes mindfulness techniques Major Change/Loss/Stressor/Fears (CP):  Environment, Resources, Relationship concerns Behaviors When Feeling Stressed/Fearful: Patient reports having a fast heart beat when anxiety is high. Techniques to Cope with Loss/Stress/Change: Medication (Patient is seeking therapy) Quality of Family Relationships: helpful, involved,  stressful Do you feel physically threatened by others?: No    01/24/2024    PHQ2-9 Depression Screening   Little interest or pleasure in doing things    Feeling down, depressed, or hopeless    PHQ-2 - Total Score    Trouble falling or staying asleep, or sleeping too much    Feeling tired or having little energy    Poor appetite or overeating     Feeling bad about yourself - or that you are a failure or have let yourself or your family down    Trouble concentrating on things, such as reading the newspaper or watching television    Moving or speaking so slowly that other people could have noticed.  Or the opposite - being so fidgety or restless that you have been moving around a lot more than usual    Thoughts that you would be better off dead, or hurting yourself in some way    PHQ2-9 Total Score    If you checked off any problems, how difficult have these problems made it for you to do your work, take care of things at home, or get along with other people    Depression Interventions/Treatment      There were no vitals filed for this visit. Pain Score: 0-No pain  Medications Reviewed Today     Reviewed by Sherren Olam BRAVO, LCSW (Social Worker) on 01/24/24 at 1411  Med List Status: <None>   Medication Order Taking? Sig Documenting Provider Last Dose Status Informant  albuterol  (PROVENTIL ) (2.5 MG/3ML) 0.083% nebulizer solution [id number]  Take 3 mLs (2.5 mg total) by nebulization every 4 (four) hours as needed for wheezing or shortness of breath. Hunsucker, Donnice SAUNDERS, MD  Active   albuterol  (VENTOLIN  HFA) 108 (90 Base) MCG/ACT inhaler [id number]  Inhale 2 puffs into the lungs every 6 (six) hours as needed for wheezing or shortness of breath. Hunsucker, Donnice SAUNDERS, MD  Active   ALPRAZolam  (XANAX ) 0.5 MG tablet [id number]  Take 0.5 mg by mouth daily as needed for anxiety. [provider]  Active Self           Med Note JACKOLYN, ZEA J   Thu Jun 02, 2023  9:50 AM)     budesonide -formoterol  (SYMBICORT ) 160-4.5 MCG/ACT inhaler [id number]  Inhale 2 puffs into the lungs 2 (two) times daily. Hunsucker, Donnice SAUNDERS, MD  Active   clobetasol  cream (TEMOVATE ) 0.05 % [id number]  Apply topically. [provider]  Active   Clobetasol  Prop Emollient Base (CLOBETASOL  PROPIONATE E) 0.05 % emollient cream [id number]  Apply 1 Application topically 3 (three) times daily. Tid x 4 weeks, bid x 4 weeks, daily x 4 weeks, then 2x/week Fredirick Glenys RAMAN, MD  Active   estradiol  (ESTRACE ) 0.1 MG/GM vaginal cream [id number]  Place 0.5 g vaginally 2 (two) times a week. Place 0.5g nightly for two weeks then twice a week after Zuleta, Kaitlin G, NP  Active   fluticasone  (FLONASE ) 50 MCG/ACT nasal spray [id number]  Place 2 sprays into both nostrils daily. [provider]  Active Self  furosemide  (LASIX ) 20 MG tablet [id number]  Take 1 tablet (20 mg total) by mouth daily as needed for edema or fluid. Ladona Heinz, MD  Active   losartan  (COZAAR ) 25 MG tablet [id number]  Take  1 tablet (25 mg total) by mouth every morning. Ladona Heinz, MD  Active   methocarbamol  (ROBAXIN ) 500 MG tablet [id number]  Take 500 mg by mouth every 6 (six) hours as needed for muscle spasms. [provider]  Active   metoprolol  succinate (TOPROL  XL) 25 MG 24 hr tablet [id number]  Take 1 tablet (25 mg total) by mouth daily. Ladona Heinz, MD  Active   nitroGLYCERIN  (NITROSTAT ) 0.4 MG SL tablet [id number]  DISSOLVE 1 TABLET UNDER THE TONGUE EVERY 5 MINUTES AS NEEDED FOR CHEST PAIN. DO NOT EXCEED A TOTAL OF 3 DOSES IN 15 MINUTES. CALL 911 IF NO RELIEF Fernand Denyse LABOR, MD  Active            Med Note JACKOLYN, EXIE JINNY Schaumann Jun 02, 2023  9:50 AM) prn  JESSE SCHLOSSMAN [id number]  Pt uses a cpap nightly [provider]  Active Self  pantoprazole  (PROTONIX ) 40 MG tablet [id number]  Take 40 mg by mouth daily. [provider]  Active Self  rosuvastatin  (CRESTOR ) 20 MG tablet [id number]  Take 1 tablet (20 mg total)  by mouth every morning. Ladona Heinz, MD  Active   spironolactone  (ALDACTONE ) 25 MG tablet [id number]  Take 1 tablet (25 mg total) by mouth every morning. Ladona Heinz, MD  Active   verapamil  (CALAN -SR) 120 MG CR tablet [id number]  Take 1 tablet (120 mg total) by mouth at bedtime. Ladona Heinz, MD  Active   Vibegron  75 MG TABS [id number]  Take 1 tablet (75 mg total) by mouth daily. Zuleta, Kaitlin G, NP  Active   WEGOVY 1.7 MG/0.75ML EMMANUEL [id number]  Inject 1.7 mg into the skin. [provider]  Active             Recommendation:   Continue Current Plan of Care  Follow Up Plan:   Telephone follow-up 02/08/2024 at 2:00 PM  Olam Ally, MSW, LCSW [place]  [facility], [facility] Licensed Clinical Social Worker Direct Dial: [phone]

## 2024-01-24 NOTE — Patient Instructions (Signed)
 Visit Information  Thank you for taking time to visit with me today. Please don't hesitate to contact me if I can be of assistance to you before our next scheduled appointment.  Our next appointment is by telephone on 01/24/2024 at 2:00 PM Please call the care guide team at [phone] if you need to cancel or reschedule your appointment.   Following is a copy of your care plan:   Goals Addressed             This Visit's Progress    [facility] Social Work Care Plan       Problems:   Disease Management support and education needs related to Anxiety with Social Anxiety, and Depression: difficulty concentrating.  CSW Clinical Goal(s):   Over the next 90 days the Patient will work with Child Psychotherapist to address concerns related to Anxiety and depression as evidenced by getting linked to therapy.  Over next two weeks, patient will continue to utilize coping skills( mindfulness techniques and relaxing) and taking medication.  Interventions:  Mental Health:  Evaluation of current treatment plan related to Anxiety with Social Anxiety, and Depression: difficulty concentrating Active listening / Reflection utilized Behavioral Activation reviewed Patient reports no SI/HI thoughts/plan. Crisis Resource Education / information provided: LCSW provided 988 suicide line. Depression screen reviewed GAD 7 results reviewed that was completed last week. Discussed referral options to connect for ongoing therapy: Patient is agreeable to virtual therapy. LCSW will provide patient with a list of mental health providers. Emotional Support Provided Mindfulness or Relaxation training provided and encouraged patient to keep utilizing techniques Motivational Interviewing employed Solution focus techniques Discussed triggers for anxiety and depression. LCSW provided patient with anxiety educational material through my chart.   Patient Goals/Self-Care Activities:  Coordinate with LCSW to assist with anxiety  and depression..  Plan:   Telephone follow up appointment with care management team member scheduled for:  02/08/2024 at 2:00 PM.        Please call the Suicide and Crisis Lifeline: 988 if you are experiencing a Mental Health or Behavioral Health Crisis or need someone to talk to.  Patient verbalized understanding of Care plan and visit instructions communicated this visit  Olam Ally, MSW, LCSW [place]  [facility], [facility] Licensed Clinical Social Worker Direct Dial: [phone]

## 2024-01-30 ENCOUNTER — Encounter: Payer: Self-pay | Admitting: *Deleted

## 2024-02-03 ENCOUNTER — Other Ambulatory Visit: Payer: Self-pay | Admitting: Cardiology

## 2024-02-03 DIAGNOSIS — E7800 Pure hypercholesterolemia, unspecified: Secondary | ICD-10-CM

## 2024-02-03 DIAGNOSIS — I10 Essential (primary) hypertension: Secondary | ICD-10-CM

## 2024-02-06 NOTE — Telephone Encounter (Signed)
 Please refill 30 days and forward to PCP as I have made her PRN. Dr. Eddy Freeman

## 2024-02-06 NOTE — Telephone Encounter (Signed)
 CMP done 09/17/23. Lipid overdue  In accordance with refill protocols, please review and address the following requirements before this medication refill can be authorized:  Labs

## 2024-02-08 ENCOUNTER — Other Ambulatory Visit: Payer: Self-pay

## 2024-02-08 NOTE — Patient Instructions (Signed)
 Visit Information  Thank you for taking time to visit with me today. Please don't hesitate to contact me if I can be of assistance to you before our next scheduled appointment.  Your next care management appointment is by telephone on 02/22/2024 at 2:00 PM   Please call the care guide team at [phone] if you need to cancel, schedule, or reschedule an appointment.   Please call the Suicide and Crisis Lifeline: 988 if you are experiencing a Mental Health or Behavioral Health Crisis or need someone to talk to.  Olam Ally, MSW, LCSW [place]  [facility], [facility] Licensed Clinical Social Worker Direct Dial: [phone]

## 2024-02-08 NOTE — Patient Outreach (Signed)
 Complex Care Management   Visit Note  02/08/2024  Name:  Pamela Daniel MRN: [id number] DOB: February 28, 1950  Situation Referral received for Complex Care Management related to Mental/Behavioral Health diagnosis anxiety and depression. I obtained verbal consent from Patient.  Visit completed with Patient  on the phone. LCSW met with patient for scheduled visit. Patient reports the emotional well being has improved and it was evident through the mental health assessments. Background:   Past Medical History:  Diagnosis Date   Allergy    Anxiety    Arthritis    SPINE   Asthma    Blood transfusion without reported diagnosis    Fatty liver    Fatty pancreas    First degree heart block    Frequency of urination    GERD (gastroesophageal reflux disease)    Hemorrhoids    Hepatic steatosis    Hiatal hernia    POST RESIDUAL SMALL HH PER IMAGING   History of acute pancreatitis 03/16/2015   History of adenomatous polyp of colon    tubular adenoma's 04-08-2014/   hyperplastic bening polyp 2000   History of concussion 2012   no residual   History of kidney stones    History of transient ischemic attack (TIA) 12/22/2011   no residual   History of vaginal dysplasia 2016   Hypertension    Pancreatic cyst    Pseudocyst of pancreas    PSVT (paroxysmal supraventricular tachycardia)    cardioloigst-  dr ladona--- last visit 2013 per pt and is currently followed by pcp   Rectal bleeding    S/P AV (atrioventricular) nodal ablation 01-21-2010    dr fernande   Simple renal cyst    bilateral per imaging   Urgency of urination    Urinary frequency    Urinary leakage     Assessment: Patient Reported Symptoms:  Cognitive Cognitive Status: Normal speech and language skills, Alert and oriented to person, place, and time Cognitive/Intellectual Conditions Management [RPT]: None reported or documented in medical history or problem list      Neurological Neurological Review of Symptoms: Not assessed     HEENT HEENT Symptoms Reported: Not assessed      Cardiovascular Cardiovascular Symptoms Reported: Not assessed    Respiratory Respiratory Symptoms Reported: Not assesed    Endocrine Endocrine Symptoms Reported: Not assessed    Gastrointestinal Gastrointestinal Symptoms Reported: Not assessed      Genitourinary Genitourinary Symptoms Reported: Not assessed    Integumentary Integumentary Symptoms Reported: Not assessed    Musculoskeletal Musculoskelatal Symptoms Reviewed: Not assessed        Psychosocial Psychosocial Symptoms Reported: Anxiety - if selected complete GAD, Depression - if selected complete PHQ 2-9 Additional Psychological Details: Patient reports that anxiety and depression has lessened Behavioral Management Strategies: Medication therapy, Coping strategies, Adequate rest Behavioral Health Comment: Patient utilizes mindfulness techniques Major Change/Loss/Stressor/Fears (CP): Environment, Resources, Relationship concerns Behaviors When Feeling Stressed/Fearful: Patient still reports a fast heartbeat Techniques to Cope with Loss/Stress/Change: Medication Quality of Family Relationships: helpful, involved, stressful Do you feel physically threatened by others?: No    02/08/2024    PHQ2-9 Depression Screening   Little interest or pleasure in doing things Several days  Feeling down, depressed, or hopeless Several days  PHQ-2 - Total Score 2  Trouble falling or staying asleep, or sleeping too much Not at all  Feeling tired or having little energy Nearly every day  Poor appetite or overeating  Not at all  Feeling bad about yourself - or  that you are a failure or have let yourself or your family down Several days  Trouble concentrating on things, such as reading the newspaper or watching television Several days  Moving or speaking so slowly that other people could have noticed.  Or the opposite - being so fidgety or restless that you have been moving around a lot more  than usual Not at all  Thoughts that you would be better off dead, or hurting yourself in some way Not at all  PHQ2-9 Total Score 7  If you checked off any problems, how difficult have these problems made it for you to do your work, take care of things at home, or get along with other people Somewhat difficult  Depression Interventions/Treatment        02/08/2024    2:02 PM 01/17/2024    2:17 PM  GAD 7 : Generalized Anxiety Score  Nervous, Anxious, on Edge 1 3   Control/stop worrying 2 3   Worry too much - different things 2 3   Trouble relaxing 3 3   Restless 3 3   Easily annoyed or irritable 2 1   Afraid - awful might happen 0 1   Total GAD 7 Score 13 17  Anxiety Difficulty  Somewhat difficult     Data saved with a previous flowsheet row definition     There were no vitals filed for this visit.    Medications Reviewed Today     Reviewed by Sherren Olam FORBES KEN (Social Worker) on 02/08/24 at 1409  Med List Status: <None>   Medication Order Taking? Sig Documenting Provider Last Dose Status Informant  albuterol  (PROVENTIL ) (2.5 MG/3ML) 0.083% nebulizer solution [id number]  Take 3 mLs (2.5 mg total) by nebulization every 4 (four) hours as needed for wheezing or shortness of breath. Hunsucker, Donnice SAUNDERS, MD  Active   albuterol  (VENTOLIN  HFA) 108 (90 Base) MCG/ACT inhaler [id number]  Inhale 2 puffs into the lungs every 6 (six) hours as needed for wheezing or shortness of breath. Hunsucker, Donnice SAUNDERS, MD  Active   ALPRAZolam  (XANAX ) 0.5 MG tablet [id number]  Take 0.5 mg by mouth daily as needed for anxiety. [provider]  Active Self           Med Note JACKOLYN, ZEA J   Thu Jun 02, 2023  9:50 AM)    budesonide -formoterol  (SYMBICORT ) 160-4.5 MCG/ACT inhaler [id number]  Inhale 2 puffs into the lungs 2 (two) times daily. Hunsucker, Donnice SAUNDERS, MD  Active   clobetasol  cream (TEMOVATE ) 0.05 % [id number]  Apply topically. [provider]  Active   Clobetasol  Prop Emollient  Base (CLOBETASOL  PROPIONATE E) 0.05 % emollient cream [id number]  Apply 1 Application topically 3 (three) times daily. Tid x 4 weeks, bid x 4 weeks, daily x 4 weeks, then 2x/week Fredirick Glenys RAMAN, MD  Active   estradiol  (ESTRACE ) 0.1 MG/GM vaginal cream [id number]  Place 0.5 g vaginally 2 (two) times a week. Place 0.5g nightly for two weeks then twice a week after Zuleta, Kaitlin G, NP  Active   fluticasone  (FLONASE ) 50 MCG/ACT nasal spray [id number]  Place 2 sprays into both nostrils daily. [provider]  Active Self  furosemide  (LASIX ) 20 MG tablet [id number]  Take 1 tablet (20 mg total) by mouth daily as needed for edema or fluid. Ladona Heinz, MD  Active   losartan  (COZAAR ) 25 MG tablet [id number]  Take 1 tablet (25 mg total) by mouth every morning. Ladona Heinz, MD  Active  methocarbamol  (ROBAXIN ) 500 MG tablet [id number]  Take 500 mg by mouth every 6 (six) hours as needed for muscle spasms. [provider]  Active   metoprolol  succinate (TOPROL  XL) 25 MG 24 hr tablet [id number]  Take 1 tablet (25 mg total) by mouth daily. Ladona Heinz, MD  Active   nitroGLYCERIN  (NITROSTAT ) 0.4 MG SL tablet [id number]  DISSOLVE 1 TABLET UNDER THE TONGUE EVERY 5 MINUTES AS NEEDED FOR CHEST PAIN. DO NOT EXCEED A TOTAL OF 3 DOSES IN 15 MINUTES. CALL 911 IF NO RELIEF Fernand Denyse LABOR, MD  Active            Med Note JACKOLYN, EXIE JINNY Schaumann Jun 02, 2023  9:50 AM) prn  JESSE SCHLOSSMAN [id number]  Pt uses a cpap nightly [provider]  Active Self  pantoprazole  (PROTONIX ) 40 MG tablet [id number]  Take 40 mg by mouth daily. [provider]  Active Self  rosuvastatin  (CRESTOR ) 20 MG tablet [id number]  Take 1 tablet (20 mg total) by mouth every morning. Ladona Heinz, MD  Active   spironolactone  (ALDACTONE ) 25 MG tablet [id number]  TAKE 1 TABLET BY MOUTH ONCE DAILY IN THE ERMALINDA Ladona Heinz, MD  Active   verapamil  (CALAN -SR) 120 MG CR tablet [id number]  Take 1 tablet (120 mg total) by mouth at bedtime. Ladona Heinz, MD  Active   Vibegron  75 MG TABS [id number]  Take 1 tablet (75 mg total) by mouth daily. Zuleta, Kaitlin G, NP  Active   WEGOVY 1.7 MG/0.75ML EMMANUEL [id number]  Inject 1.7 mg into the skin. [provider]  Active             Recommendation:   Continue Current Plan of Care Patient will continue to utilize coping skills.  Follow Up Plan:   Telephone follow-up 02/22/2024 at 2:00 PM  Olam Ally, MSW, LCSW [place]  [facility], [facility] Licensed Clinical Social Worker Direct Dial: [phone]

## 2024-02-13 ENCOUNTER — Other Ambulatory Visit: Payer: Self-pay

## 2024-02-13 NOTE — Patient Outreach (Signed)
 Complex Care Management   Visit Note  02/13/2024  Name:  Pamela Daniel MRN: [id number] DOB: [birth year]  Situation: Referral received from [facility] CM for Complex Care Management related to resource needs I obtained verbal consent from Patient.  Visit completed with Patient  on the phone  Background:   Past Medical History:  Diagnosis Date   Allergy    Anxiety    Arthritis    SPINE   Asthma    Blood transfusion without reported diagnosis    Fatty liver    Fatty pancreas    First degree heart block    Frequency of urination    GERD (gastroesophageal reflux disease)    Hemorrhoids    Hepatic steatosis    Hiatal hernia    POST RESIDUAL SMALL HH PER IMAGING   History of acute pancreatitis 03/16/2015   History of adenomatous polyp of colon    tubular adenoma's 04-08-2014/   hyperplastic bening polyp 2000   History of concussion 2012   no residual   History of kidney stones    History of transient ischemic attack (TIA) 12/22/2011   no residual   History of vaginal dysplasia 2016   Hypertension    Pancreatic cyst    Pseudocyst of pancreas    PSVT (paroxysmal supraventricular tachycardia)    cardioloigst-  dr ladona--- last visit 2013 per pt and is currently followed by pcp   Rectal bleeding    S/P AV (atrioventricular) nodal ablation 01-21-2010    dr fernande   Simple renal cyst    bilateral per imaging   Urgency of urination    Urinary frequency    Urinary leakage     Assessment: Patient reports she is doing better. She denies any questions or concerns at this time. Patient Reported Symptoms:  Cognitive Cognitive Status: Alert and oriented to person, place, and time, No symptoms reported      Neurological Neurological Review of Symptoms: No symptoms reported    HEENT HEENT Symptoms Reported: No symptoms reported      Cardiovascular Cardiovascular Symptoms Reported: No symptoms reported    Respiratory Respiratory Symptoms Reported: No symptoms  reported Other Respiratory Symptoms: Per patient, that has been doing good.    Endocrine Endocrine Symptoms Reported: No symptoms reported Is patient diabetic?: No    Gastrointestinal Gastrointestinal Symptoms Reported: No symptoms reported      Genitourinary Genitourinary Symptoms Reported: No symptoms reported    Integumentary Integumentary Symptoms Reported: No symptoms reported    Musculoskeletal Musculoskelatal Symptoms Reviewed: Back pain Additional Musculoskeletal Details: patient reports back pain 2/10 controlled. But reports after increase activity like housework, sewing, back pain will increase. patient reports has a rollator walker, but only uses when she has to walk a distance.        Psychosocial Additional Psychological Details: Patient reports anxiety/depression doing much better. Continues to work with [employer].         There were no vitals filed for this visit. Pain Score: 0-No pain  Medications Reviewed Today     Reviewed by Caydyn Sprung M, RN (Registered Nurse) on 02/13/24 at 1352  Med List Status: <None>   Medication Order Taking? Sig Documenting Provider Last Dose Status Informant  albuterol  (PROVENTIL ) (2.5 MG/3ML) 0.083% nebulizer solution [id number] Yes Take 3 mLs (2.5 mg total) by nebulization every 4 (four) hours as needed for wheezing or shortness of breath. Hunsucker, Donnice SAUNDERS, MD  Active   albuterol  (VENTOLIN  HFA) 108 [phone] Base) MCG/ACT inhaler [id number] Yes Inhale  2 puffs into the lungs every 6 (six) hours as needed for wheezing or shortness of breath. Hunsucker, Donnice SAUNDERS, MD  Active   ALPRAZolam  (XANAX ) 0.5 MG tablet [id number] Yes Take 0.5 mg by mouth daily as needed for anxiety. [provider]  Active Self           Med Note JACKOLYN, ZEA J   Thu Jun 02, 2023  9:50 AM)    budesonide -formoterol  (SYMBICORT ) 160-4.5 MCG/ACT inhaler [id number] Yes Inhale 2 puffs into the lungs 2 (two) times daily. Hunsucker, Donnice SAUNDERS, MD  Active    clobetasol  cream (TEMOVATE ) 0.05 % [id number]  Apply topically. [provider]  Active   Clobetasol  Prop Emollient Base (CLOBETASOL  PROPIONATE E) 0.05 % emollient cream [id number] Yes Apply 1 Application topically 3 (three) times daily. Tid x 4 weeks, bid x 4 weeks, daily x 4 weeks, then 2x/week Fredirick Glenys RAMAN, MD  Active   estradiol  (ESTRACE ) 0.1 MG/GM vaginal cream [id number] Yes Place 0.5 g vaginally 2 (two) times a week. Place 0.5g nightly for two weeks then twice a week after Zuleta, Kaitlin G, NP  Active   fluticasone  (FLONASE ) 50 MCG/ACT nasal spray [id number] Yes Place 2 sprays into both nostrils daily. [provider]  Active Self  furosemide  (LASIX ) 20 MG tablet [id number] Yes Take 1 tablet (20 mg total) by mouth daily as needed for edema or fluid. Ladona Heinz, MD  Active   losartan  (COZAAR ) 25 MG tablet [id number] Yes Take 1 tablet (25 mg total) by mouth every morning. Ladona Heinz, MD  Active   methocarbamol  (ROBAXIN ) 500 MG tablet [id number] Yes Take 500 mg by mouth every 6 (six) hours as needed for muscle spasms. [provider]  Active   metoprolol  succinate (TOPROL  XL) 25 MG 24 hr tablet [id number] Yes Take 1 tablet (25 mg total) by mouth daily. Ladona Heinz, MD  Active   nitroGLYCERIN  (NITROSTAT ) 0.4 MG SL tablet [id number] Yes DISSOLVE 1 TABLET UNDER THE TONGUE EVERY 5 MINUTES AS NEEDED FOR CHEST PAIN. DO NOT EXCEED A TOTAL OF 3 DOSES IN 15 MINUTES. CALL 911 IF NO RELIEF Fernand Denyse LABOR, MD  Active            Med Note JACKOLYN, EXIE JINNY Schaumann Jun 02, 2023  9:50 AM) prn  JESSE SCHLOSSMAN [id number] Yes Pt uses a cpap nightly [provider]  Active Self  pantoprazole  (PROTONIX ) 40 MG tablet [id number] Yes Take 40 mg by mouth daily. [provider]  Active Self  rosuvastatin  (CRESTOR ) 20 MG tablet [id number] Yes Take 1 tablet (20 mg total) by mouth every morning. Ladona Heinz, MD  Active   spironolactone  (ALDACTONE ) 25 MG tablet [id number] Yes TAKE 1 TABLET BY  MOUTH ONCE DAILY IN THE ERMALINDA Ladona Heinz, MD  Active   verapamil  (CALAN -SR) 120 MG CR tablet [id number] Yes Take 1 tablet (120 mg total) by mouth at bedtime. Ladona Heinz, MD  Active   Vibegron  75 MG TABS [id number] Yes Take 1 tablet (75 mg total) by mouth daily. Zuleta, Kaitlin G, NP  Active   WEGOVY 1.7 MG/0.75ML EMMANUEL [id number] Yes Inject 1.7 mg into the skin. [provider]  Active           Recommendation:   PCP Follow-up  Follow Up Plan:   Telephone follow up appointment date/time:  02/13/24 at 1:30 pm  Heddy Shutter, RN, MSN, BSN, CCM [place]  [facility] Population Health Case Manager Phone: [phone]

## 2024-02-13 NOTE — Patient Instructions (Signed)
 Visit Information  Thank you for taking time to visit with me today. Please don't hesitate to contact me if I can be of assistance to you before our next scheduled appointment.  Your next care management appointment is by telephone on 03/15/24 at 1:30 pm  Please call the care guide team at [phone] if you need to cancel, schedule, or reschedule an appointment.   Please call the Suicide and Crisis Lifeline: 988 call the [place]  National Suicide Prevention Lifeline: (780) 198-9955 or TTY: (508)817-8197 TTY 540-824-6603) to talk to a trained counselor if you are experiencing a Mental Health or Behavioral Health Crisis or need someone to talk to.  Heddy Shutter, RN, MSN, BSN, CCM [place]  [facility], Population Health Case Manager Phone: [phone]

## 2024-02-22 ENCOUNTER — Other Ambulatory Visit: Payer: Self-pay

## 2024-02-22 NOTE — Patient Instructions (Signed)
 Visit Information  Thank you for taking time to visit with me today. Please don't hesitate to contact me if I can be of assistance to you before our next scheduled appointment.  Your next care management appointment is no further scheduled appointments.     Please call the care guide team at [phone] if you need to cancel, schedule, or reschedule an appointment.   Please call the Suicide and Crisis Lifeline: 988 if you are experiencing a Mental Health or Behavioral Health Crisis or need someone to talk to. Olam Ally, MSW, LCSW [place]  [facility], [facility] Licensed Clinical Social Worker Direct Dial: [phone]

## 2024-02-22 NOTE — Patient Outreach (Signed)
 Complex Care Management   Visit Note  02/22/2024  Name:  Pamela Daniel MRN: [id number] DOB: May 03, 1950  Situation: Referral received for Complex Care Management related to Mental/Behavioral Health diagnosis anxiety and depression. I obtained verbal consent from Patient.  Visit completed with Patient  on the phone. LCSW met with patient for scheduled visit. Patient reports the emotional well being has improved and patient is no longer seeking treatment for mental health needs. It was decided that LCSW will close out patient's care plan.  Background:   Past Medical History:  Diagnosis Date   Allergy    Anxiety    Arthritis    SPINE   Asthma    Blood transfusion without reported diagnosis    Fatty liver    Fatty pancreas    First degree heart block    Frequency of urination    GERD (gastroesophageal reflux disease)    Hemorrhoids    Hepatic steatosis    Hiatal hernia    POST RESIDUAL SMALL HH PER IMAGING   History of acute pancreatitis 03/16/2015   History of adenomatous polyp of colon    tubular adenoma's 04-08-2014/   hyperplastic bening polyp 2000   History of concussion 2012   no residual   History of kidney stones    History of transient ischemic attack (TIA) 12/22/2011   no residual   History of vaginal dysplasia 2016   Hypertension    Pancreatic cyst    Pseudocyst of pancreas    PSVT (paroxysmal supraventricular tachycardia)    cardioloigst-  dr ladona--- last visit 2013 per pt and is currently followed by pcp   Rectal bleeding    S/P AV (atrioventricular) nodal ablation 01-21-2010    dr fernande   Simple renal cyst    bilateral per imaging   Urgency of urination    Urinary frequency    Urinary leakage     Assessment: Patient Reported Symptoms:  Cognitive Cognitive Status: Normal speech and language skills, Alert and oriented to person, place, and time Cognitive/Intellectual Conditions Management [RPT]: None reported or documented in medical history or problem  list      Neurological Neurological Review of Symptoms: Not assessed    HEENT HEENT Symptoms Reported: Not assessed      Cardiovascular Cardiovascular Symptoms Reported: Not assessed    Respiratory Respiratory Symptoms Reported: Not assesed    Endocrine Endocrine Symptoms Reported: Not assessed    Gastrointestinal Gastrointestinal Symptoms Reported: Not assessed      Genitourinary Genitourinary Symptoms Reported: Not assessed    Integumentary Integumentary Symptoms Reported: Not assessed    Musculoskeletal Musculoskelatal Symptoms Reviewed: Not assessed        Psychosocial Psychosocial Symptoms Reported: Anxiety - if selected complete GAD, Depression - if selected complete PHQ 2-9 Additional Psychological Details: Patient reports anxiety/ depression is doing much, patient will continue to utilize coping skills Behavioral Management Strategies: Coping strategies, Adequate rest, Medication therapy Behavioral Health Comment: Patient is utlizing coping skills Major Change/Loss/Stressor/Fears (CP): Resources, Environment, Relationship concerns Techniques to Cope with Loss/Stress/Change: Medication Quality of Family Relationships: helpful, involved, stressful Do you feel physically threatened by others?: No    02/22/2024    PHQ2-9 Depression Screening   Little interest or pleasure in doing things    Feeling down, depressed, or hopeless    PHQ-2 - Total Score    Trouble falling or staying asleep, or sleeping too much    Feeling tired or having little energy    Poor appetite or overeating  Feeling bad about yourself - or that you are a failure or have let yourself or your family down    Trouble concentrating on things, such as reading the newspaper or watching television    Moving or speaking so slowly that other people could have noticed.  Or the opposite - being so fidgety or restless that you have been moving around a lot more than usual    Thoughts that you would be better  off dead, or hurting yourself in some way    PHQ2-9 Total Score    If you checked off any problems, how difficult have these problems made it for you to do your work, take care of things at home, or get along with other people    Depression Interventions/Treatment      There were no vitals filed for this visit.    Medications Reviewed Today   Medications were not reviewed in this encounter     Recommendation:   Continue Current Plan of Care Patient will continue to utilize coping skills and take medication.  Follow Up Plan:   Closing From:  Complex Care Management  Olam Ally, MSW, LCSW [place]  [facility], [facility] Licensed Clinical Social Worker Direct Dial: [phone]

## 2024-03-15 ENCOUNTER — Telehealth
